# Patient Record
Sex: Female | Born: 1940 | Race: White | Hispanic: No | Smoking: Former smoker
Health system: Southern US, Community
[De-identification: ages and names within clinical notes are randomized; demographics above are authoritative.]

## PROBLEM LIST (undated history)

## (undated) DIAGNOSIS — K259 Gastric ulcer, unspecified as acute or chronic, without hemorrhage or perforation: Secondary | ICD-10-CM

## (undated) DIAGNOSIS — Z973 Presence of spectacles and contact lenses: Secondary | ICD-10-CM

## (undated) DIAGNOSIS — F329 Major depressive disorder, single episode, unspecified: Secondary | ICD-10-CM

## (undated) DIAGNOSIS — R200 Anesthesia of skin: Secondary | ICD-10-CM

## (undated) DIAGNOSIS — H269 Unspecified cataract: Secondary | ICD-10-CM

## (undated) DIAGNOSIS — F419 Anxiety disorder, unspecified: Secondary | ICD-10-CM

## (undated) DIAGNOSIS — T7840XA Allergy, unspecified, initial encounter: Secondary | ICD-10-CM

## (undated) DIAGNOSIS — Z5189 Encounter for other specified aftercare: Secondary | ICD-10-CM

## (undated) DIAGNOSIS — R202 Paresthesia of skin: Secondary | ICD-10-CM

## (undated) DIAGNOSIS — IMO0001 Reserved for inherently not codable concepts without codable children: Secondary | ICD-10-CM

## (undated) DIAGNOSIS — D649 Anemia, unspecified: Secondary | ICD-10-CM

## (undated) DIAGNOSIS — E785 Hyperlipidemia, unspecified: Secondary | ICD-10-CM

## (undated) DIAGNOSIS — Z8701 Personal history of pneumonia (recurrent): Secondary | ICD-10-CM

## (undated) DIAGNOSIS — Z87442 Personal history of urinary calculi: Secondary | ICD-10-CM

## (undated) DIAGNOSIS — F32A Depression, unspecified: Secondary | ICD-10-CM

## (undated) DIAGNOSIS — Z8709 Personal history of other diseases of the respiratory system: Secondary | ICD-10-CM

## (undated) DIAGNOSIS — K219 Gastro-esophageal reflux disease without esophagitis: Secondary | ICD-10-CM

## (undated) DIAGNOSIS — M1990 Unspecified osteoarthritis, unspecified site: Secondary | ICD-10-CM

## (undated) DIAGNOSIS — K819 Cholecystitis, unspecified: Secondary | ICD-10-CM

## (undated) DIAGNOSIS — I714 Abdominal aortic aneurysm, without rupture, unspecified: Secondary | ICD-10-CM

## (undated) DIAGNOSIS — I10 Essential (primary) hypertension: Secondary | ICD-10-CM

## (undated) DIAGNOSIS — I2510 Atherosclerotic heart disease of native coronary artery without angina pectoris: Secondary | ICD-10-CM

## (undated) HISTORY — DX: Depression, unspecified: F32.A

## (undated) HISTORY — PX: COLONOSCOPY: SHX174

## (undated) HISTORY — PX: SHOULDER ARTHROSCOPY W/ ROTATOR CUFF REPAIR: SHX2400

## (undated) HISTORY — DX: Unspecified osteoarthritis, unspecified site: M19.90

## (undated) HISTORY — DX: Major depressive disorder, single episode, unspecified: F32.9

## (undated) HISTORY — DX: Allergy, unspecified, initial encounter: T78.40XA

## (undated) HISTORY — PX: UPPER GASTROINTESTINAL ENDOSCOPY: SHX188

## (undated) HISTORY — DX: Hyperlipidemia, unspecified: E78.5

## (undated) HISTORY — DX: Gastric ulcer, unspecified as acute or chronic, without hemorrhage or perforation: K25.9

## (undated) HISTORY — PX: OTHER SURGICAL HISTORY: SHX169

## (undated) HISTORY — DX: Unspecified cataract: H26.9

## (undated) HISTORY — DX: Encounter for other specified aftercare: Z51.89

## (undated) HISTORY — PX: EYE SURGERY: SHX253

## (undated) HISTORY — DX: Essential (primary) hypertension: I10

---

## 1999-03-18 ENCOUNTER — Inpatient Hospital Stay (HOSPITAL_COMMUNITY): Admission: AD | Admit: 1999-03-18 | Discharge: 1999-03-25 | Payer: Self-pay | Admitting: Family Medicine

## 1999-03-19 ENCOUNTER — Encounter: Payer: Self-pay | Admitting: Family Medicine

## 1999-03-21 ENCOUNTER — Encounter: Payer: Self-pay | Admitting: *Deleted

## 1999-03-26 ENCOUNTER — Encounter: Admission: RE | Admit: 1999-03-26 | Discharge: 1999-03-26 | Payer: Self-pay | Admitting: Family Medicine

## 1999-04-05 ENCOUNTER — Encounter: Admission: RE | Admit: 1999-04-05 | Discharge: 1999-04-05 | Payer: Self-pay | Admitting: Family Medicine

## 1999-12-06 ENCOUNTER — Encounter: Admission: RE | Admit: 1999-12-06 | Discharge: 1999-12-06 | Payer: Self-pay | Admitting: Family Medicine

## 2000-04-20 ENCOUNTER — Encounter: Admission: RE | Admit: 2000-04-20 | Discharge: 2000-04-20 | Payer: Self-pay | Admitting: Family Medicine

## 2000-05-11 ENCOUNTER — Encounter: Payer: Self-pay | Admitting: *Deleted

## 2000-05-11 ENCOUNTER — Encounter: Admission: RE | Admit: 2000-05-11 | Discharge: 2000-05-11 | Payer: Self-pay | Admitting: *Deleted

## 2000-05-12 ENCOUNTER — Encounter: Admission: RE | Admit: 2000-05-12 | Discharge: 2000-08-10 | Payer: Self-pay | Admitting: *Deleted

## 2000-06-01 ENCOUNTER — Ambulatory Visit (HOSPITAL_COMMUNITY): Admission: RE | Admit: 2000-06-01 | Discharge: 2000-06-01 | Payer: Self-pay | Admitting: Gastroenterology

## 2000-06-01 ENCOUNTER — Encounter (INDEPENDENT_AMBULATORY_CARE_PROVIDER_SITE_OTHER): Payer: Self-pay | Admitting: *Deleted

## 2000-06-16 ENCOUNTER — Encounter: Admission: RE | Admit: 2000-06-16 | Discharge: 2000-06-16 | Payer: Self-pay | Admitting: Sports Medicine

## 2000-09-09 ENCOUNTER — Encounter: Admission: RE | Admit: 2000-09-09 | Discharge: 2000-09-09 | Payer: Self-pay | Admitting: Family Medicine

## 2000-12-31 ENCOUNTER — Encounter: Admission: RE | Admit: 2000-12-31 | Discharge: 2000-12-31 | Payer: Self-pay | Admitting: Family Medicine

## 2001-01-07 ENCOUNTER — Encounter: Admission: RE | Admit: 2001-01-07 | Discharge: 2001-01-07 | Payer: Self-pay | Admitting: Family Medicine

## 2001-02-08 ENCOUNTER — Encounter: Admission: RE | Admit: 2001-02-08 | Discharge: 2001-02-08 | Payer: Self-pay | Admitting: Family Medicine

## 2001-02-12 ENCOUNTER — Encounter: Admission: RE | Admit: 2001-02-12 | Discharge: 2001-02-12 | Payer: Self-pay | Admitting: Family Medicine

## 2001-03-29 ENCOUNTER — Encounter: Admission: RE | Admit: 2001-03-29 | Discharge: 2001-03-29 | Payer: Self-pay | Admitting: Family Medicine

## 2001-04-30 ENCOUNTER — Encounter: Admission: RE | Admit: 2001-04-30 | Discharge: 2001-04-30 | Payer: Self-pay | Admitting: Family Medicine

## 2001-05-14 ENCOUNTER — Encounter: Admission: RE | Admit: 2001-05-14 | Discharge: 2001-05-14 | Payer: Self-pay | Admitting: Family Medicine

## 2001-06-22 ENCOUNTER — Other Ambulatory Visit: Admission: RE | Admit: 2001-06-22 | Discharge: 2001-06-22 | Payer: Self-pay | Admitting: Obstetrics and Gynecology

## 2001-06-25 ENCOUNTER — Encounter (INDEPENDENT_AMBULATORY_CARE_PROVIDER_SITE_OTHER): Payer: Self-pay | Admitting: Specialist

## 2001-06-25 ENCOUNTER — Ambulatory Visit (HOSPITAL_COMMUNITY): Admission: RE | Admit: 2001-06-25 | Discharge: 2001-06-25 | Payer: Self-pay | Admitting: Gastroenterology

## 2001-10-04 ENCOUNTER — Encounter: Admission: RE | Admit: 2001-10-04 | Discharge: 2001-10-04 | Payer: Self-pay | Admitting: Family Medicine

## 2002-02-23 ENCOUNTER — Encounter: Admission: RE | Admit: 2002-02-23 | Discharge: 2002-02-23 | Payer: Self-pay | Admitting: Family Medicine

## 2002-06-14 ENCOUNTER — Encounter: Admission: RE | Admit: 2002-06-14 | Discharge: 2002-06-14 | Payer: Self-pay | Admitting: Family Medicine

## 2002-08-02 ENCOUNTER — Encounter: Admission: RE | Admit: 2002-08-02 | Discharge: 2002-08-02 | Payer: Self-pay | Admitting: Sports Medicine

## 2002-08-02 ENCOUNTER — Encounter: Payer: Self-pay | Admitting: Sports Medicine

## 2002-08-18 ENCOUNTER — Encounter: Admission: RE | Admit: 2002-08-18 | Discharge: 2002-08-18 | Payer: Self-pay | Admitting: Family Medicine

## 2003-02-13 ENCOUNTER — Encounter: Admission: RE | Admit: 2003-02-13 | Discharge: 2003-02-13 | Payer: Self-pay | Admitting: Family Medicine

## 2003-03-06 ENCOUNTER — Encounter: Admission: RE | Admit: 2003-03-06 | Discharge: 2003-03-06 | Payer: Self-pay | Admitting: Family Medicine

## 2003-07-06 ENCOUNTER — Encounter: Admission: RE | Admit: 2003-07-06 | Discharge: 2003-07-06 | Payer: Self-pay | Admitting: Family Medicine

## 2003-11-30 ENCOUNTER — Encounter: Admission: RE | Admit: 2003-11-30 | Discharge: 2003-11-30 | Payer: Self-pay | Admitting: Family Medicine

## 2003-11-30 ENCOUNTER — Ambulatory Visit (HOSPITAL_COMMUNITY): Admission: RE | Admit: 2003-11-30 | Discharge: 2003-11-30 | Payer: Self-pay | Admitting: Family Medicine

## 2004-02-07 ENCOUNTER — Encounter: Admission: RE | Admit: 2004-02-07 | Discharge: 2004-02-07 | Payer: Self-pay | Admitting: Sports Medicine

## 2004-02-21 ENCOUNTER — Encounter: Admission: RE | Admit: 2004-02-21 | Discharge: 2004-02-21 | Payer: Self-pay | Admitting: Family Medicine

## 2004-06-21 ENCOUNTER — Encounter: Admission: RE | Admit: 2004-06-21 | Discharge: 2004-06-21 | Payer: Self-pay | Admitting: Family Medicine

## 2004-06-24 ENCOUNTER — Encounter: Admission: RE | Admit: 2004-06-24 | Discharge: 2004-06-24 | Payer: Self-pay | Admitting: Family Medicine

## 2004-10-16 ENCOUNTER — Ambulatory Visit: Payer: Self-pay | Admitting: Family Medicine

## 2004-10-31 ENCOUNTER — Ambulatory Visit: Payer: Self-pay | Admitting: Family Medicine

## 2005-02-27 ENCOUNTER — Ambulatory Visit: Payer: Self-pay | Admitting: Family Medicine

## 2005-03-31 ENCOUNTER — Encounter (INDEPENDENT_AMBULATORY_CARE_PROVIDER_SITE_OTHER): Payer: Self-pay | Admitting: *Deleted

## 2005-03-31 LAB — CONVERTED CEMR LAB

## 2005-04-18 ENCOUNTER — Ambulatory Visit: Payer: Self-pay | Admitting: Family Medicine

## 2005-05-05 ENCOUNTER — Encounter: Admission: RE | Admit: 2005-05-05 | Discharge: 2005-05-05 | Payer: Self-pay | Admitting: Sports Medicine

## 2005-06-17 ENCOUNTER — Ambulatory Visit: Payer: Self-pay | Admitting: Family Medicine

## 2006-02-26 ENCOUNTER — Ambulatory Visit: Payer: Self-pay | Admitting: Family Medicine

## 2006-03-18 ENCOUNTER — Ambulatory Visit: Payer: Self-pay | Admitting: Family Medicine

## 2006-11-17 ENCOUNTER — Ambulatory Visit: Payer: Self-pay | Admitting: Sports Medicine

## 2007-01-29 ENCOUNTER — Encounter (INDEPENDENT_AMBULATORY_CARE_PROVIDER_SITE_OTHER): Payer: Self-pay | Admitting: *Deleted

## 2007-03-09 ENCOUNTER — Encounter (INDEPENDENT_AMBULATORY_CARE_PROVIDER_SITE_OTHER): Payer: Self-pay | Admitting: *Deleted

## 2007-03-09 ENCOUNTER — Ambulatory Visit: Payer: Self-pay | Admitting: Family Medicine

## 2007-03-09 DIAGNOSIS — I10 Essential (primary) hypertension: Secondary | ICD-10-CM | POA: Insufficient documentation

## 2007-03-09 DIAGNOSIS — E782 Mixed hyperlipidemia: Secondary | ICD-10-CM | POA: Insufficient documentation

## 2007-04-01 LAB — CONVERTED CEMR LAB
Albumin: 4.7 g/dL (ref 3.5–5.2)
Alkaline Phosphatase: 91 units/L (ref 39–117)
CO2: 22 meq/L (ref 19–32)
Chloride: 102 meq/L (ref 96–112)
LDL Cholesterol: 124 mg/dL — ABNORMAL HIGH (ref 0–99)
Sodium: 138 meq/L (ref 135–145)
VLDL: 59 mg/dL — ABNORMAL HIGH (ref 0–40)

## 2007-08-30 ENCOUNTER — Encounter (INDEPENDENT_AMBULATORY_CARE_PROVIDER_SITE_OTHER): Payer: Self-pay | Admitting: Family Medicine

## 2007-08-30 ENCOUNTER — Ambulatory Visit: Payer: Self-pay | Admitting: Sports Medicine

## 2007-08-30 DIAGNOSIS — K5730 Diverticulosis of large intestine without perforation or abscess without bleeding: Secondary | ICD-10-CM | POA: Insufficient documentation

## 2007-08-30 DIAGNOSIS — K219 Gastro-esophageal reflux disease without esophagitis: Secondary | ICD-10-CM | POA: Insufficient documentation

## 2007-08-30 DIAGNOSIS — G4700 Insomnia, unspecified: Secondary | ICD-10-CM | POA: Insufficient documentation

## 2007-08-30 DIAGNOSIS — F329 Major depressive disorder, single episode, unspecified: Secondary | ICD-10-CM | POA: Insufficient documentation

## 2007-08-31 LAB — CONVERTED CEMR LAB
Alkaline Phosphatase: 102 units/L (ref 39–117)
BUN: 13 mg/dL (ref 6–23)
CO2: 25 meq/L (ref 19–32)
Calcium: 9.6 mg/dL (ref 8.4–10.5)
Chloride: 103 meq/L (ref 96–112)
Creatinine, Ser: 0.59 mg/dL (ref 0.40–1.20)
Glucose, Bld: 85 mg/dL (ref 70–99)
Potassium: 4.1 meq/L (ref 3.5–5.3)
Sodium: 139 meq/L (ref 135–145)
Triglycerides: 428 mg/dL — ABNORMAL HIGH (ref <150)

## 2007-09-22 ENCOUNTER — Encounter (INDEPENDENT_AMBULATORY_CARE_PROVIDER_SITE_OTHER): Payer: Self-pay | Admitting: Family Medicine

## 2007-10-12 ENCOUNTER — Telehealth: Payer: Self-pay | Admitting: *Deleted

## 2007-10-13 ENCOUNTER — Ambulatory Visit: Payer: Self-pay | Admitting: Family Medicine

## 2007-11-08 ENCOUNTER — Ambulatory Visit: Payer: Self-pay | Admitting: Family Medicine

## 2007-12-08 ENCOUNTER — Ambulatory Visit: Payer: Self-pay | Admitting: Family Medicine

## 2007-12-08 ENCOUNTER — Encounter (INDEPENDENT_AMBULATORY_CARE_PROVIDER_SITE_OTHER): Payer: Self-pay | Admitting: Family Medicine

## 2007-12-08 DIAGNOSIS — M899 Disorder of bone, unspecified: Secondary | ICD-10-CM | POA: Insufficient documentation

## 2007-12-08 DIAGNOSIS — M949 Disorder of cartilage, unspecified: Secondary | ICD-10-CM

## 2007-12-09 LAB — CONVERTED CEMR LAB
Albumin: 4.6 g/dL (ref 3.5–5.2)
Alkaline Phosphatase: 77 units/L (ref 39–117)
BUN: 16 mg/dL (ref 6–23)
CO2: 25 meq/L (ref 19–32)
Calcium: 9.7 mg/dL (ref 8.4–10.5)
Chloride: 103 meq/L (ref 96–112)
Creatinine, Ser: 0.64 mg/dL (ref 0.40–1.20)
Sodium: 139 meq/L (ref 135–145)
Total Protein: 7.2 g/dL (ref 6.0–8.3)
Triglycerides: 212 mg/dL — ABNORMAL HIGH (ref <150)

## 2007-12-23 ENCOUNTER — Telehealth (INDEPENDENT_AMBULATORY_CARE_PROVIDER_SITE_OTHER): Payer: Self-pay | Admitting: Family Medicine

## 2007-12-29 ENCOUNTER — Encounter: Admission: RE | Admit: 2007-12-29 | Discharge: 2007-12-29 | Payer: Self-pay | Admitting: Internal Medicine

## 2008-01-01 ENCOUNTER — Encounter (INDEPENDENT_AMBULATORY_CARE_PROVIDER_SITE_OTHER): Payer: Self-pay | Admitting: Family Medicine

## 2008-01-20 ENCOUNTER — Encounter (INDEPENDENT_AMBULATORY_CARE_PROVIDER_SITE_OTHER): Payer: Self-pay | Admitting: Family Medicine

## 2008-02-07 ENCOUNTER — Ambulatory Visit: Payer: Self-pay | Admitting: Sports Medicine

## 2008-05-29 ENCOUNTER — Encounter (INDEPENDENT_AMBULATORY_CARE_PROVIDER_SITE_OTHER): Payer: Self-pay | Admitting: Family Medicine

## 2008-05-29 ENCOUNTER — Ambulatory Visit: Payer: Self-pay | Admitting: Family Medicine

## 2008-05-30 LAB — CONVERTED CEMR LAB
ALT: 14 units/L (ref 0–35)
AST: 19 units/L (ref 0–37)
Albumin: 4.5 g/dL (ref 3.5–5.2)
CO2: 26 meq/L (ref 19–32)
Creatinine, Ser: 0.54 mg/dL (ref 0.40–1.20)
LDL Cholesterol: 176 mg/dL — ABNORMAL HIGH (ref 0–99)
Sodium: 139 meq/L (ref 135–145)
Total CHOL/HDL Ratio: 6.2
Total Protein: 7.4 g/dL (ref 6.0–8.3)
Triglycerides: 295 mg/dL — ABNORMAL HIGH (ref <150)
VLDL: 59 mg/dL — ABNORMAL HIGH (ref 0–40)

## 2008-07-24 ENCOUNTER — Telehealth: Payer: Self-pay | Admitting: *Deleted

## 2008-07-24 ENCOUNTER — Ambulatory Visit: Payer: Self-pay | Admitting: Sports Medicine

## 2008-08-30 ENCOUNTER — Telehealth (INDEPENDENT_AMBULATORY_CARE_PROVIDER_SITE_OTHER): Payer: Self-pay | Admitting: Family Medicine

## 2008-09-18 ENCOUNTER — Telehealth (INDEPENDENT_AMBULATORY_CARE_PROVIDER_SITE_OTHER): Payer: Self-pay | Admitting: *Deleted

## 2009-03-23 ENCOUNTER — Encounter (INDEPENDENT_AMBULATORY_CARE_PROVIDER_SITE_OTHER): Payer: Self-pay | Admitting: Family Medicine

## 2009-03-23 ENCOUNTER — Ambulatory Visit: Payer: Self-pay | Admitting: Family Medicine

## 2009-03-23 LAB — CONVERTED CEMR LAB
AST: 20 units/L (ref 0–37)
Albumin: 4 g/dL (ref 3.5–5.2)
BUN: 17 mg/dL (ref 6–23)
Chloride: 104 meq/L (ref 96–112)
Creatinine, Ser: 0.56 mg/dL (ref 0.40–1.20)
Total Bilirubin: 0.2 mg/dL — ABNORMAL LOW (ref 0.3–1.2)

## 2009-03-26 ENCOUNTER — Encounter (INDEPENDENT_AMBULATORY_CARE_PROVIDER_SITE_OTHER): Payer: Self-pay | Admitting: Family Medicine

## 2009-04-04 ENCOUNTER — Encounter (INDEPENDENT_AMBULATORY_CARE_PROVIDER_SITE_OTHER): Payer: Self-pay | Admitting: Family Medicine

## 2009-04-09 ENCOUNTER — Encounter (INDEPENDENT_AMBULATORY_CARE_PROVIDER_SITE_OTHER): Payer: Self-pay | Admitting: Family Medicine

## 2009-08-13 ENCOUNTER — Ambulatory Visit: Payer: Self-pay | Admitting: Family Medicine

## 2009-10-23 ENCOUNTER — Ambulatory Visit: Payer: Self-pay | Admitting: Family Medicine

## 2009-10-24 ENCOUNTER — Telehealth: Payer: Self-pay | Admitting: Family Medicine

## 2009-10-29 ENCOUNTER — Ambulatory Visit: Payer: Self-pay | Admitting: Family Medicine

## 2009-10-29 LAB — CONVERTED CEMR LAB: Rapid Strep: NEGATIVE

## 2010-01-30 ENCOUNTER — Ambulatory Visit: Payer: Self-pay | Admitting: Family Medicine

## 2010-01-30 DIAGNOSIS — M1990 Unspecified osteoarthritis, unspecified site: Secondary | ICD-10-CM | POA: Insufficient documentation

## 2010-07-25 ENCOUNTER — Ambulatory Visit: Payer: Self-pay | Admitting: Family Medicine

## 2010-07-25 ENCOUNTER — Telehealth: Payer: Self-pay | Admitting: *Deleted

## 2010-07-25 ENCOUNTER — Encounter: Payer: Self-pay | Admitting: Family Medicine

## 2010-07-25 LAB — CONVERTED CEMR LAB
ALT: 13 units/L (ref 0–35)
AST: 28 units/L (ref 0–37)
Albumin: 4.5 g/dL (ref 3.5–5.2)
Alkaline Phosphatase: 79 units/L (ref 39–117)
BUN: 16 mg/dL (ref 6–23)
CO2: 24 meq/L (ref 19–32)
Calcium: 8.9 mg/dL (ref 8.4–10.5)
Chloride: 103 meq/L (ref 96–112)
Cholesterol: 202 mg/dL — ABNORMAL HIGH (ref 0–200)
Creatinine, Ser: 0.6 mg/dL (ref 0.40–1.20)
Glucose, Bld: 89 mg/dL (ref 70–99)
HDL: 43 mg/dL (ref >39)
LDL Cholesterol: 117 mg/dL — ABNORMAL HIGH (ref 0–99)
Potassium: 4.4 meq/L (ref 3.5–5.3)
Sodium: 141 meq/L (ref 135–145)
TSH: 2.632 microintl units/mL (ref 0.350–4.500)
Total Bilirubin: 0.3 mg/dL (ref 0.3–1.2)
Total CHOL/HDL Ratio: 4.7
Total Protein: 7 g/dL (ref 6.0–8.3)
Triglycerides: 211 mg/dL — ABNORMAL HIGH (ref <150)
VLDL: 42 mg/dL — ABNORMAL HIGH (ref 0–40)
Vit D, 25-Hydroxy: 39 ng/mL (ref 30–89)

## 2010-07-28 ENCOUNTER — Encounter: Payer: Self-pay | Admitting: Family Medicine

## 2010-11-10 ENCOUNTER — Telehealth: Payer: Self-pay | Admitting: Family Medicine

## 2010-12-31 NOTE — Assessment & Plan Note (Signed)
 Summary: not any better,df    Vital Signs:  Patient profile:   70 year old female Weight:      147 pounds Temp:     98 degrees F oral Pulse rate:   97 / minute Pulse rhythm:   regular BP sitting:   173 / 87  (right arm) Cuff size:   regular  Vitals Entered By: Bascom Pica CMA (October 29, 2009 9:04 AM) CC: throat pain x 1 week,    Primary Care Provider:  Geni Shutter DO  CC:  throat pain x 1 week and .  History of Present Illness: ST x 1 week, cough, greenish mucus, no itchy eyes, no ear pain, no changes in hearing, no pain over sinuses, no fevers, generalized malaise, body aches, able to tolerate by mouth fluids.  Tearful, worried she is going to die.  No SOB, no wheeze, no chills.  No N/V/D/C.  Requesting antibiotics.  No neck pain/stiffness, no visual changes.  Current Medications (verified): 1)  Zoloft  50 Mg  Tabs (Sertraline  Hcl) .SABRA.. 1 Tablet By Mouth Daily 2)  Prilosec 20 Mg  Cpdr (Omeprazole ) .SABRA.. 1 Tablet By Mouth Two Times A Day As Needed 3)  Hydrochlorothiazide  25 Mg  Tabs (Hydrochlorothiazide ) .SABRA.. 1 Tablet By Mouth Daily 4)  Enalapril  Maleate 5 Mg  Tabs (Enalapril  Maleate) .SABRA.. 1 Tablet By Mouth Daily 5)  Bayer Low Strength 81 Mg  Tbec (Aspirin ) .SABRA.. 1 Tablet By Mouth Daily 6)  Trazodone  Hcl 100 Mg  Tabs (Trazodone  Hcl) .SABRA.. 1 Tablet By Mouth At Bedtime As Needed For Sleep 7)  Simvastatin  80 Mg  Tabs (Simvastatin ) .SABRA.. 1 Tablet By Mouth Daily 8)  Zoloft  100 Mg Tabs (Sertraline  Hcl) .... Take 1 Tablet By Mouth Daily With 50 Mg Tablet= 150mg  9)  Ativan  0.5 Mg Tabs (Lorazepam ) .... 1/2 To 1 Tablet By Mouth Daily As Needed For Anxiety 10)  Proair  Hfa 108 (90 Base) Mcg/act Aers (Albuterol  Sulfate) .... Two Puffs Qid As Needed For Cough and Wheeze 11)  Mucinex  D [phone] Mg Xr12h-Tab (Pseudoephedrine-Guaifenesin ) .... One Tab By Mouth Bid 12)  Tessalon  Perles 100 Mg Caps (Benzonatate ) .... One Tab Po Tid 13)  Tylenol  8 Hour 650 Mg Cr-Tabs (Acetaminophen ) .... 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  No longer working (quit early 2007).  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 30 pack year smoking history; quit 4/00. No etoh or drug use.  4 children who all live in the area; 2 grandchildren.  Son in [place], [place] in halfway house.  Review of Systems       See HPI  Physical Exam  General:  Well-developed,well-nourished,in no acute distress; alert,appropriate and cooperative throughout examination Head:  Normocephalic and atraumatic without obvious abnormalities. Eyes:  No corneal or conjunctival inflammation noted. EOMI. Perrla. Ears:  External ear exam shows no significant lesions or deformities.  Otoscopic examination reveals  clear canals, tympanic membranes are intact bilaterally without bulging, retraction, inflammation or discharge. Hearing is grossly normal bilaterally. Nose:  External nasal examination shows no deformity or inflammation. Nasal mucosa are mildly erythematous and moist without lesions or exudates. Mouth:  Oral mucosa and oropharynx without lesions or exudates.  Dentures removed prior to exam. Neck:  No deformities, masses, or tenderness noted. Chest Wall:  No deformities, masses, or tenderness noted. Lungs:  Normal respiratory effort, chest  expands symmetrically. Lungs are clear to auscultation, no crackles or wheezes. Heart:  Normal rate and regular rhythm. S1 and S2 normal without gallop, murmur, click, rub or other extra sounds. Abdomen:  Bowel sounds positive,abdomen soft and non-tender without masses, organomegaly or hernias noted.   Impression & Recommendations:  Problem # 1:  SORE THROAT (ICD-462) Assessment New Viral syndrome, strep neg, Tylenol , tessalon  perles, mucinex  D short course.  Hydration.  RTC if no better in 2 weeks.  Her updated medication list for this problem includes:    Tylenol  8 Hour 650 Mg Cr-tabs (Acetaminophen ) ..... One tab by mouth q8h as needed for discomfort  Orders: Rapid Strep-[facility] (12569) [facility]- Est Level  3 (00786)  Her updated medication list for this problem includes:    Bayer Low Strength 81 Mg Tbec (Aspirin ) .SABRA... 1 tablet by mouth daily    Tylenol  8 Hour 650 Mg Cr-tabs (Acetaminophen ) ..... One tab by mouth q8h as needed for discomfort  Complete Medication List: 1)  Zoloft  50 Mg Tabs (Sertraline  hcl) .SABRA.. 1 tablet by mouth daily 2)  Prilosec 20 Mg Cpdr (Omeprazole ) .SABRA.. 1 tablet by mouth two times a day as needed 3)  Hydrochlorothiazide  25 Mg Tabs (Hydrochlorothiazide ) .SABRA.. 1 tablet by mouth daily 4)  Enalapril  Maleate 5 Mg Tabs (Enalapril  maleate) .SABRA.. 1 tablet by mouth daily 5)  Bayer Low Strength 81 Mg Tbec (Aspirin ) .SABRA.. 1 tablet by mouth daily 6)  Trazodone  Hcl 100 Mg Tabs (Trazodone  hcl) .SABRA.. 1 tablet by mouth at bedtime as needed for sleep 7)  Simvastatin  80 Mg Tabs (Simvastatin ) .SABRA.. 1 tablet by mouth daily 8)  Zoloft  100 Mg Tabs (Sertraline  hcl) .... Take 1 tablet by mouth daily with 50 mg tablet= 150mg  9)  Ativan  0.5 Mg Tabs (Lorazepam ) .... 1/2 to 1 tablet by mouth daily as needed for anxiety 10)  Proair  Hfa 108 (90 Base) Mcg/act Aers (Albuterol  sulfate) .... Two puffs qid as needed for cough and wheeze 11)  Mucinex  D [phone] Mg Xr12h-tab (Pseudoephedrine-guaifenesin )  .... One tab by mouth bid 12)  Tessalon  Perles 100 Mg Caps (Benzonatate ) .... One tab po tid 13)  Tylenol  8 Hour 650 Mg Cr-tabs (Acetaminophen ) .... One tab by mouth q8h as needed for discomfort  Patient Instructions: 1)  Great to meet you today, 2)  You do not have strep throat, you have a viral infection, these can take 2-3 weeks to completely resolve.  Keep yourself well hydrated and take the prescribed medications.  Come back if you become unable to take fluids, vomiting, diarrhea. 3)  -Dr. ONEIDA. Prescriptions: TYLENOL  8 HOUR 650 MG CR-TABS (ACETAMINOPHEN ) One tab by mouth q8h as needed for discomfort  #30 x 0   Entered and Authorized by:   Debby Petties MD   Signed by:   Debby Petties MD on 10/29/2009   Method used:   Electronically to        [facility] [phone]* (retail)       [address]  [place], [place]  [address]       Ph: [phone]       Fax: [phone]   RxID:   [id number] TESSALON  PERLES 100 MG CAPS (BENZONATATE ) One tab Po TID  #30 x 0   Entered and Authorized by:   Debby Petties MD   Signed by:   Debby Petties MD on 10/29/2009   Method used:   Electronically to        [facility] [phone]* (retail)       [address]       [place], [place]  [address]       Ph: [phone]       Fax: [phone]   RxID:   [id number] MUCINEX  D [phone] MG XR12H-TAB (PSEUDOEPHEDRINE-GUAIFENESIN ) One tab by mouth BID  #20 x 0   Entered and Authorized by:   Debby Petties MD   Signed by:   Debby Petties MD on 10/29/2009   Method used:   Electronically to        [facility] [phone]* (retail)       [address]       [place], [place]  [address]       Ph: [phone]       Fax: [phone]   RxID:   [id number]   Laboratory Results  Date/Time Received: October 29, 2009 9:08 AM  Date/Time Reported: October 29, 2009 9:25 AM   Other Tests  Rapid Strep: negative Comments: ...........test performed  by...........SABRAArland Morel, CMA     Prevention & Chronic Care Immunizations   Influenza vaccine: Fluvax 3+  (08/30/2007)   Influenza vaccine due: 08/29/2008    Tetanus booster: 10/01/2001: Done.   Tetanus booster due: 10/02/2011    Pneumococcal vaccine: Pneumovax (Medicare)  (12/08/2007)   Pneumococcal vaccine due: None    H. zoster vaccine: Not documented  Colorectal Screening   Hemoccult: negative x 3  (09/22/2007)   Hemoccult due: Not Indicated    Colonoscopy: Done.  (05/06/2005)   Colonoscopy due: 05/07/2015  Other Screening   Pap smear: Done.  (03/31/2005)   Pap smear due: 03/31/2006    Mammogram: No specific mammographic evidence of malignancy.  Assessment: BIRADS 1.   (01/01/2008)   Mammogram action/deferral: Screening mammogram in 1 year.     (01/01/2008)   Mammogram due: 12/31/2008    DXA bone density scan: Hip Total: T Score -2.5 to -1.0 Hip.    Location:  [facility].      (12/29/2007)   DXA scan due: 01/2010    Smoking status: quit  (10/23/2009)  Lipids   Total Cholesterol: 280  (05/29/2008)   LDL: 176  (05/29/2008)   LDL Direct: 82  (03/23/2009)   HDL: 45  (05/29/2008)   Triglycerides: 295  (05/29/2008)    SGOT (AST): 20  (03/23/2009)   SGPT (ALT): 14  (03/23/2009)   Alkaline phosphatase: 76  (03/23/2009)   Total bilirubin: 0.2  (03/23/2009)  Hypertension   Last Blood Pressure: 173 / 87  (10/29/2009)   Serum creatinine: 0.56  (03/23/2009)   Serum potassium 4.3  (03/23/2009)  Self-Management Support :    Hypertension self-management support: Not documented    Lipid self-management support: Not documented

## 2010-12-31 NOTE — Progress Notes (Signed)
Summary: Rx Prob  Phone Note Call from Patient Call back at [facility] Phone [phone]   Caller: Patient Summary of Call: The rx sent in this am is to expensive for pt can something else be called in?  Pharmacy is [facility] [address]  The rx is the hand gel. Initial call taken by: Clydell Hakim,  July 25, 2010 11:14 AM  Follow-up for Phone Call        will forward to MD. Follow-up by: Theresia Lo RN,  July 25, 2010 12:40 PM  Additional Follow-up for Phone Call Additional follow up Details #1::        Rx high dose Ibuprofen to take with food. May use Vicodin (already given) sparinglt for severe pain. Additional Follow-up by: Helane Rima DO,  July 25, 2010 5:15 PM    Additional Follow-up for Phone Call Additional follow up Details #2::    called pt informed of Rx at pharmacy Follow-up by: Tessie Fass CMA,  July 26, 2010 11:58 AM  New/Updated Medications: IBUPROFEN 800 MG TABS (IBUPROFEN) one by mouth two times a day as needed for pain - TAKE WITH FOOD Prescriptions: IBUPROFEN 800 MG TABS (IBUPROFEN) one by mouth two times a day as needed for pain - TAKE WITH FOOD  #60 x 3   Entered and Authorized by:   Helane Rima DO   Signed by:   Helane Rima DO on 07/25/2010   Method used:   Electronically to        [facility] [phone]* (retail)       [address]       [place], [place]  [address]       Ph: [phone]       Fax: [phone]   RxID:   [id number]

## 2010-12-31 NOTE — Assessment & Plan Note (Signed)
Summary: f/u,df   Vital Signs:  Patient profile:   70 year old female Height:      62 inches Weight:      150 pounds BMI:     27.53 Temp:     98.4 degrees F oral Pulse rate:   80 / minute BP sitting:   127 / 68  (left arm) Cuff size:   regular  Vitals Entered By: Jimmy Footman, CMA (July 25, 2010 8:58 AM) CC: med f/u, multiple issues Is Patient Diabetic? No Pain Assessment Patient in pain? yes     Location: right hand Type: aching   Primary Care Provider:  Helane Rima DO  CC:  med f/u and multiple issues.  History of Present Illness: Pleasant 70 yo WF:  1. HTN: Rx HCTZ, Enalapril, ASA. Denies CP, SOB, N/V/D, HA, dizziness, vision changes, LE edema.  2. HLD: Rx Simvastatin. LDL = 82 (4/10). Fasting today.  3. OA: c/o arthritis pain in multiple joints, worst in bilateral hands, does not keep her from doing daily activities. Rx Mobic, prescribed at last visit, did not help. Now, she has some mild numbness/tingling in her 4th and 5th digits. No weakness or increased pain.  4. Depression: Rx Zoloft. She has been on a stable dose for 11 years. She is having some increased depression lately 2/2 her daughter and grand-daughter being at her home. No SI/HI.  5. Insomnia: Rx Trazodone. Worsened. Patient states that it is easy to go to sleep but she wakes several times during the night. Reasons: racing thoughts, sometimes pain, sometimes to urinate.   Allergies (verified): No Known Drug Allergies PMH-FH-SH reviewed for relevance  Review of Systems General:  Denies chills and fever. MS:  Complains of joint pain and joint swelling; denies joint redness, loss of strength, and muscle aches. Neuro:  Complains of numbness and tingling. Psych:  Complains of anxiety and depression; denies panic attacks, suicidal thoughts/plans, and thoughts /plans of harming others.  Physical Exam  General:  Well-developed,well-nourished, in no acute distress; alert, appropriate and cooperative  throughout examination. Vitals reviewed. Lungs:  Normal respiratory effort, chest expands symmetrically. Lungs are clear to auscultation, no crackles or wheezes. Heart:  Normal rate and regular rhythm. S1 and S2 normal without gallop, murmur, click, rub or other extra sounds. Msk:  Right Hand: 5th digit with arthritis swelling over DIP and PIP. No swelling, obvious abnormality, or TTP over ulnar side of wrist. Negative Tinel/Phalens (modified). Normal ROM of wrist and elbow. Negative compression signs at elbow. Note: mildly tender nodule at palmer base of 4th digit - finger with easy flexion/extenion. Pulses:  R and L radial, dorsalis pedis, and posterior tibial pulses are full and equal bilaterally. Extremities:  No edema. Neurologic:  Alert & oriented X3, strength normal in all extremities, sensation intact to light touch, and DTRs symmetrical and normal.   Skin:  Intact without suspicious lesions or rashes. Psych:  Oriented X3, memory intact for recent and remote, normally interactive, good eye contact, and not anxious appearing.     Impression & Recommendations:  Problem # 1:  HYPERTENSION, BENIGN ESSENTIAL (ICD-401.1) Assessment Unchanged  Her updated medication list for this problem includes:    Hydrochlorothiazide 25 Mg Tabs (Hydrochlorothiazide) .Marland Kitchen... 1 tablet by mouth daily    Enalapril Maleate 5 Mg Tabs (Enalapril maleate) .Marland Kitchen... 1 tablet by mouth daily  Orders: Comp Met-[facility] (14782-95621) [facility]- Est  Level 4 (30865)  Problem # 2:  HYPERLIPIDEMIA, MIXED (ICD-272.2) Assessment: Unchanged  Her updated medication list for  this problem includes:    Simvastatin 80 Mg Tabs (Simvastatin) .Marland Kitchen... 1 tablet by mouth daily  Orders: Lipid-[facility] (04540-98119) [facility]- Est  Level 4 (14782)  Problem # 3:  DEGENERATIVE JOINT DISEASE (ICD-715.90) Assessment: Deteriorated Numbness and tingling likely from compression of ulnar nerve at wrist versus elbow. Discussed mechanical changes to decrease  compression at wrist. Continue Ibuprofen as needed for pain. Vicodin SPARINGLY for SEVER pain. Will not Rx Ultram as patient is already on a high dose of Zoloft. Rx topical NSAID too expensive. Rx splint to be sorn at night to decrease compression. Patient to f/u if symptoms do not improve or worsen in 4 weeks. Her updated medication list for this problem includes:    Bayer Low Strength 81 Mg Tbec (Aspirin) .Marland Kitchen... 1 tablet by mouth daily    Vicodin 5-500 Mg Tabs (Hydrocodone-acetaminophen) .Marland Kitchen... 1/2 to 1 by mouth q 4 hours as needed for severe pain    Ibuprofen 800 Mg Tabs (Ibuprofen) ..... One by mouth two times a day as needed for pain - take with food  Orders: Wrist splint cock up- [facility] (N5621) [facility]- Est  Level 4 (30865)  Problem # 4:  DISORDER, DEPRESSIVE NEC (ICD-311) Assessment: Deteriorated  Advised patient to discuss her feeling s of frustration with her family members. Offered counseling. Her updated medication list for this problem includes:    Zoloft 50 Mg Tabs (Sertraline hcl) .Marland Kitchen... 1 tablet by mouth daily    Trazodone Hcl 100 Mg Tabs (Trazodone hcl) .Marland Kitchen... 1 tablet by mouth at bedtime as needed for sleep    Zoloft 100 Mg Tabs (Sertraline hcl) .Marland Kitchen... Take 1 tablet by mouth daily with 50 mg tablet= 150mg   Orders: [facility]- Est  Level 4 (99214)  Problem # 5:  INSOMNIA, CHRONIC (ICD-307.42) Assessment: Deteriorated  Discussed sleep hygeine. Recommended Melatonin. Continue Trazodone. Addressing pain and depression should also help with sleep.  Orders: [facility]- Est  Level 4 (99214)  Complete Medication List: 1)  Zoloft 50 Mg Tabs (Sertraline hcl) .Marland Kitchen.. 1 tablet by mouth daily 2)  Prilosec 20 Mg Cpdr (Omeprazole) .Marland Kitchen.. 1 tablet by mouth two times a day as needed 3)  Hydrochlorothiazide 25 Mg Tabs (Hydrochlorothiazide) .Marland Kitchen.. 1 tablet by mouth daily 4)  Enalapril Maleate 5 Mg Tabs (Enalapril maleate) .Marland Kitchen.. 1 tablet by mouth daily 5)  Bayer Low Strength 81 Mg Tbec (Aspirin) .Marland Kitchen.. 1 tablet by mouth  daily 6)  Trazodone Hcl 100 Mg Tabs (Trazodone hcl) .Marland Kitchen.. 1 tablet by mouth at bedtime as needed for sleep 7)  Simvastatin 80 Mg Tabs (Simvastatin) .Marland Kitchen.. 1 tablet by mouth daily 8)  Zoloft 100 Mg Tabs (Sertraline hcl) .... Take 1 tablet by mouth daily with 50 mg tablet= 150mg  9)  Proair Hfa 108 (90 Base) Mcg/act Aers (Albuterol sulfate) .... Two puffs qid as needed for cough and wheeze 10)  Vicodin 5-500 Mg Tabs (Hydrocodone-acetaminophen) .... 1/2 to 1 by mouth q 4 hours as needed for severe pain 11)  Voltaren 1 % Gel (Diclofenac sodium) .... Apply two grams topically to right hand four times daily 12)  Melatonin 1 Mg Caps (Melatonin) .... Take one capsule by mouth at bedtime.  may take up to three capsules at bedtime if one isn't enough. 13)  Ibuprofen 800 Mg Tabs (Ibuprofen) .... One by mouth two times a day as needed for pain - take with food  Other Orders: TSH-[facility] ([id number]) Vit D, 25 OH-[facility] ([id number])  Patient Instructions: 1)  It was nice to see you  today!  2)  I am prescribing Voltaren cream for your wrist pain. You may use 1/2 tab of Vicodin for SEVERE PAIN. Wear the wrist brace at night. 3)  Try adding Melatonin to your sleep regimen. Prescriptions: VICODIN 5-500 MG TABS (HYDROCODONE-ACETAMINOPHEN) 1/2 to 1 by mouth q 4 hours as needed for SEVERE pain  #30 x 0   Entered and Authorized by:   Helane Rima DO   Signed by:   Helane Rima DO on 07/25/2010   Method used:   Print then Give to Patient   RxID:   [id number] SIMVASTATIN 80 MG  TABS (SIMVASTATIN) 1 tablet by mouth daily  #90 Tablet x 3   Entered and Authorized by:   Helane Rima DO   Signed by:   Helane Rima DO on 07/25/2010   Method used:   Electronically to        [facility] [phone]* (retail)       [address]       [place], [place]  [address]       Ph: [phone]       Fax: [phone]   RxID:   [id number] MELATONIN 1 MG CAPS (MELATONIN) Take one capsule by mouth at bedtime.  May take  up to three capsules at bedtime if one isn't enough.  #1 x 0   Entered and Authorized by:   Helane Rima DO   Signed by:   Helane Rima DO on 07/25/2010   Method used:   Electronically to        [facility] [phone]* (retail)       [address]       [place], [place]  [address]       Ph: [phone]       Fax: [phone]   RxID:   [id number] VOLTAREN 1 % GEL (DICLOFENAC SODIUM) Apply two grams topically to right hand four times daily  #300 x 0   Entered and Authorized by:   Helane Rima DO   Signed by:   Helane Rima DO on 07/25/2010   Method used:   Electronically to        [facility] [phone]* (retail)       [address]       [place], [place]  [address]       Ph: [phone]       Fax: [phone]   RxID:   [id number] VICODIN 5-500 MG TABS (HYDROCODONE-ACETAMINOPHEN) 1/2 to 1 by mouth q 4 hours as needed for SEVERE pain  #30 x 0   Entered and Authorized by:   Helane Rima DO   Signed by:   Helane Rima DO on 07/25/2010   Method used:   Print then Give to Patient   RxID:   [id number]   Prevention & Chronic Care Immunizations   Influenza vaccine: Fluvax 3+  (08/30/2007)   Influenza vaccine deferral: Deferred  (07/25/2010)   Influenza vaccine due: 08/29/2008    Tetanus booster: 10/01/2001: Done.   Tetanus booster due: 10/02/2011    Pneumococcal vaccine: Pneumovax (Medicare)  (12/08/2007)   Pneumococcal vaccine due: None    H. zoster vaccine: Not documented  Colorectal Screening   Hemoccult: negative x 3  (09/22/2007)   Hemoccult due: Not Indicated    Colonoscopy: Done.  (05/06/2005)   Colonoscopy due: 05/07/2015  Other Screening   Pap smear: Done.  (03/31/2005)   Pap smear due: 03/31/2006    Mammogram: No specific mammographic evidence of malignancy.  Assessment: BIRADS  1.   (01/01/2008)   Mammogram action/deferral: Screening mammogram in 1 year.     (01/01/2008)   Mammogram due: 12/31/2008    DXA bone density scan: Hip  Total: T Score -2.5 to -1.0 Hip.    Location:  [facility].      (12/29/2007)   DXA scan due: 01/2010    Smoking status: quit > 6 months  (01/30/2010)  Lipids   Total Cholesterol: 280  (05/29/2008)   LDL: 176  (05/29/2008)   LDL Direct: 82  (03/23/2009)   HDL: 45  (05/29/2008)   Triglycerides: 295  (05/29/2008)    SGOT (AST): 20  (03/23/2009)   SGPT (ALT): 14  (03/23/2009) CMP ordered    Alkaline phosphatase: 76  (03/23/2009)   Total bilirubin: 0.2  (03/23/2009)    Lipid flowsheet reviewed?: Yes   Progress toward LDL goal: Unchanged  Hypertension   Last Blood Pressure: 127 / 68  (07/25/2010)   Serum creatinine: 0.56  (03/23/2009)   Serum potassium 4.3  (03/23/2009) CMP ordered     Hypertension flowsheet reviewed?: Yes   Progress toward BP goal: At goal  Self-Management Support :   Personal Goals (by the next clinic visit) :      Personal blood pressure goal: 140/90  (07/25/2010)     Personal LDL goal: 100  (07/25/2010)    Patient will work on the following items until the next clinic visit to reach self-care goals:     Medications and monitoring: take my medicines every day, bring all of my medications to every visit  (07/25/2010)     Eating: drink diet soda or water instead of juice or soda, eat more vegetables, use fresh or frozen vegetables, eat foods that are low in salt, eat baked foods instead of fried foods, eat fruit for snacks and desserts, limit or avoid alcohol  (07/25/2010)     Activity: take a 30 minute walk every day  (07/25/2010)    Hypertension self-management support: Written self-care plan  (07/25/2010)   Hypertension self-care plan printed.    Lipid self-management support: Written self-care plan  (07/25/2010)   Lipid self-care plan printed.

## 2010-12-31 NOTE — Letter (Signed)
Summary: Generic Letter  [facility]  [address]   [place], [place] [address]   Phone: [phone]  Fax: [phone]    07/28/2010  Nicole Dean [address] [place], [place]  [address]  Dear Ms. Fritze,  I am happy to inform you that the results of your recent labs are all normal.  Sincerely,   Helane Rima DO  Appended Document: Generic Letter mailed

## 2010-12-31 NOTE — Assessment & Plan Note (Signed)
Summary: med ck,tcb   Vital Signs:  Patient profile:   70 year old female Height:      62 inches Weight:      152 pounds BMI:     27.90 Temp:     98.4 degrees F Pulse rate:   92 / minute BP sitting:   129 / 77  Vitals Entered By: Golden Circle RN (January 30, 2010 1:51 PM)  Primary Care Provider:  Helane Rima DO  CC:  meet new doc and medication refills.  History of Present Illness: Pleasant 70 yo WF:  1. HTN: Rx HCTZ, Enalapril, ASA. Denies CP, SOB, N/V/D, HA, dizziness, vision changes, LE edema.  2. HLD: Rx Simvastatin. LDL =82 (4/10).  3. OA: c/o arthritis pain in multiple joints, worst in bilateral hands, does not keep her from doing daily activities. Uses Ibuprofen for pain with some relief.  4. Depression: Rx Zoloft. Controlled.  5. GERD: Rx Prilosec. Controlled.  6. Insomnia: Rx Trazodone. Controlled.  Habits & Providers  Alcohol-Tobacco-Diet     Alcohol drinks/day: 0     Tobacco Status: quit > 6 months     Year Quit: 2000  Exercise-Depression-Behavior     Does Patient Exercise: no     Have you felt down or hopeless? no     Have you felt little pleasure in things? yes     Depression Counseling: further diagnostic testing and/or other treatment is indicated     STD Risk: current     Drug Use: never     Seat Belt Use: always     Sun Exposure: frequent     Sun Exposure Counseling: yes  Current Medications (verified): 1)  Zoloft 50 Mg  Tabs (Sertraline Hcl) .Marland Kitchen.. 1 Tablet By Mouth Daily 2)  Prilosec 20 Mg  Cpdr (Omeprazole) .Marland Kitchen.. 1 Tablet By Mouth Two Times A Day As Needed 3)  Hydrochlorothiazide 25 Mg  Tabs (Hydrochlorothiazide) .Marland Kitchen.. 1 Tablet By Mouth Daily 4)  Enalapril Maleate 5 Mg  Tabs (Enalapril Maleate) .Marland Kitchen.. 1 Tablet By Mouth Daily 5)  Bayer Low Strength 81 Mg  Tbec (Aspirin) .Marland Kitchen.. 1 Tablet By Mouth Daily 6)  Trazodone Hcl 100 Mg  Tabs (Trazodone Hcl) .Marland Kitchen.. 1 Tablet By Mouth At Bedtime As Needed For Sleep 7)  Simvastatin 80 Mg  Tabs (Simvastatin) .Marland Kitchen..  1 Tablet By Mouth Daily 8)  Zoloft 100 Mg Tabs (Sertraline Hcl) .... Take 1 Tablet By Mouth Daily With 50 Mg Tablet= 150mg  9)  Proair Hfa 108 (90 Base) Mcg/act Aers (Albuterol Sulfate) .... Two Puffs Qid As Needed For Cough and Wheeze 10)  Mobic 7.5 Mg Tabs (Meloxicam) .... One By Mouth Daily With Food  Allergies (verified): No Known Drug Allergies  Past History:  Past Medical History: Menopause in 70s Colonoscopy: 05/2005- pandiverticulosis Endoscopy: gastritis - 06/15/2000 Hospitalized for pneumonia- 03/1999 (quit smoking after this hospitalization) PFTs:  FVC 95%, FEV1 101%, FEV /FVC 74% - 03/02/1999 Depression HTN HLD Insomnia Degenerativ Joint Disease Osteopenia PMH-FH-SH reviewed for relevance  Family History: Father died of cancer - unknown Mother - 51yo; CVA, AMI in 71s  Social History: Divorced x 20 years; lives alone.  No longer working (quit early 2007).  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 30 pack year smoking history; quit 4/00. No etoh or drug use. 4 children who all live in the area; 4 grandchildren. Smoking Status:  quit > 6 months Does Patient Exercise:  no Drug Use:  never STD Risk:  current Seat Belt Use:  always Sun Exposure-Excessive:  frequent  Review of Systems General:  Denies chills and fever. CV:  Denies chest pain or discomfort, shortness of breath with exertion, swelling of feet, and swelling of hands. Resp:  Denies cough, shortness of breath, sputum productive, and wheezing. GI:  Denies abdominal pain, constipation, and diarrhea. GU:  Denies dysuria. MS:  Complains of joint pain and low back pain; denies joint redness and joint swelling. Derm:  Denies rash. Neuro:  Denies numbness and tingling. Psych:  Denies anxiety and depression.  Physical Exam  General:  Well-developed,well-nourished, in no acute distress; alert, appropriate and cooperative throughout examination. Vitals reviewed. Lungs:  Normal respiratory  effort, chest expands symmetrically. Lungs are clear to auscultation, no crackles or wheezes. Heart:  Normal rate and regular rhythm. S1 and S2 normal without gallop, murmur, click, rub or other extra sounds. Abdomen:  Bowel sounds positive,abdomen soft and non-tender without masses, organomegaly or hernias noted. Pulses:  R and L dorsalis pedis and posterior tibial pulses are full and equal bilaterally. Extremities:  No edema. Neurologic:  Alert & oriented X3 and gait normal.   Psych:  Oriented X3, memory intact for recent and remote, normally interactive, good eye contact, and not anxious appearing.      Impression & Recommendations:  Problem # 1:  HYPERTENSION, BENIGN ESSENTIAL (ICD-401.1) Assessment Unchanged  Her updated medication list for this problem includes:    Hydrochlorothiazide 25 Mg Tabs (Hydrochlorothiazide) .Marland Kitchen... 1 tablet by mouth daily    Enalapril Maleate 5 Mg Tabs (Enalapril maleate) .Marland Kitchen... 1 tablet by mouth daily  Orders: [facility]- Est  Level 4 (99214)  Problem # 2:  HYPERLIPIDEMIA, MIXED (ICD-272.2) Assessment: Unchanged  Her updated medication list for this problem includes:    Simvastatin 80 Mg Tabs (Simvastatin) .Marland Kitchen... 1 tablet by mouth daily  Orders: [facility]- Est  Level 4 (44010)  Problem # 3:  DEGENERATIVE JOINT DISEASE (ICD-715.90) Assessment: New Will Rx Mobic. Her updated medication list for this problem includes:    Bayer Low Strength 81 Mg Tbec (Aspirin) .Marland Kitchen... 1 tablet by mouth daily    Mobic 7.5 Mg Tabs (Meloxicam) ..... One by mouth daily with food  Orders: [facility]- Est  Level 4 (27253)  Problem # 4:  DISORDER, DEPRESSIVE NEC (ICD-311) Assessment: Unchanged  The following medications were removed from the medication list:    Ativan 0.5 Mg Tabs (Lorazepam) .Marland Kitchen... 1/2 to 1 tablet by mouth daily as needed for anxiety Her updated medication list for this problem includes:    Zoloft 50 Mg Tabs (Sertraline hcl) .Marland Kitchen... 1 tablet by mouth daily    Trazodone Hcl  100 Mg Tabs (Trazodone hcl) .Marland Kitchen... 1 tablet by mouth at bedtime as needed for sleep    Zoloft 100 Mg Tabs (Sertraline hcl) .Marland Kitchen... Take 1 tablet by mouth daily with 50 mg tablet= 150mg   Orders: [facility]- Est  Level 4 (66440)  Problem # 5:  GERD (ICD-530.81) Assessment: Unchanged  Her updated medication list for this problem includes:    Prilosec 20 Mg Cpdr (Omeprazole) .Marland Kitchen... 1 tablet by mouth two times a day as needed  Orders: [facility]- Est  Level 4 (99214)  Problem # 6:  INSOMNIA, CHRONIC (ICD-307.42) Assessment: Unchanged  Orders: [facility]- Est  Level 4 (34742)  Complete Medication List: 1)  Zoloft 50 Mg Tabs (Sertraline hcl) .Marland Kitchen.. 1 tablet by mouth daily 2)  Prilosec 20 Mg Cpdr (Omeprazole) .Marland Kitchen.. 1 tablet by mouth two times a day as needed 3)  Hydrochlorothiazide  25 Mg Tabs (Hydrochlorothiazide) .Marland Kitchen.. 1 tablet by mouth daily 4)  Enalapril Maleate 5 Mg Tabs (Enalapril maleate) .Marland Kitchen.. 1 tablet by mouth daily 5)  Bayer Low Strength 81 Mg Tbec (Aspirin) .Marland Kitchen.. 1 tablet by mouth daily 6)  Trazodone Hcl 100 Mg Tabs (Trazodone hcl) .Marland Kitchen.. 1 tablet by mouth at bedtime as needed for sleep 7)  Simvastatin 80 Mg Tabs (Simvastatin) .Marland Kitchen.. 1 tablet by mouth daily 8)  Zoloft 100 Mg Tabs (Sertraline hcl) .... Take 1 tablet by mouth daily with 50 mg tablet= 150mg  9)  Proair Hfa 108 (90 Base) Mcg/act Aers (Albuterol sulfate) .... Two puffs qid as needed for cough and wheeze 10)  Mobic 7.5 Mg Tabs (Meloxicam) .... One by mouth daily with food  Patient Instructions: 1)  It was nice to meet you today! 2)  I am refilling all of your medications and giving you a new prescription for your arthritis pain. Prescriptions: MOBIC 7.5 MG TABS (MELOXICAM) one by mouth daily with food  #90 x 0   Entered and Authorized by:   Helane Rima DO   Signed by:   Helane Rima DO on 01/30/2010   Method used:   Electronically to        [facility] [phone]* (retail)       [address]       [place], [place]  [address]       Ph:  [phone]       Fax: [phone]   RxID:   [id number] ZOLOFT 100 MG TABS (SERTRALINE HCL) take 1 tablet by mouth daily with 50 mg tablet= 150mg   #90 x 3   Entered and Authorized by:   Helane Rima DO   Signed by:   Helane Rima DO on 01/30/2010   Method used:   Electronically to        [facility] [phone]* (retail)       [address]       [place], [place]  [address]       Ph: [phone]       Fax: [phone]   RxID:   [id number] SIMVASTATIN 80 MG  TABS (SIMVASTATIN) 1 tablet by mouth daily  #90 Tablet x 3   Entered and Authorized by:   Helane Rima DO   Signed by:   Helane Rima DO on 01/30/2010   Method used:   Electronically to        [facility] [phone]* (retail)       [address]       [place], [place]  [address]       Ph: [phone]       Fax: [phone]   RxID:   [id number] TRAZODONE HCL 100 MG  TABS (TRAZODONE HCL) 1 tablet by mouth at bedtime as needed for sleep  #90 x 3   Entered and Authorized by:   Helane Rima DO   Signed by:   Helane Rima DO on 01/30/2010   Method used:   Electronically to        [facility] [phone]* (retail)       [address]       [place], [place]  [address]       Ph: [phone]       Fax: [phone]   RxID:   [id number] ENALAPRIL MALEATE 5 MG  TABS (ENALAPRIL MALEATE) 1 tablet by mouth daily  #90 x 3   Entered and Authorized by:   Helane Rima DO  Signed by:   Helane Rima DO on 01/30/2010   Method used:   Electronically to        [facility] [phone]* (retail)       [address]       [place], [place]  [address]       Ph: [phone]       Fax: [phone]   RxID:   [id number] HYDROCHLOROTHIAZIDE 25 MG  TABS (HYDROCHLOROTHIAZIDE) 1 tablet by mouth daily  #90 x 3   Entered and Authorized by:   Helane Rima DO   Signed by:   Helane Rima DO on 01/30/2010   Method used:   Electronically to        [facility] [phone]* (retail)       [address]        [place], [place]  [address]       Ph: [phone]       Fax: [phone]   RxID:   [id number] PRILOSEC 20 MG  CPDR (OMEPRAZOLE) 1 tablet by mouth two times a day as needed  #90 x 3   Entered and Authorized by:   Helane Rima DO   Signed by:   Helane Rima DO on 01/30/2010   Method used:   Electronically to        [facility] [phone]* (retail)       [address]       [place], [place]  [address]       Ph: [phone]       Fax: [phone]   RxID:   [id number] ZOLOFT 50 MG  TABS (SERTRALINE HCL) 1 tablet by mouth daily  #90 x 3   Entered and Authorized by:   Helane Rima DO   Signed by:   Helane Rima DO on 01/30/2010   Method used:   Electronically to        [facility] [phone]* (retail)       [address]       [place], [place]  [address]       Ph: [phone]       Fax: [phone]   RxID:   [id number] MOBIC 7.5 MG TABS (MELOXICAM) one by mouth daily with food  #90 x 0   Entered and Authorized by:   Helane Rima DO   Signed by:   Helane Rima DO on 01/30/2010   Method used:   Print then Give to Patient   RxID:   [id number] ZOLOFT 100 MG TABS (SERTRALINE HCL) take 1 tablet by mouth daily with 50 mg tablet= 150mg   #90 x 3   Entered and Authorized by:   Helane Rima DO   Signed by:   Helane Rima DO on 01/30/2010   Method used:   Print then Give to Patient   RxID:   [id number] SIMVASTATIN 80 MG  TABS (SIMVASTATIN) 1 tablet by mouth daily  #90 Tablet x 3   Entered and Authorized by:   Helane Rima DO   Signed by:   Helane Rima DO on 01/30/2010   Method used:   Print then Give to Patient   RxID:   [id number] TRAZODONE HCL 100 MG  TABS (TRAZODONE HCL) 1 tablet by mouth at bedtime as needed for sleep  #90 x 3   Entered and Authorized by:   Helane Rima DO   Signed by:   Helane Rima DO on 01/30/2010   Method used:   Print then Give to Patient  RxID:   [id number] ENALAPRIL MALEATE 5 MG  TABS (ENALAPRIL MALEATE) 1  tablet by mouth daily  #90 x 3   Entered and Authorized by:   Helane Rima DO   Signed by:   Helane Rima DO on 01/30/2010   Method used:   Print then Give to Patient   RxID:   [id number] HYDROCHLOROTHIAZIDE 25 MG  TABS (HYDROCHLOROTHIAZIDE) 1 tablet by mouth daily  #90 x 3   Entered and Authorized by:   Helane Rima DO   Signed by:   Helane Rima DO on 01/30/2010   Method used:   Print then Give to Patient   RxID:   [id number] PRILOSEC 20 MG  CPDR (OMEPRAZOLE) 1 tablet by mouth two times a day as needed  #90 x 3   Entered and Authorized by:   Helane Rima DO   Signed by:   Helane Rima DO on 01/30/2010   Method used:   Print then Give to Patient   RxID:   [id number] ZOLOFT 50 MG  TABS (SERTRALINE HCL) 1 tablet by mouth daily  #90 x 3   Entered and Authorized by:   Helane Rima DO   Signed by:   Helane Rima DO on 01/30/2010   Method used:   Print then Give to Patient   RxID:   [id number]   Prevention & Chronic Care Immunizations   Influenza vaccine: Fluvax 3+  (08/30/2007)   Influenza vaccine due: 08/29/2008    Tetanus booster: 10/01/2001: Done.   Tetanus booster due: 10/02/2011    Pneumococcal vaccine: Pneumovax (Medicare)  (12/08/2007)   Pneumococcal vaccine due: None    H. zoster vaccine: Not documented  Colorectal Screening   Hemoccult: negative x 3  (09/22/2007)   Hemoccult due: Not Indicated    Colonoscopy: Done.  (05/06/2005)   Colonoscopy due: 05/07/2015  Other Screening   Pap smear: Done.  (03/31/2005)   Pap smear due: 03/31/2006    Mammogram: No specific mammographic evidence of malignancy.  Assessment: BIRADS 1.   (01/01/2008)   Mammogram action/deferral: Screening mammogram in 1 year.     (01/01/2008)   Mammogram due: 12/31/2008    DXA bone density scan: Hip Total: T Score -2.5 to -1.0 Hip.    Location:  [facility].      (12/29/2007)   DXA scan due: 01/2010    Smoking status: quit > 6 months   (01/30/2010)  Lipids   Total Cholesterol: 280  (05/29/2008)   LDL: 176  (05/29/2008)   LDL Direct: 82  (03/23/2009)   HDL: 45  (05/29/2008)   Triglycerides: 295  (05/29/2008)    SGOT (AST): 20  (03/23/2009)   SGPT (ALT): 14  (03/23/2009)   Alkaline phosphatase: 76  (03/23/2009)   Total bilirubin: 0.2  (03/23/2009)    Lipid flowsheet reviewed?: Yes   Progress toward LDL goal: Unchanged  Hypertension   Last Blood Pressure: 129 / 77  (01/30/2010)   Serum creatinine: 0.56  (03/23/2009)   Serum potassium 4.3  (03/23/2009)    Hypertension flowsheet reviewed?: Yes   Progress toward BP goal: At goal  Self-Management Support :    Hypertension self-management support: Not documented    Lipid self-management support: Not documented

## 2011-01-02 NOTE — Progress Notes (Signed)
Summary: UPDATE PROBLEM LIST   

## 2011-03-18 ENCOUNTER — Ambulatory Visit (INDEPENDENT_AMBULATORY_CARE_PROVIDER_SITE_OTHER): Payer: Medicare Other | Admitting: Family Medicine

## 2011-03-18 ENCOUNTER — Encounter: Payer: Self-pay | Admitting: Family Medicine

## 2011-03-18 VITALS — BP 133/74 | HR 101 | Ht 61.0 in | Wt 148.7 lb

## 2011-03-18 DIAGNOSIS — E782 Mixed hyperlipidemia: Secondary | ICD-10-CM

## 2011-03-18 DIAGNOSIS — M1990 Unspecified osteoarthritis, unspecified site: Secondary | ICD-10-CM

## 2011-03-18 DIAGNOSIS — I10 Essential (primary) hypertension: Secondary | ICD-10-CM

## 2011-03-18 DIAGNOSIS — M7740 Metatarsalgia, unspecified foot: Secondary | ICD-10-CM | POA: Insufficient documentation

## 2011-03-18 DIAGNOSIS — M7750 Other enthesopathy of unspecified foot: Secondary | ICD-10-CM

## 2011-03-18 LAB — BASIC METABOLIC PANEL
BUN: 17 mg/dL (ref 6–23)
CO2: 26 mEq/L (ref 19–32)
Calcium: 9.8 mg/dL (ref 8.4–10.5)
Chloride: 105 mEq/L (ref 96–112)
Creat: 0.76 mg/dL (ref 0.40–1.20)
Glucose, Bld: 103 mg/dL — ABNORMAL HIGH (ref 70–99)
Potassium: 3.9 mEq/L (ref 3.5–5.3)
Sodium: 142 mEq/L (ref 135–145)

## 2011-03-18 MED ORDER — MELOXICAM 15 MG PO TABS: 15.0000 mg | ORAL_TABLET | Freq: Every day | ORAL | Status: DC

## 2011-03-18 NOTE — Progress Notes (Signed)
  Subjective:    Patient ID: Nicole Dean, female    DOB: 16-Dec-1940, 70 y.o.   MRN: [id number]  HPI  1. HTN: Rx HCTZ, Enalapril, ASA. Denies CP, SOB, N/V/D, HA, dizziness, vision changes, LE edema.  2. HLD: Rx Simvastatin (80 - but stable for years). LDL = 43 (07/25/10). No myalgias.  3. OA: c/o arthritis pain in multiple joints, worst in bilateral hands, does not keep her from doing daily activities. No weakness or increased pain.  4. Depression: Rx Zoloft. She has been on a stable dose for 11 years. No SI/HI.  5. Insomnia: Rx Trazodone.  6. Foot Pain: Worse over the last 3 weeks. No trauma or increase in activity. No change in shoes. No heels. Pain metatarsals. Never happened before.  Review of Systems SEE HPI.    Objective:   Physical Exam  Constitutional: She is oriented to person, place, and time. She appears well-developed and well-nourished. No distress.  HENT:  Right Ear: External ear normal.  Left Ear: External ear normal.  Nose: Nose normal.  Mouth/Throat: Oropharynx is clear and moist.  Eyes: Conjunctivae and EOM are normal. Pupils are equal, round, and reactive to light.  Neck: Neck supple. No JVD present. No thyromegaly present.  Cardiovascular: Normal rate, regular rhythm, normal heart sounds and intact distal pulses.   Pulmonary/Chest: Effort normal and breath sounds normal.  Abdominal: Bowel sounds are normal. She exhibits no distension and no mass. There is no tenderness.  Musculoskeletal:       Bilateral pes planus. TTP between 4th/5th digit bilaterally. No nodule palpated. Normal strength.  Neurological: She is alert and oriented to person, place, and time. She has normal reflexes. No cranial nerve deficit.  Skin: Skin is warm and dry.  Psychiatric: She has a normal mood and affect.      Assessment & Plan:

## 2011-03-18 NOTE — Patient Instructions (Signed)
It was nice to see you today!  Please make an appointment to be seen in the [facility] to see if orthotics would be helpful for you.

## 2011-03-19 ENCOUNTER — Encounter: Payer: Self-pay | Admitting: Family Medicine

## 2011-03-19 MED ORDER — HYDROCHLOROTHIAZIDE 25 MG PO TABS: 25.0000 mg | ORAL_TABLET | Freq: Every day | ORAL | Status: DC

## 2011-03-19 MED ORDER — SIMVASTATIN 80 MG PO TABS: 80.0000 mg | ORAL_TABLET | Freq: Every day | ORAL | Status: DC

## 2011-03-19 MED ORDER — ENALAPRIL MALEATE 5 MG PO TABS: 5.0000 mg | ORAL_TABLET | Freq: Every day | ORAL | Status: DC

## 2011-03-19 NOTE — Assessment & Plan Note (Signed)
Doubt stress fracture and neuroma. Will have patient make appointment for evaluation in the [facility] with possible orthotics at that time.

## 2011-03-20 ENCOUNTER — Other Ambulatory Visit: Payer: Self-pay | Admitting: Family Medicine

## 2011-03-20 DIAGNOSIS — F329 Major depressive disorder, single episode, unspecified: Secondary | ICD-10-CM

## 2011-03-20 MED ORDER — SERTRALINE HCL 100 MG PO TABS: 100.0000 mg | ORAL_TABLET | Freq: Every day | ORAL | Status: DC

## 2011-03-20 NOTE — Telephone Encounter (Signed)
Refill request

## 2011-03-20 NOTE — Telephone Encounter (Signed)
Message left on patient's voicemail that Rx has been sent.

## 2011-03-20 NOTE — Telephone Encounter (Signed)
Refill request for Dr. Philis Pique patient . Needs today.

## 2011-03-20 NOTE — Telephone Encounter (Signed)
Done

## 2011-03-20 NOTE — Telephone Encounter (Signed)
Daughter called and stated she was here on 4/17 and didn't get rx for her zolft - looks like pharmacy has sent in request - pt is needing this today Please advise

## 2011-03-25 ENCOUNTER — Ambulatory Visit (INDEPENDENT_AMBULATORY_CARE_PROVIDER_SITE_OTHER): Payer: Medicare Other | Admitting: Family Medicine

## 2011-03-25 ENCOUNTER — Encounter: Payer: Self-pay | Admitting: Family Medicine

## 2011-03-25 DIAGNOSIS — M7750 Other enthesopathy of unspecified foot: Secondary | ICD-10-CM

## 2011-03-25 DIAGNOSIS — M79609 Pain in unspecified limb: Secondary | ICD-10-CM

## 2011-03-25 DIAGNOSIS — M7740 Metatarsalgia, unspecified foot: Secondary | ICD-10-CM

## 2011-03-25 DIAGNOSIS — M79672 Pain in left foot: Secondary | ICD-10-CM

## 2011-03-26 DIAGNOSIS — M79672 Pain in left foot: Secondary | ICD-10-CM | POA: Insufficient documentation

## 2011-03-26 NOTE — Progress Notes (Signed)
70 yo pleasant F with 3-4 week hx B foot pain.  L foot, pain mostly dorsally with no h/o trauma. No edema, no bruising. No plantar pain. No ankle pain.  R foot, pain on plantar aspect, and mildly with forefoot squeeze.   Some pain with walking B. No h/o trauma. No prior operative interventions  The PMH, PSH, Social History, Family History, Medications, and allergies have been reviewed in [facility], and have been updated if relevant.  REVIEW OF SYSTEMS  GEN: No fevers, chills. Nontoxic. Primarily MSK c/o today. MSK: Detailed in the HPI GI: tolerating PO intake without difficulty Neuro: No numbness, parasthesias, or tingling associated. Otherwise the pertinent positives of the ROS are noted above.   GEN: Well-developed,well-nourished,in no acute distress; alert,appropriate and cooperative throughout examination HEENT: Normocephalic and atraumatic without obvious abnormalities. Ears, externally no deformities PULM: Breathing comfortably in no respiratory distress EXT: No clubbing, cyanosis, or edema PSYCH: Normally interactive. Cooperative during the interview. Pleasant. Friendly and conversant. Not anxious or depressed appearing. Normal, full affect.  FEET: B Echymosis: no Edema: no ROM: full LE B Gait: R turned on R MT pain: R plantar L 2ND AND 3RD MIDSHAFT PAIN WITH COMPRESSION ALONG SHAFT, NOT SOFT-TISSUE Callus pattern: none Lateral Mall: NT Medial Mall: NT Talus: NT Navicular: NT Cuboid: NT Calcaneous: NT Metatarsals: NT 5th MT: NT Phalanges: NT Achilles: NT Plantar Fascia: NT Fat Pad: NT Peroneals: NT Post Tib: NT Great Toe: Nml motion Ant Drawer: neg ATFL: NT CFL: NT Deltoid: NT Sensation: intact  A/P: 1. L stress reaction probable, L 2nd and 3rd MT shaft, place in post-op shoe, f/u 1 mo 2. Metatarsalgia, R with dropped MT heads, sports orthotics for cushioning, reassess at f/u

## 2011-04-18 NOTE — Op Note (Signed)
[facility]  Patient:    Nicole Dean, Nicole Dean                       MRN: [id number] Proc. Date: 06/01/00 Adm. Date:  [id number] Attending:  Nelda Marseille CC:         Petra Kuba, M.D.             Janeece Riggers. Dareen Piano, M.D. - [facility]             Cheree Ditto, M.D. - [facility]                           Operative Report  PROCEDURE:  Colonoscopy.  INDICATIONS:  Guaiac positivity due for colonic screening.  CONSENT:  Consent was signed after risks, benefits, methods and options were thoroughly discussed multiple times in the past.  MEDICINES USED:  Demerol 100, Versed 10.  PROCEDURE:  Rectal inspection is pertinent for external hemorrhoids.  Small digital exam was negative.  Video colonoscope was inserted and with some difficulty due to tortuous looping colon was able to advance to the cecum. This did requiring rolling her on her back and then on her right side with multiple abdominal pressures.  Cecum was identified by the appendiceal orifice in the ileocecal valve. The scope was then slowly withdrawn.  The prep on the right side was fair.  There was stool stuck to the wall of the colon which could not be washed off but on slow withdrawal through the remaining part of the colon the prep was adequate other than some left-sided diverticula no polypoid lesions, masses or other abnormalities were seen as we slowly withdrew back to the rectum.  Once back in the rectum the scope was retroflexed pertinent for some internal hemorrhoids.  The scope was then straightened, readvanced a short ways up the sigmoid.  Air was suctioned, scope removed.  The patient tolerated the procedure well.  There was no obvious immediate complication.  ENDOSCOPIC DIAGNOSES: 1. Internal and external hemorrhoids. 2. Left-sided diverticula. 3. Otherwise within normal limits to the cecum.  PLAN:  Continue workup with an EGD.  Yearly rectals and  guaiacs per either Dr. Dareen Piano or Dr. Marina Goodell and probably repeat colonic screening in 5 years. DD:  06/01/00 TD:  06/01/00 Job: [id number]

## 2011-04-18 NOTE — Procedures (Signed)
[facility]  Patient:    Nicole Dean, Nicole Dean                       MRN: [id number] Proc. Date: 06/25/01 Adm. Date:  [id number] Attending:  Nelda Marseille CC:         R. Valma Cava, M.D.   Procedure Report  PROCEDURE:  Esophagogastroduodenoscopy with biopsy.  INDICATION:  Patient with history of peptic ulcer disease, want to reevaluate since having some orthopedic complaint.  Want to see if it is safe for nonsteroidal use.  INFORMED CONSENT:  Consent was signed after risks, benefits, methods, and options were thoroughly discussed on multiple occasions.  MEDICINES USED:  Demerol 50, Versed 7.  DESCRIPTION OF PROCEDURE:  The video endoscope was inserted by direct vision. In the distal esophagus was a small hiatal hernia.  The scope passed into the stomach and advanced to the antrum pertinent for some moderate antritis.  The pylorus was deformed.  There was a linear ulcer along the pylorus.  The scope was inserted into a deformed duodenal bulb pertinent for a shallow bulb ulcer possibly inside a diverticula and was advanced around the C-loop to a normal second portion of the duodenum.  The scope was withdrawn back to the bulb, and a good look there confirmed the above findings.  The scope was withdrawn back to the stomach and retroflexed.  High in the cardia, the hiatal hernia was confirmed.  The fundus, angularis, lesser and greater curve were normal.  The scope was straightened, and straight visualization in the stomach was normal except for the moderate antritis.  Multiple biopsies of the antrum were obtained and two proximal biopsies of the stomach were also obtained to rule out Helicobacter.  Air was suctioned, scope slowly withdrawn.  Again, a good look at the esophagus was normal except for the hiatal hernia.  The scope was removed.  The patient tolerated the procedure well.  There was no obvious immediate complications.  ENDOSCOPIC  DIAGNOSES: 1. Small hiatal hernia. 2. Antritis status post biopsy. 3. Deformed pylorus, widely patent with a small, shallow ulcer. 4. Bulb scarring, possibly with a diverticula and small, shallow ulcer. 5. Otherwise within normal limits to the second portion of the duodenum.  PLAN:   Await pathology, continue pump inhibitors.  Will need Cytotec if a nonsteroidal is used, and we will try to move up her appointment with Dr. Thomasena Edis for the end of August. DD:  06/25/01 TD:  06/25/01 Job: [id number]

## 2011-04-18 NOTE — Procedures (Signed)
[place]. [facility]  Patient:    Nicole Dean, Nicole Dean                       MRN: [id number] Proc. Date: 06/01/00 Adm. Date:  [id number] Attending:  Nelda Marseille CC:         Janeece Riggers. Dareen Piano, M.D.                           Procedure Report  PROCEDURE PERFORMED:  Esophagogastroduodenoscopy with biopsy.  INDICATION:  Guaiac positivity, upper tract symptoms, nondiagnostic colonoscopy.  Consent was signed after risks, benefits, methods and options were thoroughly discussed multiple times in the past.  MEDICINES USED ADDITIONALLY FOR THIS PROCEDURE: Demerol 25, Versed 2.  DESCRIPTION OF PROCEDURE: Video endoscope was inserted by direct vision.  The esophagus was normal.  In the distal esophagus was a small hiatal hernia. Scope was inserted into the stomach and advanced through antrum, pertinent for some moderate antritis into the pyloric area where the peripyloric ulcer was seen in the past there. It had a nice white base.  The pylorus was deformed and widely patent.  The scope passed easily into the duodenum and around the C loop to a normal second portion of the duodenum.  The scope was withdrawn back to the stomach and retroflexed. The angularis, cardia and fundus were all normal.  The lesser and greater curve were evaluated on retroflexed and then straight visualization and then some mild gastritis. No additional findings were seen.  The scope was then advanced to the ulcer and a few biopsies were obtained of the edge and put in the first container.  We then took some biopsies of the moderate antritis which was was put in a second container. We also took a few proximal biopsies of the stomach to rule out Helicobacter. On slow withdraw through the esophagus confirmed a normal appearance.  The scope was removed.  The patient tolerated the procedure well. There was no evidence of immediate complication.  ENDOSCOPIC DIAGNOSES: 1. Small hiatal hernia. 2.  Significant antritis status post biopsy. 3. Peripyloric ulcer status post biopsy. 4. Otherwise normal esophagogastroduodenoscopy status post proximal stomach    biopsy as well to rule out Helicobacter.  PLAN:  Await pathology. Double Prilosec. Follow up p.r.n. or in two to three months.  Left to decide about repeat endoscopy to document Helicobacter. DD:  06/01/00 TD:  06/01/00 Job: [id number]

## 2011-05-07 ENCOUNTER — Other Ambulatory Visit: Payer: Self-pay | Admitting: Family Medicine

## 2011-06-24 ENCOUNTER — Other Ambulatory Visit: Payer: Self-pay | Admitting: Family Medicine

## 2011-06-24 MED ORDER — OMEPRAZOLE 20 MG PO CPDR: 20.0000 mg | DELAYED_RELEASE_CAPSULE | Freq: Two times a day (BID) | ORAL | Status: DC | PRN

## 2011-06-24 NOTE — Telephone Encounter (Signed)
Received fax request from [facility] at [facility] for Prilosec 20mg .  Sent in Electronically.

## 2011-07-31 ENCOUNTER — Encounter: Payer: Self-pay | Admitting: Family Medicine

## 2011-07-31 ENCOUNTER — Ambulatory Visit (INDEPENDENT_AMBULATORY_CARE_PROVIDER_SITE_OTHER): Payer: Medicare Other | Admitting: Family Medicine

## 2011-07-31 VITALS — BP 126/75 | HR 84 | Temp 98.3°F | Ht 61.0 in | Wt 149.7 lb

## 2011-07-31 DIAGNOSIS — J019 Acute sinusitis, unspecified: Secondary | ICD-10-CM

## 2011-07-31 MED ORDER — MOMETASONE FUROATE 50 MCG/ACT NA SUSP: 2.0000 | Freq: Every day | NASAL | Status: DC

## 2011-07-31 NOTE — Progress Notes (Signed)
Subjective: Pt presents complaining of sinus pressure and congestion of 5 days duration.  Has had some clear nasal drainage and dry cough along with pressure all over her sinuses..  No fevers/chills/visual changes.  No SOB/DOE/CP.  Has had problems with sinus infections before but none recently.  She has not had significant problems with allergies in the past.  Objective:  Filed Vitals:   07/31/11 1028  BP: 126/75  Pulse: 84  Temp: 98.3 F (36.8 C)   Gen: Pleasant, NAD HEENT:  Mild tenderness over both frontal and maxillary sinuses.  Clear nasal drainage with inflammed nasal turbinates..  No superficial erythema.  Throat is WNL.  PERRL, EOMI, TM WNL.\ CV: RRR Resp: CTABL Ext: 2+ pulses, <2 sec cap refill  Assessment/Plan: Likely acute viral sinusitis.  Will treat supportively and also give a trial of nasal steroid spray.  RTC if not better in 5-7 days.

## 2011-07-31 NOTE — Patient Instructions (Signed)
You have a viral infection that will resolve on its own over time.  Symptoms typically last 3-7 days but can stretch out to 2-3 weeks. You can use afrin for up to 5 days may help with congestion, mucinex or robitussin DM for cough.  I would also recommend using a nasal saline spray several times every day for the next week or so.  Unfortunately, antibiotics are not helpful for viral infections. Drink plenty of fluids and stay hydrated! Wash your hands frequently. I am also giving you a prescription for a nasal steroid spray.  Use it two times per day after using nasal saline.  Do this for the next week. Call if you are not improving by 7-10 days.

## 2011-10-01 ENCOUNTER — Other Ambulatory Visit: Payer: Self-pay | Admitting: Family Medicine

## 2011-10-01 MED ORDER — OMEPRAZOLE 20 MG PO CPDR: 20.0000 mg | DELAYED_RELEASE_CAPSULE | Freq: Every day | ORAL | Status: DC

## 2011-10-08 ENCOUNTER — Ambulatory Visit (INDEPENDENT_AMBULATORY_CARE_PROVIDER_SITE_OTHER): Payer: Medicare Other | Admitting: Family Medicine

## 2011-10-08 ENCOUNTER — Encounter: Payer: Self-pay | Admitting: Family Medicine

## 2011-10-08 VITALS — BP 158/74 | HR 94 | Temp 98.0°F | Ht 62.0 in | Wt 155.0 lb

## 2011-10-08 DIAGNOSIS — K219 Gastro-esophageal reflux disease without esophagitis: Secondary | ICD-10-CM

## 2011-10-08 DIAGNOSIS — J329 Chronic sinusitis, unspecified: Secondary | ICD-10-CM | POA: Insufficient documentation

## 2011-10-08 DIAGNOSIS — I10 Essential (primary) hypertension: Secondary | ICD-10-CM

## 2011-10-08 MED ORDER — AMOXICILLIN-POT CLAVULANATE 875-125 MG PO TABS: 1.0000 | ORAL_TABLET | Freq: Two times a day (BID) | ORAL | Status: AC

## 2011-10-08 MED ORDER — OMEPRAZOLE 20 MG PO CPDR: 20.0000 mg | DELAYED_RELEASE_CAPSULE | Freq: Every day | ORAL | Status: DC

## 2011-10-08 NOTE — Assessment & Plan Note (Signed)
Patient's clinical exam is consistent with a potential sinus infection. Patient has a history of sinus infections in the past as well. Patient does have a history of pneumonias in the past but lung exam is clear this time we will treat him with Augmentin for the next 10 days. Patient does have a penicillin allergy but states that it only made her feel nauseated so feel that this medication will be okay. Patient given handout as well as red flags and when to seek medical attention Patient will followup in the next 2-4 weeks with primary care provider for her other chronic comorbidities.

## 2011-10-08 NOTE — Patient Instructions (Signed)

## 2011-10-08 NOTE — Assessment & Plan Note (Signed)
Elevated today but not feeling good we'll have her followup with her primary care provider in 2-3 weeks

## 2011-10-08 NOTE — Progress Notes (Signed)
  Subjective:    Patient ID: Nicole Dean, female    DOB: 1941-05-01, 70 y.o.   MRN: [id number]  HPI 70 year old female coming in with a four-day history of cough and hoarseness. Patient states that maybe a week ago she started feeling a little more fatigued and having a little bit of a headache out of the ordinary. Patient states that the headaches seem to be frontal mostly and she has some sensitivity to light. Patient does have a history of having sinus infections in the past. Patient states that she did have a fever starting last night and 100.1 which she did take Tylenol and it responded to. Today she is 86F. Patient states that his cough is somewhat productive she's been coughing so much that now her abdominal and rib cage seems to be sore. Patient states that she has felt nauseated but has not vomited patient also denies any shortness of breath or any chest pain. Patient also denies any diarrhea constipation or any dysuria   Review of Systems As stated in the history of present illness    Objective:   Physical Exam General: No apparent distress patient does look tired as well as has some mild facial asymmetry secondary to maxillary swelling. HEENT: Pupils equal reactive to light and accommodation extraocular movements intact tympanic membranes nonbulging non-erythemic bilaterally but does have air fluid levels behind both years. Patient does have a moist mucous membranes but does have significant postnasal drip and posterior pharynx is erythemic patient does have positive anterior cervical lymphadenopathy patient also has significant tenderness to percussion over the maxillary and frontal sinuses Cardiovascular: Regular rate and rhythm no murmur Pulmonary: Clear to auscultation bilaterally     Assessment & Plan:

## 2011-10-14 ENCOUNTER — Encounter: Payer: Self-pay | Admitting: Family Medicine

## 2011-10-14 ENCOUNTER — Ambulatory Visit (INDEPENDENT_AMBULATORY_CARE_PROVIDER_SITE_OTHER): Payer: Medicare Other | Admitting: Family Medicine

## 2011-10-14 VITALS — BP 124/80 | HR 86 | Temp 97.8°F | Ht 61.0 in | Wt 152.0 lb

## 2011-10-14 DIAGNOSIS — J329 Chronic sinusitis, unspecified: Secondary | ICD-10-CM

## 2011-10-14 MED ORDER — CETIRIZINE HCL 10 MG PO TABS: 10.0000 mg | ORAL_TABLET | Freq: Every day | ORAL | Status: DC

## 2011-10-14 NOTE — Patient Instructions (Signed)

## 2011-10-17 DIAGNOSIS — J329 Chronic sinusitis, unspecified: Secondary | ICD-10-CM | POA: Insufficient documentation

## 2011-10-17 NOTE — Assessment & Plan Note (Signed)
Likely chronic sinusitis exacerbated by viral vs. Allergic source given no response to abx. Will start pt on flonase and zyrtec. Discussed supportive care. Next step in management would be addition of leukotriene modulator if sxs persist over the next 3-6 months.

## 2011-10-17 NOTE — Progress Notes (Signed)
  Subjective:    Patient ID: Nicole Dean, female    DOB: Jun 22, 1941, 70 y.o.   MRN: [id number]  Sinusitis This is a recurrent problem. The current episode started 1 to 4 weeks ago. The problem is unchanged. There has been no fever. The pain is mild. Associated symptoms include congestion and sinus pressure. (+ frontal headache pain ) Past treatments include oral decongestants. The treatment provided mild relief.  Pt was previously seen on 11/9 for this in clinic and was dxd with sinus infection and placed on augmentin.  Pt states that sxs have been unchanged despite abx.   Pt states that this has been a recurrent issue in the past. Sxs usually exacerbated by season and with allergies. Pt has been intermittently on zyrtec. Pt was previously prescribed flonase. Pt has not been taking this.     Review of Systems  HENT: Positive for congestion and sinus pressure.        Objective:   Physical Exam  HENT:  Right Ear: Tympanic membrane normal.  Left Ear: Tympanic membrane normal.  Nose: Mucosal edema, rhinorrhea and sinus tenderness present. Right sinus exhibits maxillary sinus tenderness. Left sinus exhibits maxillary sinus tenderness.  Mouth/Throat: Posterior oropharyngeal erythema present.          Assessment & Plan:

## 2011-11-19 ENCOUNTER — Other Ambulatory Visit: Payer: Self-pay | Admitting: Family Medicine

## 2011-11-19 NOTE — Telephone Encounter (Signed)
Refill request

## 2011-12-24 ENCOUNTER — Ambulatory Visit (INDEPENDENT_AMBULATORY_CARE_PROVIDER_SITE_OTHER): Payer: Medicare Other | Admitting: Family Medicine

## 2011-12-24 ENCOUNTER — Encounter: Payer: Self-pay | Admitting: Family Medicine

## 2011-12-24 DIAGNOSIS — F329 Major depressive disorder, single episode, unspecified: Secondary | ICD-10-CM

## 2011-12-24 DIAGNOSIS — E782 Mixed hyperlipidemia: Secondary | ICD-10-CM

## 2011-12-24 DIAGNOSIS — I10 Essential (primary) hypertension: Secondary | ICD-10-CM

## 2011-12-24 DIAGNOSIS — K219 Gastro-esophageal reflux disease without esophagitis: Secondary | ICD-10-CM

## 2011-12-24 MED ORDER — SIMVASTATIN 80 MG PO TABS: 80.0000 mg | ORAL_TABLET | Freq: Every day | ORAL | Status: DC

## 2011-12-24 MED ORDER — TRAZODONE HCL 100 MG PO TABS: 100.0000 mg | ORAL_TABLET | Freq: Every day | ORAL | Status: DC

## 2011-12-24 MED ORDER — SERTRALINE HCL 100 MG PO TABS: 100.0000 mg | ORAL_TABLET | Freq: Every day | ORAL | Status: DC

## 2011-12-24 MED ORDER — OMEPRAZOLE 20 MG PO CPDR: 20.0000 mg | DELAYED_RELEASE_CAPSULE | Freq: Every day | ORAL | Status: DC

## 2011-12-24 MED ORDER — SERTRALINE HCL 50 MG PO TABS: 50.0000 mg | ORAL_TABLET | Freq: Every day | ORAL | Status: DC

## 2011-12-24 MED ORDER — ENALAPRIL MALEATE 5 MG PO TABS: 5.0000 mg | ORAL_TABLET | Freq: Every day | ORAL | Status: DC

## 2011-12-24 MED ORDER — HYDROCHLOROTHIAZIDE 25 MG PO TABS: 25.0000 mg | ORAL_TABLET | Freq: Every day | ORAL | Status: DC

## 2011-12-24 NOTE — Patient Instructions (Addendum)
It was very nice to meet you today. Your current medication regimen that you are on seems to be working quite well I would like to see you back in 4-6 months to be sure you are still doing well and to check your labwork I will send your prescriptions into your pharmacy this afternoon Please call with any questions, have a great day.

## 2012-01-05 NOTE — Assessment & Plan Note (Signed)
Symptoms well controlled, no SI or HI.  Sertraline refilled.

## 2012-01-05 NOTE — Assessment & Plan Note (Signed)
BP well controlled, no change in curent meds.  Medications refilled.  F/u 4-6 months.

## 2012-01-05 NOTE — Progress Notes (Signed)
  Subjective:    Patient ID: Nicole Dean, female    DOB: June 13, 1941, 71 y.o.   MRN: [id number]  HPI  Here for f/u on chronic medical conditions and refills on medicatons  1.  HTN:  Compliant with medications.  Does not check BP at home.  She denies side effects from medications, chest pain, nausea, shortness of breath, headaches.  2. Depression:  Current medication controls symptoms quite well.  Deniies change in appetites,s sleep pattern, SI, or HI.   Review of Systems     Objective:   Physical Exam  Constitutional: She appears well-developed and well-nourished. No distress.  Cardiovascular: Normal rate and regular rhythm.   Pulmonary/Chest: Effort normal.  Musculoskeletal: She exhibits no edema.  Neurological: She is alert.  Psychiatric: She has a normal mood and affect. Her behavior is normal.          Assessment & Plan:

## 2012-07-23 ENCOUNTER — Other Ambulatory Visit: Payer: Self-pay | Admitting: *Deleted

## 2012-07-23 DIAGNOSIS — F329 Major depressive disorder, single episode, unspecified: Secondary | ICD-10-CM

## 2012-07-26 MED ORDER — TRAZODONE HCL 100 MG PO TABS: 100.0000 mg | ORAL_TABLET | Freq: Every day | ORAL | Status: DC

## 2012-07-26 MED ORDER — SERTRALINE HCL 100 MG PO TABS: 100.0000 mg | ORAL_TABLET | Freq: Every day | ORAL | Status: DC

## 2012-07-26 MED ORDER — SERTRALINE HCL 50 MG PO TABS: 50.0000 mg | ORAL_TABLET | Freq: Every day | ORAL | Status: DC

## 2012-08-09 ENCOUNTER — Encounter: Payer: Self-pay | Admitting: Family Medicine

## 2012-08-09 ENCOUNTER — Ambulatory Visit (INDEPENDENT_AMBULATORY_CARE_PROVIDER_SITE_OTHER): Payer: Medicare Other | Admitting: Family Medicine

## 2012-08-09 VITALS — BP 146/74 | HR 83 | Temp 97.4°F | Ht 61.0 in | Wt 148.0 lb

## 2012-08-09 DIAGNOSIS — F329 Major depressive disorder, single episode, unspecified: Secondary | ICD-10-CM

## 2012-08-09 DIAGNOSIS — F341 Dysthymic disorder: Secondary | ICD-10-CM

## 2012-08-09 DIAGNOSIS — F418 Other specified anxiety disorders: Secondary | ICD-10-CM | POA: Insufficient documentation

## 2012-08-09 MED ORDER — BUSPIRONE HCL 7.5 MG PO TABS: 7.5000 mg | ORAL_TABLET | Freq: Two times a day (BID) | ORAL | Status: DC

## 2012-08-09 NOTE — Assessment & Plan Note (Signed)
Acute reaction one week after her daughter's relapse. Difficulty concentrating, sleeping. Discussed with patient continuation of her behavioral coping strategies-working, exercise, talking with a family member/friend/pastor. She agrees to continue this. Discussed avoiding BZDs and side effects, will give trial of low dose buspar for next few weeks. Advised to DC for any side effects, continue her trazadone and SSRI. Advised to f/u with PCP for any titration, suspect this will be a short term treatment.

## 2012-08-09 NOTE — Patient Instructions (Addendum)
Nice to meet you. I am sorry to hear about your stress. You should keep doing things that make you happy. You may start buspar twice daily for anxiety. If you have side effects or too sleepy, then stop this medication. Please make an appointment with Dr. Ashley Royalty in the next 2 weeks for follow up. Would be very helpful to do regular exercise, or speak with a therapist or close friend also.  Anxiety and Panic Attacks Anxiety is your body's way of reacting to real danger or something you think is a danger. It may be fear or worry over a situation like losing your job. Sometimes the cause is not known. A panic attack is made up of physical signs like sweating, shaking, or chest pain. Anxiety and panic attacks may start suddenly. They may be strong. They may come at any time of day, even while sleeping. They may come at any time of life. Panic attacks are scary, but they do not harm you physically.  HOME CARE  Avoid any known causes of your anxiety.   Try to relax. Yoga may help. Tell yourself everything will be okay.   Exercise often.   Get expert advice and help (therapy) to stop anxiety or attacks from happening.   Avoid caffeine, alcohol, and drugs.   Only take medicine as told by your doctor.  GET HELP RIGHT AWAY IF:  Your attacks seem different than normal attacks.   Your problems are getting worse or concern you.  MAKE SURE YOU:  Understand these instructions.   Will watch your condition.   Will get help right away if you are not doing well or get worse.  Document Released: 12/20/2010 Document Revised: 11/06/2011 Document Reviewed: 12/20/2010 [facility] Patient Information 2012 [place], [place].

## 2012-08-09 NOTE — Progress Notes (Signed)
  Subjective:    Patient ID: Nicole Dean, female    DOB: 13-Jul-1941, 71 y.o.   MRN: [id number]  HPI  1. Anxiety/stress. Patient states she had been doing fine until last week. She found out her daughter had relapsed with drug abuse. Is very distraught that her trust is broken, her 70 yo granddaughter is now living with her ex-son in law. 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 Compliant with depression meds zoloft and trazadone. Desires something additional to calm her nerves during the daytime and perhaps help sleep, getting 3-4 hours nightly for past week. She denies any panic attacks, thoughts of self harm, syncope, falls.  Review of Systems See HPI otherwise negative.  reports that she quit smoking about 16 years ago. She has never used smokeless tobacco.     Objective:   Physical Exam  Vitals reviewed. Constitutional: She is oriented to person, place, and time. She appears well-developed and well-nourished.       Tearful when discussing situation. Is consolable.  HENT:  Head: Normocephalic and atraumatic.  Eyes: EOM are normal.  Neck: Neck supple.  Cardiovascular: Normal rate, regular rhythm and normal heart sounds.   No murmur heard. Pulmonary/Chest: No respiratory distress.  Musculoskeletal: She exhibits no edema and no tenderness.       Normal ambulation.  Neurological: She is alert and oriented to person, place, and time. Coordination normal.  Psychiatric:       Normal dress and speech.       Assessment & Plan:

## 2012-09-01 ENCOUNTER — Encounter: Payer: Self-pay | Admitting: Family Medicine

## 2012-09-01 ENCOUNTER — Ambulatory Visit (INDEPENDENT_AMBULATORY_CARE_PROVIDER_SITE_OTHER): Payer: Medicare Other | Admitting: Family Medicine

## 2012-09-01 VITALS — BP 130/74 | HR 84 | Ht 62.5 in | Wt 147.0 lb

## 2012-09-01 DIAGNOSIS — F329 Major depressive disorder, single episode, unspecified: Secondary | ICD-10-CM

## 2012-09-01 DIAGNOSIS — E782 Mixed hyperlipidemia: Secondary | ICD-10-CM

## 2012-09-01 DIAGNOSIS — Z0000 Encounter for general adult medical examination without abnormal findings: Secondary | ICD-10-CM

## 2012-09-01 DIAGNOSIS — I10 Essential (primary) hypertension: Secondary | ICD-10-CM

## 2012-09-01 DIAGNOSIS — F341 Dysthymic disorder: Secondary | ICD-10-CM

## 2012-09-01 LAB — CBC WITH DIFFERENTIAL/PLATELET
Basophils Absolute: 0 10*3/uL (ref 0.0–0.1)
Hemoglobin: 13.3 g/dL (ref 12.0–15.0)
Lymphocytes Relative: 28 % (ref 12–46)
Lymphs Abs: 1.8 10*3/uL (ref 0.7–4.0)
MCH: 31.4 pg (ref 26.0–34.0)
Monocytes Absolute: 0.3 10*3/uL (ref 0.1–1.0)
Monocytes Relative: 5 % (ref 3–12)
Neutro Abs: 4.3 10*3/uL (ref 1.7–7.7)
Neutrophils Relative %: 64 % (ref 43–77)
Platelets: 282 10*3/uL (ref 150–400)
RDW: 13.7 % (ref 11.5–15.5)
WBC: 6.6 10*3/uL (ref 4.0–10.5)

## 2012-09-01 LAB — LIPID PANEL
Cholesterol: 199 mg/dL (ref 0–200)
VLDL: 60 mg/dL — ABNORMAL HIGH (ref 0–40)

## 2012-09-01 LAB — COMPREHENSIVE METABOLIC PANEL
Alkaline Phosphatase: 66 U/L (ref 39–117)
Creat: 0.67 mg/dL (ref 0.50–1.10)
Potassium: 3.2 mEq/L — ABNORMAL LOW (ref 3.5–5.3)
Sodium: 138 mEq/L (ref 135–145)
Total Protein: 7 g/dL (ref 6.0–8.3)

## 2012-09-01 MED ORDER — ESOMEPRAZOLE MAGNESIUM 40 MG PO CPDR: 40.0000 mg | DELAYED_RELEASE_CAPSULE | Freq: Every day | ORAL | Status: DC

## 2012-09-01 MED ORDER — BUSPIRONE HCL 7.5 MG PO TABS: 7.5000 mg | ORAL_TABLET | Freq: Two times a day (BID) | ORAL | Status: DC

## 2012-09-01 NOTE — Progress Notes (Signed)
Patient ID: Nicole Dean, female   DOB: Dec 19, 1940, 71 y.o.   MRN: [id number] SUBJECTIVE:  71 y.o. female for annual routine checkup. Current Outpatient Prescriptions  Medication Sig Dispense Refill  . aspirin 81 MG EC tablet Take 81 mg by mouth daily.        . busPIRone (BUSPAR) 7.5 MG tablet Take 1 tablet (7.5 mg total) by mouth 2 (two) times daily.  60 tablet  0  . cetirizine (ZYRTEC ALLERGY) 10 MG tablet Take 1 tablet (10 mg total) by mouth daily.  30 tablet  11  . enalapril (VASOTEC) 5 MG tablet Take 1 tablet (5 mg total) by mouth daily.  90 tablet  3  . hydrochlorothiazide (HYDRODIURIL) 25 MG tablet Take 1 tablet (25 mg total) by mouth daily.  90 tablet  3  . mometasone (NASONEX) 50 MCG/ACT nasal spray Place 2 sprays into the nose daily.  17 g  2  . omeprazole (PRILOSEC) 20 MG capsule Take 1 capsule (20 mg total) by mouth daily.  90 capsule  3  . sertraline (ZOLOFT) 100 MG tablet Take 1 tablet (100 mg total) by mouth daily. Take with 50mg  tablet  30 tablet  6  . sertraline (ZOLOFT) 50 MG tablet Take 1 tablet (50 mg total) by mouth daily.  30 tablet  6  . simvastatin (ZOCOR) 80 MG tablet Take 1 tablet (80 mg total) by mouth daily.  90 tablet  3  . traZODone (DESYREL) 100 MG tablet Take 1 tablet (100 mg total) by mouth at bedtime. If needed for sleep.  30 tablet  6   Allergies: Penicillins  No LMP recorded. Patient is postmenopausal.  ROS:  Feeling well. No dyspnea or chest pain on exertion.  No abdominal pain, change in bowel habits, black or bloody stools.  No urinary tract symptoms. GYN ROS: no breast pain or new or enlarging lumps on self exam, no vaginal bleeding. No neurological complaints.  OBJECTIVE:  The patient appears well, alert, oriented x 3, in no distress. BP 130/74  Pulse 84  Ht 5' 2.5" (1.588 m)  Wt 147 lb (66.679 kg)  BMI 26.46 kg/m2 ENT normal.  Neck supple. No adenopathy or thyromegaly. PERLA.  Lungs are clear, good air entry, no wheezes, rhonchi or rales.  CV:  S1 and S2 normal, no murmurs, regular rate and rhythm.  Abdomen soft without tenderness, guarding, mass or organomegaly Extremities show no edema, normal peripheral pulses. Neurological is normal, no focal findings.    ASSESSMENT:  well woman  PLAN:  counseled on adequate intake of calcium and vitamin D Refilled buspar, working well for her anxiety additional lab tests per orders return annually or prn

## 2012-09-01 NOTE — Patient Instructions (Addendum)
Thank you for coming in today, it was good to see you Continue your current medications I will let you know about your labs when i get the results I will see you back in 6 months or sooner as needed

## 2012-12-28 ENCOUNTER — Other Ambulatory Visit: Payer: Self-pay | Admitting: *Deleted

## 2012-12-28 ENCOUNTER — Telehealth: Payer: Self-pay | Admitting: Family Medicine

## 2012-12-28 MED ORDER — ESOMEPRAZOLE MAGNESIUM 40 MG PO CPDR: 40.0000 mg | DELAYED_RELEASE_CAPSULE | Freq: Every day | ORAL | Status: DC

## 2012-12-28 NOTE — Telephone Encounter (Signed)
Patient has scheduled an appt for 2/3 but she is out of her Omeprazole and is hoping for enough to last until her appt.  She uses [facility] [address].  She said that she contacted them first, but I do not see anything about a refill request since August 23.

## 2012-12-28 NOTE — Telephone Encounter (Signed)
Sent in 1 month supply for nexium, told patient to keep her appointment with Dr Ashley Royalty for further refills.Busick, Rodena Medin

## 2013-01-03 ENCOUNTER — Encounter: Payer: Self-pay | Admitting: Family Medicine

## 2013-01-03 ENCOUNTER — Ambulatory Visit (INDEPENDENT_AMBULATORY_CARE_PROVIDER_SITE_OTHER): Payer: Medicare Other | Admitting: Family Medicine

## 2013-01-03 VITALS — BP 122/74 | HR 97 | Ht 62.5 in | Wt 148.0 lb

## 2013-01-03 DIAGNOSIS — E782 Mixed hyperlipidemia: Secondary | ICD-10-CM

## 2013-01-03 DIAGNOSIS — I10 Essential (primary) hypertension: Secondary | ICD-10-CM

## 2013-01-03 DIAGNOSIS — F329 Major depressive disorder, single episode, unspecified: Secondary | ICD-10-CM

## 2013-01-03 DIAGNOSIS — F341 Dysthymic disorder: Secondary | ICD-10-CM

## 2013-01-03 MED ORDER — OMEPRAZOLE 20 MG PO CPDR: 20.0000 mg | DELAYED_RELEASE_CAPSULE | Freq: Every day | ORAL | Status: DC

## 2013-01-03 MED ORDER — SERTRALINE HCL 100 MG PO TABS: 100.0000 mg | ORAL_TABLET | Freq: Every day | ORAL | Status: DC

## 2013-01-03 MED ORDER — ENALAPRIL MALEATE 5 MG PO TABS: 5.0000 mg | ORAL_TABLET | Freq: Every day | ORAL | Status: DC

## 2013-01-03 MED ORDER — BUSPIRONE HCL 15 MG PO TABS: 15.0000 mg | ORAL_TABLET | Freq: Two times a day (BID) | ORAL | Status: DC

## 2013-01-03 MED ORDER — SERTRALINE HCL 50 MG PO TABS: 50.0000 mg | ORAL_TABLET | Freq: Every day | ORAL | Status: DC

## 2013-01-03 MED ORDER — HYDROCHLOROTHIAZIDE 25 MG PO TABS: 25.0000 mg | ORAL_TABLET | Freq: Every day | ORAL | Status: DC

## 2013-01-03 MED ORDER — SIMVASTATIN 80 MG PO TABS: 80.0000 mg | ORAL_TABLET | Freq: Every day | ORAL | Status: DC

## 2013-01-03 MED ORDER — TRAZODONE HCL 100 MG PO TABS: 100.0000 mg | ORAL_TABLET | Freq: Every day | ORAL | Status: DC

## 2013-01-03 NOTE — Patient Instructions (Signed)
Thank you for coming in today, it was good to see you I am increasing your buspar to 15mg  two times per day.  Take this everyday If you feel like this is not helpful come back and see me.  Follow up with me again in 6 months or sooner as needed.

## 2013-01-09 NOTE — Assessment & Plan Note (Signed)
Still with significant symptoms, increase buspar to 15mg  bid.  She will follow up if still having difficulty.

## 2013-01-09 NOTE — Assessment & Plan Note (Signed)
Refilled mediations.  BP well controlled.  F/u in 6 months.

## 2013-01-09 NOTE — Progress Notes (Signed)
  Subjective:    Patient ID: Nicole Dean, female    DOB: 16-Jul-1941, 72 y.o.   MRN: [id number]  HPI  1. Anxiety/Depression:  F/u on anxiety and depression.  Currently using sertraline 150mg  which is benefecial she feels like.  However she still has moments where she gets very anxious.  She is also using buspar but feels that this needs to be increased.  She is currently living with her ex-husband which she finds quite stressful, however she does not feel like she can move out at this time.  She does feel safe at home.    2. HTN: CHRONIC HYPERTENSION  Disease Monitoring  Blood pressure range: Does not self monitor at home.   Chest pain: no   Dyspnea: no   Claudication: no   Medication compliance: yes  Medication Side Effects  Lightheadedness: no   Urinary frequency: no   Edema: no  Preventitive Healthcare:  Exercise: yes, walks a few days per week   Diet Pattern: Generally healthy  Salt Restriction: no.  NO added salt.    Review of Systems Per HPI    Objective:   Physical Exam  Constitutional: She appears well-nourished. No distress.  HENT:  Head: Normocephalic and atraumatic.  Neck: Neck supple.  Cardiovascular: Normal rate and regular rhythm.   Pulmonary/Chest: Effort normal and breath sounds normal.  Musculoskeletal: She exhibits no edema.  Neurological: She is alert.  Psychiatric:  Appears somewhat anxious.          Assessment & Plan:

## 2013-09-19 ENCOUNTER — Ambulatory Visit (INDEPENDENT_AMBULATORY_CARE_PROVIDER_SITE_OTHER): Payer: Medicare Other | Admitting: Family Medicine

## 2013-09-19 ENCOUNTER — Encounter: Payer: Self-pay | Admitting: Family Medicine

## 2013-09-19 VITALS — BP 118/64 | HR 76 | Temp 98.2°F | Wt 142.0 lb

## 2013-09-19 DIAGNOSIS — F329 Major depressive disorder, single episode, unspecified: Secondary | ICD-10-CM

## 2013-09-19 DIAGNOSIS — F341 Dysthymic disorder: Secondary | ICD-10-CM

## 2013-09-19 DIAGNOSIS — F418 Other specified anxiety disorders: Secondary | ICD-10-CM

## 2013-09-19 DIAGNOSIS — I10 Essential (primary) hypertension: Secondary | ICD-10-CM

## 2013-09-19 DIAGNOSIS — K219 Gastro-esophageal reflux disease without esophagitis: Secondary | ICD-10-CM

## 2013-09-19 DIAGNOSIS — F4321 Adjustment disorder with depressed mood: Secondary | ICD-10-CM

## 2013-09-19 LAB — COMPREHENSIVE METABOLIC PANEL
AST: 17 U/L (ref 0–37)
BUN: 13 mg/dL (ref 6–23)
Calcium: 9.8 mg/dL (ref 8.4–10.5)
Chloride: 102 mEq/L (ref 96–112)
Creat: 0.6 mg/dL (ref 0.50–1.10)
Total Bilirubin: 0.3 mg/dL (ref 0.3–1.2)

## 2013-09-19 MED ORDER — BUSPIRONE HCL 15 MG PO TABS: 15.0000 mg | ORAL_TABLET | Freq: Two times a day (BID) | ORAL | Status: AC

## 2013-09-19 MED ORDER — OMEPRAZOLE 20 MG PO CPDR: 20.0000 mg | DELAYED_RELEASE_CAPSULE | Freq: Two times a day (BID) | ORAL | Status: DC

## 2013-09-19 NOTE — Assessment & Plan Note (Signed)
Depression currently well-controlled. Stable anxiety with some mild improvement, however does not seem typical for GAD (denies any worrying), mainly hyperactive complaints of unable to rest. Question if possible hypomania could explain pt's symptoms. No noted hx of manic episode. Symptoms less consistent with hypomania due to (no change in thoughts, goal-directed behavior, has difficulty getting all work done vs excessive working). 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 Increase Buspar to 15mg  BID (30mg  total daily) 2. Continue Sertraline, Trazodone 3. Check TSH, Free T4 - consider possible hyperthyroid to explain hyperactive sx 4. Encouraged pt to schedule time just for herself to help her relax and de-stress  Future Consideration: 1. If no improvement in anxiety, can inc Buspar once more to 30mg  BID, otherwise consider addition of mood stabilization medication (if think hypomania) vs may consider alternative SSRI for better anxiety coverage

## 2013-09-19 NOTE — Progress Notes (Signed)
Subjective:     Patient ID: Nicole Dean, female   DOB: Feb 11, 1941, 72 y.o.   MRN: [id number]  HPI  ANXIETY: Reports that she has been experiencing worse anxiety over the past year. 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 She describes a lot of responsibilities that she tries to take on and keep organized. Difficulty relaxing during her down time, but denies constant worrying about things. Admits to difficulty sleeping with 1-3x awakening at night. Current Meds - Sertraline 150mg  daily (100mg  + 50mg  tab), Trazodone 100mg  at bedtime, Buspar 7.5mg  BID (15mg  total daily) Denies any problems with mood (notes much improved on Sertraline), no unusual behaviors, thoughts, no additional sensory perceptions. Denies any suicidal or homicidal ideation.   CHRONIC HTN: Normotensive BP today, 118/64. Reports no complaints Current Meds - HCTZ 25mg  daily, Enalapril 5mg  daily   Reports good compliance, took meds today. Tolerating well, w/o complaints. Lifestyle - No formal exercise, but stays very active through day. Eats balanced diet Denies CP, dyspnea, claudication, HA, edema, dizziness / lightheadedness  GERD: Reports that her reflux is well controlled on Omeprazole, but has required 20mg  twice daily. Requested updated Rx for twice daily dosing.  HM: Colonoscopy - Reported last about 8 yrs ago, 2006. No concerns or recommended follow-up Mammogram - Last 2009. No desire to continue mammograms. Does self breast exam. No concerns  Social Hx: Former smoker (quit 1997). 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 Interested in Zostavax, will call insurance for coverage.  Review of Systems  See above HPI.     Objective:   Physical Exam  BP 118/64  Pulse 76  Temp(Src) 98.2 F (36.8 C) (Oral)  Wt 142 lb  (64.411 kg)  BMI 25.54 kg/m2  General - well-appearing 72 yr F, very pleasant, conversational, NAD HEENT - PERRLA, EOMI, pharynx clear, MMM Neck - supple, non-tender, no masses or thyromegaly appreciated, no carotid bruits heard Heart - RRR, S1 / S2 heard, no murmurs Lungs - CTAB, no wheezing, crackles, or rhonchi. Normal work of breathing Abd - soft, non-tender, non-distended, +BS Ext - no edema, non-tender, +2 peripheral pulses Neuro - awake, alert, oriented, grossly non-focal, intact muscle str 5/5 b/l, intact distal sensation Psych - appropriately congruent mood and affect, normal speech and thought content, good insight into condition     Assessment:     See specific A&P problem list for details.      Plan:     See specific A&P problem list for details.

## 2013-09-19 NOTE — Patient Instructions (Addendum)
Dear Nicole Dean, Thank you for coming in to clinic today. It was good to meet you!  Today we discussed your General Health and your Anxiety. 1. Overall, I think that you are doing fantastic, and I think you are doing a lot of great things. 2. For your anxiety, we can definitely increase your Buspar from 15mg  daily to 30mg  daily. As, 15mg  is a relatively low dose, and this medication is very safe without many side effects at higher doses. However, I do think that a big part of your relaxation problem is related to your busy schedule as we discussed. I think that you would benefit from really trying hard to carve out some time in your schedule just for something to get your mind off of your busy schedule. Also, some exercise such as walking 10-42min every other day or a few times a week can help de-stress and allow your mind to relax as well. 3. I've ordered some basic labs, including thyroid studies (to make sure that this isn't a problem adding to your anxiety). 4. I will mail you a letter with your lab results, and provide any other instructions. If there is a concern, you may need to return to the clinic. 5. Medication changes:   - Inc Buspar to 30mg  daily (take one 15mg  pill in the morning and one 15mg  pill at night)   - Omeprazole 20mg  twice daily 6. Recommend calling [facility] and ask for cost of Zostavax (Shingles vaccine), if it is covered then you can get it at any drug store.  Some important numbers from today's visit: BP - 118/64  Please schedule a follow-up appointment with me in 4-6 months to discuss your anxiety.  If you have any other questions or concerns, please feel free to call the clinic to contact me. You may also schedule an earlier appointment if necessary.  However, if your symptoms get significantly worse, please go to the Emergency Department to seek immediate medical attention.  Saralyn Pilar, DO [facility]

## 2013-09-19 NOTE — Assessment & Plan Note (Signed)
Well-controlled HTN, BP today 118/64  Meds - HCTZ 25mg  daily, Enalapril 5mg  daily No complications   Plan:  1. Continue current BP meds.  2. Check CMET, follow serum Cr (last 0.67, 08/2012) 3. Lifestyle Mods - Continue to encourage some scheduled exercise, Dec salt intake, inc K+ rich vegs

## 2013-09-19 NOTE — Assessment & Plan Note (Signed)
Improved control on Omeprazole 20mg  BID vs 20mg  daily  Plan: 1. Sent new Rx Omeprazole 20mg  BID

## 2013-09-20 ENCOUNTER — Encounter: Payer: Self-pay | Admitting: Family Medicine

## 2013-10-24 ENCOUNTER — Other Ambulatory Visit: Payer: Self-pay | Admitting: Family Medicine

## 2013-10-24 DIAGNOSIS — F329 Major depressive disorder, single episode, unspecified: Secondary | ICD-10-CM

## 2013-10-24 MED ORDER — TRAZODONE HCL 100 MG PO TABS: 100.0000 mg | ORAL_TABLET | Freq: Every day | ORAL | Status: DC

## 2013-10-24 MED ORDER — SERTRALINE HCL 50 MG PO TABS: 50.0000 mg | ORAL_TABLET | Freq: Every day | ORAL | Status: DC

## 2013-10-24 MED ORDER — SERTRALINE HCL 100 MG PO TABS: 100.0000 mg | ORAL_TABLET | Freq: Every day | ORAL | Status: DC

## 2013-10-24 NOTE — Telephone Encounter (Signed)
Sent refills for Sertraline 100mg  and 50mg  tablets (total daily dose 150mg ) (#90, 2 refills each). Also sent refill for Trazodone 100mg  once nightly at bedtime (#90, 2 refills)

## 2013-11-08 ENCOUNTER — Other Ambulatory Visit: Payer: Self-pay | Admitting: Family Medicine

## 2013-11-08 DIAGNOSIS — E782 Mixed hyperlipidemia: Secondary | ICD-10-CM

## 2013-11-08 MED ORDER — SIMVASTATIN 80 MG PO TABS: 80.0000 mg | ORAL_TABLET | Freq: Every day | ORAL | Status: DC

## 2014-01-17 ENCOUNTER — Encounter (HOSPITAL_COMMUNITY): Payer: Self-pay | Admitting: Emergency Medicine

## 2014-01-17 ENCOUNTER — Emergency Department (HOSPITAL_COMMUNITY): Payer: Medicare HMO

## 2014-01-17 ENCOUNTER — Emergency Department (HOSPITAL_COMMUNITY)
Admission: EM | Admit: 2014-01-17 | Discharge: 2014-01-17 | Disposition: A | Discharge status: Home / Self Care | Payer: Medicare HMO | Attending: Emergency Medicine | Admitting: Emergency Medicine

## 2014-01-17 DIAGNOSIS — R109 Unspecified abdominal pain: Secondary | ICD-10-CM | POA: Insufficient documentation

## 2014-01-17 DIAGNOSIS — I10 Essential (primary) hypertension: Secondary | ICD-10-CM | POA: Insufficient documentation

## 2014-01-17 DIAGNOSIS — I728 Aneurysm of other specified arteries: Secondary | ICD-10-CM

## 2014-01-17 DIAGNOSIS — E782 Mixed hyperlipidemia: Secondary | ICD-10-CM

## 2014-01-17 DIAGNOSIS — Z880 Allergy status to penicillin: Secondary | ICD-10-CM | POA: Insufficient documentation

## 2014-01-17 DIAGNOSIS — F3289 Other specified depressive episodes: Secondary | ICD-10-CM | POA: Insufficient documentation

## 2014-01-17 DIAGNOSIS — Z87891 Personal history of nicotine dependence: Secondary | ICD-10-CM | POA: Insufficient documentation

## 2014-01-17 DIAGNOSIS — Z79899 Other long term (current) drug therapy: Secondary | ICD-10-CM | POA: Insufficient documentation

## 2014-01-17 DIAGNOSIS — R112 Nausea with vomiting, unspecified: Secondary | ICD-10-CM

## 2014-01-17 DIAGNOSIS — E785 Hyperlipidemia, unspecified: Secondary | ICD-10-CM | POA: Insufficient documentation

## 2014-01-17 DIAGNOSIS — R059 Cough, unspecified: Secondary | ICD-10-CM

## 2014-01-17 DIAGNOSIS — I714 Abdominal aortic aneurysm, without rupture, unspecified: Secondary | ICD-10-CM

## 2014-01-17 DIAGNOSIS — R0602 Shortness of breath: Secondary | ICD-10-CM | POA: Insufficient documentation

## 2014-01-17 DIAGNOSIS — F329 Major depressive disorder, single episode, unspecified: Secondary | ICD-10-CM | POA: Insufficient documentation

## 2014-01-17 DIAGNOSIS — R05 Cough: Secondary | ICD-10-CM | POA: Insufficient documentation

## 2014-01-17 DIAGNOSIS — R1110 Vomiting, unspecified: Secondary | ICD-10-CM | POA: Insufficient documentation

## 2014-01-17 DIAGNOSIS — Z7982 Long term (current) use of aspirin: Secondary | ICD-10-CM | POA: Insufficient documentation

## 2014-01-17 DIAGNOSIS — M129 Arthropathy, unspecified: Secondary | ICD-10-CM | POA: Insufficient documentation

## 2014-01-17 LAB — HEPATIC FUNCTION PANEL
ALBUMIN: 3.6 g/dL (ref 3.5–5.2)
ALT: 7 U/L (ref 0–35)
AST: 16 U/L (ref 0–37)
Alkaline Phosphatase: 83 U/L (ref 39–117)
Bilirubin, Direct: <0.2 mg/dL (ref 0.0–0.3)
TOTAL PROTEIN: 7.6 g/dL (ref 6.0–8.3)
Total Bilirubin: 0.4 mg/dL (ref 0.3–1.2)

## 2014-01-17 LAB — URINALYSIS, ROUTINE W REFLEX MICROSCOPIC
Bilirubin Urine: NEGATIVE
Glucose, UA: NEGATIVE mg/dL
Hgb urine dipstick: NEGATIVE
Ketones, ur: NEGATIVE mg/dL
LEUKOCYTES UA: NEGATIVE
NITRITE: NEGATIVE
PH: 7 (ref 5.0–8.0)
Protein, ur: NEGATIVE mg/dL
Specific Gravity, Urine: 1.005 (ref 1.005–1.030)
Urobilinogen, UA: 0.2 mg/dL (ref 0.0–1.0)

## 2014-01-17 LAB — CBC WITH DIFFERENTIAL/PLATELET
Basophils Absolute: 0 10*3/uL (ref 0.0–0.1)
Basophils Relative: 0 % (ref 0–1)
Eosinophils Absolute: 0 10*3/uL (ref 0.0–0.7)
Eosinophils Relative: 0 % (ref 0–5)
HCT: 36.5 % (ref 36.0–46.0)
HEMOGLOBIN: 12.2 g/dL (ref 12.0–15.0)
LYMPHS PCT: 12 % (ref 12–46)
Lymphs Abs: 1.2 10*3/uL (ref 0.7–4.0)
MCH: 30.2 pg (ref 26.0–34.0)
MCHC: 33.4 g/dL (ref 30.0–36.0)
MCV: 90.3 fL (ref 78.0–100.0)
MONO ABS: 0.6 10*3/uL (ref 0.1–1.0)
Monocytes Relative: 6 % (ref 3–12)
NEUTROS ABS: 8.2 10*3/uL — AB (ref 1.7–7.7)
Neutrophils Relative %: 82 % — ABNORMAL HIGH (ref 43–77)
Platelets: 315 10*3/uL (ref 150–400)
RBC: 4.04 MIL/uL (ref 3.87–5.11)
RDW: 13.7 % (ref 11.5–15.5)
WBC: 10 10*3/uL (ref 4.0–10.5)

## 2014-01-17 LAB — POCT I-STAT TROPONIN I: TROPONIN I, POC: 0 ng/mL (ref 0.00–0.08)

## 2014-01-17 LAB — BASIC METABOLIC PANEL
BUN: 11 mg/dL (ref 6–23)
CHLORIDE: 99 meq/L (ref 96–112)
CO2: 26 meq/L (ref 19–32)
CREATININE: 0.43 mg/dL — AB (ref 0.50–1.10)
Calcium: 9.7 mg/dL (ref 8.4–10.5)
GFR calc Af Amer: >90 mL/min (ref >90)
GFR calc non Af Amer: >90 mL/min (ref >90)
Glucose, Bld: 119 mg/dL — ABNORMAL HIGH (ref 70–99)
Potassium: 3.7 mEq/L (ref 3.7–5.3)
Sodium: 139 mEq/L (ref 137–147)

## 2014-01-17 LAB — CG4 I-STAT (LACTIC ACID): LACTIC ACID, VENOUS: 0.72 mmol/L (ref 0.5–2.2)

## 2014-01-17 LAB — LIPASE, BLOOD: Lipase: 56 U/L (ref 11–59)

## 2014-01-17 MED ORDER — MORPHINE SULFATE 4 MG/ML IJ SOLN
4.0000 mg | Freq: Once | INTRAMUSCULAR | Status: AC
  Administered 2014-01-17: 4 mg via INTRAVENOUS
  Filled 2014-01-17: qty 1

## 2014-01-17 MED ORDER — OMEPRAZOLE 20 MG PO CPDR: 40.0000 mg | DELAYED_RELEASE_CAPSULE | Freq: Two times a day (BID) | ORAL | Status: DC

## 2014-01-17 MED ORDER — ONDANSETRON 8 MG PO TBDP: 8.0000 mg | ORAL_TABLET | Freq: Three times a day (TID) | ORAL | Status: DC | PRN

## 2014-01-17 MED ORDER — DEXTROMETHORPHAN HBR 15 MG/5ML PO SYRP: 10.0000 mL | ORAL_SOLUTION | Freq: Four times a day (QID) | ORAL | Status: DC | PRN

## 2014-01-17 MED ORDER — IOHEXOL 300 MG/ML  SOLN
25.0000 mL | Freq: Once | INTRAMUSCULAR | Status: AC | PRN
  Administered 2014-01-17: 25 mL via ORAL

## 2014-01-17 MED ORDER — HYDROMORPHONE HCL PF 1 MG/ML IJ SOLN
1.0000 mg | Freq: Once | INTRAMUSCULAR | Status: AC
  Administered 2014-01-17: 1 mg via INTRAVENOUS
  Filled 2014-01-17: qty 1

## 2014-01-17 MED ORDER — MORPHINE SULFATE 15 MG PO TABS: 15.0000 mg | ORAL_TABLET | Freq: Four times a day (QID) | ORAL | Status: DC | PRN

## 2014-01-17 MED ORDER — ISOSORB DINITRATE-HYDRALAZINE 20-37.5 MG PO TABS
1.0000 | ORAL_TABLET | Freq: Two times a day (BID) | ORAL | Status: DC
Start: 2014-01-17 — End: 2016-02-28

## 2014-01-17 MED ORDER — ONDANSETRON HCL 4 MG/2ML IJ SOLN
4.0000 mg | Freq: Once | INTRAMUSCULAR | Status: AC
  Administered 2014-01-17: 4 mg via INTRAVENOUS
  Filled 2014-01-17: qty 2

## 2014-01-17 MED ORDER — IPRATROPIUM-ALBUTEROL 20-100 MCG/ACT IN AERS: 1.0000 | INHALATION_SPRAY | Freq: Three times a day (TID) | RESPIRATORY_TRACT | Status: DC

## 2014-01-17 MED ORDER — BENZONATATE 200 MG PO CAPS: 200.0000 mg | ORAL_CAPSULE | Freq: Three times a day (TID) | ORAL | Status: DC | PRN

## 2014-01-17 MED ORDER — IOHEXOL 350 MG/ML SOLN
100.0000 mL | Freq: Once | INTRAVENOUS | Status: AC | PRN
  Administered 2014-01-17: 100 mL via INTRAVENOUS

## 2014-01-17 NOTE — ED Notes (Signed)
Patient transported to CT 

## 2014-01-17 NOTE — Consult Note (Signed)
[facility] Medical Consultation  Nicole REYNOSO Dean:[id number] DOB: [birth year] DOA: 01/17/2014 PCP: No primary provider on file.   Requesting physician: Dr. Rubin Payor Date of consultation: 01/17/14 Reason for consultation: Assistance managing patient symptoms (cough, nausea, vomiting, abd pain and generalized malaise)  Impression/Recommendations 1-abdominal pain, N/V: due to use of tramadol and most likely side effects from use of levaquin on empty stomach -will now stop antibiotics (patient has received 7 days of levaquin, is no febrile and CXR w/o acute cardiopulomonary process) -given fact of probably gastritis from abx's, will increase PPI to BID for now -patient encourage to keep herself well hydrated -PRN zofran prescribed -patient also advise to stop usage of tramadol (as main side effects are N/V)  2-cough: appears to be reactive airway disease at this point. -will discharge with instructions to use combivent -tessalon pearls and robitussin   -patient asked to stop mucinex as there is no need for expectorants at this point -also her lisinopril has been discontinue  3-HTN: given difficulties eating and drinking will stop medications with potential trend to dehydration and renal failure (as precaution) -HCTZ and lisinopril has been discontinue for now -patient started on bidil instead   4-AAA: close follow up with vascular surgery for elective repair. -BP control meanwhile  5-HLD: continue statins  Discharge recommendations:  Keep yourself well hydrated  Take medications as prescribed  Arrange follow up with PCP in 5-7 days  Use tylenol over the counter (500mg  q6h as needed for breakthrough and comfort)  Remember to use your inhaler schedule TID for the next 2 days; after that as needed   Minimize cold exposure and sick contacts  Please follow with vascula surgery as instructed (office will call you with appointment details)  Avoid use of NSAID's  You have  been placed on a different antihypertensive drug (stop lisinopril and HCTZ)  Stop use of Levaquin and Tramadol  *rest of medical problems remains stable and the plan si to continue current medication regimen and for the patient to follow with PCP for medication adjustments.   Chief Complaint: nausea, vomiting, abd pain, cough, generalized malaise  HPI:  73 y/o female with PMH significant for HTN, HLD, depression and arthritis; came to ED complaining of nausea, vomiting, cough, abd pain and generalized malaise for the last 3-4 days or so. Patient reports catching the flu about 2 weeks ago, for which she got treatment and subsequently developed bronchitis after flu infection and patient was treated for that by PCP with 7 days of levaquin. Patient reports not eating or drinking much during this period, but endorses taking her meds faithfully; in fact reported sometimes taking abx's on empty stomach. Today she presented with above complaints, which has not been relieved by use of tramadol and mucinex. Patient reports symptoms has worsen. In ED work up essentially negative; except for incidental finding of AAA measuring 5.8 cm. Vascular surgery consulted (Dr. [name] and recommended close follow up in outpatient setting for arteriogram, cardiac clearance and elective repair). Given uncontrolled of her symptoms [facility] called to evaluate patient for assistance on treatment. Review of Systems:  Negative except as mentioned on HPI.  Past Medical History  Diagnosis Date  . Arthritis   . Depression   . Hyperlipidemia   . Hypertension    History reviewed. No pertinent past surgical history.  Social History:  reports that she quit smoking about 17 years ago. She has never used smokeless tobacco. She reports that she does not drink alcohol or use illicit drugs.  Allergies  Allergen Reactions  . Codeine Nausea And Vomiting  . Penicillins     Per pt-doesn't work maybe some nausea.    Family Hx: positive  just for HTN.  Prior to Admission medications   Medication Sig Start Date End Date Taking? Authorizing Provider  busPIRone (BUSPAR) 15 MG tablet Take 1 tablet (15 mg total) by mouth 2 (two) times daily. 09/19/13 09/19/14 Yes Alexander Althea Charon, DO  sertraline (ZOLOFT) 100 MG tablet Take 1 tablet (100 mg total) by mouth daily. Take with 50mg  tablet. Total dose (150mg  daily) 10/24/13  Yes Saralyn Pilar, DO  sertraline (ZOLOFT) 50 MG tablet Take 1 tablet (50 mg total) by mouth daily. Take with 100mg  tablet (total dose 150mg  daily) 10/24/13  Yes Saralyn Pilar, DO  simvastatin (ZOCOR) 80 MG tablet Take 1 tablet (80 mg total) by mouth daily. 11/08/13  Yes Alexander Althea Charon, DO  traZODone (DESYREL) 100 MG tablet Take 1 tablet (100 mg total) by mouth at bedtime. If needed for sleep. 10/24/13  Yes Alexander Althea Charon, DO  benzonatate (TESSALON) 200 MG capsule Take 1 capsule (200 mg total) by mouth 3 (three) times daily as needed for cough. 01/17/14   Vassie Loll, MD  dextromethorphan 15 MG/5ML syrup Take 10 mLs (30 mg total) by mouth 4 (four) times daily as needed for cough. 01/17/14   Vassie Loll, MD  Ipratropium-Albuterol (COMBIVENT RESPIMAT) 20-100 MCG/ACT AERS respimat Inhale 1 puff into the lungs every 8 (eight) hours. 01/17/14   Vassie Loll, MD  isosorbide-hydrALAZINE (BIDIL) 20-37.5 MG per tablet Take 1 tablet by mouth 2 (two) times daily. 01/17/14   Vassie Loll, MD  morphine (MSIR) 15 MG tablet Take 1 tablet (15 mg total) by mouth every 6 (six) hours as needed for severe pain. 01/17/14   Vassie Loll, MD  omeprazole (PRILOSEC) 20 MG capsule Take 2 capsules (40 mg total) by mouth 2 (two) times daily before a meal. 01/17/14   Vassie Loll, MD  ondansetron (ZOFRAN ODT) 8 MG disintegrating tablet Take 1 tablet (8 mg total) by mouth every 8 (eight) hours as needed for nausea or vomiting. 01/17/14   Vassie Loll, MD   Physical Exam: Blood pressure 164/71, pulse 75, temperature  98.7 F (37.1 C), temperature source Oral, resp. rate 18, SpO2 97.00%. Filed Vitals:   01/17/14 1425  BP: 164/71  Pulse: 75  Temp:   Resp: 18     General: afebrile, AAOX3, no acute distress; able to speak in full sentences. O2 sat > 92% on RA  Eyes: PERRL, EOMI, no icterus or nystagmus  ENT: no erythem aor exudates inside her mouth; mild dryness on MM, no drainage out of ears or nostrils  Neck: no JVD, no thyromegaly  Cardiovascular: S1 and S2; no rubs or gallops, no murmur   Respiratory: no wheezing, scattered rhonchi, no rales or crackles  Abdomen: soft, mild tenderness on right flank area; healing bruise in her left lower quadrant area (patient denies trauma); no distension, positive BS; positive mid abd bruit; no guarding or rebound  Skin: as mentioned above, only abnormality is bruise in left lower quadrant area  Musculoskeletal:FROM, no joint swelling  Psychiatric: stable mood, no SI, appropriate  Neurologic: MS 4/5 bilaterally due to poor effort, no focal neurologic deficit, rest of exam grossly intact  Labs on Admission:  Basic Metabolic Panel:  Recent Labs Lab 01/17/14 0646  NA 139  K 3.7  CL 99  CO2 26  GLUCOSE 119*  BUN 11  CREATININE 0.43*  CALCIUM 9.7  Liver Function Tests:  Recent Labs Lab 01/17/14 0646  AST 16  ALT 7  ALKPHOS 83  BILITOT 0.4  PROT 7.6  ALBUMIN 3.6    Recent Labs Lab 01/17/14 0646  LIPASE 56   CBC:  Recent Labs Lab 01/17/14 0646  WBC 10.0  NEUTROABS 8.2*  HGB 12.2  HCT 36.5  MCV 90.3  PLT 315    Radiological Exams on Admission: Dg Chest 2 View  01/17/2014   CLINICAL DATA:  Chest pain  EXAM: CHEST  2 VIEW  COMPARISON:  January 10, 2014  FINDINGS: There is subsegmental atelectasis in the lung bases. The lungs are otherwise clear. The heart is upper normal in size with normal pulmonary vascularity. No adenopathy. Bones are osteoporotic. There is atherosclerotic change in the aortic arch region.  IMPRESSION:  Patchy bibasilar atelectatic change. No edema or consolidation. Atherosclerotic calcification apparent.   Electronically Signed   By: Bretta Bang M.D.   On: 01/17/2014 08:13   Ct Angio Abd/pel W/ And/or W/o  01/17/2014   CLINICAL DATA:  Abdominal bruit, pain.  EXAM: CT ANGIOGRAPHY ABDOMEN AND PELVIS  TECHNIQUE: Multidetector CT imaging of the abdomen and pelvis was performed using the standard protocol during bolus administration of intravenous contrast. Multiplanar reconstructed images including MIPs were obtained and reviewed to evaluate the vascular anatomy.  CONTRAST:  OMNIPAQUE IOHEXOL 350 MG/ML SOLN  FINDINGS: ARTERIAL FINDINGS:  Patchy coronary calcifications.  Aorta: Visualized distal descending thoracic and suprarenal segments are ectatic with scattered calcified plaque. Distal descending thoracic segment measures up to 3.7 cm diameter. No dissection. There is a fusiform infrarenal aneurysm measured up to 5.3 x 5.8 transverse dimensions, tapering to a diameter of 16 mm just above the bifurcation. There is a moderate amount of eccentric nonocclusive mural thrombus. No evidence of hemorrhage nor stigmata of impending rupture.  Celiac axis: Calcified plaque at the origin resulting in Short segment high-grade stenosis. Patent distally, unremarkable distal branching.  Superior mesenteric: Calcified ostial plaque resulting in short segment stenosis of at least moderate severity, with some eccentric nonocclusive plaque distally, classic distal branch anatomy.  Left renal: Calcified ostial plaque resulting in short segment stenosis of least moderate severity, patent distally.  Right renal: Calcified ostial plaque, resulting in short segment stenosis of at least moderate severity, patent distally.  Inferior mesenteric: Origin occlusion, reconstituted distally by visceral collaterals.  Left iliac: Heavily calcified plaque extending from the origin at least 2.5 cm resulting in short segment ostial  moderately severe stenosis. There is eccentric plaque in the distal common iliac. External iliac has a mild plaque without stenosis or aneurysm. Internal iliac artery unremarkable.  Right iliac: Origin stenosis secondary to heavily calcified plaque. There is a fusiform aneurysm of the mid common iliac up to 2.1 cm maximum transverse diameter, partially thrombosed. There is eccentric nonocclusive plaque in the internal and external iliac arteries without stenosis or aneurysm.  Venous findings: Complete venous phase imaging not obtained. Note made of patent hepatic veins, portal vein, splenic vein, bilateral renal veins. There is a circumaortic left renal vein, an anatomic variant.  Review of the MIP images confirms the above findings.  Nonvascular findings: Linear scarring or subsegmental atelectasis in the inferior right middle lobe and posterior aspect of visualized lower lobes. Small partially calcified stones in the dependent aspect of the physiologically distended gallbladder. Unremarkable arterial phase evaluation of liver, spleen, adrenal glands, pancreas. Small bilateral low-attenuation renal lesions presumably cysts but incompletely assessed. No hydronephrosis. Stomach, small bowel, and colon  are nondilated. Normal appendix. Multiple sigmoid diverticula without adjacent inflammatory/edematous change urinary bladder incompletely distended. Uterus and adnexal regions unremarkable. No ascites. No free air. No adenopathy. Multilevel lumbar spondylitic changes with degenerative disc disease at all levels. There is grade 1/2 anterolisthesis L4-5 probably secondary to advanced facet disease at this level ; no pars defect.  IMPRESSION: 1. 5.8 cm fusiform infrarenal abdominal aortic aneurysm without rupture. 2. Origin stenoses of celiac axis, superior mesenteric artery, and bilateral renal arteries, of probable hemodynamic significance, possibly source of abdominal bruit on physical exam. 3. 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 Correlate with clinical symptomatology and ABIs. 4. 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 5. Cholelithiasis. 6. Circumaortic left renal vein. 7. Sigmoid diverticulosis.   Electronically Signed   By: Oley Balmaniel  Hassell M.D.   On: 01/17/2014 09:22    Time spent: 60 minutes  Tiphani Mells [facility] Pager [phone]  If 7PM-7AM, please contact night-coverage [email or web address] Password [email or web address] 01/17/2014, 2:35 PM

## 2014-01-17 NOTE — ED Notes (Addendum)
Pt has had URI cough for ~ 5 weeks. tx'd for same with levaquin. Was coughing ~3 weeks ago and developed abd pain. Bruising developed gradually progressively over LLQ. abd pain is diffuse. abd soft and TTP. Also admits to sob, night sweats, nv constipation. (Denies: CP, urinary sx, vaginal sx, bleeding, fever, diarrhea). Last BM 2d ago, last emesis 0610 PTA, last ate 1045 yesterday morning. Alert, NAD, calm, interactive, resps e/u, speaking in clear complete sentences.  BS hyperactive x4 quads. Tried tramadol PTA w/o relief[facility]

## 2014-01-17 NOTE — ED Notes (Signed)
Patient ambulatory to the restroom.

## 2014-01-17 NOTE — ED Provider Notes (Signed)
CSN: [id number]     Arrival date & time 01/17/14  [id number] History   First MD Initiated Contact with Patient 01/17/14 0617     No chief complaint on file.    (Consider location/radiation/quality/duration/timing/severity/associated sxs/prior Treatment) HPI Comments: Patient presents to the ED with a chief complaint of abdominal pain.  She states that she has had the pain for the past several weeks, but that it recently worsened yesterday.  She also reports SOB, weakness, vomiting, and cough.  She denies chest pain, fever, dysuria, or vaginal discharge.  She denies any abdominal surgery history.  She states that the pain is 8/10.  Nothing makes her symptoms better or worse.  The history is provided by the patient. No language interpreter was used.    Past Medical History  Diagnosis Date  . Arthritis   . Depression   . Hyperlipidemia   . Hypertension    No past surgical history on file. No family history on file. History  Substance Use Topics  . Smoking status: Former Smoker    Quit date: 02/15/1996  . Smokeless tobacco: Never Used  . Alcohol Use: No   OB History   Grav Para Term Preterm Abortions TAB SAB Ect Mult Living                 Review of Systems  All other systems reviewed and are negative.      Allergies  Penicillins  Home Medications   Current Outpatient Rx  Name  Route  Sig  Dispense  Refill  . aspirin 81 MG EC tablet   Oral   Take 81 mg by mouth daily.           . busPIRone (BUSPAR) 15 MG tablet   Oral   Take 1 tablet (15 mg total) by mouth 2 (two) times daily.   60 tablet   6     For anxiety   . EXPIRED: cetirizine (ZYRTEC ALLERGY) 10 MG tablet   Oral   Take 1 tablet (10 mg total) by mouth daily.   30 tablet   11   . enalapril (VASOTEC) 5 MG tablet   Oral   Take 1 tablet (5 mg total) by mouth daily.   90 tablet   2   . hydrochlorothiazide (HYDRODIURIL) 25 MG tablet   Oral   Take 1 tablet (25 mg total) by mouth daily.   90 tablet    2   . EXPIRED: mometasone (NASONEX) 50 MCG/ACT nasal spray   Nasal   Place 2 sprays into the nose daily.   17 g   2   . omeprazole (PRILOSEC) 20 MG capsule   Oral   Take 1 capsule (20 mg total) by mouth 2 (two) times daily before a meal.   180 capsule   2   . sertraline (ZOLOFT) 100 MG tablet   Oral   Take 1 tablet (100 mg total) by mouth daily. Take with 50mg  tablet. Total dose (150mg  daily)   90 tablet   2   . sertraline (ZOLOFT) 50 MG tablet   Oral   Take 1 tablet (50 mg total) by mouth daily. Take with 100mg  tablet (total dose 150mg  daily)   90 tablet   2   . simvastatin (ZOCOR) 80 MG tablet   Oral   Take 1 tablet (80 mg total) by mouth daily.   90 tablet   2   . traZODone (DESYREL) 100 MG tablet   Oral   Take 1  tablet (100 mg total) by mouth at bedtime. If needed for sleep.   90 tablet   2    BP 144/55  Pulse 88  Temp(Src) 98.7 F (37.1 C) (Oral)  Resp 20  SpO2 94% Physical Exam  Nursing note and vitals reviewed. Constitutional: She is oriented to person, place, and time. She appears well-developed and well-nourished.  HENT:  Head: Normocephalic and atraumatic.  Eyes: Conjunctivae and EOM are normal. Pupils are equal, round, and reactive to light.  Neck: Normal range of motion. Neck supple.  Cardiovascular: Normal rate and regular rhythm.  Exam reveals no gallop and no friction rub.   No murmur heard. Abdominal bruits  Pulmonary/Chest: Effort normal and breath sounds normal. No respiratory distress. She has no wheezes. She has no rales. She exhibits no tenderness.  Abdominal: Soft. Bowel sounds are normal. She exhibits no distension and no mass. There is tenderness. There is no rebound and no guarding.    Musculoskeletal: Normal range of motion. She exhibits no edema and no tenderness.  Neurological: She is alert and oriented to person, place, and time.  Skin: Skin is warm and dry.     Bruising over the left lower quadrant  Psychiatric: She has a  normal mood and affect. Her behavior is normal. Judgment and thought content normal.    ED Course  Procedures (including critical care time) Results for orders placed during the hospital encounter of 01/17/14  CBC WITH DIFFERENTIAL      Result Value Ref Range   WBC 10.0  4.0 - 10.5 K/uL   RBC 4.04  3.87 - 5.11 MIL/uL   Hemoglobin 12.2  12.0 - 15.0 g/dL   HCT 16.1  09.6 - 04.5 %   MCV 90.3  78.0 - 100.0 fL   MCH 30.2  26.0 - 34.0 pg   MCHC 33.4  30.0 - 36.0 g/dL   RDW 40.9  81.1 - 91.4 %   Platelets 315  150 - 400 K/uL   Neutrophils Relative % 82 (*) 43 - 77 %   Neutro Abs 8.2 (*) 1.7 - 7.7 K/uL   Lymphocytes Relative 12  12 - 46 %   Lymphs Abs 1.2  0.7 - 4.0 K/uL   Monocytes Relative 6  3 - 12 %   Monocytes Absolute 0.6  0.1 - 1.0 K/uL   Eosinophils Relative 0  0 - 5 %   Eosinophils Absolute 0.0  0.0 - 0.7 K/uL   Basophils Relative 0  0 - 1 %   Basophils Absolute 0.0  0.0 - 0.1 K/uL  BASIC METABOLIC PANEL      Result Value Ref Range   Sodium 139  137 - 147 mEq/L   Potassium 3.7  3.7 - 5.3 mEq/L   Chloride 99  96 - 112 mEq/L   CO2 26  19 - 32 mEq/L   Glucose, Bld 119 (*) 70 - 99 mg/dL   BUN 11  6 - 23 mg/dL   Creatinine, Ser 7.82 (*) 0.50 - 1.10 mg/dL   Calcium 9.7  8.4 - 95.6 mg/dL   GFR calc non Af Amer >90  >90 mL/min   GFR calc Af Amer >90  >90 mL/min  URINALYSIS, ROUTINE W REFLEX MICROSCOPIC      Result Value Ref Range   Color, Urine YELLOW  YELLOW   APPearance CLEAR  CLEAR   Specific Gravity, Urine 1.005  1.005 - 1.030   pH 7.0  5.0 - 8.0   Glucose, UA NEGATIVE  NEGATIVE mg/dL   Hgb urine dipstick NEGATIVE  NEGATIVE   Bilirubin Urine NEGATIVE  NEGATIVE   Ketones, ur NEGATIVE  NEGATIVE mg/dL   Protein, ur NEGATIVE  NEGATIVE mg/dL   Urobilinogen, UA 0.2  0.0 - 1.0 mg/dL   Nitrite NEGATIVE  NEGATIVE   Leukocytes, UA NEGATIVE  NEGATIVE  HEPATIC FUNCTION PANEL      Result Value Ref Range   Total Protein 7.6  6.0 - 8.3 g/dL   Albumin 3.6  3.5 - 5.2 g/dL   AST  16  0 - 37 U/L   ALT 7  0 - 35 U/L   Alkaline Phosphatase 83  39 - 117 U/L   Total Bilirubin 0.4  0.3 - 1.2 mg/dL   Bilirubin, Direct <1.6  0.0 - 0.3 mg/dL   Indirect Bilirubin NOT CALCULATED  0.3 - 0.9 mg/dL  LIPASE, BLOOD      Result Value Ref Range   Lipase 56  11 - 59 U/L  POCT I-STAT TROPONIN I      Result Value Ref Range   Troponin i, poc 0.00  0.00 - 0.08 ng/mL   Comment 3           CG4 I-STAT (LACTIC ACID)      Result Value Ref Range   Lactic Acid, Venous 0.72  0.5 - 2.2 mmol/L   Dg Chest 2 View  01/17/2014   CLINICAL DATA:  Chest pain  EXAM: CHEST  2 VIEW  COMPARISON:  January 10, 2014  FINDINGS: There is subsegmental atelectasis in the lung bases. The lungs are otherwise clear. The heart is upper normal in size with normal pulmonary vascularity. No adenopathy. Bones are osteoporotic. There is atherosclerotic change in the aortic arch region.  IMPRESSION: Patchy bibasilar atelectatic change. No edema or consolidation. Atherosclerotic calcification apparent.   Electronically Signed   By: Bretta Bang M.D.   On: 01/17/2014 08:13   Ct Angio Abd/pel W/ And/or W/o  01/17/2014   CLINICAL DATA:  Abdominal bruit, pain.  EXAM: CT ANGIOGRAPHY ABDOMEN AND PELVIS  TECHNIQUE: Multidetector CT imaging of the abdomen and pelvis was performed using the standard protocol during bolus administration of intravenous contrast. Multiplanar reconstructed images including MIPs were obtained and reviewed to evaluate the vascular anatomy.  CONTRAST:  OMNIPAQUE IOHEXOL 350 MG/ML SOLN  FINDINGS: ARTERIAL FINDINGS:  Patchy coronary calcifications.  Aorta: Visualized distal descending thoracic and suprarenal segments are ectatic with scattered calcified plaque. Distal descending thoracic segment measures up to 3.7 cm diameter. No dissection. There is a fusiform infrarenal aneurysm measured up to 5.3 x 5.8 transverse dimensions, tapering to a diameter of 16 mm just above the bifurcation. There is a  moderate amount of eccentric nonocclusive mural thrombus. No evidence of hemorrhage nor stigmata of impending rupture.  Celiac axis: Calcified plaque at the origin resulting in Short segment high-grade stenosis. Patent distally, unremarkable distal branching.  Superior mesenteric: Calcified ostial plaque resulting in short segment stenosis of at least moderate severity, with some eccentric nonocclusive plaque distally, classic distal branch anatomy.  Left renal: Calcified ostial plaque resulting in short segment stenosis of least moderate severity, patent distally.  Right renal: Calcified ostial plaque, resulting in short segment stenosis of at least moderate severity, patent distally.  Inferior mesenteric: Origin occlusion, reconstituted distally by visceral collaterals.  Left iliac: Heavily calcified plaque extending from the origin at least 2.5 cm resulting in short segment ostial moderately severe stenosis. There is eccentric plaque in the distal common iliac. External  iliac has a mild plaque without stenosis or aneurysm. Internal iliac artery unremarkable.  Right iliac: Origin stenosis secondary to heavily calcified plaque. There is a fusiform aneurysm of the mid common iliac up to 2.1 cm maximum transverse diameter, partially thrombosed. There is eccentric nonocclusive plaque in the internal and external iliac arteries without stenosis or aneurysm.  Venous findings: Complete venous phase imaging not obtained. Note made of patent hepatic veins, portal vein, splenic vein, bilateral renal veins. There is a circumaortic left renal vein, an anatomic variant.  Review of the MIP images confirms the above findings.  Nonvascular findings: Linear scarring or subsegmental atelectasis in the inferior right middle lobe and posterior aspect of visualized lower lobes. Small partially calcified stones in the dependent aspect of the physiologically distended gallbladder. Unremarkable arterial phase evaluation of liver, spleen,  adrenal glands, pancreas. Small bilateral low-attenuation renal lesions presumably cysts but incompletely assessed. No hydronephrosis. Stomach, small bowel, and colon are nondilated. Normal appendix. Multiple sigmoid diverticula without adjacent inflammatory/edematous change urinary bladder incompletely distended. Uterus and adnexal regions unremarkable. No ascites. No free air. No adenopathy. Multilevel lumbar spondylitic changes with degenerative disc disease at all levels. There is grade 1/2 anterolisthesis L4-5 probably secondary to advanced facet disease at this level ; no pars defect.  IMPRESSION: 1. 5.8 cm fusiform infrarenal abdominal aortic aneurysm without rupture. 2. Origin stenoses of celiac axis, superior mesenteric artery, and bilateral renal arteries, of probable hemodynamic significance, possibly source of abdominal bruit on physical exam. 3. 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 Correlate with clinical symptomatology and ABIs. 4. 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 5. Cholelithiasis. 6. Circumaortic left renal vein. 7. Sigmoid diverticulosis.   Electronically Signed   By: Oley Balm M.D.   On: 01/17/2014 09:22     EKG Interpretation   None       MDM   Final diagnoses:  Abdominal pain  Cough    Patient with SOB, cough, vomiting, and abdominal pain.  Will check labs, CXR, EKG, and CT abd.  86% on room air.  Will start 2L [place].  8:35 AM Discussed the patient with Dr. Rubin Payor.  Will change from regular CT abd/pel to CT angio, as the patient has abdominal bruits with no known history of bruits.  Labs thus far are reassuring.  Patient seen by and discussed with Dr. Rubin Payor.  CT scan results reviewed together.  Will consult vascular surgery.  I discussed the patient with Dr. Durwin Nora from vascular, who will see the patient. 12:02 PM  Patient maintains 95% on 2L [place], when I turn off the oxygen she desats  to 86-88% after 2-3 minutes.  Discussed the patient with Dr. Ardyth Harps, who will see the patient.  3:16 PM Patient seen by the hospitalist team. Dr. Gwenlyn Perking tells me the patient was maintaining oxygen saturation on room air during his evaluation, and findings patient stable for discharge to home. The plan is been discussed in detail with the patient. She understands and agrees with the plan. She is stable and her for discharge. Please refer to Dr. Ozella Almond note for further discharge instructions and information.  Roxy Horseman, PA-C 01/17/14 1517

## 2014-01-17 NOTE — ED Notes (Signed)
Admitting md at bedside

## 2014-01-17 NOTE — Consult Note (Signed)
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 Consult from the emergency department  HPI: Nicole Dean is a 73 y.o. female who developed flu like symptoms for 5 weeks ago. She has had persistent nausea, vomiting, and cough. She pulled a muscle on the left side of her abdomen which was associated with significant ecchymosis. She's had some soreness for the last several weeks which is constant on the right side of her abdomen. She continues to have nausea and vomiting intermittently. She tells me that there was some concern that she might have a pneumonia and she was being treated for this also. She denies any acute onset abdominal pain or back pain. She does state that she occasionally coughs up some blood in she denies hematuria. She also describes a previous fever and also occasional chills.  She does describe occasional postprandial abdominal pain but states that this is been going on for years. It does not consistently occurred after eating. She does state that she has lost 10-12 pounds over the last 5 weeks since she has been sick in not needing well.  Past Medical History  Diagnosis Date  . Arthritis   . Depression   . Hyperlipidemia   . Hypertension    No family history on file. FAMILY HISTORY: The patient denies any history of premature cardiovascular disease. She denies any history of aneurysmal disease in the family.  SOCIAL HISTORY: History  Substance Use Topics  . Smoking status: Former Smoker    Quit date: 02/15/1996  . Smokeless tobacco: Never Used  . Alcohol Use: No   She smoked 1-1/2-2 packs per day for approximately 30 years but quit 15 years ago.she is married and has 4 children and 4 grandchildren.  Allergies  Allergen Reactions  . Codeine Nausea And Vomiting  . Penicillins     Per  pt-doesn't work maybe some nausea.    No current facility-administered medications for this encounter.   Current Outpatient Prescriptions  Medication Sig Dispense Refill  . busPIRone (BUSPAR) 15 MG tablet Take 1 tablet (15 mg total) by mouth 2 (two) times daily.  60 tablet  6  . enalapril (VASOTEC) 5 MG tablet Take 1 tablet (5 mg total) by mouth daily.  90 tablet  2  . hydrochlorothiazide (HYDRODIURIL) 25 MG tablet Take 1 tablet (25 mg total) by mouth daily.  90 tablet  2  . levofloxacin (LEVAQUIN) 500 MG tablet Take 1 tablet by mouth 2 (two) times daily.      Marland Kitchen. omeprazole (PRILOSEC) 20 MG capsule Take 1 capsule (20 mg total) by mouth 2 (two) times daily before a meal.  180 capsule  2  . sertraline (ZOLOFT) 100 MG tablet Take 1 tablet (100 mg total) by mouth daily. Take with 50mg  tablet. Total dose (150mg  daily)  90 tablet  2  . sertraline (ZOLOFT) 50 MG tablet Take 1 tablet (50 mg total) by mouth daily. Take with 100mg  tablet (total dose 150mg  daily)  90 tablet  2  . simvastatin (ZOCOR) 80 MG tablet Take 1 tablet (80 mg total) by mouth daily.  90 tablet  2  . traZODone (DESYREL) 100 MG tablet Take 1 tablet (100 mg total) by mouth at bedtime. If needed for sleep.  90 tablet  2   REVIEW OF SYSTEMS: Arly.Keller[X ] denotes positive finding; [  ] denotes negative  finding CARDIOVASCULAR:  [ ]  chest pain   [ ]  chest pressure   [ ]  palpitations   [ ]  orthopnea   Arly.Keller ] dyspnea on exertion   Arly.Keller ] claudication   [ ]  rest pain   [ ]  DVT   [ ]  phlebitis PULMONARY:   Arly.Keller ] productive cough   [ ]  asthma   [ ]  wheezing NEUROLOGIC:   [ ]  weakness  [ ]  paresthesias  [ ]  aphasia  [ ]  amaurosis  [ ]  dizziness HEMATOLOGIC:   [ ]  bleeding problems   [ ]  clotting disorders MUSCULOSKELETAL:  [ ]  joint pain   [ ]  joint swelling [ ]  leg swelling GASTROINTESTINAL: [ ]   blood in stool  [ ]   hematemesis GENITOURINARY:  [ ]   dysuria  [ ]   hematuria PSYCHIATRIC:  [ ]  history of major depression INTEGUMENTARY:  [ ]  rashes  [ ]   ulcers CONSTITUTIONAL:  Arly.Keller ] fever   Arly.Keller ] chills  PHYSICAL EXAM: Filed Vitals:   01/17/14 0620 01/17/14 1000 01/17/14 1030 01/17/14 1114  BP: 144/55 151/75 164/78 155/86  Pulse: 88 81 80 88  Temp: 98.7 F (37.1 C)     TempSrc: Oral     Resp: 20 15 15 18   SpO2: 94% 93% 95% 92%   There is no weight on file to calculate BMI. GENERAL: The patient is a well-nourished female, in no acute distress. She is moderately obese.The vital signs are documented above. CARDIOVASCULAR: There is a regular rate and rhythm. I do not detect carotid bruits. She has a diminished right femoral pulse. She has a palpable left femoral pulse. She has a palpable but diminished right dorsalis pedis pulse. I cannot palpate pulses in the left foot. She has no significant lower extremity swelling. PULMONARY: There is good air exchange bilaterally without wheezing or rales. ABDOMEN: Soft and non-tender with normal pitched bowel sounds. Her aneurysm is palpable and nontender. MUSCULOSKELETAL: There are no major deformities or cyanosis. NEUROLOGIC: No focal weakness or paresthesias are detected. SKIN: There are no ulcers or rashes noted. PSYCHIATRIC: The patient has a normal affect.  DATA:  Lab Results  Component Value Date   WBC 10.0 01/17/2014   HGB 12.2 01/17/2014   HCT 36.5 01/17/2014   MCV 90.3 01/17/2014   PLT 315 01/17/2014   Lab Results  Component Value Date   NA 139 01/17/2014   K 3.7 01/17/2014   CL 99 01/17/2014   CO2 26 01/17/2014   Lab Results  Component Value Date   CREATININE 0.43* 01/17/2014   CT ANGIOGRAM: I have reviewed her CT scan. She has a 5.8 cm infrarenal abdominal aortic aneurysm. There is no evidence of leak. The neck of the aneurysm appears quite short and she may not be a candidate for endovascular repair. In addition she has significant occlusive disease in her right common iliac artery and a small aneurysm on the right. By my interpretation, the celiac axis and superior mesenteric artery  appear to be open although radiology interprets these are showing some significant disease. The IMA does appear to be occluded proximally. She does have a circumaortic left renal vein.  MEDICAL ISSUES:  ABDOMINAL AORTIC ANEURYSM: This patient has a 5.8 cm infrarenal abdominal aortic aneurysm. I do not think that this explains her current symptoms which have been going on for 4-5 weeks. Etiology of her symptoms is not clear and she complains of fever intermittent chills nausea vomiting and cough. Given the size of  the aneurysm I would recommend elective repair. I have explained to her and her son that we would normally consider elective repair and a normal risk patient at 5.5 cm. Based on her CT scan she may not be a candidate for endovascular aneurysm repair and may require open repair. I recommended that we arrange for elective arteriogram to further determine if she is a candidate for EVAR. In addition she will require preoperative cardiac clearance. Arteriography will also allow [place] to better assess her celiac axis and superior mesenteric artery. I do not think that her current symptoms can be explained by this is I am underwhelming despite the findings on CT scan and also I do not think her symptoms are consistent with mesenteric ischemia.  I will arrange for an arteriogram and cardiac evaluation before arranging for elective repair of her 5.8 cm infrarenal abdominal aortic aneurysm.  Pura Picinich S [facility] [place] [facility]: [phone]

## 2014-01-17 NOTE — ED Notes (Signed)
md at bedside to eval pt 

## 2014-01-17 NOTE — Discharge Instructions (Signed)
Please discontinue taking your lisinopril. Please take pain medicine only as needed. You have been given a new blood pressure medicine to replace the lisinopril. Lisinopril can cause cough.  Abdominal Pain, Adult Many things can cause abdominal pain. Usually, abdominal pain is not caused by a disease and will improve without treatment. It can often be observed and treated at home. Your health care provider will do a physical exam and possibly order blood tests and X-rays to help determine the seriousness of your pain. However, in many cases, more time must pass before a clear cause of the pain can be found. Before that point, your health care provider may not know if you need more testing or further treatment. HOME CARE INSTRUCTIONS  Monitor your abdominal pain for any changes. The following actions may help to alleviate any discomfort you are experiencing:  Only take over-the-counter or prescription medicines as directed by your health care provider.  Do not take laxatives unless directed to do so by your health care provider.  Try a clear liquid diet (broth, tea, or water) as directed by your health care provider. Slowly move to a bland diet as tolerated. SEEK MEDICAL CARE IF:  You have unexplained abdominal pain.  You have abdominal pain associated with nausea or diarrhea.  You have pain when you urinate or have a bowel movement.  You experience abdominal pain that wakes you in the night.  You have abdominal pain that is worsened or improved by eating food.  You have abdominal pain that is worsened with eating fatty foods. SEEK IMMEDIATE MEDICAL CARE IF:   Your pain does not go away within 2 hours.  You have a fever.  You keep throwing up (vomiting).  Your pain is felt only in portions of the abdomen, such as the right side or the left lower portion of the abdomen.  You pass bloody or black tarry stools. MAKE SURE YOU:  Understand these instructions.   Will watch your  condition.   Will get help right away if you are not doing well or get worse.  Document Released: 08/27/2005 Document Revised: 09/07/2013 Document Reviewed: 07/27/2013 [facility] Patient Information 2014 [place], [place].

## 2014-01-17 NOTE — ED Provider Notes (Signed)
Medical screening examination/treatment/procedure(s) were performed by non-physician practitioner and as supervising physician I was immediately available for consultation/collaboration.  EKG Interpretation    Date/Time:  Tuesday January 17 2014 07:09:28 EST Ventricular Rate:  91 PR Interval:  165 QRS Duration: 90 QT Interval:  376 QTC Calculation: 463 R Axis:   58 Text Interpretation:  Sinus rhythm Atrial premature complex Probable anteroseptal infarct, old No significant change since last tracing Confirmed by Rubin PayorPICKERING  MD, Manford Sprong ([id number]) on 01/17/2014 8:16:48 AM             Juliet RudeNathan R. Rubin PayorPickering, MD 01/17/14 [phone]

## 2014-02-03 ENCOUNTER — Telehealth: Payer: Self-pay

## 2014-02-03 NOTE — Telephone Encounter (Signed)
Phone call placed to pt. to f/u from the ER consult by Dr. Edilia Boickson on 01/17/14.  Informed pt. that based on the consult note from Dr. Edilia Boickson, it was recommended to schedule pt. for an elective arteriogram and pre-op cardiac evaluation, preceeding scheduling an elective repair of AAA.  Pt. stated she does not want to schedule an arteriogram, and wants to wait awhile to schedule this.  Also, declined having a cardiac evaluation scheduled at this time.  Gave pt. the office number, and requested that she call back, when she is ready to schedule the above.  Verb. understanding.  Agreed.

## 2014-02-21 ENCOUNTER — Encounter: Payer: Self-pay | Admitting: Vascular Surgery

## 2014-02-22 ENCOUNTER — Ambulatory Visit: Payer: Medicare Other | Admitting: Vascular Surgery

## 2014-08-01 ENCOUNTER — Ambulatory Visit: Payer: Medicare HMO | Attending: Family Medicine | Admitting: Occupational Therapy

## 2014-08-01 DIAGNOSIS — G5600 Carpal tunnel syndrome, unspecified upper limb: Secondary | ICD-10-CM | POA: Insufficient documentation

## 2014-08-01 DIAGNOSIS — IMO0001 Reserved for inherently not codable concepts without codable children: Secondary | ICD-10-CM | POA: Insufficient documentation

## 2014-08-10 ENCOUNTER — Ambulatory Visit: Payer: Medicare HMO | Admitting: Occupational Therapy

## 2014-08-10 ENCOUNTER — Encounter: Payer: Medicare HMO | Admitting: Occupational Therapy

## 2014-08-10 DIAGNOSIS — IMO0001 Reserved for inherently not codable concepts without codable children: Secondary | ICD-10-CM | POA: Diagnosis not present

## 2014-08-14 ENCOUNTER — Ambulatory Visit: Payer: Medicare HMO | Admitting: Occupational Therapy

## 2014-08-14 DIAGNOSIS — IMO0001 Reserved for inherently not codable concepts without codable children: Secondary | ICD-10-CM | POA: Diagnosis not present

## 2014-08-17 ENCOUNTER — Encounter: Payer: Medicare HMO | Admitting: Occupational Therapy

## 2014-08-21 ENCOUNTER — Ambulatory Visit: Payer: Medicare HMO | Admitting: Occupational Therapy

## 2014-08-21 DIAGNOSIS — IMO0001 Reserved for inherently not codable concepts without codable children: Secondary | ICD-10-CM | POA: Diagnosis not present

## 2014-08-24 ENCOUNTER — Encounter: Payer: Medicare HMO | Admitting: Occupational Therapy

## 2014-08-28 ENCOUNTER — Ambulatory Visit: Payer: Medicare HMO | Admitting: Occupational Therapy

## 2014-08-28 DIAGNOSIS — IMO0001 Reserved for inherently not codable concepts without codable children: Secondary | ICD-10-CM | POA: Diagnosis not present

## 2014-08-31 ENCOUNTER — Encounter: Payer: Medicare HMO | Admitting: Occupational Therapy

## 2014-09-05 ENCOUNTER — Ambulatory Visit: Payer: Medicare HMO | Attending: Family Medicine | Admitting: Occupational Therapy

## 2014-09-05 DIAGNOSIS — G5601 Carpal tunnel syndrome, right upper limb: Secondary | ICD-10-CM | POA: Insufficient documentation

## 2014-09-18 ENCOUNTER — Ambulatory Visit: Payer: Medicare HMO | Admitting: Occupational Therapy

## 2014-09-18 DIAGNOSIS — G5601 Carpal tunnel syndrome, right upper limb: Secondary | ICD-10-CM | POA: Diagnosis not present

## 2014-09-25 ENCOUNTER — Ambulatory Visit: Payer: Medicare HMO | Admitting: Occupational Therapy

## 2014-09-25 DIAGNOSIS — G5601 Carpal tunnel syndrome, right upper limb: Secondary | ICD-10-CM | POA: Diagnosis not present

## 2016-01-19 DIAGNOSIS — M47814 Spondylosis without myelopathy or radiculopathy, thoracic region: Secondary | ICD-10-CM | POA: Insufficient documentation

## 2016-01-19 DIAGNOSIS — G5603 Carpal tunnel syndrome, bilateral upper limbs: Secondary | ICD-10-CM | POA: Insufficient documentation

## 2016-01-19 DIAGNOSIS — M24541 Contracture, right hand: Secondary | ICD-10-CM | POA: Insufficient documentation

## 2016-02-05 DIAGNOSIS — F3342 Major depressive disorder, recurrent, in full remission: Secondary | ICD-10-CM | POA: Insufficient documentation

## 2016-02-19 ENCOUNTER — Other Ambulatory Visit: Payer: Self-pay | Admitting: Family Medicine

## 2016-02-19 ENCOUNTER — Ambulatory Visit
Admission: RE | Admit: 2016-02-19 | Discharge: 2016-02-19 | Disposition: A | Discharge status: Home / Self Care | Payer: Medicare HMO | Source: Ambulatory Visit | Attending: Family Medicine | Admitting: Family Medicine

## 2016-02-19 DIAGNOSIS — R1031 Right lower quadrant pain: Secondary | ICD-10-CM

## 2016-02-19 MED ORDER — IOPAMIDOL (ISOVUE-300) INJECTION 61%
100.0000 mL | Freq: Once | INTRAVENOUS | Status: AC | PRN
  Administered 2016-02-19: 100 mL via INTRAVENOUS

## 2016-02-25 ENCOUNTER — Encounter: Payer: Self-pay | Admitting: Surgery

## 2016-02-25 ENCOUNTER — Ambulatory Visit (INDEPENDENT_AMBULATORY_CARE_PROVIDER_SITE_OTHER): Payer: Medicare HMO | Admitting: Surgery

## 2016-02-25 ENCOUNTER — Other Ambulatory Visit: Payer: Self-pay

## 2016-02-25 VITALS — BP 114/52 | HR 86 | Temp 99.8°F | Resp 18 | Ht 61.0 in | Wt 142.0 lb

## 2016-02-25 DIAGNOSIS — I714 Abdominal aortic aneurysm, without rupture, unspecified: Secondary | ICD-10-CM

## 2016-02-25 NOTE — Progress Notes (Signed)
Patient name: Nicole Dean MRN: [id number] DOB: 1941-01-18 Sex: female     Chief Complaint  Patient presents with  . AAA    incidental finding. PCP has been monitoring x 2 years    HISTORY OF PRESENT ILLNESS: This is a 75 year old female who was initially evaluated by Dr. Edilia Boickson in the emergency department in 2015 for abdominal pain.  At that time she had a 5.8 cm aneurysm.  It was felt that her pain was not secondary to her aneurysm but rather secondary to infectious issues.  The patient was supposed to undergo angiography to determine whether or not she would be a candidate for endovascular repair.  The patient did not follow-up as she wanted to "ignore this".  She recently had a CAT scan which showed a interval increase in the maximum diameter of her aneurysm, now measuring 6 cm.  She is asymptomatic.  She denies abdominal pain or back pain.  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  She has not had any significant weight loss.  The patient suffers from hypercholesterolemia which is managed with a statin.  She is medically managed for hypertension.  She is a former smoker.  The patient does endorse claudication symptoms which are lifestyle limiting to her.  Her right leg bothers her more than the left.  She denies any cardiac history.  She does not have a cardiologist.  Past Medical History  Diagnosis Date  . Arthritis   . Depression   . Hyperlipidemia   . Hypertension     Past Surgical History  Procedure Laterality Date  . Shoulder arthroscopy w/ rotator cuff repair Right     Social History   Social History  . Marital Status: Divorced    Spouse Name: N/A  . Number of Children: N/A  . Years of Education: N/A   Occupational History  . Not on file.   Social History Main Topics  . Smoking status: Former Smoker    Quit date:  02/15/1996  . Smokeless tobacco: Never Used  . Alcohol Use: No  . Drug Use: No  . Sexual Activity: Not on file   Other Topics Concern  . Not on file   Social History Narrative    Family History  Problem Relation Age of Onset  . Cancer Father     stomach    Allergies as of 02/25/2016 - Review Complete 02/25/2016  Allergen Reaction Noted  . Codeine Nausea And Vomiting 01/17/2014  . Penicillins  03/18/2011    Current Outpatient Prescriptions on File Prior to Visit  Medication Sig Dispense Refill  . benzonatate (TESSALON) 200 MG capsule Take 1 capsule (200 mg total) by mouth 3 (three) times daily as needed for cough. 30 capsule 0  . isosorbide-hydrALAZINE (BIDIL) 20-37.5 MG per tablet Take 1 tablet by mouth 2 (two) times daily. 60 tablet 1  . omeprazole (PRILOSEC) 20 MG capsule Take 2 capsules (40 mg total) by mouth 2 (two) times daily before a meal. 60 capsule 0  . sertraline (ZOLOFT) 100 MG tablet Take 1 tablet (100 mg total) by mouth daily. Take with 50mg  tablet. Total dose (150mg  daily) 90 tablet 2  . simvastatin (ZOCOR) 80 MG tablet Take 1 tablet (80 mg total) by mouth daily. 90 tablet 2  . traZODone (DESYREL) 100 MG tablet Take 1 tablet (100 mg total) by mouth at  bedtime. If needed for sleep. 90 tablet 2  . dextromethorphan 15 MG/5ML syrup Take 10 mLs (30 mg total) by mouth 4 (four) times daily as needed for cough. (Patient not taking: Reported on 02/25/2016) 120 mL 0  . Ipratropium-Albuterol (COMBIVENT RESPIMAT) 20-100 MCG/ACT AERS respimat Inhale 1 puff into the lungs every 8 (eight) hours. (Patient not taking: Reported on 02/25/2016) 1 Inhaler 0  . ondansetron (ZOFRAN ODT) 8 MG disintegrating tablet Take 1 tablet (8 mg total) by mouth every 8 (eight) hours as needed for nausea or vomiting. (Patient not taking: Reported on 02/25/2016) 30 tablet 0   No current facility-administered medications on file prior to visit.     REVIEW OF SYSTEMS: See history of present illness for  pertinent positives and negatives.  Otherwise all systems are negative  PHYSICAL EXAMINATION:   Vital signs are  Filed Vitals:   02/25/16 0927  BP: 114/52  Pulse: 86  Temp: 99.8 F (37.7 C)  TempSrc: Oral  Resp: 18  Height:  (1.549 m)  Weight: 142 lb (64.411 kg)  SpO2: 92%   Body mass index is 26.84 kg/(m^2). General: The patient appears their stated age. HEENT:  No gross abnormalities Pulmonary:  Non labored breathing Abdomen: Soft and non-tender.  Aorta is easily palpated and nontender Musculoskeletal: There are no major deformities. Neurologic: No focal weakness or paresthesias are detected, Skin: There are no ulcer or rashes noted. Psychiatric: The patient has normal affect. Cardiovascular: There is a regular rate and rhythm without significant murmur appreciated.I cannot palpate pedal pulses.  She has palpable femoral pulses, however the left is more pronounced than the right.  No carotid bruits   Diagnostic Studies I have reviewed her CT scan which shows a 6 cm infrarenal abdominal aortic aneurysm.  Her aorta is heavily calcified.  The iliac vessels were not well delineated.  Assessment: Abdominal aortic aneurysm Plan: The patient's aneurysm has grown from 5.8-6.0 cm in a year and a half.  We discussed the risk of rupture.  We also discussed that she may be a candidate for endovascular repair.  She will be scheduled for angiography by Dr. [name] on Monday, April 3 to determine if she is a candidate for endovascular repair.  In addition, she will need bilateral lower extremity runoff to evaluate for the reason for her claudication.  Once this has been done, she will need a discussion regarding endovascular versus open repair versus not treating her aneurysm.  If she elects to have her aneurysm treated, she will need cardiac clearance as well as carotid Dopplers and a baseline lower extremity duplex/ABI.  I will defer definitive treatment to Dr. [name] since he originally  evaluated the patient.  Jorge Ny, M.D. [facility] Office: [phone] Pager:  [phone]

## 2016-03-02 MED ORDER — SODIUM CHLORIDE 0.9 % IV SOLN
INTRAVENOUS | Status: DC
  Administered 2016-03-03: 06:00:00 via INTRAVENOUS

## 2016-03-03 ENCOUNTER — Telehealth: Payer: Self-pay | Admitting: Vascular Surgery

## 2016-03-03 ENCOUNTER — Ambulatory Visit (HOSPITAL_COMMUNITY)
Admission: RE | Admit: 2016-03-03 | Discharge: 2016-03-03 | Disposition: A | Discharge status: Home / Self Care | Payer: Medicare HMO | Source: Ambulatory Visit | Attending: Vascular Surgery | Admitting: Vascular Surgery

## 2016-03-03 ENCOUNTER — Ambulatory Visit (INDEPENDENT_AMBULATORY_CARE_PROVIDER_SITE_OTHER)
Admit: 2016-03-03 | Discharge: 2016-03-03 | Disposition: A | Discharge status: Home / Self Care | Payer: Medicare HMO | Attending: Surgery | Admitting: Surgery

## 2016-03-03 ENCOUNTER — Other Ambulatory Visit: Payer: Self-pay | Admitting: *Deleted

## 2016-03-03 ENCOUNTER — Ambulatory Visit (HOSPITAL_COMMUNITY)
Admit: 2016-03-03 | Discharge: 2016-03-03 | Disposition: A | Discharge status: Home / Self Care | Payer: Medicare HMO | Attending: Surgery | Admitting: Surgery

## 2016-03-03 ENCOUNTER — Encounter (HOSPITAL_COMMUNITY)
Admission: RE | Disposition: A | Discharge status: Home / Self Care | Payer: Self-pay | Source: Ambulatory Visit | Attending: Vascular Surgery

## 2016-03-03 ENCOUNTER — Encounter (HOSPITAL_COMMUNITY): Payer: Self-pay | Admitting: Vascular Surgery

## 2016-03-03 DIAGNOSIS — I10 Essential (primary) hypertension: Secondary | ICD-10-CM | POA: Insufficient documentation

## 2016-03-03 DIAGNOSIS — I714 Abdominal aortic aneurysm, without rupture, unspecified: Secondary | ICD-10-CM

## 2016-03-03 DIAGNOSIS — Z01810 Encounter for preprocedural cardiovascular examination: Secondary | ICD-10-CM

## 2016-03-03 DIAGNOSIS — E7800 Pure hypercholesterolemia, unspecified: Secondary | ICD-10-CM | POA: Diagnosis not present

## 2016-03-03 DIAGNOSIS — I70211 Atherosclerosis of native arteries of extremities with intermittent claudication, right leg: Secondary | ICD-10-CM | POA: Diagnosis not present

## 2016-03-03 DIAGNOSIS — I739 Peripheral vascular disease, unspecified: Secondary | ICD-10-CM | POA: Diagnosis not present

## 2016-03-03 DIAGNOSIS — Z87891 Personal history of nicotine dependence: Secondary | ICD-10-CM | POA: Diagnosis not present

## 2016-03-03 DIAGNOSIS — F329 Major depressive disorder, single episode, unspecified: Secondary | ICD-10-CM | POA: Diagnosis not present

## 2016-03-03 DIAGNOSIS — I70209 Unspecified atherosclerosis of native arteries of extremities, unspecified extremity: Secondary | ICD-10-CM

## 2016-03-03 DIAGNOSIS — Z79899 Other long term (current) drug therapy: Secondary | ICD-10-CM | POA: Insufficient documentation

## 2016-03-03 HISTORY — PX: PERIPHERAL VASCULAR CATHETERIZATION: SHX172C

## 2016-03-03 LAB — POCT ACTIVATED CLOTTING TIME: Activated Clotting Time: 188 seconds

## 2016-03-03 LAB — POCT I-STAT, CHEM 8
BUN: 15 mg/dL (ref 6–20)
CALCIUM ION: 1.2 mmol/L (ref 1.13–1.30)
CHLORIDE: 102 mmol/L (ref 101–111)
CREATININE: 0.7 mg/dL (ref 0.44–1.00)
GLUCOSE: 79 mg/dL (ref 65–99)
HCT: 37 % (ref 36.0–46.0)
Hemoglobin: 12.6 g/dL (ref 12.0–15.0)
Potassium: 3.7 mmol/L (ref 3.5–5.1)
Sodium: 141 mmol/L (ref 135–145)
TCO2: 28 mmol/L (ref 0–100)

## 2016-03-03 SURGERY — ABDOMINAL AORTOGRAM W/LOWER EXTREMITY

## 2016-03-03 MED ORDER — MIDAZOLAM HCL 2 MG/2ML IJ SOLN
INTRAMUSCULAR | Status: DC | PRN
  Administered 2016-03-03: 1 mg via INTRAVENOUS

## 2016-03-03 MED ORDER — HEPARIN (PORCINE) IN NACL 2-0.9 UNIT/ML-% IJ SOLN
INTRAMUSCULAR | Status: DC | PRN
  Administered 2016-03-03: 1000 mL via INTRA_ARTERIAL

## 2016-03-03 MED ORDER — FENTANYL CITRATE (PF) 100 MCG/2ML IJ SOLN
INTRAMUSCULAR | Status: AC
  Filled 2016-03-03: qty 2

## 2016-03-03 MED ORDER — LIDOCAINE HCL (PF) 1 % IJ SOLN
INTRAMUSCULAR | Status: DC | PRN
  Administered 2016-03-03: 20 mL via SUBCUTANEOUS

## 2016-03-03 MED ORDER — HEPARIN SODIUM (PORCINE) 1000 UNIT/ML IJ SOLN
INTRAMUSCULAR | Status: AC
  Filled 2016-03-03: qty 1

## 2016-03-03 MED ORDER — IODIXANOL 320 MG/ML IV SOLN
INTRAVENOUS | Status: DC | PRN
  Administered 2016-03-03: 195 mL via INTRA_ARTERIAL

## 2016-03-03 MED ORDER — MIDAZOLAM HCL 2 MG/2ML IJ SOLN
INTRAMUSCULAR | Status: AC
  Filled 2016-03-03: qty 2

## 2016-03-03 MED ORDER — LIDOCAINE HCL (PF) 1 % IJ SOLN
INTRAMUSCULAR | Status: AC
  Filled 2016-03-03: qty 30

## 2016-03-03 MED ORDER — HEPARIN SODIUM (PORCINE) 1000 UNIT/ML IJ SOLN
INTRAMUSCULAR | Status: DC | PRN
  Administered 2016-03-03: 4000 [IU] via INTRAVENOUS

## 2016-03-03 MED ORDER — HEPARIN (PORCINE) IN NACL 2-0.9 UNIT/ML-% IJ SOLN
INTRAMUSCULAR | Status: AC
  Filled 2016-03-03: qty 1000

## 2016-03-03 MED ORDER — SODIUM CHLORIDE 0.9 % IV SOLN: 1.0000 mL/kg/h | INTRAVENOUS | Status: DC

## 2016-03-03 MED ORDER — FENTANYL CITRATE (PF) 100 MCG/2ML IJ SOLN
INTRAMUSCULAR | Status: DC | PRN
  Administered 2016-03-03: 50 ug via INTRAVENOUS

## 2016-03-03 SURGICAL SUPPLY — 14 items
CATH ANGIO 5F BER2 65CM (CATHETERS) ×3 IMPLANT
CATH ANGIO 5F PIGTAIL 65CM (CATHETERS) ×3 IMPLANT
DEVICE TORQUE .025-.038 (MISCELLANEOUS) ×3 IMPLANT
GUIDEWIRE ANGLED .035X150CM (WIRE) ×3 IMPLANT
KIT ESSENTIALS PG (KITS) ×3 IMPLANT
KIT MICROINTRODUCER STIFF 5F (SHEATH) ×3 IMPLANT
KIT PV (KITS) ×3 IMPLANT
PACK CARDIAC CATHETERIZATION (CUSTOM PROCEDURE TRAY) ×3 IMPLANT
SHEATH PINNACLE 5F 10CM (SHEATH) ×6 IMPLANT
SYR MEDRAD MARK V 150ML (SYRINGE) ×3 IMPLANT
TRANSDUCER W/STOPCOCK (MISCELLANEOUS) ×3 IMPLANT
WIRE BENTSON .035X145CM (WIRE) ×3 IMPLANT
WIRE SPARTACORE .014X190CM (WIRE) ×3 IMPLANT
WIRE TORQFLEX AUST .018X40CM (WIRE) ×9 IMPLANT

## 2016-03-03 NOTE — Discharge Instructions (Signed)

## 2016-03-03 NOTE — Interval H&P Note (Signed)
History and Physical Interval Note:  03/03/2016 7:31 AM  Nicole Dean  has presented today for surgery, with the diagnosis of aaa/pvd  The various methods of treatment have been discussed with the patient and family. After consideration of risks, benefits and other options for treatment, the patient has consented to  Procedure(s): Abdominal Aortogram w/Lower Extremity (N/A) as a surgical intervention .  The patient's history has been reviewed, patient examined, no change in status, stable for surgery.  I have reviewed the patient's chart and labs.  Questions were answered to the patient's satisfaction.     Waverly Ferrariickson, Sophiea Ueda

## 2016-03-03 NOTE — H&P (View-Only) (Signed)
Patient name: Nicole Dean MRN: [id number] DOB: 05-26-1941 Sex: female     Chief Complaint  Patient presents with  . AAA    incidental finding. PCP has been monitoring x 2 years    HISTORY OF PRESENT ILLNESS: This is a 75 year old female who was initially evaluated by Dr. Edilia Boickson in the emergency department in 2015 for abdominal pain.  At that time she had a 5.8 cm aneurysm.  It was felt that her pain was not secondary to her aneurysm but rather secondary to infectious issues.  The patient was supposed to undergo angiography to determine whether or not she would be a candidate for endovascular repair.  The patient did not follow-up as she wanted to "ignore this".  She recently had a CAT scan which showed a interval increase in the maximum diameter of her aneurysm, now measuring 6 cm.  She is asymptomatic.  She denies abdominal pain or back pain.  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  She has not had any significant weight loss.  The patient suffers from hypercholesterolemia which is managed with a statin.  She is medically managed for hypertension.  She is a former smoker.  The patient does endorse claudication symptoms which are lifestyle limiting to her.  Her right leg bothers her more than the left.  She denies any cardiac history.  She does not have a cardiologist.  Past Medical History  Diagnosis Date  . Arthritis   . Depression   . Hyperlipidemia   . Hypertension     Past Surgical History  Procedure Laterality Date  . Shoulder arthroscopy w/ rotator cuff repair Right     Social History   Social History  . Marital Status: Divorced    Spouse Name: N/A  . Number of Children: N/A  . Years of Education: N/A   Occupational History  . Not on file.   Social History Main Topics  . Smoking status: Former Smoker    Quit date:  02/15/1996  . Smokeless tobacco: Never Used  . Alcohol Use: No  . Drug Use: No  . Sexual Activity: Not on file   Other Topics Concern  . Not on file   Social History Narrative    Family History  Problem Relation Age of Onset  . Cancer Father     stomach    Allergies as of 02/25/2016 - Review Complete 02/25/2016  Allergen Reaction Noted  . Codeine Nausea And Vomiting 01/17/2014  . Penicillins  03/18/2011    Current Outpatient Prescriptions on File Prior to Visit  Medication Sig Dispense Refill  . benzonatate (TESSALON) 200 MG capsule Take 1 capsule (200 mg total) by mouth 3 (three) times daily as needed for cough. 30 capsule 0  . isosorbide-hydrALAZINE (BIDIL) 20-37.5 MG per tablet Take 1 tablet by mouth 2 (two) times daily. 60 tablet 1  . omeprazole (PRILOSEC) 20 MG capsule Take 2 capsules (40 mg total) by mouth 2 (two) times daily before a meal. 60 capsule 0  . sertraline (ZOLOFT) 100 MG tablet Take 1 tablet (100 mg total) by mouth daily. Take with 50mg  tablet. Total dose (150mg  daily) 90 tablet 2  . simvastatin (ZOCOR) 80 MG tablet Take 1 tablet (80 mg total) by mouth daily. 90 tablet 2  . traZODone (DESYREL) 100 MG tablet Take 1 tablet (100 mg total) by mouth at  bedtime. If needed for sleep. 90 tablet 2  . dextromethorphan 15 MG/5ML syrup Take 10 mLs (30 mg total) by mouth 4 (four) times daily as needed for cough. (Patient not taking: Reported on 02/25/2016) 120 mL 0  . Ipratropium-Albuterol (COMBIVENT RESPIMAT) 20-100 MCG/ACT AERS respimat Inhale 1 puff into the lungs every 8 (eight) hours. (Patient not taking: Reported on 02/25/2016) 1 Inhaler 0  . ondansetron (ZOFRAN ODT) 8 MG disintegrating tablet Take 1 tablet (8 mg total) by mouth every 8 (eight) hours as needed for nausea or vomiting. (Patient not taking: Reported on 02/25/2016) 30 tablet 0   No current facility-administered medications on file prior to visit.     REVIEW OF SYSTEMS: See history of present illness for  pertinent positives and negatives.  Otherwise all systems are negative  PHYSICAL EXAMINATION:   Vital signs are  Filed Vitals:   02/25/16 0927  BP: 114/52  Pulse: 86  Temp: 99.8 F (37.7 C)  TempSrc: Oral  Resp: 18  Height:  (1.549 m)  Weight: 142 lb (64.411 kg)  SpO2: 92%   Body mass index is 26.84 kg/(m^2). General: The patient appears their stated age. HEENT:  No gross abnormalities Pulmonary:  Non labored breathing Abdomen: Soft and non-tender.  Aorta is easily palpated and nontender Musculoskeletal: There are no major deformities. Neurologic: No focal weakness or paresthesias are detected, Skin: There are no ulcer or rashes noted. Psychiatric: The patient has normal affect. Cardiovascular: There is a regular rate and rhythm without significant murmur appreciated.I cannot palpate pedal pulses.  She has palpable femoral pulses, however the left is more pronounced than the right.  No carotid bruits   Diagnostic Studies I have reviewed her CT scan which shows a 6 cm infrarenal abdominal aortic aneurysm.  Her aorta is heavily calcified.  The iliac vessels were not well delineated.  Assessment: Abdominal aortic aneurysm Plan: The patient's aneurysm has grown from 5.8-6.0 cm in a year and a half.  We discussed the risk of rupture.  We also discussed that she may be a candidate for endovascular repair.  She will be scheduled for angiography by Dr. [name] on Monday, April 3 to determine if she is a candidate for endovascular repair.  In addition, she will need bilateral lower extremity runoff to evaluate for the reason for her claudication.  Once this has been done, she will need a discussion regarding endovascular versus open repair versus not treating her aneurysm.  If she elects to have her aneurysm treated, she will need cardiac clearance as well as carotid Dopplers and a baseline lower extremity duplex/ABI.  I will defer definitive treatment to Dr. [name] since he originally  evaluated the patient.  Jorge Ny, M.D. [facility] Office: [phone] Pager:  [phone]

## 2016-03-03 NOTE — Op Note (Signed)
PATIENT: Nicole Dean   MRN: [id number] DOB: 24-Oct-1941    DATE OF PROCEDURE: 03/03/2016  INDICATIONS: Nicole Dean is a 75 y.o. 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 CT scan at that time showed significant iliac artery occlusive disease and also a very short neck and she was felt probably not to be a candidate for EVAR. In addition, she had a circumaortic left renal vein. I recommended that she undergo preoperative cardiac evaluation and also arteriography in order to evaluate her for possible open repair of her aneurysm. The patient at this time decided not to pursue elective repair of her aneurysm. She was followed by her primary care physician within on a follow up study the aneurysm had enlarged to 6 cm and she was sent back to our office where she was evaluated by Dr. Myra Dean.  PROCEDURE:  1. Ultrasound-guided access to the right common femoral artery 2. Ultrasound-guided access to the left common femoral artery 3. Aortogram with bilateral iliac arteriogram 4. Retrograde right femoral arteriogram 5. Retrograde left femoral arteriogram  SURGEON: Nicole Kindle. [name], MD, FACS  ANESTHESIA: Local with sedation   EBL: Minimal  TECHNIQUE: The patient was taken to the peripheral vascular lab. The period of conscious sedation began at (7:37 AM) and ended at (8:46 AM).  During that time period, I was present face-to-face 100% of the time.  The patient was administered (1 mg of Versed and 50 g of fentanyl.). The patient's heart rate, blood pressure, and oxygen saturation were monitored by the nurse continuously during the procedure.  Both groins were prepped and draped in usual sterile fashion. Under ultrasound guidance, after the skin was anesthetized, the right common femoral artery was cannulated with a micropuncture  Needle a micropuncture wire  introduced. This was exchanged for a 5 [place] sheath over a [employer] wire. The wire would not advance through a large calcific plaque in the right proximal common iliac artery. I tried to navigate this area with a Berenstein catheter and angled Glidewire but was not successful. I also attempted a [place] core wire. As I was unable to pass the right common iliac artery stenosis I elected to stick the left side.  Under ultrasound guidance, after the skin was anesthetized, the left common femoral artery was cannulated with a micropuncture needle and a micropuncture sheath introduced over a wire. This was exchanged for a 5 [place] sheath over a [employer] wire. Given that the catheter was likely occlusive the patient received 4000 units of IV heparin. [facility] wire was advanced into the infrarenal aorta and the pigtail catheter was positioned at the L1 vertebral body. Flush aortogram was obtained. Catheter was advanced approximately 3 cm in a lateral projection was obtained. Next the catheter was positioned above the aortic bifurcation and oblique iliac projection was obtained. The catheter was then removed over a wire and the wire was removed.  Next a retrograde left femoral arteriogram was obtained with left lower extremity runoff. Then a right retrograde femoral arteriogram was obtained with right lower extremity runoff.   FINDINGS:  1. There are single renal arteries bilaterally with no significant renal artery stenosis identified. There is no significant normal infrarenal aorta below the aneurysm. The celiac axis and superior mesenteric artery appear to be patent although they do have calcific disease. 2. There are bilateral bulky calcific plaques in both common iliac arteries. 3.  On the right side, the external iliac, distal common iliac and hypogastric arteries are patent although with mild diffuse disease. The common femoral, superficial femoral, deep femoral, popliteal, anterior tibial, posterior tibial, and  peroneal arteries are patent. 4. On the left side, the distal common iliac artery, hypogastric artery, external iliac arteries are patent although they have mild diffuse disease. The left common femoral, superficial femoral, deep femoral, popliteal, proximal anterior tibial, posterior tibial and peroneal arteries are patent. The anterior tibial artery is poorly visualized distally.  CLINICAL NOTE: The CT and arteriogram showed that this patient does not appear to be a candidate for an endovascular repair of her aneurysm. She would require open repair. The aneurysm has gone from 5.8 cm  To 6.0 cm in 2 years. Previously she had climbed elective repair. I will arrange for preoperative cardiac clearance and we'll see her in the office to discuss possible open repair of her aneurysm. Certainly her procedure would be Associated with some increased risk given her short neck and circumflex and aortic renal vein.    Nicole Ferrarihristopher Dickson, MD, FACS [facility]  DATE OF DICTATION:   03/03/2016

## 2016-03-03 NOTE — Progress Notes (Signed)
Site area: right groin a 5 french arterial sheath was removed  Site Prior to Removal:  Level 0  Pressure Applied For 15 MINUTES    Minutes Beginning at 0915a  Manual:   Yes.    Patient Status During Pull:  stable  Post Pull Groin Site:  Level 0  Post Pull Instructions Given:  Yes.    Post Pull Pulses Present:  Yes.    Dressing Applied:  Yes.    Comments:  VS remain stable during sheath pull

## 2016-03-03 NOTE — Telephone Encounter (Signed)
Lucky for us, we had a cancellation today at 3 for both ultrasounds. I scheduled CSD's appt for 4/12 at 12:30.   The first available [facility]   I could not get the pt directly, I left a message on their phone and their emergency contact's phone.

## 2016-03-03 NOTE — Progress Notes (Signed)
Site area: Left groin a 5 french arterial sheath was removed  Site Prior to Removal:  Level 0  Pressure Applied For 15 MINUTES    Minutes Beginning at 0935am  Manual:   Yes.    Patient Status During Pull:  stable  Post Pull Groin Site:  Level 0  Post Pull Instructions Given:  Yes.    Post Pull Pulses Present:  Yes.    Dressing Applied:  Yes.    Comments:  VS remain stable during sheath pull

## 2016-03-03 NOTE — Telephone Encounter (Signed)
-----   Message from Sharee PimpleMarilyn K McChesney, RN sent at 03/03/2016 10:05 AM EDT ----- Regarding: RE: schedule Do the carotid duplex first, she had aortogram so ABI is not as important.  I put in the cardiac referral but didn't see anyone listed that she had seen before.   ----- Message -----    From: Jena GaussMichele A Roczniak    Sent: 03/03/2016   9:59 AM      To: Sharee PimpleMarilyn K McChesney, RN Subject: RE: schedule                                   We can do one ultrasound today, but the next available for the second one is not until 5/24.  Which test is more important?  ----- Message -----    From: Sharee PimpleMarilyn K McChesney, RN    Sent: 03/03/2016   9:26 AM      To: [facility] Subject: schedule                                         ----- Message -----    From: Chuck Hinthristopher S Dickson, MD    Sent: 03/03/2016   9:07 AM      To: [facility] Charge Pool Subject: charge and f/u                                 PROCEDURE:  1. Ultrasound-guided access to the right common femoral artery 2. Ultrasound-guided access to the left common femoral artery 3. Aortogram with bilateral iliac arteriogram 4. Retrograde right femoral arteriogram 5. Retrograde left femoral arteriogram  SURGEON: Di Kindlehristopher S. Edilia Boickson, MD, FACS  She needs to be considered for open repair of her abdominal aortic aneurysm. She will need preoperative cardiac clearance. In addition she will need ABIs and a carotid duplex at this hasn't already not been scheduled. I need to see her in the office to discuss all of these results. Thank you. CD

## 2016-03-04 ENCOUNTER — Encounter: Payer: Self-pay | Admitting: Vascular Surgery

## 2016-03-12 ENCOUNTER — Encounter: Payer: Self-pay | Admitting: Vascular Surgery

## 2016-03-12 ENCOUNTER — Ambulatory Visit (INDEPENDENT_AMBULATORY_CARE_PROVIDER_SITE_OTHER): Payer: Medicare HMO | Admitting: Vascular Surgery

## 2016-03-12 VITALS — BP 120/72 | HR 74 | Temp 97.3°F | Resp 18 | Ht 62.0 in | Wt 140.0 lb

## 2016-03-12 DIAGNOSIS — I714 Abdominal aortic aneurysm, without rupture, unspecified: Secondary | ICD-10-CM

## 2016-03-12 NOTE — Progress Notes (Signed)
[facility]ist of [place]  Patient name: Nicole Dean Kalata MRN: [id number] DOB: 1940/12/24 Sex: female  REASON FOR VISIT: To discuss repair of abdominal aortic aneurysm.  HPI: Nicole Dean Masden is a 75 y.o. female, who I had originally seen in 2015 with a 5.8 cm aneurysm. I recommended elective repair however at that time she decided not to proceed with repair. She was being followed by her primary care physician in the aneurysm had enlarged to 6.0 cm. She was seen by Dr. Durene CalWells Brabham and set up for an arteriogram which I performed on 03/03/2016. She is not a candidate for an endovascular approach to her aneurysm repair and therefore comes in to discuss open repair.  Unfortunately, she just lost her husband recently.  He denies any abdominal pain or back pain. 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 Her pain is brought on by ambulation relieved with rest. These symptoms have significantly limited her activity.  She is not a smoker. She does have hypertension and hyperlipidemia. She's had no previous history of myocardial infarction or congestive heart failure.  Past Medical History  Diagnosis Date  . Arthritis   . Depression   . Hyperlipidemia   . Hypertension     Family History  Problem Relation Age of Onset  . Cancer Father     stomach    SOCIAL HISTORY: Social History   Social History  . Marital Status: Divorced    Spouse Name: N/A  . Number of Children: N/A  . Years of Education: N/A   Occupational History  . Not on file.   Social History Main Topics  . Smoking status: Former Smoker    Quit date: 02/15/1996  . Smokeless tobacco: Never Used  . Alcohol Use: No  . Drug Use: No  . Sexual Activity: Not on file   Other Topics Concern  . Not on file   Social History Narrative    Allergies  Allergen Reactions  . Codeine Nausea And Vomiting    Severe, ended up in ED  . Penicillins Nausea  Only    Has patient had a PCN reaction causing immediate rash, facial/tongue/throat swelling, SOB or lightheadedness with hypotension: Yes Has patient had a PCN reaction causing severe rash involving mucus membranes or skin necrosis: No Has patient had a PCN reaction that required hospitalization No Has patient had a PCN reaction occurring within the last 10 years: No If all of the above answers are "NO", then may proceed with Cephalosporin use.     Current Outpatient Prescriptions  Medication Sig Dispense Refill  . busPIRone (BUSPAR) 15 MG tablet Take 15 mg by mouth 2 (two) times daily. 15 mg every morning and 30 mg every evening    . busPIRone (BUSPAR) 30 MG tablet Take 30 mg by mouth 2 (two) times daily.    . enalapril (VASOTEC) 20 MG tablet Take 20 mg by mouth daily.    . hydrochlorothiazide (HYDRODIURIL) 25 MG tablet Take 25 mg by mouth daily.    . Multiple Vitamins-Iron (MULTIVITAMIN/IRON PO) Take 1 tablet by mouth daily.    . Omega-3 Fatty Acids (FISH OIL) 1200 MG CAPS Take 1,200 mg by mouth 2 (two) times daily.    Marland Kitchen. omeprazole (PRILOSEC) 40 MG capsule Take 40 mg by mouth daily.    . sertraline (ZOLOFT) 100 MG tablet Take 100 mg by mouth daily.    . simvastatin (ZOCOR) 80 MG tablet Take 1 tablet (80  mg total) by mouth daily. 90 tablet 2  . traMADol (ULTRAM) 50 MG tablet Take 50 mg by mouth every 12 (twelve) hours as needed for moderate pain.     . traZODone (DESYREL) 100 MG tablet Take 100 mg by mouth at bedtime.     No current facility-administered medications for this visit.    REVIEW OF SYSTEMS:   denotes positive finding,  denotes negative finding Cardiac  Comments:  Chest pain or chest pressure:    Shortness of breath upon exertion:    Short of breath when lying flat:    Irregular heart rhythm:        Vascular    Pain in calf, thigh, or hip brought on by ambulation: X bilateral  Pain in feet at night that wakes you up from your sleep:     Blood clot in your  veins:    Leg swelling:         Pulmonary    Oxygen at home:    Productive cough:     Wheezing:         Neurologic    Sudden weakness in arms or legs:     Sudden numbness in arms or legs:     Sudden onset of difficulty speaking or slurred speech:    Temporary loss of vision in one eye:     Problems with dizziness:         Gastrointestinal    Blood in stool:     Vomited blood:         Genitourinary    Burning when urinating:     Blood in urine:        Psychiatric    Major depression:         Hematologic    Bleeding problems:    Problems with blood clotting too easily:        Skin    Rashes or ulcers:        Constitutional    Fever or chills:      PHYSICAL EXAM: Filed Vitals:   03/12/16 1225  BP: 120/72  Pulse: 74  Temp: 97.3 F (36.3 C)  TempSrc: Oral  Resp: 18  Height:  (1.575 m)  Weight: 140 lb (63.504 kg)  SpO2: 95%    GENERAL: The patient is a well-nourished female, in no acute distress. The vital signs are documented above. CARDIAC: There is a regular rate and rhythm.  VASCULAR: I do not appreciate carotid bruits. Cannot palpate femoral pulses. I cannot palpate pedal pulses. PULMONARY: There is good air exchange bilaterally without wheezing or rales. ABDOMEN: Soft and non-tender with normal pitched bowel sounds. Upper aneurysm is nontender. MUSCULOSKELETAL: There are no major deformities or cyanosis. NEUROLOGIC: No focal weakness or paresthesias are detected. SKIN: There are no ulcers or rashes noted. PSYCHIATRIC: The patient has a normal affect.  DATA:   AORTOGRAM: I have reviewed her aortogram. 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 The aneurysm extends up near the renal arteries. For both of these reasons, she is not a candidate for an endovascular repair. She has no significant infrainguinal arterial occlusive disease.  CT ABDOMEN PELVIS: Her aneurysm measures 6 cm in maximum diameter. She has a circum-aortic  left renal vein. She has no significant neck below her renal arteries. She has severe calcific disease and bilateral common iliac arteries.  ARTERIAL DOPPLER STUDY: have independently interpreted her arterial Doppler study today. She has biphasic Doppler signals in both feet  however ABI on the right is 60%. ABI on the left is 72%.  CAROTID DUPLEX: I have reviewed her carotid duplex scan which shows a less than 39% carotid stenosis bilaterally.  MEDICAL ISSUES:  6 CM INFRARENAL ABDOMINAL AORTIC ANEURYSM: even the size of her aneurysm the risk of rupture is approximately 10-15% per year. This reason I have recommended elective repair. She is not a candidate for endovascular repair given her severe common iliac artery disease bilaterally and no significant infrarenal neck. She will require open repair. Open repair is complicated by the short neck and also her circumaortic left renal vein. She is scheduled for preoperative cardiac evaluation by Dr. Elease Hashimoto. Once she is cleared from a cardiac standpoint she is ready to proceed with open repair of her aneurysm. We have discussed the indications for aneurysm repair. I have explained that the risk of rupture without repair is approximately 5-10% per year. We have discussed the advantages and disadvantages of open versus endovascular repair.  I have discussed the potential complications of surgery, including but not limited to bleeding, renal failure, MI, wound healing problems, hernia, graft infection, embolization, or other unpredictable medical problems. I have explained that the risk of mortality or major morbidity is approximately 5%. All of the patients questions were answered and they are agreeable to proceed with surgery.   Waverly Ferrari [facility] [place] Beeper: [phone]

## 2016-03-25 ENCOUNTER — Ambulatory Visit (INDEPENDENT_AMBULATORY_CARE_PROVIDER_SITE_OTHER): Payer: Medicare HMO | Admitting: Cardiovascular Disease

## 2016-03-25 ENCOUNTER — Encounter: Payer: Self-pay | Admitting: Cardiovascular Disease

## 2016-03-25 VITALS — BP 134/58 | HR 88 | Ht 62.0 in | Wt 138.8 lb

## 2016-03-25 DIAGNOSIS — Z01818 Encounter for other preprocedural examination: Secondary | ICD-10-CM

## 2016-03-25 DIAGNOSIS — I714 Abdominal aortic aneurysm, without rupture, unspecified: Secondary | ICD-10-CM | POA: Insufficient documentation

## 2016-03-25 MED ORDER — ATORVASTATIN CALCIUM 40 MG PO TABS: 40.0000 mg | ORAL_TABLET | Freq: Every day | ORAL | Status: DC

## 2016-03-25 MED ORDER — CARVEDILOL 6.25 MG PO TABS: 6.2500 mg | ORAL_TABLET | Freq: Two times a day (BID) | ORAL | Status: DC

## 2016-03-25 MED ORDER — ENALAPRIL MALEATE 10 MG PO TABS: 10.0000 mg | ORAL_TABLET | Freq: Every day | ORAL | Status: DC

## 2016-03-25 NOTE — Patient Instructions (Addendum)
Medication Instructions:  STOP Simvastatin START Atorvastatin 40 mg once daily DECREASE Enalapril to 10 mg once daily START Carvedilol (Coreg) 6.25 mg twice daily  Labwork: None Ordered   Testing/Procedures: Your physician has requested that you have a lexiscan myoview. For further information please visit [email or web address] Please follow instruction sheet, as given.   Follow-Up: Your physician recommends that you schedule a follow-up appointment in: 2 months with Dr. Elease HashimotoNahser   If you need a refill on your cardiac medications before your next appointment, please call your pharmacy.   Thank you for choosing [facility]! Eligha BridegroomMichelle Karmela Bram, RN [phone]

## 2016-03-25 NOTE — Progress Notes (Signed)
Cardiology Office Note   Date:  03/25/2016   ID:  [name], DOB 24-Apr-1941, MRN [id number]  PCP:  [facility]  Cardiologist:   Mayre Bury, Deloris PingPhilip J, MD   Chief Complaint  Patient presents with  . Pre-op Exam    AAA repair    Problem List 1. AAA  2. Essential HTN 3. hyperlipidemia  History of Present Illness: Talitha GivensJanice D Beadle is a 75 y.o. female who presents for pre-op eval prior to AAA repair Was seen with daughter , [name] .  She has known about this AAA for a while,   Was offered repair but she declined - too much going on Recently found that the AAA had enlarged Does not have any abdominal pain  - has bilateral groin pain with walking   Has leg pain and weakness with climbing stairs.  No real CP or dyspnea   Former smoker - quit 17 year ago .     Past Medical History  Diagnosis Date  . Arthritis   . Depression   . Hyperlipidemia   . Hypertension     Past Surgical History  Procedure Laterality Date  . Shoulder arthroscopy w/ rotator cuff repair Right   . Peripheral vascular catheterization N/A 03/03/2016    Procedure: Abdominal Aortogram w/Lower Extremity;  Surgeon: Chuck Hinthristopher S Dickson, MD;  [facility];  Service: Cardiovascular;  Laterality: N/A;     Current Outpatient Prescriptions  Medication Sig Dispense Refill  . busPIRone (BUSPAR) 15 MG tablet Take 15 mg by mouth 2 (two) times daily. 15 mg every morning and 30 mg every evening    . enalapril (VASOTEC) 20 MG tablet Take 20 mg by mouth daily.    . hydrochlorothiazide (HYDRODIURIL) 25 MG tablet Take 25 mg by mouth daily.    . Multiple Vitamins-Iron (MULTIVITAMIN/IRON PO) Take 1 tablet by mouth daily.    . Omega-3 Fatty Acids (FISH OIL) 1200 MG CAPS Take 1,200 mg by mouth 2 (two) times daily.    Marland Kitchen. omeprazole (PRILOSEC) 40 MG capsule Take 40 mg by mouth daily.    . sertraline (ZOLOFT) 100 MG tablet Take 100 mg by mouth daily.    . simvastatin (ZOCOR) 80  MG tablet Take 1 tablet (80 mg total) by mouth daily. 90 tablet 2  . traMADol (ULTRAM) 50 MG tablet Take 50 mg by mouth every 12 (twelve) hours as needed for moderate pain.     . traZODone (DESYREL) 100 MG tablet Take 100 mg by mouth at bedtime.    . busPIRone (BUSPAR) 30 MG tablet Take 30 mg by mouth 2 (two) times daily. Reported on 03/25/2016     No current facility-administered medications for this visit.    Allergies:   Codeine and Penicillins    Social History:  The patient  reports that she quit smoking about 20 years ago. She has never used smokeless tobacco. She reports that she does not drink alcohol or use illicit drugs.   Family History:  The patient's family history includes Cancer in her father.    ROS:  Please see the history of present illness.    Review of Systems: Constitutional:  denies fever, chills, diaphoresis, appetite change and fatigue.  HEENT: denies photophobia, eye pain, redness, hearing loss, ear pain, congestion, sore throat, rhinorrhea, sneezing, neck pain, neck stiffness and tinnitus.  Respiratory: denies SOB, DOE, cough, chest tightness, and wheezing.  Cardiovascular: denies chest pain, palpitations and leg swelling.  Gastrointestinal: denies nausea,  vomiting, abdominal pain, diarrhea, constipation, blood in stool.  Genitourinary: denies dysuria, urgency, frequency, hematuria, flank pain and difficulty urinating.  Musculoskeletal: denies  myalgias, back pain, joint swelling, arthralgias and gait problem.   Skin: denies pallor, rash and wound.  Neurological: denies dizziness, seizures, syncope, weakness, light-headedness, numbness and headaches.   Hematological: denies adenopathy, easy bruising, personal or family bleeding history.  Psychiatric/ Behavioral: denies suicidal ideation, mood changes, confusion, nervousness, sleep disturbance and agitation.       All other systems are reviewed and negative.    PHYSICAL EXAM: VS:  BP 134/58 mmHg  Pulse  88  Ht 5\' 2"  (1.575 m)  Wt 138 lb 12.8 oz (62.959 kg)  BMI 25.38 kg/m2 , BMI Body mass index is 25.38 kg/(m^2). GEN: Well nourished, well developed, in no acute distress HEENT: normal Neck: no JVD, carotid bruits, or masses Cardiac: RRR; no murmurs, rubs, or gallops,no edema  Respiratory:  clear to auscultation bilaterally, normal work of breathing GI: soft, + abdomina bruit,  pulsitile mass  MS: no deformity or atrophy Skin: warm and dry, no rash Neuro:  Strength and sensation are intact Psych: normal   EKG:  EKG is not ordered today. The ekg ordered April 3  demonstrates NSR at 75 , no ST or T wave changes    Recent Labs: 03/03/2016: BUN 15; Creatinine, Ser 0.70; Hemoglobin 12.6; Potassium 3.7; Sodium 141    Lipid Panel    Component Value Date/Time   CHOL 199 09/01/2012 1429   TRIG 302* 09/01/2012 1429   HDL 42 09/01/2012 1429   CHOLHDL 4.7 09/01/2012 1429   VLDL 60* 09/01/2012 1429   LDLCALC 97 09/01/2012 1429   LDLDIRECT 82 03/23/2009 2152      Wt Readings from Last 3 Encounters:  03/25/16 138 lb 12.8 oz (62.959 kg)  03/12/16 140 lb (63.504 kg)  03/03/16 145 lb (65.772 kg)      Other studies Reviewed: Additional studies/ records that were reviewed today include: . Review of the above records demonstrates:    ASSESSMENT AND PLAN:  1.  AAA - Patient is seen for preoperative visit. She denies any chest pain. She is chronically short of breath. She has significant leg pain climbing stairs so she really does not do any sort of exercise.  We'll schedule her for a [employer] Myoview study.  I'll see her back in 2 months. Sooner if needed   Current medicines are reviewed at length with the patient today.  The patient does not have concerns regarding medicines.  The following changes have been made:  no change  Labs/ tests ordered today include:  No orders of the defined types were placed in this encounter.     Disposition:   FU with me in 2 months       Ashton Belote, Deloris PingPhilip J, MD  03/25/2016 10:10 AM    [facility] [address], [place], [place]  [address] Phone: [phone](336) [phone]; Fax: [phone]   [facility]  [address] [place], [place]  [address] [phone]   Fax [phone]

## 2016-03-26 ENCOUNTER — Encounter (HOSPITAL_COMMUNITY)
Admission: RE | Admit: 2016-03-26 | Discharge: 2016-03-26 | Disposition: A | Discharge status: Home / Self Care | Payer: Medicare HMO | Source: Ambulatory Visit | Attending: Cardiovascular Disease | Admitting: Cardiovascular Disease

## 2016-03-26 DIAGNOSIS — I714 Abdominal aortic aneurysm, without rupture, unspecified: Secondary | ICD-10-CM

## 2016-03-26 DIAGNOSIS — Z01818 Encounter for other preprocedural examination: Secondary | ICD-10-CM | POA: Diagnosis not present

## 2016-03-26 LAB — NM MYOCAR MULTI W/SPECT W/WALL MOTION / EF
CHL CUP NUCLEAR SDS: 10
CHL CUP NUCLEAR SRS: 4
CHL CUP RESTING HR STRESS: 64 {beats}/min
CHL CUP STRESS STAGE 1 SBP: 131 mmHg
CHL CUP STRESS STAGE 1 SPEED: 0 mph
CHL CUP STRESS STAGE 3 DBP: 61 mmHg
CHL CUP STRESS STAGE 3 GRADE: 0 %
CHL CUP STRESS STAGE 3 HR: 85 {beats}/min
CHL CUP STRESS STAGE 4 HR: 82 {beats}/min
CSEPED: 0 min
CSEPEDS: 0 s
CSEPPBP: 132 mmHg
CSEPPMHR: 55 %
Estimated workload: 1 METS
LV dias vol: 65 mL (ref 46–106)
LVSYSVOL: 24 mL
MPHR: 147 {beats}/min
NUC STRESS TID: 1.36
Peak HR: 82 {beats}/min
Percent HR: 59 %
RATE: 0
SSS: 14
Stage 1 DBP: 65 mmHg
Stage 1 Grade: 0 %
Stage 1 HR: 66 {beats}/min
Stage 2 Grade: 0 %
Stage 2 HR: 65 {beats}/min
Stage 2 Speed: 0 mph
Stage 3 SBP: 131 mmHg
Stage 3 Speed: 0 mph
Stage 4 DBP: 63 mmHg
Stage 4 Grade: 0 %
Stage 4 SBP: 132 mmHg
Stage 4 Speed: 0 mph

## 2016-03-26 MED ORDER — TECHNETIUM TC 99M SESTAMIBI GENERIC - CARDIOLITE
10.0000 | Freq: Once | INTRAVENOUS | Status: AC | PRN
  Administered 2016-03-26: 10 via INTRAVENOUS

## 2016-03-26 MED ORDER — REGADENOSON 0.4 MG/5ML IV SOLN
INTRAVENOUS | Status: AC
  Administered 2016-03-26: 0.4 mg
  Filled 2016-03-26: qty 5

## 2016-03-26 MED ORDER — TECHNETIUM TC 99M SESTAMIBI GENERIC - CARDIOLITE
30.0000 | Freq: Once | INTRAVENOUS | Status: AC | PRN
  Administered 2016-03-26: 30 via INTRAVENOUS

## 2016-03-27 ENCOUNTER — Encounter: Payer: Self-pay | Admitting: Nurse Practitioner

## 2016-03-27 ENCOUNTER — Telehealth: Payer: Self-pay | Admitting: Nurse Practitioner

## 2016-03-27 DIAGNOSIS — R9439 Abnormal result of other cardiovascular function study: Secondary | ICD-10-CM

## 2016-03-27 MED ORDER — ASPIRIN EC 81 MG PO TBEC: 81.0000 mg | DELAYED_RELEASE_TABLET | Freq: Every day | ORAL | Status: DC

## 2016-03-27 NOTE — Telephone Encounter (Signed)
Notes Recorded by Vesta MixerPhilip J Nahser, MD on 03/27/2016 at 5:24 PM myoivew is suggestive of inferior ischemia. I attempted to call her daughter ( the number listed as this patient's contact number )  Left a message for her to call back tomorrow. We have set her up for a cath on Monday am. She will need to get lab work tomorrow  Attempted to call patient @ [phone] and daughter's number on DPR, [phone] Cardiac cath is scheduled for 9:00 am with Dr. SwazilandJordan.  I am routing message to triage because Dr. Elease HashimotoNahser nor I will be in the office tomorrow.  Letter has been completed and is in patient's chart.  She needs to start Aspirin 81 mg and take daily.  She needs to have pre-procedure lab work on Friday April 28.

## 2016-03-28 ENCOUNTER — Other Ambulatory Visit (INDEPENDENT_AMBULATORY_CARE_PROVIDER_SITE_OTHER): Payer: Medicare HMO | Admitting: *Deleted

## 2016-03-28 DIAGNOSIS — R9439 Abnormal result of other cardiovascular function study: Secondary | ICD-10-CM

## 2016-03-28 LAB — CBC WITH DIFFERENTIAL/PLATELET
BASOS ABS: 0 {cells}/uL (ref 0–200)
Basophils Relative: 0 %
EOS ABS: 70 {cells}/uL (ref 15–500)
Eosinophils Relative: 1 %
HEMATOCRIT: 36.4 % (ref 35.0–45.0)
HEMOGLOBIN: 12 g/dL (ref 11.7–15.5)
LYMPHS ABS: 1820 {cells}/uL (ref 850–3900)
LYMPHS PCT: 26 %
MCH: 29.5 pg (ref 27.0–33.0)
MCHC: 33 g/dL (ref 32.0–36.0)
MCV: 89.4 fL (ref 80.0–100.0)
MONO ABS: 420 {cells}/uL (ref 200–950)
MPV: 9.6 fL (ref 7.5–12.5)
Monocytes Relative: 6 %
NEUTROS PCT: 67 %
Neutro Abs: 4690 cells/uL (ref 1500–7800)
Platelets: 239 10*3/uL (ref 140–400)
RBC: 4.07 MIL/uL (ref 3.80–5.10)
RDW: 14.9 % (ref 11.0–15.0)
WBC: 7 10*3/uL (ref 3.8–10.8)

## 2016-03-28 LAB — BASIC METABOLIC PANEL
BUN: 16 mg/dL (ref 7–25)
CHLORIDE: 105 mmol/L (ref 98–110)
CO2: 21 mmol/L (ref 20–31)
Calcium: 9 mg/dL (ref 8.6–10.4)
Creat: 0.65 mg/dL (ref 0.60–0.93)
GLUCOSE: 129 mg/dL — AB (ref 65–99)
POTASSIUM: 4 mmol/L (ref 3.5–5.3)
SODIUM: 139 mmol/L (ref 135–146)

## 2016-03-28 LAB — PROTIME-INR
INR: 1.05 (ref <1.50)
Prothrombin Time: 13.8 seconds (ref 11.6–15.2)

## 2016-03-28 NOTE — Telephone Encounter (Signed)
Follow up      Returning a call to the nurse to get nuclear stress test results

## 2016-03-28 NOTE — Telephone Encounter (Signed)
I spoke with the patient and her daughter and advised them of the patient's myoview results and Dr. Harvie BridgeNahser's recommendations for the patient to have a heart cath on Monday. The patient is agreeable. She will come in this morning for labs and I will go over her instructions with her. She is is pending surgery for AAA.

## 2016-03-31 ENCOUNTER — Encounter (HOSPITAL_COMMUNITY)
Admission: RE | Disposition: A | Discharge status: Home / Self Care | Payer: Self-pay | Source: Ambulatory Visit | Attending: Cardiology

## 2016-03-31 ENCOUNTER — Ambulatory Visit (HOSPITAL_COMMUNITY)
Admission: RE | Admit: 2016-03-31 | Discharge: 2016-03-31 | Disposition: A | Discharge status: Home / Self Care | Payer: Medicare HMO | Source: Ambulatory Visit | Attending: Cardiology | Admitting: Cardiology

## 2016-03-31 ENCOUNTER — Encounter (HOSPITAL_COMMUNITY): Payer: Self-pay | Admitting: Cardiology

## 2016-03-31 DIAGNOSIS — Z87891 Personal history of nicotine dependence: Secondary | ICD-10-CM | POA: Insufficient documentation

## 2016-03-31 DIAGNOSIS — E782 Mixed hyperlipidemia: Secondary | ICD-10-CM | POA: Diagnosis present

## 2016-03-31 DIAGNOSIS — M1990 Unspecified osteoarthritis, unspecified site: Secondary | ICD-10-CM | POA: Diagnosis not present

## 2016-03-31 DIAGNOSIS — I2584 Coronary atherosclerosis due to calcified coronary lesion: Secondary | ICD-10-CM | POA: Insufficient documentation

## 2016-03-31 DIAGNOSIS — I714 Abdominal aortic aneurysm, without rupture, unspecified: Secondary | ICD-10-CM | POA: Diagnosis present

## 2016-03-31 DIAGNOSIS — I2510 Atherosclerotic heart disease of native coronary artery without angina pectoris: Secondary | ICD-10-CM | POA: Diagnosis not present

## 2016-03-31 DIAGNOSIS — Z8679 Personal history of other diseases of the circulatory system: Secondary | ICD-10-CM | POA: Insufficient documentation

## 2016-03-31 DIAGNOSIS — E785 Hyperlipidemia, unspecified: Secondary | ICD-10-CM | POA: Diagnosis not present

## 2016-03-31 DIAGNOSIS — I10 Essential (primary) hypertension: Secondary | ICD-10-CM | POA: Insufficient documentation

## 2016-03-31 DIAGNOSIS — F329 Major depressive disorder, single episode, unspecified: Secondary | ICD-10-CM | POA: Insufficient documentation

## 2016-03-31 DIAGNOSIS — R9439 Abnormal result of other cardiovascular function study: Secondary | ICD-10-CM | POA: Diagnosis present

## 2016-03-31 DIAGNOSIS — Z880 Allergy status to penicillin: Secondary | ICD-10-CM | POA: Insufficient documentation

## 2016-03-31 DIAGNOSIS — I2582 Chronic total occlusion of coronary artery: Secondary | ICD-10-CM | POA: Diagnosis not present

## 2016-03-31 HISTORY — PX: CARDIAC CATHETERIZATION: SHX172

## 2016-03-31 SURGERY — LEFT HEART CATH AND CORONARY ANGIOGRAPHY
Anesthesia: LOCAL

## 2016-03-31 MED ORDER — FENTANYL CITRATE (PF) 100 MCG/2ML IJ SOLN
INTRAMUSCULAR | Status: AC
  Filled 2016-03-31: qty 2

## 2016-03-31 MED ORDER — LIDOCAINE HCL (PF) 1 % IJ SOLN
INTRAMUSCULAR | Status: DC | PRN
Start: 2016-03-31 — End: 2016-03-31
  Administered 2016-03-31: 2 mL

## 2016-03-31 MED ORDER — SODIUM CHLORIDE 0.9% FLUSH: 3.0000 mL | INTRAVENOUS | Status: DC | PRN

## 2016-03-31 MED ORDER — HEPARIN SODIUM (PORCINE) 1000 UNIT/ML IJ SOLN
INTRAMUSCULAR | Status: AC
  Filled 2016-03-31: qty 1

## 2016-03-31 MED ORDER — ASPIRIN 81 MG PO CHEW: 81.0000 mg | CHEWABLE_TABLET | ORAL | Status: DC

## 2016-03-31 MED ORDER — VERAPAMIL HCL 2.5 MG/ML IV SOLN
INTRAVENOUS | Status: AC
  Filled 2016-03-31: qty 2

## 2016-03-31 MED ORDER — SODIUM CHLORIDE 0.9 % WEIGHT BASED INFUSION: 1.0000 mL/kg/h | INTRAVENOUS | Status: DC

## 2016-03-31 MED ORDER — MIDAZOLAM HCL 2 MG/2ML IJ SOLN
INTRAMUSCULAR | Status: DC | PRN
  Administered 2016-03-31: 1 mg via INTRAVENOUS

## 2016-03-31 MED ORDER — SODIUM CHLORIDE 0.9% FLUSH: 3.0000 mL | Freq: Two times a day (BID) | INTRAVENOUS | Status: DC

## 2016-03-31 MED ORDER — SODIUM CHLORIDE 0.9 % IV SOLN: 250.0000 mL | INTRAVENOUS | Status: DC | PRN

## 2016-03-31 MED ORDER — HEPARIN SODIUM (PORCINE) 1000 UNIT/ML IJ SOLN
INTRAMUSCULAR | Status: DC | PRN
Start: 2016-03-31 — End: 2016-03-31
  Administered 2016-03-31: 4000 [IU] via INTRAVENOUS

## 2016-03-31 MED ORDER — MIDAZOLAM HCL 2 MG/2ML IJ SOLN
INTRAMUSCULAR | Status: AC
  Filled 2016-03-31: qty 2

## 2016-03-31 MED ORDER — IOPAMIDOL (ISOVUE-370) INJECTION 76%
INTRAVENOUS | Status: AC
  Filled 2016-03-31: qty 100

## 2016-03-31 MED ORDER — VERAPAMIL HCL 2.5 MG/ML IV SOLN
INTRAVENOUS | Status: DC | PRN
  Administered 2016-03-31: 10 mL via INTRA_ARTERIAL

## 2016-03-31 MED ORDER — SODIUM CHLORIDE 0.9 % WEIGHT BASED INFUSION
3.0000 mL/kg/h | INTRAVENOUS | Status: AC
  Administered 2016-03-31: 3 mL/kg/h via INTRAVENOUS

## 2016-03-31 MED ORDER — IOPAMIDOL (ISOVUE-370) INJECTION 76%
INTRAVENOUS | Status: DC | PRN
  Administered 2016-03-31: 75 mL via INTRA_ARTERIAL

## 2016-03-31 MED ORDER — SODIUM CHLORIDE 0.9 % WEIGHT BASED INFUSION: 3.0000 mL/kg/h | INTRAVENOUS | Status: DC

## 2016-03-31 MED ORDER — LIDOCAINE HCL (PF) 1 % IJ SOLN
INTRAMUSCULAR | Status: AC
  Filled 2016-03-31: qty 30

## 2016-03-31 MED ORDER — HEPARIN (PORCINE) IN NACL 2-0.9 UNIT/ML-% IJ SOLN
INTRAMUSCULAR | Status: DC | PRN
  Administered 2016-03-31: 1000 mL via INTRA_ARTERIAL

## 2016-03-31 MED ORDER — FENTANYL CITRATE (PF) 100 MCG/2ML IJ SOLN
INTRAMUSCULAR | Status: DC | PRN
  Administered 2016-03-31: 25 ug via INTRAVENOUS

## 2016-03-31 MED ORDER — HEPARIN (PORCINE) IN NACL 2-0.9 UNIT/ML-% IJ SOLN
INTRAMUSCULAR | Status: AC
  Filled 2016-03-31: qty 1000

## 2016-03-31 SURGICAL SUPPLY — 11 items

## 2016-03-31 NOTE — Interval H&P Note (Signed)
History and Physical Interval Note:  03/31/2016 8:25 AM  Nicole Dean  has presented today for surgery, with the diagnosis of abnormal myoview  The various methods of treatment have been discussed with the patient and family. After consideration of risks, benefits and other options for treatment, the patient has consented to  Procedure(s): Left Heart Cath and Coronary Angiography (N/A) as a surgical intervention .  The patient's history has been reviewed, patient examined, no change in status, stable for surgery.  I have reviewed the patient's chart and labs.  Questions were answered to the patient's satisfaction.    Cath Lab Visit (complete for each Cath Lab visit)  Clinical Evaluation Leading to the Procedure:   ACS: No.  Non-ACS:    Anginal Classification: CCS I  Anti-ischemic medical therapy: Minimal Therapy (1 class of medications)  Non-Invasive Test Results: Intermediate-risk stress test findings: cardiac mortality 1-3%/year  Prior CABG: No previous CABG       Theron Aristaeter Providence HospitalJordanMD,FACC 03/31/2016 8:26 AM

## 2016-03-31 NOTE — H&P (View-Only) (Signed)
Cardiology Office Note   Date:  03/25/2016   ID:  Nicole MajesticJanice D Elzey, DOB 24-Apr-1941, MRN [id number]  PCP:  [facility]  Cardiologist:   Nahser, Deloris PingPhilip J, MD   Chief Complaint  Patient presents with  . Pre-op Exam    AAA repair    Problem List 1. AAA  2. Essential HTN 3. hyperlipidemia  History of Present Illness: Nicole GivensJanice D Dean is a 75 y.o. female who presents for pre-op eval prior to AAA repair Was seen with daughter , [name] .  She has known about this AAA for a while,   Was offered repair but she declined - too much going on Recently found that the AAA had enlarged Does not have any abdominal pain  - has bilateral groin pain with walking   Has leg pain and weakness with climbing stairs.  No real CP or dyspnea   Former smoker - quit 17 year ago .     Past Medical History  Diagnosis Date  . Arthritis   . Depression   . Hyperlipidemia   . Hypertension     Past Surgical History  Procedure Laterality Date  . Shoulder arthroscopy w/ rotator cuff repair Right   . Peripheral vascular catheterization N/A 03/03/2016    Procedure: Abdominal Aortogram w/Lower Extremity;  Surgeon: Chuck Hinthristopher S Dickson, MD;  [facility];  Service: Cardiovascular;  Laterality: N/A;     Current Outpatient Prescriptions  Medication Sig Dispense Refill  . busPIRone (BUSPAR) 15 MG tablet Take 15 mg by mouth 2 (two) times daily. 15 mg every morning and 30 mg every evening    . enalapril (VASOTEC) 20 MG tablet Take 20 mg by mouth daily.    . hydrochlorothiazide (HYDRODIURIL) 25 MG tablet Take 25 mg by mouth daily.    . Multiple Vitamins-Iron (MULTIVITAMIN/IRON PO) Take 1 tablet by mouth daily.    . Omega-3 Fatty Acids (FISH OIL) 1200 MG CAPS Take 1,200 mg by mouth 2 (two) times daily.    Marland Kitchen. omeprazole (PRILOSEC) 40 MG capsule Take 40 mg by mouth daily.    . sertraline (ZOLOFT) 100 MG tablet Take 100 mg by mouth daily.    . simvastatin (ZOCOR) 80  MG tablet Take 1 tablet (80 mg total) by mouth daily. 90 tablet 2  . traMADol (ULTRAM) 50 MG tablet Take 50 mg by mouth every 12 (twelve) hours as needed for moderate pain.     . traZODone (DESYREL) 100 MG tablet Take 100 mg by mouth at bedtime.    . busPIRone (BUSPAR) 30 MG tablet Take 30 mg by mouth 2 (two) times daily. Reported on 03/25/2016     No current facility-administered medications for this visit.    Allergies:   Codeine and Penicillins    Social History:  The patient  reports that she quit smoking about 20 years ago. She has never used smokeless tobacco. She reports that she does not drink alcohol or use illicit drugs.   Family History:  The patient's family history includes Cancer in her father.    ROS:  Please see the history of present illness.    Review of Systems: Constitutional:  denies fever, chills, diaphoresis, appetite change and fatigue.  HEENT: denies photophobia, eye pain, redness, hearing loss, ear pain, congestion, sore throat, rhinorrhea, sneezing, neck pain, neck stiffness and tinnitus.  Respiratory: denies SOB, DOE, cough, chest tightness, and wheezing.  Cardiovascular: denies chest pain, palpitations and leg swelling.  Gastrointestinal: denies nausea,  vomiting, abdominal pain, diarrhea, constipation, blood in stool.  Genitourinary: denies dysuria, urgency, frequency, hematuria, flank pain and difficulty urinating.  Musculoskeletal: denies  myalgias, back pain, joint swelling, arthralgias and gait problem.   Skin: denies pallor, rash and wound.  Neurological: denies dizziness, seizures, syncope, weakness, light-headedness, numbness and headaches.   Hematological: denies adenopathy, easy bruising, personal or family bleeding history.  Psychiatric/ Behavioral: denies suicidal ideation, mood changes, confusion, nervousness, sleep disturbance and agitation.       All other systems are reviewed and negative.    PHYSICAL EXAM: VS:  BP 134/58 mmHg  Pulse  88  Ht  (1.575 m)  Wt 138 lb 12.8 oz (62.959 kg)  BMI 25.38 kg/m2 , BMI Body mass index is 25.38 kg/(m^2). GEN: Well nourished, well developed, in no acute distress HEENT: normal Neck: no JVD, carotid bruits, or masses Cardiac: RRR; no murmurs, rubs, or gallops,no edema  Respiratory:  clear to auscultation bilaterally, normal work of breathing GI: soft, + abdomina bruit,  pulsitile mass  MS: no deformity or atrophy Skin: warm and dry, no rash Neuro:  Strength and sensation are intact Psych: normal   EKG:  EKG is not ordered today. The ekg ordered April 3  demonstrates NSR at 75 , no ST or T wave changes    Recent Labs: 03/03/2016: BUN 15; Creatinine, Ser 0.70; Hemoglobin 12.6; Potassium 3.7; Sodium 141    Lipid Panel    Component Value Date/Time   CHOL 199 09/01/2012 1429   TRIG 302* 09/01/2012 1429   HDL 42 09/01/2012 1429   CHOLHDL 4.7 09/01/2012 1429   VLDL 60* 09/01/2012 1429   LDLCALC 97 09/01/2012 1429   LDLDIRECT 82 03/23/2009 2152      Wt Readings from Last 3 Encounters:  03/25/16 138 lb 12.8 oz (62.959 kg)  03/12/16 140 lb (63.504 kg)  03/03/16 145 lb (65.772 kg)      Other studies Reviewed: Additional studies/ records that were reviewed today include: . Review of the above records demonstrates:    ASSESSMENT AND PLAN:  1.  AAA - Patient is seen for preoperative visit. She denies any chest pain. She is chronically short of breath. She has significant leg pain climbing stairs so she really does not do any sort of exercise.  We'll schedule her for a [employer] study.  I'll see her back in 2 months. Sooner if needed   Current medicines are reviewed at length with the patient today.  The patient does not have concerns regarding medicines.  The following changes have been made:  no change  Labs/ tests ordered today include:  No orders of the defined types were placed in this encounter.     Disposition:   FU with me in 2 months       Nahser, Deloris Ping, MD  03/25/2016 10:10 AM    [facility] [address] [place], [place], [place]  [address] Phone: [phone]; Fax: [phone]   [facility]  [address] [place], [place]  [address] [phone]   Fax [phone]

## 2016-03-31 NOTE — Discharge Instructions (Signed)
Radial Site Care °Refer to this sheet in the next few weeks. These instructions provide you with information about caring for yourself after your procedure. Your health care provider may also give you more specific instructions. Your treatment has been planned according to current medical practices, but problems sometimes occur. Call your health care provider if you have any problems or questions after your procedure. °WHAT TO EXPECT AFTER THE PROCEDURE °After your procedure, it is typical to have the following: °· Bruising at the radial site that usually fades within 1-2 weeks. °· Blood collecting in the tissue (hematoma) that may be painful to the touch. It should usually decrease in size and tenderness within 1-2 weeks. °HOME CARE INSTRUCTIONS °· Take medicines only as directed by your health care provider. °· You may shower 24-48 hours after the procedure or as directed by your health care provider. Remove the bandage (dressing) and gently wash the site with plain soap and water. Pat the area dry with a clean towel. Do not rub the site, because this may cause bleeding. °· Do not take baths, swim, or use a hot tub until your health care provider approves. °· Check your insertion site every day for redness, swelling, or drainage. °· Do not apply powder or lotion to the site. °· Do not flex or bend the affected arm for 24 hours or as directed by your health care provider. °· Do not push or pull heavy objects with the affected arm for 24 hours or as directed by your health care provider. °· Do not lift over 10 lb (4.5 kg) for 5 days after your procedure or as directed by your health care provider. °· Ask your health care provider when it is okay to: °¨ Return to work or school. °¨ Resume usual physical activities or sports. °¨ Resume sexual activity. °· Do not drive home if you are discharged the same day as the procedure. Have someone else drive you. °· You may drive 24 hours after the procedure unless otherwise  instructed by your health care provider. °· Do not operate machinery or power tools for 24 hours after the procedure. °· If your procedure was done as an outpatient procedure, which means that you went home the same day as your procedure, a responsible adult should be with you for the first 24 hours after you arrive home. °· Keep all follow-up visits as directed by your health care provider. This is important. °SEEK MEDICAL CARE IF: °· You have a fever. °· You have chills. °· You have increased bleeding from the radial site. Hold pressure on the site. °SEEK IMMEDIATE MEDICAL CARE IF: °· You have unusual pain at the radial site. °· You have redness, warmth, or swelling at the radial site. °· You have drainage (other than a small amount of blood on the dressing) from the radial site. °· The radial site is bleeding, and the bleeding does not stop after 30 minutes of holding steady pressure on the site. °· Your arm or hand becomes pale, cool, tingly, or numb. °  °This information is not intended to replace advice given to you by your health care provider. Make sure you discuss any questions you have with your health care provider. °  °Document Released: 12/20/2010 Document Revised: 12/08/2014 Document Reviewed: 06/05/2014 °Elsevier Interactive Patient Education ©2016 Elsevier Inc. ° °

## 2016-04-01 ENCOUNTER — Other Ambulatory Visit: Payer: Self-pay | Admitting: *Deleted

## 2016-04-01 ENCOUNTER — Institutional Professional Consult (permissible substitution) (INDEPENDENT_AMBULATORY_CARE_PROVIDER_SITE_OTHER): Payer: Medicare HMO | Admitting: Surgery

## 2016-04-01 ENCOUNTER — Encounter: Payer: Self-pay | Admitting: Surgery

## 2016-04-01 VITALS — BP 119/62 | HR 71 | Resp 16 | Ht 62.0 in | Wt 138.0 lb

## 2016-04-01 DIAGNOSIS — I350 Nonrheumatic aortic (valve) stenosis: Secondary | ICD-10-CM

## 2016-04-01 DIAGNOSIS — I2510 Atherosclerotic heart disease of native coronary artery without angina pectoris: Secondary | ICD-10-CM

## 2016-04-01 DIAGNOSIS — I714 Abdominal aortic aneurysm, without rupture, unspecified: Secondary | ICD-10-CM

## 2016-04-01 NOTE — Progress Notes (Signed)
Cardiothoracic Surgery Consultation   [facility] Referring Provider is SwazilandJordan, Peter M, MD  Chief Complaint  Patient presents with  . Coronary Artery Disease    eval for CABG...CATH 03/31/16...noted during cardiac clearance for AAA REPAIR    HPI:  The patient is a 75 year old remote smoker with hypertension, hyperlipidemia and AAA who recently had a CT for follow up of her AAA which showed that it had further enlarged to 6.0 cm. She was evaluated for open surgical repair by Dr. Edilia Boickson and a preop cardiac workup by Dr. Elease HashimotoNahser included an intermediate risk nuclear stress test showing mild ischemia in the RCA territory with an EF of 63%. Cardiac cath yesterday showed 70% LM and severe 3-vessel CAD with sequential 90% LAD stenoses, 80% proximal LCX stenosis and proximal RCA occlusion with LAD to distal RCA collaterals. LVEF was 55-65%.   She reports at least a 6 month history of substernal chest pain and shortness of breath with exertion but also at night before she goes to bed. She has had tiredness and fatigue and can't walk much without giving out due to tiredness and leg weakness.  Past Medical History  Diagnosis Date  . Arthritis   . Depression   . Hyperlipidemia   . Hypertension     Past Surgical History  Procedure Laterality Date  . Shoulder arthroscopy w/ rotator cuff repair Right   . Peripheral vascular catheterization N/A 03/03/2016    Procedure: Abdominal Aortogram w/Lower Extremity;  Surgeon: Chuck Hinthristopher S Dickson, MD;  [facility];  Service: Cardiovascular;  Laterality: N/A;  . Cardiac catheterization N/A 03/31/2016    Procedure: Left Heart Cath and Coronary Angiography;  Surgeon: Peter M SwazilandJordan, MD;  [facility];  Service: Cardiovascular;  Laterality: N/A;    Family History  Problem Relation Age of Onset  . Cancer Father     stomach    Social History Social History  Substance Use Topics  .  Smoking status: Former Smoker -- 1.50 packs/day for 35 years    Types: Cigarettes    Quit date: 02/15/1996  . Smokeless tobacco: Never Used  . Alcohol Use: No    Current Outpatient Prescriptions  Medication Sig Dispense Refill  . aspirin EC 81 MG tablet Take 1 tablet (81 mg total) by mouth daily.    Marland Kitchen. atorvastatin (LIPITOR) 40 MG tablet Take 1 tablet (40 mg total) by mouth daily. 30 tablet 11  . busPIRone (BUSPAR) 15 MG tablet Take 15 mg by mouth 2 (two) times daily. 15 mg every morning and 30 mg every evening    . carvedilol (COREG) 6.25 MG tablet Take 1 tablet (6.25 mg total) by mouth 2 (two) times daily. 60 tablet 11  . enalapril (VASOTEC) 10 MG tablet Take 1 tablet (10 mg total) by mouth daily. 30 tablet 11  . hydrochlorothiazide (HYDRODIURIL) 25 MG tablet Take 25 mg by mouth daily.    . Multiple Vitamins-Iron (MULTIVITAMIN/IRON PO) Take 1 tablet by mouth daily.    . Omega-3 Fatty Acids (FISH OIL) 1200 MG CAPS Take 1,200 mg by mouth 2 (two) times daily.    Marland Kitchen. omeprazole (PRILOSEC) 40 MG capsule Take 40 mg by mouth daily.    . sertraline (ZOLOFT) 100 MG tablet Take 100 mg by mouth daily.    . traMADol (ULTRAM) 50 MG tablet Take 50 mg by mouth every 12 (twelve) hours as needed for moderate pain.     .Marland Kitchen  traZODone (DESYREL) 100 MG tablet Take 100 mg by mouth at bedtime.    . busPIRone (BUSPAR) 30 MG tablet Take 30 mg by mouth 2 (two) times daily. Reported on 04/01/2016     No current facility-administered medications for this visit.    Allergies  Allergen Reactions  . Codeine Nausea And Vomiting    Severe, ended up in ED  . Penicillins Nausea Only    Has patient had a PCN reaction causing immediate rash, facial/tongue/throat swelling, SOB or lightheadedness with hypotension: Yes Has patient had a PCN reaction causing severe rash involving mucus membranes or skin necrosis: No Has patient had a PCN reaction that required hospitalization No Has patient had a PCN reaction occurring within  the last 10 years: No If all of the above answers are "NO", then may proceed with Cephalosporin use.     Review of Systems  Constitutional: Positive for activity change and fatigue. Negative for appetite change.  HENT: Negative.        Dentures  Eyes: Negative.   Respiratory: Positive for chest tightness and shortness of breath.   Cardiovascular: Positive for chest pain. Negative for palpitations and leg swelling.  Endocrine: Negative.   Genitourinary: Negative.   Musculoskeletal: Positive for arthralgias.  Skin: Negative.   Allergic/Immunologic: Negative.   Neurological: Negative.   Hematological: Bruises/bleeds easily.  Psychiatric/Behavioral: Negative.     BP 119/62 mmHg  Pulse 71  Resp 16  Ht 5\' 2"  (1.575 m)  Wt 138 lb (62.596 kg)  BMI 25.23 kg/m2  SpO2 95% Physical Exam  Constitutional: She is oriented to person, place, and time.  Elderly woman in no distress  HENT:  Head: Normocephalic and atraumatic.  Mouth/Throat: Oropharynx is clear and moist.  Eyes: EOM are normal. Pupils are equal, round, and reactive to light.  Neck: Normal range of motion. Neck supple. No JVD present. No thyromegaly present.  Cardiovascular: Normal rate, regular rhythm and normal heart sounds.   No murmur heard. Pulmonary/Chest: Effort normal and breath sounds normal. No respiratory distress.  Abdominal: Soft. Bowel sounds are normal. She exhibits no distension and no mass. There is no tenderness.  Musculoskeletal: Normal range of motion. She exhibits no edema.  Lymphadenopathy:    She has no cervical adenopathy.  Neurological: She is alert and oriented to person, place, and time. She has normal strength. No cranial nerve deficit or sensory deficit.  Skin: Skin is warm and dry.  Psychiatric: She has a normal mood and affect.     Diagnostic Tests:   Peter M Swaziland, MD (Primary)      Procedures    Left Heart Cath and Coronary Angiography    Conclusion     Ost LM to LM lesion,  70% stenosed.  Prox LAD to Mid LAD lesion, 90% stenosed.  Mid LAD lesion, 90% stenosed.  Ost Cx lesion, 80% stenosed.  Prox RCA to Mid RCA lesion, 100% stenosed.  The left ventricular systolic function is normal.  1. Severe 3 vessel and left main CAD 2. Normal LV function  Plan: Recommend CABG.      Indications    Abnormal nuclear stress test [R93.1 (ICD-10-CM)]    Technique and Indications    Indication: 75 yo WF with AAA and abnormal Myoview study.  Procedural Details: The right wrist was prepped, draped, and anesthetized with 1% lidocaine. Using the modified Seldinger technique, a 6 French slender sheath was introduced into the right radial artery. 3 mg of verapamil was administered through the sheath, weight-based  unfractionated heparin was administered intravenously. Standard Judkins catheters were used for selective coronary angiography and left ventriculography. Catheter exchanges were performed over an exchange length guidewire. There were no immediate procedural complications. A TR band was used for radial hemostasis at the completion of the procedure. The patient was transferred to the post catheterization recovery area for further monitoring.  Contrast: 75 cc  During this procedure the patient is administered a total of Versed 1 mg and Fentanyl 25 mg to achieve and maintain moderate conscious sedation. The patient's heart rate, blood pressure, and oxygen saturation are monitored continuously during the procedure. The period of conscious sedation is 20 minutes, of which I was present face-to-face 100% of this time. Estimated blood loss <50 mL. There were no immediate complications during the procedure.    Coronary Findings    Dominance: Right   Left Main   . Ost LM to LM lesion, 70% stenosed. Discrete.     Left Anterior Descending   . Prox LAD to Mid LAD lesion, 90% stenosed. Moderately Calcified.   . Mid LAD lesion, 90% stenosed. Discrete.     Left  Circumflex   . Ost Cx lesion, 80% stenosed. Discrete.     Right Coronary Artery   . Prox RCA to Mid RCA lesion, 100% stenosed.   . Right Posterior Descending Artery   RPDA filled by collaterals from 3rd Sept.      Wall Motion                 Left Heart    Left Ventricle The left ventricular size is normal. The left ventricular systolic function is normal. The left ventricular ejection fraction is 55-65% by visual estimate. There are no wall motion abnormalities in the left ventricle.    Coronary Diagrams    Diagnostic Diagram            Implants     No implant documentation for this case.    PACS Images    Show images for Cardiac catheterization     Link to Procedure Log    Procedure Log      Hemo Data       Most Recent Value   AO Systolic Pressure  106 mmHg   AO Diastolic Pressure  46 mmHg   AO Mean  69 mmHg   LV Systolic Pressure  111 mmHg   LV Diastolic Pressure  4 mmHg   LV EDP  12 mmHg   Arterial Occlusion Pressure Extended Systolic Pressure  110 mmHg   Arterial Occlusion Pressure Extended Diastolic Pressure  41 mmHg   Arterial Occlusion Pressure Extended Mean Pressure  67 mmHg   Left Ventricular Apex Extended Systolic Pressure  111 mmHg   Left Ventricular Apex Extended Diastolic Pressure  4 mmHg   Left Ventricular Apex Extended EDP Pressure  12 mmHg     Impression:  I have personally reviewed her cath films and CT scans and discussed the results with her and her daughter who is a Engineer, civil (consulting). She has 70% left main and severe 3-vessel coronary artery disease with progressive anginal symptoms that have occurred at rest in the evening but mostly with mild exertion. She also has a 6.0 cm AAA that requires open surgical repair. I agree that CABG is the best treatment for her followed by AAA repair in 2-3 months after she recovers adequately. I discussed the operative procedure with the patient and her daughter including alternatives,  benefits and risks; including but not limited to bleeding,  blood transfusion, infection, stroke, myocardial infarction, graft failure, heart block requiring a permanent pacemaker, organ dysfunction, and death.  [name] understands and agrees to proceed.    Plan:  CABG on Friday 04/04/2016  Alleen Borne, MD [facility] [phone]

## 2016-04-02 ENCOUNTER — Ambulatory Visit (HOSPITAL_BASED_OUTPATIENT_CLINIC_OR_DEPARTMENT_OTHER)
Admission: RE | Admit: 2016-04-02 | Discharge: 2016-04-02 | Disposition: A | Discharge status: Home / Self Care | Payer: Medicare HMO | Source: Ambulatory Visit | Attending: Surgery | Admitting: Surgery

## 2016-04-02 ENCOUNTER — Ambulatory Visit (HOSPITAL_COMMUNITY)
Admission: RE | Admit: 2016-04-02 | Discharge: 2016-04-02 | Disposition: A | Discharge status: Home / Self Care | Payer: Medicare HMO | Source: Ambulatory Visit | Attending: Surgery | Admitting: Surgery

## 2016-04-02 DIAGNOSIS — I350 Nonrheumatic aortic (valve) stenosis: Secondary | ICD-10-CM | POA: Diagnosis not present

## 2016-04-02 DIAGNOSIS — I6523 Occlusion and stenosis of bilateral carotid arteries: Secondary | ICD-10-CM | POA: Insufficient documentation

## 2016-04-02 DIAGNOSIS — I2510 Atherosclerotic heart disease of native coronary artery without angina pectoris: Secondary | ICD-10-CM

## 2016-04-02 LAB — PULMONARY FUNCTION TEST
DL/VA % pred: 71 %
DL/VA: 3.02 ml/min/mmHg/L
DLCO UNC: 11.48 ml/min/mmHg
DLCO unc % pred: 60 %
FEF 25-75 PRE: 0.72 L/s
FEF 25-75 Post: 1.14 L/sec
FEF2575-%Change-Post: 58 %
FEF2575-%PRED-POST: 76 %
FEF2575-%Pred-Pre: 48 %
FEV1-%CHANGE-POST: 16 %
FEV1-%PRED-POST: 82 %
FEV1-%PRED-PRE: 70 %
FEV1-POST: 1.47 L
FEV1-Pre: 1.25 L
FEV1FVC-%Change-Post: 5 %
FEV1FVC-%Pred-Pre: 83 %
FEV6-%CHANGE-POST: 4 %
FEV6-%PRED-POST: 88 %
FEV6-%PRED-PRE: 84 %
FEV6-POST: 2.01 L
FEV6-PRE: 1.92 L
FEV6FVC-%CHANGE-POST: -2 %
FEV6FVC-%PRED-POST: 98 %
FEV6FVC-%PRED-PRE: 101 %
FVC-%Change-Post: 11 %
FVC-%PRED-POST: 93 %
FVC-%Pred-Pre: 83 %
FVC-Post: 2.22 L
FVC-Pre: 1.99 L
POST FEV6/FVC RATIO: 94 %
PRE FEV6/FVC RATIO: 97 %
Post FEV1/FVC ratio: 66 %
Pre FEV1/FVC ratio: 63 %
RV % PRED: 65 %
RV: 1.36 L
TLC % pred: 84 %
TLC: 3.78 L

## 2016-04-02 MED ORDER — ALBUTEROL SULFATE (2.5 MG/3ML) 0.083% IN NEBU
2.5000 mg | INHALATION_SOLUTION | Freq: Once | RESPIRATORY_TRACT | Status: AC
  Administered 2016-04-02: 2.5 mg via RESPIRATORY_TRACT

## 2016-04-02 NOTE — Progress Notes (Signed)
VASCULAR LAB PRELIMINARY  PRELIMINARY  PRELIMINARY  PRELIMINARY  Pre-op Cardiac Surgery  Carotid Findings: Bilateral:  1-39% ICA stenosis.  Vertebral artery flow is antegrade.      Upper Extremity Right Left  Brachial Pressures 121 Triphasic 121 Triphasic  Radial Waveforms Triphasic Triphasic  Ulnar Waveforms Triphasic Triphasic  Palmar Arch (Allen's Test) Normal Normal   Findings:  Doppler waveforms remained normal bilaterally with both radial and ulnar compressions.    Lower  Extremity Right Left  Dorsalis Pedis 80 Biphasic 83 Monophasic  Posterior Tibial 77 Monophasic 92 Triphasic  Ankle/Brachial Indices 0.66 0.76    Findings:  ABIs indicate a moderate reduction in arterial flow on the right and a mild reduction on the left at rest.   Nicole Dean, RVS 04/02/2016, 12:43 PM

## 2016-04-03 ENCOUNTER — Ambulatory Visit (HOSPITAL_COMMUNITY)
Admission: RE | Admit: 2016-04-03 | Discharge: 2016-04-03 | Disposition: A | Discharge status: Home / Self Care | Payer: Medicare HMO | Source: Ambulatory Visit | Attending: Surgery | Admitting: Surgery

## 2016-04-03 ENCOUNTER — Encounter (HOSPITAL_COMMUNITY): Payer: Self-pay

## 2016-04-03 ENCOUNTER — Encounter (HOSPITAL_COMMUNITY)
Admission: RE | Admit: 2016-04-03 | Discharge: 2016-04-03 | Disposition: A | Discharge status: Home / Self Care | Payer: Medicare HMO | Source: Ambulatory Visit | Attending: Surgery | Admitting: Surgery

## 2016-04-03 ENCOUNTER — Encounter (HOSPITAL_COMMUNITY): Payer: Self-pay | Admitting: Anesthesiology

## 2016-04-03 VITALS — BP 119/55 | HR 73 | Temp 97.9°F | Resp 18 | Ht 62.0 in | Wt 139.1 lb

## 2016-04-03 DIAGNOSIS — Z01812 Encounter for preprocedural laboratory examination: Secondary | ICD-10-CM | POA: Insufficient documentation

## 2016-04-03 DIAGNOSIS — I517 Cardiomegaly: Secondary | ICD-10-CM

## 2016-04-03 DIAGNOSIS — I700 Atherosclerosis of aorta: Secondary | ICD-10-CM | POA: Insufficient documentation

## 2016-04-03 DIAGNOSIS — Z0183 Encounter for blood typing: Secondary | ICD-10-CM

## 2016-04-03 DIAGNOSIS — Z01818 Encounter for other preprocedural examination: Secondary | ICD-10-CM | POA: Insufficient documentation

## 2016-04-03 DIAGNOSIS — I350 Nonrheumatic aortic (valve) stenosis: Secondary | ICD-10-CM

## 2016-04-03 HISTORY — DX: Abdominal aortic aneurysm, without rupture, unspecified: I71.40

## 2016-04-03 HISTORY — DX: Abdominal aortic aneurysm, without rupture: I71.4

## 2016-04-03 HISTORY — DX: Atherosclerotic heart disease of native coronary artery without angina pectoris: I25.10

## 2016-04-03 HISTORY — DX: Anxiety disorder, unspecified: F41.9

## 2016-04-03 HISTORY — DX: Gastro-esophageal reflux disease without esophagitis: K21.9

## 2016-04-03 HISTORY — DX: Reserved for inherently not codable concepts without codable children: IMO0001

## 2016-04-03 LAB — PROTIME-INR
INR: 1.06 (ref 0.00–1.49)
PROTHROMBIN TIME: 14 s (ref 11.6–15.2)

## 2016-04-03 LAB — COMPREHENSIVE METABOLIC PANEL
ALT: 13 U/L — AB (ref 14–54)
ANION GAP: 11 (ref 5–15)
AST: 20 U/L (ref 15–41)
Albumin: 3.7 g/dL (ref 3.5–5.0)
Alkaline Phosphatase: 65 U/L (ref 38–126)
BUN: 12 mg/dL (ref 6–20)
CALCIUM: 9.1 mg/dL (ref 8.9–10.3)
CHLORIDE: 107 mmol/L (ref 101–111)
CO2: 22 mmol/L (ref 22–32)
CREATININE: 0.55 mg/dL (ref 0.44–1.00)
Glucose, Bld: 93 mg/dL (ref 65–99)
Potassium: 4.1 mmol/L (ref 3.5–5.1)
SODIUM: 140 mmol/L (ref 135–145)
Total Bilirubin: 0.4 mg/dL (ref 0.3–1.2)
Total Protein: 6.5 g/dL (ref 6.5–8.1)

## 2016-04-03 LAB — ABO/RH: ABO/RH(D): A POS

## 2016-04-03 LAB — BLOOD GAS, ARTERIAL
ACID-BASE EXCESS: 0.3 mmol/L (ref 0.0–2.0)
Bicarbonate: 24.3 mEq/L — ABNORMAL HIGH (ref 20.0–24.0)
DRAWN BY: 421801
FIO2: 0.21
O2 SAT: 96.9 %
Patient temperature: 98.6
TCO2: 25.5 mmol/L (ref 0–100)
pCO2 arterial: 38.8 mmHg (ref 35.0–45.0)
pH, Arterial: 7.413 (ref 7.350–7.450)
pO2, Arterial: 94.5 mmHg (ref 80.0–100.0)

## 2016-04-03 LAB — URINALYSIS, ROUTINE W REFLEX MICROSCOPIC
GLUCOSE, UA: NEGATIVE mg/dL
HGB URINE DIPSTICK: NEGATIVE
Ketones, ur: 15 mg/dL — AB
Nitrite: NEGATIVE
PH: 6 (ref 5.0–8.0)
Protein, ur: NEGATIVE mg/dL
Specific Gravity, Urine: 1.036 — ABNORMAL HIGH (ref 1.005–1.030)

## 2016-04-03 LAB — URINE MICROSCOPIC-ADD ON

## 2016-04-03 LAB — CBC
HCT: 35.5 % — ABNORMAL LOW (ref 36.0–46.0)
Hemoglobin: 11.7 g/dL — ABNORMAL LOW (ref 12.0–15.0)
MCH: 30 pg (ref 26.0–34.0)
MCHC: 33 g/dL (ref 30.0–36.0)
MCV: 91 fL (ref 78.0–100.0)
PLATELETS: 223 10*3/uL (ref 150–400)
RBC: 3.9 MIL/uL (ref 3.87–5.11)
RDW: 14.2 % (ref 11.5–15.5)
WBC: 6.4 10*3/uL (ref 4.0–10.5)

## 2016-04-03 LAB — SURGICAL PCR SCREEN
MRSA, PCR: NEGATIVE
Staphylococcus aureus: NEGATIVE

## 2016-04-03 LAB — APTT: APTT: 36 s (ref 24–37)

## 2016-04-03 MED ORDER — EPINEPHRINE HCL 1 MG/ML IJ SOLN
0.0000 ug/min | INTRAVENOUS | Status: DC
  Filled 2016-04-03: qty 4

## 2016-04-03 MED ORDER — SODIUM CHLORIDE 0.9 % IV SOLN
INTRAVENOUS | Status: DC
  Filled 2016-04-03: qty 30

## 2016-04-03 MED ORDER — LEVOFLOXACIN IN D5W 500 MG/100ML IV SOLN
500.0000 mg | INTRAVENOUS | Status: AC
  Administered 2016-04-04: 500 mg via INTRAVENOUS
  Filled 2016-04-03: qty 100

## 2016-04-03 MED ORDER — PLASMA-LYTE 148 IV SOLN
INTRAVENOUS | Status: AC
  Administered 2016-04-04: 09:00:00
  Filled 2016-04-03: qty 2.5

## 2016-04-03 MED ORDER — DEXTROSE 5 % IV SOLN: 1.5000 g | INTRAVENOUS | Status: DC

## 2016-04-03 MED ORDER — DEXMEDETOMIDINE HCL IN NACL 400 MCG/100ML IV SOLN
0.1000 ug/kg/h | INTRAVENOUS | Status: AC
  Administered 2016-04-04: .3 ug/kg/h via INTRAVENOUS
  Filled 2016-04-03: qty 100

## 2016-04-03 MED ORDER — VANCOMYCIN HCL 10 G IV SOLR
1250.0000 mg | INTRAVENOUS | Status: AC
  Administered 2016-04-04: 1250 mg via INTRAVENOUS
  Filled 2016-04-03: qty 1250

## 2016-04-03 MED ORDER — SODIUM CHLORIDE 0.9 % IV SOLN
INTRAVENOUS | Status: AC
  Administered 2016-04-04: 69.8 mL/h via INTRAVENOUS
  Filled 2016-04-03: qty 40

## 2016-04-03 MED ORDER — NITROGLYCERIN IN D5W 200-5 MCG/ML-% IV SOLN
2.0000 ug/min | INTRAVENOUS | Status: AC
  Administered 2016-04-04: 5 ug/min via INTRAVENOUS
  Filled 2016-04-03: qty 250

## 2016-04-03 MED ORDER — PHENYLEPHRINE HCL 10 MG/ML IJ SOLN
30.0000 ug/min | INTRAVENOUS | Status: DC
  Filled 2016-04-03: qty 2

## 2016-04-03 MED ORDER — MAGNESIUM SULFATE 50 % IJ SOLN
40.0000 meq | INTRAMUSCULAR | Status: DC
  Filled 2016-04-03: qty 10

## 2016-04-03 MED ORDER — DEXTROSE 5 % IV SOLN
750.0000 mg | INTRAVENOUS | Status: DC
  Filled 2016-04-03: qty 750

## 2016-04-03 MED ORDER — SODIUM CHLORIDE 0.9 % IV SOLN
INTRAVENOUS | Status: AC
  Administered 2016-04-04: 1 [IU]/h via INTRAVENOUS
  Filled 2016-04-03: qty 2.5

## 2016-04-03 MED ORDER — POTASSIUM CHLORIDE 2 MEQ/ML IV SOLN
80.0000 meq | INTRAVENOUS | Status: DC
  Filled 2016-04-03: qty 40

## 2016-04-03 MED ORDER — DOPAMINE-DEXTROSE 3.2-5 MG/ML-% IV SOLN
0.0000 ug/kg/min | INTRAVENOUS | Status: DC
  Filled 2016-04-03: qty 250

## 2016-04-03 NOTE — Pre-Procedure Instructions (Signed)
Nicole GivensJanice D Dean  04/03/2016      [facility] [place] - [address] [place] [id number] Phone: [phone] Fax: [phone]  [facility] - [place], [place] - [address]  [place] [id number] Phone: [phone] Fax: [phone]    Your procedure is scheduled on 04/04/2016.  [facility] Admitting at 5:30 A.M.  Call this number if you have problems the morning of surgery:  [phone]   Remember:  Do not eat food or drink liquids after midnight.  onThursday night   Take these medicines the morning of surgery with A SIP OF WATER :  CARVEDILOL, BUSPAR, ZOLOFT   Do not wear jewelry, make-up or nail polish.   Do not wear lotions, powders, or perfumes.  You may wear deodorant.   Do not shave 48 hours prior to surgery.     Do not bring valuables to the hospital.   [facility] is not responsible for any belongings or valuables.  Contacts, dentures or bridgework may not be worn into surgery.  Leave your suitcase in the car.  After surgery it may be brought to your room.  For patients admitted to the hospital, discharge time will be determined by your treatment team.  Patients discharged the day of surgery will not be allowed to drive home.   Name and phone number of your driver:   With family  Special instructions:  Special Instructions: [place] - Preparing for Surgery  Before surgery, you can play an important role.  Because skin is not sterile, your skin needs to be as free of germs as possible.  You can reduce the number of germs on you skin by washing with CHG (chlorahexidine gluconate) soap before surgery.  CHG is an antiseptic cleaner which kills germs and bonds with the skin to continue killing germs even after washing.  Please DO NOT use if you have an allergy to CHG or antibacterial soaps.  If your skin becomes  reddened/irritated stop using the CHG and inform your nurse when you arrive at Short Stay.  Do not shave (including legs and underarms) for at least 48 hours prior to the first CHG shower.  You may shave your face.  Please follow these instructions carefully:   1.  Shower with CHG Soap the night before surgery and the  morning of Surgery.  2.  If you choose to wash your hair, wash your hair first as usual with your  normal shampoo.  3.  After you shampoo, rinse your hair and body thoroughly to remove the  Shampoo.  4.  Use CHG as you would any other liquid soap.  You can apply chg directly to the skin and wash gently with scrungie or a clean washcloth.  5.  Apply the CHG Soap to your body ONLY FROM THE NECK DOWN.    Do not use on open wounds or open sores.  Avoid contact with your eyes, ears, mouth and genitals (private parts).  Wash genitals (private parts)   with your normal soap.  6.  Wash thoroughly, paying special attention to the area where your surgery will be performed.  7.  Thoroughly rinse your body with warm water from the neck down.  8.  DO NOT shower/wash with your normal soap after using and rinsing off   the CHG Soap.  9.  Pat yourself dry with a clean towel.  10.  Wear clean pajamas.            11.  Place clean sheets on your bed the night of your first shower and do not sleep with pets.  Day of Surgery  Do not apply any lotions/deodorants the morning of surgery.  Please wear clean clothes to the hospital/surgery center.  Please read over the following fact sheets that you were given. Pain Booklet, Coughing and Deep Breathing, Blood Transfusion Information, MRSA Information and Surgical Site Infection Prevention

## 2016-04-04 ENCOUNTER — Inpatient Hospital Stay (HOSPITAL_COMMUNITY)
Admission: RE | Admit: 2016-04-04 | Discharge: 2016-04-10 | DRG: 236 | Disposition: A | Discharge status: Home Health | Payer: Medicare HMO | Source: Ambulatory Visit | Attending: Surgery | Admitting: Surgery

## 2016-04-04 ENCOUNTER — Inpatient Hospital Stay (HOSPITAL_COMMUNITY): Payer: Medicare HMO

## 2016-04-04 ENCOUNTER — Encounter (HOSPITAL_COMMUNITY)
Admission: RE | Disposition: A | Discharge status: Home Health | Payer: Self-pay | Source: Ambulatory Visit | Attending: Surgery

## 2016-04-04 ENCOUNTER — Inpatient Hospital Stay (HOSPITAL_COMMUNITY): Payer: Medicare HMO | Admitting: Anesthesiology

## 2016-04-04 ENCOUNTER — Encounter (HOSPITAL_COMMUNITY): Payer: Self-pay | Admitting: *Deleted

## 2016-04-04 DIAGNOSIS — Z87891 Personal history of nicotine dependence: Secondary | ICD-10-CM | POA: Diagnosis not present

## 2016-04-04 DIAGNOSIS — I714 Abdominal aortic aneurysm, without rupture, unspecified: Secondary | ICD-10-CM

## 2016-04-04 DIAGNOSIS — K219 Gastro-esophageal reflux disease without esophagitis: Secondary | ICD-10-CM | POA: Diagnosis present

## 2016-04-04 DIAGNOSIS — Z972 Presence of dental prosthetic device (complete) (partial): Secondary | ICD-10-CM | POA: Diagnosis not present

## 2016-04-04 DIAGNOSIS — E785 Hyperlipidemia, unspecified: Secondary | ICD-10-CM | POA: Diagnosis present

## 2016-04-04 DIAGNOSIS — D62 Acute posthemorrhagic anemia: Secondary | ICD-10-CM | POA: Diagnosis not present

## 2016-04-04 DIAGNOSIS — I2510 Atherosclerotic heart disease of native coronary artery without angina pectoris: Principal | ICD-10-CM | POA: Diagnosis present

## 2016-04-04 DIAGNOSIS — Z7982 Long term (current) use of aspirin: Secondary | ICD-10-CM

## 2016-04-04 DIAGNOSIS — Z951 Presence of aortocoronary bypass graft: Secondary | ICD-10-CM

## 2016-04-04 DIAGNOSIS — I350 Nonrheumatic aortic (valve) stenosis: Secondary | ICD-10-CM

## 2016-04-04 DIAGNOSIS — E8770 Fluid overload, unspecified: Secondary | ICD-10-CM | POA: Diagnosis not present

## 2016-04-04 DIAGNOSIS — Z885 Allergy status to narcotic agent status: Secondary | ICD-10-CM

## 2016-04-04 DIAGNOSIS — Z880 Allergy status to penicillin: Secondary | ICD-10-CM

## 2016-04-04 DIAGNOSIS — Z800 Family history of malignant neoplasm of digestive organs: Secondary | ICD-10-CM

## 2016-04-04 DIAGNOSIS — E872 Acidosis: Secondary | ICD-10-CM | POA: Diagnosis not present

## 2016-04-04 DIAGNOSIS — I25110 Atherosclerotic heart disease of native coronary artery with unstable angina pectoris: Secondary | ICD-10-CM

## 2016-04-04 DIAGNOSIS — R0689 Other abnormalities of breathing: Secondary | ICD-10-CM | POA: Diagnosis not present

## 2016-04-04 DIAGNOSIS — I10 Essential (primary) hypertension: Secondary | ICD-10-CM | POA: Diagnosis present

## 2016-04-04 HISTORY — PX: CORONARY ARTERY BYPASS GRAFT: SHX141

## 2016-04-04 HISTORY — PX: TEE WITHOUT CARDIOVERSION: SHX5443

## 2016-04-04 LAB — POCT I-STAT, CHEM 8
BUN: 10 mg/dL (ref 6–20)
BUN: 9 mg/dL (ref 6–20)
BUN: 8 mg/dL (ref 6–20)
BUN: 8 mg/dL (ref 6–20)
BUN: 7 mg/dL (ref 6–20)
CALCIUM ION: 1.2 mmol/L (ref 1.13–1.30)
CALCIUM ION: 1.26 mmol/L (ref 1.13–1.30)
CALCIUM ION: 1.06 mmol/L — AB (ref 1.13–1.30)
CALCIUM ION: 0.98 mmol/L — AB (ref 1.13–1.30)
CHLORIDE: 103 mmol/L (ref 101–111)
CREATININE: 0.4 mg/dL — AB (ref 0.44–1.00)
CREATININE: 0.3 mg/dL — AB (ref 0.44–1.00)
Calcium, Ion: 1.08 mmol/L — ABNORMAL LOW (ref 1.13–1.30)
Chloride: 105 mmol/L (ref 101–111)
Chloride: 101 mmol/L (ref 101–111)
Chloride: 99 mmol/L — ABNORMAL LOW (ref 101–111)
Chloride: 105 mmol/L (ref 101–111)
Creatinine, Ser: 0.4 mg/dL — ABNORMAL LOW (ref 0.44–1.00)
Creatinine, Ser: 0.4 mg/dL — ABNORMAL LOW (ref 0.44–1.00)
GLUCOSE: 105 mg/dL — AB (ref 65–99)
GLUCOSE: 118 mg/dL — AB (ref 65–99)
Glucose, Bld: 133 mg/dL — ABNORMAL HIGH (ref 65–99)
Glucose, Bld: 138 mg/dL — ABNORMAL HIGH (ref 65–99)
Glucose, Bld: 126 mg/dL — ABNORMAL HIGH (ref 65–99)
HCT: 31 % — ABNORMAL LOW (ref 36.0–46.0)
HCT: 26 % — ABNORMAL LOW (ref 36.0–46.0)
HCT: 25 % — ABNORMAL LOW (ref 36.0–46.0)
HEMATOCRIT: 22 % — AB (ref 36.0–46.0)
HEMATOCRIT: 23 % — AB (ref 36.0–46.0)
HEMOGLOBIN: 10.5 g/dL — AB (ref 12.0–15.0)
HEMOGLOBIN: 7.8 g/dL — AB (ref 12.0–15.0)
Hemoglobin: 8.8 g/dL — ABNORMAL LOW (ref 12.0–15.0)
Hemoglobin: 8.5 g/dL — ABNORMAL LOW (ref 12.0–15.0)
Hemoglobin: 7.5 g/dL — ABNORMAL LOW (ref 12.0–15.0)
POTASSIUM: 3.8 mmol/L (ref 3.5–5.1)
POTASSIUM: 4.5 mmol/L (ref 3.5–5.1)
Potassium: 3.8 mmol/L (ref 3.5–5.1)
Potassium: 5 mmol/L (ref 3.5–5.1)
Potassium: 6.3 mmol/L (ref 3.5–5.1)
SODIUM: 136 mmol/L (ref 135–145)
SODIUM: 144 mmol/L (ref 135–145)
Sodium: 141 mmol/L (ref 135–145)
Sodium: 141 mmol/L (ref 135–145)
Sodium: 135 mmol/L (ref 135–145)
TCO2: 28 mmol/L (ref 0–100)
TCO2: 28 mmol/L (ref 0–100)
TCO2: 28 mmol/L (ref 0–100)
TCO2: 28 mmol/L (ref 0–100)
TCO2: 26 mmol/L (ref 0–100)

## 2016-04-04 LAB — POCT I-STAT 3, ART BLOOD GAS (G3+)
ACID-BASE EXCESS: 6 mmol/L — AB (ref 0.0–2.0)
ACID-BASE EXCESS: 4 mmol/L — AB (ref 0.0–2.0)
ACID-BASE EXCESS: 1 mmol/L (ref 0.0–2.0)
Acid-Base Excess: 1 mmol/L (ref 0.0–2.0)
Acid-base deficit: 1 mmol/L (ref 0.0–2.0)
Acid-base deficit: 1 mmol/L (ref 0.0–2.0)
BICARBONATE: 26.9 meq/L — AB (ref 20.0–24.0)
Bicarbonate: 29.3 mEq/L — ABNORMAL HIGH (ref 20.0–24.0)
Bicarbonate: 29.6 mEq/L — ABNORMAL HIGH (ref 20.0–24.0)
Bicarbonate: 26.1 mEq/L — ABNORMAL HIGH (ref 20.0–24.0)
Bicarbonate: 25.5 mEq/L — ABNORMAL HIGH (ref 20.0–24.0)
Bicarbonate: 27.5 mEq/L — ABNORMAL HIGH (ref 20.0–24.0)
O2 SAT: 100 %
O2 SAT: 100 %
O2 Saturation: 96 %
O2 Saturation: 96 %
O2 Saturation: 96 %
O2 Saturation: 97 %
PH ART: 7.316 — AB (ref 7.350–7.450)
PO2 ART: 338 mmHg — AB (ref 80.0–100.0)
PO2 ART: 193 mmHg — AB (ref 80.0–100.0)
PO2 ART: 92 mmHg (ref 80.0–100.0)
Patient temperature: 37.1
TCO2: 30 mmol/L (ref 0–100)
TCO2: 31 mmol/L (ref 0–100)
TCO2: 28 mmol/L (ref 0–100)
TCO2: 27 mmol/L (ref 0–100)
TCO2: 28 mmol/L (ref 0–100)
TCO2: 29 mmol/L (ref 0–100)
pCO2 arterial: 30.5 mmHg — ABNORMAL LOW (ref 35.0–45.0)
pCO2 arterial: 53.5 mmHg — ABNORMAL HIGH (ref 35.0–45.0)
pCO2 arterial: 52.4 mmHg — ABNORMAL HIGH (ref 35.0–45.0)
pCO2 arterial: 53.5 mmHg — ABNORMAL HIGH (ref 35.0–45.0)
pCO2 arterial: 53 mmHg — ABNORMAL HIGH (ref 35.0–45.0)
pCO2 arterial: 54.9 mmHg — ABNORMAL HIGH (ref 35.0–45.0)
pH, Arterial: 7.579 — ABNORMAL HIGH (ref 7.350–7.450)
pH, Arterial: 7.352 (ref 7.350–7.450)
pH, Arterial: 7.3 — ABNORMAL LOW (ref 7.350–7.450)
pH, Arterial: 7.285 — ABNORMAL LOW (ref 7.350–7.450)
pH, Arterial: 7.312 — ABNORMAL LOW (ref 7.350–7.450)
pO2, Arterial: 90 mmHg (ref 80.0–100.0)
pO2, Arterial: 89 mmHg (ref 80.0–100.0)
pO2, Arterial: 107 mmHg — ABNORMAL HIGH (ref 80.0–100.0)

## 2016-04-04 LAB — CBC
HEMATOCRIT: 25.9 % — AB (ref 36.0–46.0)
HEMATOCRIT: 25.2 % — AB (ref 36.0–46.0)
HEMOGLOBIN: 8.3 g/dL — AB (ref 12.0–15.0)
Hemoglobin: 8.6 g/dL — ABNORMAL LOW (ref 12.0–15.0)
MCH: 30.6 pg (ref 26.0–34.0)
MCH: 29.9 pg (ref 26.0–34.0)
MCHC: 33.2 g/dL (ref 30.0–36.0)
MCHC: 32.9 g/dL (ref 30.0–36.0)
MCV: 92.2 fL (ref 78.0–100.0)
MCV: 90.6 fL (ref 78.0–100.0)
Platelets: 161 10*3/uL (ref 150–400)
Platelets: 149 10*3/uL — ABNORMAL LOW (ref 150–400)
RBC: 2.81 MIL/uL — ABNORMAL LOW (ref 3.87–5.11)
RBC: 2.78 MIL/uL — ABNORMAL LOW (ref 3.87–5.11)
RDW: 14.1 % (ref 11.5–15.5)
RDW: 14.1 % (ref 11.5–15.5)
WBC: 10.4 10*3/uL (ref 4.0–10.5)
WBC: 8.3 10*3/uL (ref 4.0–10.5)

## 2016-04-04 LAB — POCT I-STAT 4, (NA,K, GLUC, HGB,HCT)
GLUCOSE: 124 mg/dL — AB (ref 65–99)
HCT: 26 % — ABNORMAL LOW (ref 36.0–46.0)
HEMOGLOBIN: 8.8 g/dL — AB (ref 12.0–15.0)
POTASSIUM: 4 mmol/L (ref 3.5–5.1)
SODIUM: 142 mmol/L (ref 135–145)

## 2016-04-04 LAB — POCT I-STAT 3, VENOUS BLOOD GAS (G3P V)
Acid-Base Excess: 3 mmol/L — ABNORMAL HIGH (ref 0.0–2.0)
BICARBONATE: 27 meq/L — AB (ref 20.0–24.0)
O2 SAT: 79 %
PH VEN: 7.491 — AB (ref 7.250–7.300)
TCO2: 28 mmol/L (ref 0–100)
pCO2, Ven: 34.2 mmHg — ABNORMAL LOW (ref 45.0–50.0)
pO2, Ven: 33 mmHg (ref 31.0–45.0)

## 2016-04-04 LAB — MAGNESIUM: Magnesium: 3.3 mg/dL — ABNORMAL HIGH (ref 1.7–2.4)

## 2016-04-04 LAB — HEMOGLOBIN AND HEMATOCRIT, BLOOD
HCT: 21.6 % — ABNORMAL LOW (ref 36.0–46.0)
HEMOGLOBIN: 7.2 g/dL — AB (ref 12.0–15.0)

## 2016-04-04 LAB — CREATININE, SERUM
Creatinine, Ser: 0.46 mg/dL (ref 0.44–1.00)
GFR calc non Af Amer: >60 mL/min (ref >60)

## 2016-04-04 LAB — PROTIME-INR
INR: 1.52 — AB (ref 0.00–1.49)
Prothrombin Time: 18.3 seconds — ABNORMAL HIGH (ref 11.6–15.2)

## 2016-04-04 LAB — HEMOGLOBIN A1C
HEMOGLOBIN A1C: 5.8 % — AB (ref 4.8–5.6)
MEAN PLASMA GLUCOSE: 120 mg/dL

## 2016-04-04 LAB — APTT: APTT: 39 s — AB (ref 24–37)

## 2016-04-04 LAB — PLATELET COUNT: PLATELETS: 156 10*3/uL (ref 150–400)

## 2016-04-04 SURGERY — CORONARY ARTERY BYPASS GRAFTING (CABG)
Anesthesia: General | Site: Chest

## 2016-04-04 MED ORDER — BISACODYL 10 MG RE SUPP: 10.0000 mg | Freq: Every day | RECTAL | Status: DC

## 2016-04-04 MED ORDER — VANCOMYCIN HCL IN DEXTROSE 1-5 GM/200ML-% IV SOLN
1000.0000 mg | Freq: Once | INTRAVENOUS | Status: AC
  Administered 2016-04-04: 1000 mg via INTRAVENOUS
  Filled 2016-04-04: qty 200

## 2016-04-04 MED ORDER — METOPROLOL TARTRATE 12.5 MG HALF TABLET
12.5000 mg | ORAL_TABLET | Freq: Two times a day (BID) | ORAL | Status: DC
  Administered 2016-04-05 – 2016-04-10 (×10): 12.5 mg via ORAL
  Filled 2016-04-04 (×10): qty 1

## 2016-04-04 MED ORDER — ATORVASTATIN CALCIUM 40 MG PO TABS
40.0000 mg | ORAL_TABLET | Freq: Every day | ORAL | Status: DC
  Administered 2016-04-05 – 2016-04-09 (×5): 40 mg via ORAL
  Filled 2016-04-04 (×5): qty 1

## 2016-04-04 MED ORDER — CHLORHEXIDINE GLUCONATE 0.12% ORAL RINSE (MEDLINE KIT)
15.0000 mL | Freq: Two times a day (BID) | OROMUCOSAL | Status: DC
  Administered 2016-04-04 – 2016-04-05 (×2): 15 mL via OROMUCOSAL

## 2016-04-04 MED ORDER — FENTANYL CITRATE (PF) 250 MCG/5ML IJ SOLN
INTRAMUSCULAR | Status: AC
  Filled 2016-04-04: qty 10

## 2016-04-04 MED ORDER — BUSPIRONE HCL 15 MG PO TABS
15.0000 mg | ORAL_TABLET | Freq: Two times a day (BID) | ORAL | Status: DC
  Administered 2016-04-05 – 2016-04-10 (×11): 15 mg via ORAL
  Filled 2016-04-04 (×4): qty 1
  Filled 2016-04-04 (×2): qty 3
  Filled 2016-04-04 (×2): qty 1
  Filled 2016-04-04: qty 3
  Filled 2016-04-04 (×7): qty 1

## 2016-04-04 MED ORDER — FAMOTIDINE IN NACL 20-0.9 MG/50ML-% IV SOLN
20.0000 mg | Freq: Two times a day (BID) | INTRAVENOUS | Status: DC
  Administered 2016-04-04: 20 mg via INTRAVENOUS

## 2016-04-04 MED ORDER — POTASSIUM CHLORIDE 10 MEQ/50ML IV SOLN: 10.0000 meq | INTRAVENOUS | Status: AC

## 2016-04-04 MED ORDER — HEPARIN SODIUM (PORCINE) 1000 UNIT/ML IJ SOLN
INTRAMUSCULAR | Status: DC | PRN
  Administered 2016-04-04: 19000 [IU] via INTRAVENOUS

## 2016-04-04 MED ORDER — LACTATED RINGERS IV SOLN: INTRAVENOUS | Status: DC

## 2016-04-04 MED ORDER — SODIUM CHLORIDE 0.9% FLUSH
3.0000 mL | Freq: Two times a day (BID) | INTRAVENOUS | Status: DC
  Administered 2016-04-05 – 2016-04-10 (×7): 3 mL via INTRAVENOUS

## 2016-04-04 MED ORDER — LACTATED RINGERS IV SOLN
INTRAVENOUS | Status: DC | PRN
  Administered 2016-04-04 (×2): via INTRAVENOUS

## 2016-04-04 MED ORDER — MIDAZOLAM HCL 5 MG/5ML IJ SOLN
INTRAMUSCULAR | Status: DC | PRN
  Administered 2016-04-04: 3 mg via INTRAVENOUS
  Administered 2016-04-04: 1 mg via INTRAVENOUS
  Administered 2016-04-04 (×2): 2 mg via INTRAVENOUS
  Administered 2016-04-04 (×2): 1 mg via INTRAVENOUS

## 2016-04-04 MED ORDER — DEXMEDETOMIDINE HCL IN NACL 200 MCG/50ML IV SOLN
0.0000 ug/kg/h | INTRAVENOUS | Status: DC
  Filled 2016-04-04: qty 50

## 2016-04-04 MED ORDER — SERTRALINE HCL 100 MG PO TABS
100.0000 mg | ORAL_TABLET | Freq: Every day | ORAL | Status: DC
  Administered 2016-04-05 – 2016-04-10 (×6): 100 mg via ORAL
  Filled 2016-04-04 (×6): qty 1

## 2016-04-04 MED ORDER — SODIUM CHLORIDE 0.45 % IV SOLN: INTRAVENOUS | Status: DC | PRN

## 2016-04-04 MED ORDER — ALBUMIN HUMAN 5 % IV SOLN
250.0000 mL | INTRAVENOUS | Status: AC | PRN
Start: 2016-04-04 — End: 2016-04-05
  Administered 2016-04-04 (×3): 250 mL via INTRAVENOUS
  Filled 2016-04-04: qty 250

## 2016-04-04 MED ORDER — ACETAMINOPHEN 160 MG/5ML PO SOLN
1000.0000 mg | Freq: Four times a day (QID) | ORAL | Status: AC
  Administered 2016-04-04 – 2016-04-05 (×2): 1000 mg
  Filled 2016-04-04 (×2): qty 40.6

## 2016-04-04 MED ORDER — THROMBIN 20000 UNITS EX SOLR
CUTANEOUS | Status: DC | PRN
  Administered 2016-04-04: 20000 [IU] via TOPICAL

## 2016-04-04 MED ORDER — THROMBIN 20000 UNITS EX SOLR
CUTANEOUS | Status: AC
  Filled 2016-04-04: qty 20000

## 2016-04-04 MED ORDER — DEXTROSE 5 % IV SOLN
1.5000 g | Freq: Two times a day (BID) | INTRAVENOUS | Status: AC
  Administered 2016-04-04 – 2016-04-06 (×4): 1.5 g via INTRAVENOUS
  Filled 2016-04-04 (×4): qty 1.5

## 2016-04-04 MED ORDER — MORPHINE SULFATE (PF) 2 MG/ML IV SOLN
1.0000 mg | INTRAVENOUS | Status: DC | PRN
  Administered 2016-04-04: 1 mg via INTRAVENOUS
  Filled 2016-04-04: qty 1

## 2016-04-04 MED ORDER — INSULIN REGULAR BOLUS VIA INFUSION
0.0000 [IU] | Freq: Three times a day (TID) | INTRAVENOUS | Status: DC
  Filled 2016-04-04: qty 10

## 2016-04-04 MED ORDER — LACTATED RINGERS IV SOLN: 500.0000 mL | Freq: Once | INTRAVENOUS | Status: DC | PRN

## 2016-04-04 MED ORDER — SODIUM CHLORIDE 0.9 % IV SOLN: INTRAVENOUS | Status: DC

## 2016-04-04 MED ORDER — ONDANSETRON HCL 4 MG/2ML IJ SOLN
4.0000 mg | Freq: Four times a day (QID) | INTRAMUSCULAR | Status: DC | PRN
  Administered 2016-04-05: 4 mg via INTRAVENOUS
  Filled 2016-04-04: qty 2

## 2016-04-04 MED ORDER — CHLORHEXIDINE GLUCONATE 0.12 % MT SOLN: 15.0000 mL | Freq: Once | OROMUCOSAL | Status: DC

## 2016-04-04 MED ORDER — ASPIRIN 81 MG PO CHEW: 324.0000 mg | CHEWABLE_TABLET | Freq: Every day | ORAL | Status: DC

## 2016-04-04 MED ORDER — ROCURONIUM BROMIDE 100 MG/10ML IV SOLN
INTRAVENOUS | Status: DC | PRN
  Administered 2016-04-04: 50 mg via INTRAVENOUS

## 2016-04-04 MED ORDER — CHLORHEXIDINE GLUCONATE 0.12 % MT SOLN
15.0000 mL | OROMUCOSAL | Status: AC
  Administered 2016-04-04: 15 mL via OROMUCOSAL

## 2016-04-04 MED ORDER — FENTANYL CITRATE (PF) 100 MCG/2ML IJ SOLN
INTRAMUSCULAR | Status: DC | PRN
  Administered 2016-04-04 (×2): 50 ug via INTRAVENOUS
  Administered 2016-04-04: 100 ug via INTRAVENOUS
  Administered 2016-04-04 (×2): 50 ug via INTRAVENOUS
  Administered 2016-04-04: 250 ug via INTRAVENOUS
  Administered 2016-04-04: 1100 ug via INTRAVENOUS
  Administered 2016-04-04: 50 ug via INTRAVENOUS
  Administered 2016-04-04 (×2): 150 ug via INTRAVENOUS

## 2016-04-04 MED ORDER — PROPOFOL 10 MG/ML IV BOLUS
INTRAVENOUS | Status: AC
  Filled 2016-04-04: qty 20

## 2016-04-04 MED ORDER — LACTATED RINGERS IV SOLN
INTRAVENOUS | Status: DC | PRN
  Administered 2016-04-04 (×3): via INTRAVENOUS

## 2016-04-04 MED ORDER — METOPROLOL TARTRATE 25 MG/10 ML ORAL SUSPENSION: 12.5000 mg | Freq: Two times a day (BID) | ORAL | Status: DC

## 2016-04-04 MED ORDER — PROTAMINE SULFATE 10 MG/ML IV SOLN
INTRAVENOUS | Status: DC | PRN
Start: 2016-04-04 — End: 2016-04-04
  Administered 2016-04-04: 200 mg via INTRAVENOUS
  Administered 2016-04-04: 10 mg via INTRAVENOUS

## 2016-04-04 MED ORDER — PROPOFOL 10 MG/ML IV BOLUS
INTRAVENOUS | Status: DC | PRN
  Administered 2016-04-04: 80 mg via INTRAVENOUS
  Administered 2016-04-04: 50 mg via INTRAVENOUS
  Administered 2016-04-04: 70 mg via INTRAVENOUS

## 2016-04-04 MED ORDER — NITROGLYCERIN 0.2 MG/ML ON CALL CATH LAB
INTRAVENOUS | Status: DC | PRN
  Administered 2016-04-04: 80 ug via INTRAVENOUS
  Administered 2016-04-04: 160 ug via INTRAVENOUS
  Administered 2016-04-04: 120 ug via INTRAVENOUS
  Administered 2016-04-04: 40 ug via INTRAVENOUS
  Administered 2016-04-04: 80 ug via INTRAVENOUS
  Administered 2016-04-04: 40 ug via INTRAVENOUS
  Administered 2016-04-04: 80 ug via INTRAVENOUS

## 2016-04-04 MED ORDER — MAGNESIUM SULFATE 4 GM/100ML IV SOLN
4.0000 g | Freq: Once | INTRAVENOUS | Status: AC
  Administered 2016-04-04: 4 g via INTRAVENOUS
  Filled 2016-04-04: qty 100

## 2016-04-04 MED ORDER — BISACODYL 5 MG PO TBEC
10.0000 mg | DELAYED_RELEASE_TABLET | Freq: Every day | ORAL | Status: DC
  Administered 2016-04-05 – 2016-04-08 (×3): 10 mg via ORAL
  Filled 2016-04-04 (×4): qty 2

## 2016-04-04 MED ORDER — PANTOPRAZOLE SODIUM 40 MG PO TBEC
40.0000 mg | DELAYED_RELEASE_TABLET | Freq: Every day | ORAL | Status: DC
  Administered 2016-04-06 – 2016-04-10 (×5): 40 mg via ORAL
  Filled 2016-04-04 (×5): qty 1

## 2016-04-04 MED ORDER — MIDAZOLAM HCL 2 MG/2ML IJ SOLN: 2.0000 mg | INTRAMUSCULAR | Status: DC | PRN

## 2016-04-04 MED ORDER — PANTOPRAZOLE SODIUM 40 MG PO TBEC: 80.0000 mg | DELAYED_RELEASE_TABLET | Freq: Every day | ORAL | Status: DC

## 2016-04-04 MED ORDER — SODIUM CHLORIDE 0.9% FLUSH: 3.0000 mL | INTRAVENOUS | Status: DC | PRN

## 2016-04-04 MED ORDER — METOPROLOL TARTRATE 12.5 MG HALF TABLET: 12.5000 mg | ORAL_TABLET | Freq: Once | ORAL | Status: DC

## 2016-04-04 MED ORDER — TRAMADOL HCL 50 MG PO TABS
50.0000 mg | ORAL_TABLET | ORAL | Status: DC | PRN
  Administered 2016-04-05: 50 mg via ORAL
  Administered 2016-04-05: 100 mg via ORAL
  Administered 2016-04-05 – 2016-04-06 (×4): 50 mg via ORAL
  Administered 2016-04-07: 100 mg via ORAL
  Administered 2016-04-07: 50 mg via ORAL
  Administered 2016-04-08 – 2016-04-09 (×2): 100 mg via ORAL
  Filled 2016-04-04 (×2): qty 1
  Filled 2016-04-04: qty 2
  Filled 2016-04-04 (×2): qty 1
  Filled 2016-04-04: qty 2
  Filled 2016-04-04 (×2): qty 1
  Filled 2016-04-04: qty 2
  Filled 2016-04-04 (×2): qty 1

## 2016-04-04 MED ORDER — FENTANYL CITRATE (PF) 250 MCG/5ML IJ SOLN
INTRAMUSCULAR | Status: AC
  Filled 2016-04-04: qty 25

## 2016-04-04 MED ORDER — NITROGLYCERIN IN D5W 200-5 MCG/ML-% IV SOLN: 0.0000 ug/min | INTRAVENOUS | Status: DC

## 2016-04-04 MED ORDER — DEXTROSE 5 % IV SOLN
0.0000 ug/min | INTRAVENOUS | Status: DC
  Filled 2016-04-04: qty 2

## 2016-04-04 MED ORDER — ASPIRIN EC 325 MG PO TBEC
325.0000 mg | DELAYED_RELEASE_TABLET | Freq: Every day | ORAL | Status: DC
  Administered 2016-04-05 – 2016-04-10 (×6): 325 mg via ORAL
  Filled 2016-04-04 (×6): qty 1

## 2016-04-04 MED ORDER — HEMOSTATIC AGENTS (NO CHARGE) OPTIME
TOPICAL | Status: DC | PRN
  Administered 2016-04-04 (×2): 1 via TOPICAL

## 2016-04-04 MED ORDER — FENTANYL CITRATE (PF) 250 MCG/5ML IJ SOLN
INTRAMUSCULAR | Status: AC
  Filled 2016-04-04: qty 5

## 2016-04-04 MED ORDER — SODIUM CHLORIDE 0.9 % IV SOLN: 250.0000 mL | INTRAVENOUS | Status: DC

## 2016-04-04 MED ORDER — ANTISEPTIC ORAL RINSE SOLUTION (CORINZ)
7.0000 mL | Freq: Four times a day (QID) | OROMUCOSAL | Status: DC
  Administered 2016-04-04 – 2016-04-05 (×3): 7 mL via OROMUCOSAL

## 2016-04-04 MED ORDER — INSULIN REGULAR HUMAN 100 UNIT/ML IJ SOLN
INTRAMUSCULAR | Status: DC
  Administered 2016-04-05: 1.9 [IU]/h via INTRAVENOUS
  Filled 2016-04-04: qty 2.5

## 2016-04-04 MED ORDER — DOCUSATE SODIUM 100 MG PO CAPS
200.0000 mg | ORAL_CAPSULE | Freq: Every day | ORAL | Status: DC
  Administered 2016-04-05 – 2016-04-09 (×4): 200 mg via ORAL
  Filled 2016-04-04 (×5): qty 2

## 2016-04-04 MED ORDER — METOCLOPRAMIDE HCL 5 MG/ML IJ SOLN
10.0000 mg | Freq: Four times a day (QID) | INTRAMUSCULAR | Status: AC
  Administered 2016-04-04 – 2016-04-05 (×3): 10 mg via INTRAVENOUS
  Filled 2016-04-04 (×2): qty 2

## 2016-04-04 MED ORDER — MORPHINE SULFATE (PF) 2 MG/ML IV SOLN
2.0000 mg | INTRAVENOUS | Status: DC | PRN
  Administered 2016-04-04: 2 mg via INTRAVENOUS
  Administered 2016-04-05: 4 mg via INTRAVENOUS
  Administered 2016-04-05 (×2): 2 mg via INTRAVENOUS
  Filled 2016-04-04 (×3): qty 1
  Filled 2016-04-04: qty 2

## 2016-04-04 MED ORDER — MIDAZOLAM HCL 10 MG/2ML IJ SOLN
INTRAMUSCULAR | Status: AC
  Filled 2016-04-04: qty 2

## 2016-04-04 MED ORDER — ACETAMINOPHEN 500 MG PO TABS
1000.0000 mg | ORAL_TABLET | Freq: Four times a day (QID) | ORAL | Status: AC
  Administered 2016-04-05 – 2016-04-09 (×17): 1000 mg via ORAL
  Filled 2016-04-04 (×19): qty 2

## 2016-04-04 MED ORDER — ARTIFICIAL TEARS OP OINT
TOPICAL_OINTMENT | OPHTHALMIC | Status: DC | PRN
  Administered 2016-04-04: 1 via OPHTHALMIC

## 2016-04-04 MED ORDER — LACTATED RINGERS IV SOLN
INTRAVENOUS | Status: DC | PRN
  Administered 2016-04-04: 07:00:00 via INTRAVENOUS

## 2016-04-04 MED ORDER — THROMBIN 20000 UNITS EX SOLR
OROMUCOSAL | Status: DC | PRN
  Administered 2016-04-04 (×3): via TOPICAL

## 2016-04-04 MED ORDER — VECURONIUM BROMIDE 10 MG IV SOLR
INTRAVENOUS | Status: DC | PRN
  Administered 2016-04-04 (×3): 5 mg via INTRAVENOUS

## 2016-04-04 MED ORDER — OXYCODONE HCL 5 MG PO TABS
5.0000 mg | ORAL_TABLET | ORAL | Status: DC | PRN
  Filled 2016-04-04: qty 2

## 2016-04-04 MED ORDER — CHLORHEXIDINE GLUCONATE 4 % EX LIQD: 30.0000 mL | CUTANEOUS | Status: DC

## 2016-04-04 MED ORDER — TRAZODONE HCL 100 MG PO TABS
100.0000 mg | ORAL_TABLET | Freq: Every day | ORAL | Status: DC
  Administered 2016-04-05 – 2016-04-09 (×5): 100 mg via ORAL
  Filled 2016-04-04 (×5): qty 1

## 2016-04-04 MED ORDER — ACETAMINOPHEN 160 MG/5ML PO SOLN: 650.0000 mg | Freq: Once | ORAL | Status: AC

## 2016-04-04 MED ORDER — ACETAMINOPHEN 650 MG RE SUPP
650.0000 mg | Freq: Once | RECTAL | Status: AC
  Administered 2016-04-04: 650 mg via RECTAL

## 2016-04-04 MED ORDER — METOPROLOL TARTRATE 5 MG/5ML IV SOLN: 2.5000 mg | INTRAVENOUS | Status: DC | PRN

## 2016-04-04 SURGICAL SUPPLY — 106 items
BAG DECANTER FOR FLEXI CONT (MISCELLANEOUS) ×4 IMPLANT
BANDAGE ELASTIC 4 VELCRO ST LF (GAUZE/BANDAGES/DRESSINGS) ×4 IMPLANT
BANDAGE ELASTIC 6 VELCRO ST LF (GAUZE/BANDAGES/DRESSINGS) ×4 IMPLANT
BASKET HEART  (ORDER IN 25'S) (MISCELLANEOUS) ×1
BASKET HEART (ORDER IN 25'S) (MISCELLANEOUS) ×1
BASKET HEART (ORDER IN 25S) (MISCELLANEOUS) ×2 IMPLANT
BLADE STERNUM SYSTEM 6 (BLADE) ×4 IMPLANT
BLADE SURG 11 STRL SS (BLADE) ×4 IMPLANT
BNDG GAUZE ELAST 4 BULKY (GAUZE/BANDAGES/DRESSINGS) ×4 IMPLANT
CANISTER SUCTION 2500CC (MISCELLANEOUS) ×4 IMPLANT
CATH ROBINSON RED A/P 18FR (CATHETERS) ×8 IMPLANT
CATH THORACIC 28FR (CATHETERS) ×4 IMPLANT
CATH THORACIC 36FR (CATHETERS) ×4 IMPLANT
CATH THORACIC 36FR RT ANG (CATHETERS) ×4 IMPLANT
CLIP TI MEDIUM 24 (CLIP) IMPLANT
CLIP TI WIDE RED SMALL 24 (CLIP) ×12 IMPLANT
COVER SURGICAL LIGHT HANDLE (MISCELLANEOUS) ×4 IMPLANT
CRADLE DONUT ADULT HEAD (MISCELLANEOUS) ×4 IMPLANT
DERMABOND ADVANCED (GAUZE/BANDAGES/DRESSINGS) ×4
DERMABOND ADVANCED .7 DNX12 (GAUZE/BANDAGES/DRESSINGS) ×4 IMPLANT
DRAPE CARDIOVASCULAR INCISE (DRAPES) ×2
DRAPE SLUSH/WARMER DISC (DRAPES) ×4 IMPLANT
DRAPE SRG 135X102X78XABS (DRAPES) ×2 IMPLANT
DRSG AQUACEL AG ADV 3.5X14 (GAUZE/BANDAGES/DRESSINGS) ×4 IMPLANT
DRSG COVADERM 4X14 (GAUZE/BANDAGES/DRESSINGS) ×4 IMPLANT
ELECT CAUTERY BLADE 6.4 (BLADE) ×4 IMPLANT
ELECT REM PT RETURN 9FT ADLT (ELECTROSURGICAL) ×8
ELECTRODE REM PT RTRN 9FT ADLT (ELECTROSURGICAL) ×4 IMPLANT
FELT TEFLON 1X6 (MISCELLANEOUS) ×4 IMPLANT
GAUZE SPONGE 4X4 12PLY STRL (GAUZE/BANDAGES/DRESSINGS) ×8 IMPLANT
GLOVE BIO SURGEON STRL SZ 6 (GLOVE) IMPLANT
GLOVE BIO SURGEON STRL SZ 6.5 (GLOVE) IMPLANT
GLOVE BIO SURGEON STRL SZ7 (GLOVE) IMPLANT
GLOVE BIO SURGEON STRL SZ7.5 (GLOVE) IMPLANT
GLOVE BIO SURGEONS STRL SZ 6.5 (GLOVE)
GLOVE BIOGEL PI IND STRL 6 (GLOVE) IMPLANT
GLOVE BIOGEL PI IND STRL 6.5 (GLOVE) IMPLANT
GLOVE BIOGEL PI IND STRL 7.0 (GLOVE) IMPLANT
GLOVE BIOGEL PI INDICATOR 6 (GLOVE)
GLOVE BIOGEL PI INDICATOR 6.5 (GLOVE)
GLOVE BIOGEL PI INDICATOR 7.0 (GLOVE)
GLOVE EUDERMIC 7 POWDERFREE (GLOVE) ×8 IMPLANT
GLOVE ORTHO TXT STRL SZ7.5 (GLOVE) IMPLANT
GOWN STRL REUS W/ TWL LRG LVL3 (GOWN DISPOSABLE) ×8 IMPLANT
GOWN STRL REUS W/ TWL XL LVL3 (GOWN DISPOSABLE) ×2 IMPLANT
GOWN STRL REUS W/TWL LRG LVL3 (GOWN DISPOSABLE) ×8
GOWN STRL REUS W/TWL XL LVL3 (GOWN DISPOSABLE) ×2
HEMOSTAT POWDER SURGIFOAM 1G (HEMOSTASIS) ×12 IMPLANT
HEMOSTAT SURGICEL 2X14 (HEMOSTASIS) ×8 IMPLANT
INSERT FOGARTY 61MM (MISCELLANEOUS) IMPLANT
INSERT FOGARTY XLG (MISCELLANEOUS) IMPLANT
KIT BASIN OR (CUSTOM PROCEDURE TRAY) ×4 IMPLANT
KIT CATH CPB BARTLE (MISCELLANEOUS) ×4 IMPLANT
KIT ROOM TURNOVER OR (KITS) ×4 IMPLANT
KIT SUCTION CATH 14FR (SUCTIONS) ×4 IMPLANT
KIT VASOVIEW 6 PRO VH 2400 (KITS) ×4 IMPLANT
NS IRRIG 1000ML POUR BTL (IV SOLUTION) ×20 IMPLANT
PACK OPEN HEART (CUSTOM PROCEDURE TRAY) ×4 IMPLANT
PAD ARMBOARD 7.5X6 YLW CONV (MISCELLANEOUS) ×8 IMPLANT
PAD ELECT DEFIB RADIOL ZOLL (MISCELLANEOUS) ×4 IMPLANT
PENCIL BUTTON HOLSTER BLD 10FT (ELECTRODE) ×4 IMPLANT
PUNCH AORTIC ROTATE 4.0MM (MISCELLANEOUS) IMPLANT
PUNCH AORTIC ROTATE 4.5MM 8IN (MISCELLANEOUS) ×4 IMPLANT
PUNCH AORTIC ROTATE 5MM 8IN (MISCELLANEOUS) IMPLANT
SET CARDIOPLEGIA MPS 5001102 (MISCELLANEOUS) ×4 IMPLANT
SOLUTION ANTI FOG 6CC (MISCELLANEOUS) ×4 IMPLANT
SPONGE GAUZE 4X4 12PLY STER LF (GAUZE/BANDAGES/DRESSINGS) ×8 IMPLANT
SPONGE INTESTINAL PEANUT (DISPOSABLE) IMPLANT
SPONGE LAP 18X18 X RAY DECT (DISPOSABLE) ×12 IMPLANT
SPONGE LAP 4X18 X RAY DECT (DISPOSABLE) ×12 IMPLANT
SUT BONE WAX W31G (SUTURE) ×4 IMPLANT
SUT MNCRL AB 4-0 PS2 18 (SUTURE) IMPLANT
SUT PROLENE 3 0 SH DA (SUTURE) IMPLANT
SUT PROLENE 3 0 SH1 36 (SUTURE) ×4 IMPLANT
SUT PROLENE 4 0 RB 1 (SUTURE)
SUT PROLENE 4 0 SH DA (SUTURE) IMPLANT
SUT PROLENE 4-0 RB1 .5 CRCL 36 (SUTURE) IMPLANT
SUT PROLENE 5 0 C 1 36 (SUTURE) IMPLANT
SUT PROLENE 6 0 C 1 30 (SUTURE) ×8 IMPLANT
SUT PROLENE 7 0 BV 1 (SUTURE) IMPLANT
SUT PROLENE 7 0 BV1 MDA (SUTURE) ×4 IMPLANT
SUT PROLENE 8 0 BV175 6 (SUTURE) ×20 IMPLANT
SUT SILK  1 MH (SUTURE) ×2
SUT SILK 1 MH (SUTURE) ×2 IMPLANT
SUT SILK 2 0 SH CR/8 (SUTURE) ×4 IMPLANT
SUT STEEL 6MS V (SUTURE) ×8 IMPLANT
SUT STEEL STERNAL CCS#1 18IN (SUTURE) IMPLANT
SUT STEEL SZ 6 DBL 3X14 BALL (SUTURE) IMPLANT
SUT VIC AB 1 CTX 36 (SUTURE) ×4
SUT VIC AB 1 CTX36XBRD ANBCTR (SUTURE) ×4 IMPLANT
SUT VIC AB 2-0 CT1 27 (SUTURE) ×4
SUT VIC AB 2-0 CT1 TAPERPNT 27 (SUTURE) ×4 IMPLANT
SUT VIC AB 2-0 CTX 27 (SUTURE) IMPLANT
SUT VIC AB 3-0 SH 27 (SUTURE)
SUT VIC AB 3-0 SH 27X BRD (SUTURE) IMPLANT
SUT VIC AB 3-0 X1 27 (SUTURE) IMPLANT
SUT VICRYL 4-0 PS2 18IN ABS (SUTURE) IMPLANT
SUTURE E-PAK OPEN HEART (SUTURE) ×4 IMPLANT
SYSTEM SAHARA CHEST DRAIN ATS (WOUND CARE) ×12 IMPLANT
TAPE CLOTH SURG 4X10 WHT LF (GAUZE/BANDAGES/DRESSINGS) ×8 IMPLANT
TOWEL OR 17X24 6PK STRL BLUE (TOWEL DISPOSABLE) ×4 IMPLANT
TOWEL OR 17X26 10 PK STRL BLUE (TOWEL DISPOSABLE) ×4 IMPLANT
TRAY FOLEY IC TEMP SENS 16FR (CATHETERS) ×4 IMPLANT
TUBING INSUFFLATION (TUBING) ×4 IMPLANT
UNDERPAD 30X30 INCONTINENT (UNDERPADS AND DIAPERS) ×4 IMPLANT
WATER STERILE IRR 1000ML POUR (IV SOLUTION) ×8 IMPLANT

## 2016-04-04 NOTE — Brief Op Note (Signed)
04/04/2016  12:03 PM  PATIENT:  Talitha GivensJanice D Knoop  75 y.o. female  PRE-OPERATIVE DIAGNOSIS:  CAD  POST-OPERATIVE DIAGNOSIS:  CAD  PROCEDURE:  Procedure(s):  CORONARY ARTERY BYPASS GRAFTING  -LIMA to LAD -SVG to DIAGONAL -SVG to OM -RIMA to PDA  ENDOSCOPIC HARVEST GREATER SAPHENOUS VEIN -Right Thigh -Left Leg explored- no vein found to harvest  TRANSESOPHAGEAL ECHOCARDIOGRAM (TEE) (N/A)  SURGEON:  Surgeon(s) and Role:    * Alleen BorneBryan K Bartle, MD - Primary  PHYSICIAN ASSISTANT: Lowella DandyErin Calel Pisarski PA-C  ANESTHESIA:   general  EBL:  Total I/O In: 1000 [I.V.:1000] Out: 1800 [Urine:1800]  BLOOD ADMINISTERED:CELLSAVER  DRAINS: Left and Right Pleural Chest Tube, Mediastinal Chest Drain   LOCAL MEDICATIONS USED:  NONE  SPECIMEN:  No Specimen  DISPOSITION OF SPECIMEN:  N/A  COUNTS:  YES  TOURNIQUET:  * No tourniquets in log *  DICTATION: .Dragon Dictation  PLAN OF CARE: Admit to inpatient   PATIENT DISPOSITION:  ICU - intubated and hemodynamically stable.   Delay start of Pharmacological VTE agent (>24hrs) due to surgical blood loss or risk of bleeding: yes

## 2016-04-04 NOTE — Transfer of Care (Signed)
Immediate Anesthesia Transfer of Care Note  Patient: Talitha GivensJanice D Bezek  Procedure(s) Performed: Procedure(s): CORONARY ARTERY BYPASS GRAFTING (CABG) x 4 using bilateral internal mammary arteries and right saphenous vein harvested by endovein (N/A) TRANSESOPHAGEAL ECHOCARDIOGRAM (TEE) (N/A)  Patient Location: SICU  Anesthesia Type:General  Level of Consciousness: Patient remains intubated per anesthesia plan  Airway & Oxygen Therapy: Patient remains intubated per anesthesia plan  Post-op Assessment: Report given to RN and Post -op Vital signs reviewed and stable  Post vital signs: Reviewed and stable  Last Vitals:  Filed Vitals:   04/04/16 0556 04/04/16 1411  BP: 145/57 84/53  Pulse: 70 80  Temp: 36.6 C 36 C  Resp: 16 12    Last Pain: There were no vitals filed for this visit.       Complications: No apparent anesthesia complications

## 2016-04-04 NOTE — Progress Notes (Signed)
CT surgery p.m. Rounds  Status post CABG 4 Patient hemodynamically stable with clear chest x-ray However initial vent wean attempt was unsuccessful because of hypercarbia and mild respiratory acidosis. Patient remain on ventilator for another 2 hours and then reattempt a vent wean after sedation wears off

## 2016-04-04 NOTE — Procedures (Signed)
600 VC , -32 NIF , good pt effort.

## 2016-04-04 NOTE — Procedures (Signed)
Extubation Procedure Note  Patient Details:   Name: Nicole Dean DOB: [birth year] MRN: [id number]   Airway Documentation:  Airway 8 mm (Active)  Secured at (cm) 22 cm 04/04/2016  7:32 PM  Measured From Lips 04/04/2016  7:32 PM  Secured Location Right 04/04/2016  7:32 PM  Secured By [employer] Tube Holder;Pink Tape 04/04/2016  7:32 PM  Site Condition Dry 04/04/2016  7:32 PM    Evaluation  O2 sats: stable throughout Complications: No apparent complications Patient did tolerate procedure well. Bilateral Breath Sounds: Clear  Pt extubated to 4 LNC tolerated well and able to speak name. Pt also has a good cough,RT will continue to monitor. Yes  Jebediah Macrae Myrle ShengD Sharrod Achille 04/04/2016, 9:44 PM

## 2016-04-04 NOTE — Interval H&P Note (Signed)
History and Physical Interval Note:  04/04/2016 5:44 AM  Talitha GivensJanice D Duce  has presented today for surgery, with the diagnosis of CAD  The various methods of treatment have been discussed with the patient and family. After consideration of risks, benefits and other options for treatment, the patient has consented to  Procedure(s): CORONARY ARTERY BYPASS GRAFTING (CABG) (N/A) TRANSESOPHAGEAL ECHOCARDIOGRAM (TEE) (N/A) as a surgical intervention .  The patient's history has been reviewed, patient examined, no change in status, stable for surgery.  I have reviewed the patient's chart and labs.  Questions were answered to the patient's satisfaction.     Alleen BorneBryan K Coreyon Nicotra

## 2016-04-04 NOTE — Anesthesia Procedure Notes (Addendum)
Central Venous Catheter Insertion Performed by: anesthesiologist Patient location: Pre-op. Preanesthetic checklist: patient identified, IV checked, site marked, risks and benefits discussed, surgical consent, monitors and equipment checked, pre-op evaluation, timeout performed and anesthesia consent Position: Trendelenburg Lidocaine 1% used for infiltration Landmarks identified and Seldinger technique used Catheter size: 8.5 Fr Central line and PA cath was placed.Sheath introducer Swan type and PA catheter depth:thermodilation and 50PA Cath depth:50 Procedure performed using ultrasound guided technique. Attempts: 1 Following insertion, line sutured and dressing applied. Post procedure assessment: blood return through all ports, free fluid flow and no air. Patient tolerated the procedure well with no immediate complications.   Procedure Name: Intubation Date/Time: 04/04/2016 8:16 AM Performed by: Dairl PonderJIANG, Aslan Montagna Pre-anesthesia Checklist: Patient identified, Timeout performed, Emergency Drugs available, Suction available and Patient being monitored Patient Re-evaluated:Patient Re-evaluated prior to inductionOxygen Delivery Method: Circle system utilized Preoxygenation: Pre-oxygenation with 100% oxygen Intubation Type: IV induction Ventilation: Mask ventilation without difficulty and Oral airway inserted - appropriate to patient size Laryngoscope Size: Mac and 3 Grade View: Grade I Tube type: Oral Tube size: 8.0 mm Number of attempts: 1 Airway Equipment and Method: Stylet Placement Confirmation: ETT inserted through vocal cords under direct vision,  breath sounds checked- equal and bilateral and positive ETCO2 Secured at: 22 cm Tube secured with: Tape Dental Injury: Teeth and Oropharynx as per pre-operative assessment

## 2016-04-04 NOTE — H&P (Signed)
[address]       [place] [address]             [phone]      Cardiothoracic Surgery History and Physical    PCP is [facility] Referring Provider is [place], Peter M, MD  Chief Complaint  Patient presents with  . Coronary Artery Disease    eval for CABG...CATH 03/31/16...noted during cardiac clearance for AAA REPAIR    HPI:  The patient is a 75 year old remote smoker with hypertension, hyperlipidemia and AAA who recently had a CT for follow up of her AAA which showed that it had further enlarged to 6.0 cm. She was evaluated for open surgical repair by Dr. [name] and a preop cardiac workup by Dr. Elease Hashimoto included an intermediate risk nuclear stress test showing mild ischemia in the RCA territory with an EF of 63%. Cardiac cath yesterday showed 70% LM and severe 3-vessel CAD with sequential 90% LAD stenoses, 80% proximal LCX stenosis and proximal RCA occlusion with LAD to distal RCA collaterals. LVEF was 55-65%.   She reports at least a 6 month history of substernal chest pain and shortness of breath with exertion but also at night before she goes to bed. She has had tiredness and fatigue and can't walk much without giving out due to tiredness and leg weakness.  Past Medical History  Diagnosis Date  . Arthritis   . Depression   . Hyperlipidemia   . Hypertension     Past Surgical History  Procedure Laterality Date  . Shoulder arthroscopy w/ rotator cuff repair Right   . Peripheral vascular catheterization N/A 03/03/2016    Procedure: Abdominal Aortogram w/Lower Extremity; Surgeon: Chuck Hint, MD; Location: [facility] INVASIVE CV LAB; Service: Cardiovascular; Laterality: N/A;  . Cardiac catheterization N/A 03/31/2016    Procedure: Left Heart Cath and Coronary Angiography; Surgeon: Peter M Swaziland, MD; Location: [facility] INVASIVE CV LAB; Service: Cardiovascular; Laterality:  N/A;    Family History  Problem Relation Age of Onset  . Cancer Father     stomach    Social History Social History  Substance Use Topics  . Smoking status: Former Smoker -- 1.50 packs/day for 35 years    Types: Cigarettes    Quit date: 02/15/1996  . Smokeless tobacco: Never Used  . Alcohol Use: No    Current Outpatient Prescriptions  Medication Sig Dispense Refill  . aspirin EC 81 MG tablet Take 1 tablet (81 mg total) by mouth daily.    Marland Kitchen atorvastatin (LIPITOR) 40 MG tablet Take 1 tablet (40 mg total) by mouth daily. 30 tablet 11  . busPIRone (BUSPAR) 15 MG tablet Take 15 mg by mouth 2 (two) times daily. 15 mg every morning and 30 mg every evening    . carvedilol (COREG) 6.25 MG tablet Take 1 tablet (6.25 mg total) by mouth 2 (two) times daily. 60 tablet 11  . enalapril (VASOTEC) 10 MG tablet Take 1 tablet (10 mg total) by mouth daily. 30 tablet 11  . hydrochlorothiazide (HYDRODIURIL) 25 MG tablet Take 25 mg by mouth daily.    . Multiple Vitamins-Iron (MULTIVITAMIN/IRON PO) Take 1 tablet by mouth daily.    . Omega-3 Fatty Acids (FISH OIL) 1200 MG CAPS Take 1,200 mg by mouth 2 (two) times daily.    Marland Kitchen omeprazole (PRILOSEC) 40 MG capsule Take 40 mg by mouth daily.    . sertraline (ZOLOFT) 100 MG tablet Take 100 mg by  mouth daily.    . traMADol (ULTRAM) 50 MG tablet Take 50 mg by mouth every 12 (twelve) hours as needed for moderate pain.     . traZODone (DESYREL) 100 MG tablet Take 100 mg by mouth at bedtime.    . busPIRone (BUSPAR) 30 MG tablet Take 30 mg by mouth 2 (two) times daily. Reported on 04/01/2016     No current facility-administered medications for this visit.    Allergies  Allergen Reactions  . Codeine Nausea And Vomiting    Severe, ended up in ED  . Penicillins Nausea Only    Has patient had a PCN reaction causing immediate rash,  facial/tongue/throat swelling, SOB or lightheadedness with hypotension: Yes Has patient had a PCN reaction causing severe rash involving mucus membranes or skin necrosis: No Has patient had a PCN reaction that required hospitalization No Has patient had a PCN reaction occurring within the last 10 years: No If all of the above answers are "NO", then may proceed with Cephalosporin use.     Review of Systems  Constitutional: Positive for activity change and fatigue. Negative for appetite change.  HENT: Negative.   Dentures  Eyes: Negative.  Respiratory: Positive for chest tightness and shortness of breath.  Cardiovascular: Positive for chest pain. Negative for palpitations and leg swelling.  Endocrine: Negative.  Genitourinary: Negative.  Musculoskeletal: Positive for arthralgias.  Skin: Negative.  Allergic/Immunologic: Negative.  Neurological: Negative.  Hematological: Bruises/bleeds easily.  Psychiatric/Behavioral: Negative.    BP 119/62 mmHg  Pulse 71  Resp 16  Ht 5\' 2"  (1.575 m)  Wt 138 lb (62.596 kg)  BMI 25.23 kg/m2  SpO2 95% Physical Exam  Constitutional: She is oriented to person, place, and time.  Elderly woman in no distress  HENT:  Head: Normocephalic and atraumatic.  Mouth/Throat: Oropharynx is clear and moist.  Eyes: EOM are normal. Pupils are equal, round, and reactive to light.  Neck: Normal range of motion. Neck supple. No JVD present. No thyromegaly present.  Cardiovascular: Normal rate, regular rhythm and normal heart sounds.  No murmur heard. Pulmonary/Chest: Effort normal and breath sounds normal. No respiratory distress.  Abdominal: Soft. Bowel sounds are normal. She exhibits no distension and no mass. There is no tenderness.  Musculoskeletal: Normal range of motion. She exhibits no edema.  Lymphadenopathy:   She has no cervical adenopathy.  Neurological: She is alert and oriented to person, place, and time. She has normal strength.  No cranial nerve deficit or sensory deficit.  Skin: Skin is warm and dry.  Psychiatric: She has a normal mood and affect.     Diagnostic Tests:   Peter M SwazilandJordan, MD (Primary)      Procedures    Left Heart Cath and Coronary Angiography    Conclusion     Ost LM to LM lesion, 70% stenosed.  Prox LAD to Mid LAD lesion, 90% stenosed.  Mid LAD lesion, 90% stenosed.  Ost Cx lesion, 80% stenosed.  Prox RCA to Mid RCA lesion, 100% stenosed.  The left ventricular systolic function is normal.  1. Severe 3 vessel and left main CAD 2. Normal LV function  Plan: Recommend CABG.      Indications    Abnormal nuclear stress test [R93.1 (ICD-10-CM)]    Technique and Indications    Indication: 75 yo WF with AAA and abnormal Myoview study.  Procedural Details: The right wrist was prepped, draped, and anesthetized with 1% lidocaine. Using the modified Seldinger technique, a 6 French slender sheath was introduced  into the right radial artery. 3 mg of verapamil was administered through the sheath, weight-based unfractionated heparin was administered intravenously. Standard Judkins catheters were used for selective coronary angiography and left ventriculography. Catheter exchanges were performed over an exchange length guidewire. There were no immediate procedural complications. A TR band was used for radial hemostasis at the completion of the procedure. The patient was transferred to the post catheterization recovery area for further monitoring.  Contrast: 75 cc  During this procedure the patient is administered a total of Versed 1 mg and Fentanyl 25 mg to achieve and maintain moderate conscious sedation. The patient's heart rate, blood pressure, and oxygen saturation are monitored continuously during the procedure. The period of conscious sedation is 20 minutes, of which I was present face-to-face 100% of this time. Estimated blood loss <50 mL. There were no  immediate complications during the procedure.    Coronary Findings    Dominance: Right   Left Main   . Ost LM to LM lesion, 70% stenosed. Discrete.     Left Anterior Descending   . Prox LAD to Mid LAD lesion, 90% stenosed. Moderately Calcified.   . Mid LAD lesion, 90% stenosed. Discrete.     Left Circumflex   . Ost Cx lesion, 80% stenosed. Discrete.     Right Coronary Artery   . Prox RCA to Mid RCA lesion, 100% stenosed.   . Right Posterior Descending Artery   RPDA filled by collaterals from 3rd Sept.      Wall Motion                 Left Heart    Left Ventricle The left ventricular size is normal. The left ventricular systolic function is normal. The left ventricular ejection fraction is 55-65% by visual estimate. There are no wall motion abnormalities in the left ventricle.    Coronary Diagrams    Diagnostic Diagram            Implants    No implant documentation for this case.    PACS Images    Show images for Cardiac catheterization     Link to Procedure Log    Procedure Log      Hemo Data       Most Recent Value   AO Systolic Pressure  106 mmHg   AO Diastolic Pressure  46 mmHg   AO Mean  69 mmHg   LV Systolic Pressure  111 mmHg   LV Diastolic Pressure  4 mmHg   LV EDP  12 mmHg   Arterial Occlusion Pressure Extended Systolic Pressure  110 mmHg   Arterial Occlusion Pressure Extended Diastolic Pressure  41 mmHg   Arterial Occlusion Pressure Extended Mean Pressure  67 mmHg   Left Ventricular Apex Extended Systolic Pressure  111 mmHg   Left Ventricular Apex Extended Diastolic Pressure  4 mmHg   Left Ventricular Apex Extended EDP Pressure  12 mmHg     Impression:  I have personally reviewed her cath films and CT scans and discussed the results with her and her daughter who is a Engineer, civil (consulting). She  has 70% left main and severe 3-vessel coronary artery disease with progressive anginal symptoms that have occurred at rest in the evening but mostly with mild exertion. She also has a 6.0 cm AAA that requires open surgical repair. I agree that CABG is the best treatment for her followed by AAA repair in 2-3 months after she recovers adequately. I discussed the operative procedure with the patient  and her daughter including alternatives, benefits and risks; including but not limited to bleeding, blood transfusion, infection, stroke, myocardial infarction, graft failure, heart block requiring a permanent pacemaker, organ dysfunction, and death. [name] understands and agrees to proceed.   Plan:  CABG on Friday 04/04/2016  Alleen Borne, MD [facility] [phone]

## 2016-04-04 NOTE — Anesthesia Postprocedure Evaluation (Signed)
Anesthesia Post Note  Patient: Nicole GivensJanice D Dean  Procedure(s) Performed: Procedure(s) (LRB): CORONARY ARTERY BYPASS GRAFTING (CABG) x 4 using bilateral internal mammary arteries and right saphenous vein harvested by endovein (N/A) TRANSESOPHAGEAL ECHOCARDIOGRAM (TEE) (N/A)  Patient location during evaluation: SICU Anesthesia Type: General Level of consciousness: sedated Pain management: pain level controlled Vital Signs Assessment: post-procedure vital signs reviewed and stable Respiratory status: patient remains intubated per anesthesia plan Cardiovascular status: stable Anesthetic complications: no    Last Vitals:  Filed Vitals:   04/04/16 1545 04/04/16 1600  BP:  86/62  Pulse: 79 89  Temp: 36.1 C 36.2 C  Resp: 16 16    Last Pain: There were no vitals filed for this visit.               Firman Petrow DAVID

## 2016-04-04 NOTE — Progress Notes (Signed)
  Echocardiogram Echocardiogram Transesophageal has been performed.  Nicole Dean 04/04/2016, 8:56 AM

## 2016-04-04 NOTE — Progress Notes (Signed)
Relayed blood gas results to Dr. Laneta SimmersBartle. Orders received to proceed with extubation. Will continue to monitor closely. Modena JanskyKevin Laymon Stockert RN [address] [place]

## 2016-04-04 NOTE — Progress Notes (Addendum)
Rapid wean protocol started at 1715, gas at 1810 did not meet parameters, Dr. Donata ClayVan Trigt making rounds and made aware, gave verbal order to give the patient a few hours and try again.  Hermina BartersBOWMAN, Gunter Conde M, RN

## 2016-04-04 NOTE — Anesthesia Preprocedure Evaluation (Signed)
Anesthesia Evaluation  Patient identified by MRN, date of birth, ID band Patient awake    Reviewed: Allergy & Precautions, NPO status , Patient's Chart, lab work & pertinent test results  Airway Mallampati: I  TM Distance: >3 FB Neck ROM: Full    Dental   Pulmonary former smoker,    Pulmonary exam normal        Cardiovascular hypertension, Pt. on medications + CAD  Normal cardiovascular exam     Neuro/Psych Anxiety Depression    GI/Hepatic GERD  Medicated and Controlled,  Endo/Other    Renal/GU      Musculoskeletal   Abdominal   Peds  Hematology   Anesthesia Other Findings   Reproductive/Obstetrics                             Anesthesia Physical Anesthesia Plan  ASA: III  Anesthesia Plan: General   Post-op Pain Management:    Induction: Intravenous  Airway Management Planned: Oral ETT  Additional Equipment: Arterial line, CVP, PA Cath, TEE and Ultrasound Guidance Line Placement  Intra-op Plan:   Post-operative Plan: Post-operative intubation/ventilation  Informed Consent: I have reviewed the patients History and Physical, chart, labs and discussed the procedure including the risks, benefits and alternatives for the proposed anesthesia with the patient or authorized representative who has indicated his/her understanding and acceptance.     Plan Discussed with: CRNA and Surgeon  Anesthesia Plan Comments:         Anesthesia Quick Evaluation

## 2016-04-04 NOTE — Progress Notes (Signed)
Called Dr. Laneta SimmersBartle about gas on arrival to SICU, Dr. Laneta SimmersBartle gave verbal order to increase rate to 16, called RT and made them aware of the rate change.  Hermina BartersBOWMAN, [place] M, RN

## 2016-04-05 ENCOUNTER — Inpatient Hospital Stay (HOSPITAL_COMMUNITY): Payer: Medicare HMO

## 2016-04-05 LAB — CBC
HCT: 25.1 % — ABNORMAL LOW (ref 36.0–46.0)
HEMATOCRIT: 25.7 % — AB (ref 36.0–46.0)
HEMOGLOBIN: 8 g/dL — AB (ref 12.0–15.0)
HEMOGLOBIN: 8.2 g/dL — AB (ref 12.0–15.0)
MCH: 29.3 pg (ref 26.0–34.0)
MCH: 29.4 pg (ref 26.0–34.0)
MCHC: 31.9 g/dL (ref 30.0–36.0)
MCHC: 31.9 g/dL (ref 30.0–36.0)
MCV: 91.9 fL (ref 78.0–100.0)
MCV: 92.1 fL (ref 78.0–100.0)
Platelets: 158 10*3/uL (ref 150–400)
Platelets: 174 10*3/uL (ref 150–400)
RBC: 2.73 MIL/uL — ABNORMAL LOW (ref 3.87–5.11)
RBC: 2.79 MIL/uL — ABNORMAL LOW (ref 3.87–5.11)
RDW: 14.5 % (ref 11.5–15.5)
RDW: 14.6 % (ref 11.5–15.5)
WBC: 8.9 10*3/uL (ref 4.0–10.5)
WBC: 9.9 10*3/uL (ref 4.0–10.5)

## 2016-04-05 LAB — GLUCOSE, CAPILLARY
GLUCOSE-CAPILLARY: 103 mg/dL — AB (ref 65–99)
GLUCOSE-CAPILLARY: 116 mg/dL — AB (ref 65–99)
GLUCOSE-CAPILLARY: 117 mg/dL — AB (ref 65–99)
GLUCOSE-CAPILLARY: 117 mg/dL — AB (ref 65–99)
GLUCOSE-CAPILLARY: 120 mg/dL — AB (ref 65–99)
GLUCOSE-CAPILLARY: 122 mg/dL — AB (ref 65–99)
GLUCOSE-CAPILLARY: 127 mg/dL — AB (ref 65–99)
GLUCOSE-CAPILLARY: 130 mg/dL — AB (ref 65–99)
GLUCOSE-CAPILLARY: 145 mg/dL — AB (ref 65–99)
GLUCOSE-CAPILLARY: 150 mg/dL — AB (ref 65–99)
Glucose-Capillary: 136 mg/dL — ABNORMAL HIGH (ref 65–99)
Glucose-Capillary: 122 mg/dL — ABNORMAL HIGH (ref 65–99)
Glucose-Capillary: 121 mg/dL — ABNORMAL HIGH (ref 65–99)
Glucose-Capillary: 134 mg/dL — ABNORMAL HIGH (ref 65–99)
Glucose-Capillary: 124 mg/dL — ABNORMAL HIGH (ref 65–99)
Glucose-Capillary: 124 mg/dL — ABNORMAL HIGH (ref 65–99)
Glucose-Capillary: 102 mg/dL — ABNORMAL HIGH (ref 65–99)
Glucose-Capillary: 104 mg/dL — ABNORMAL HIGH (ref 65–99)
Glucose-Capillary: 163 mg/dL — ABNORMAL HIGH (ref 65–99)
Glucose-Capillary: 93 mg/dL (ref 65–99)
Glucose-Capillary: 153 mg/dL — ABNORMAL HIGH (ref 65–99)
Glucose-Capillary: 105 mg/dL — ABNORMAL HIGH (ref 65–99)

## 2016-04-05 LAB — MAGNESIUM
MAGNESIUM: 2.5 mg/dL — AB (ref 1.7–2.4)
Magnesium: 2.1 mg/dL (ref 1.7–2.4)

## 2016-04-05 LAB — POCT I-STAT, CHEM 8
BUN: 9 mg/dL (ref 6–20)
CALCIUM ION: 1.15 mmol/L (ref 1.13–1.30)
Chloride: 98 mmol/L — ABNORMAL LOW (ref 101–111)
Creatinine, Ser: 0.5 mg/dL (ref 0.44–1.00)
GLUCOSE: 127 mg/dL — AB (ref 65–99)
HCT: 27 % — ABNORMAL LOW (ref 36.0–46.0)
HEMOGLOBIN: 9.2 g/dL — AB (ref 12.0–15.0)
Potassium: 3.9 mmol/L (ref 3.5–5.1)
SODIUM: 139 mmol/L (ref 135–145)
TCO2: 29 mmol/L (ref 0–100)

## 2016-04-05 LAB — BASIC METABOLIC PANEL
Anion gap: 9 (ref 5–15)
BUN: 7 mg/dL (ref 6–20)
CHLORIDE: 107 mmol/L (ref 101–111)
CO2: 26 mmol/L (ref 22–32)
CREATININE: 0.54 mg/dL (ref 0.44–1.00)
Calcium: 7.7 mg/dL — ABNORMAL LOW (ref 8.9–10.3)
GFR calc Af Amer: >60 mL/min (ref >60)
GFR calc non Af Amer: >60 mL/min (ref >60)
Glucose, Bld: 112 mg/dL — ABNORMAL HIGH (ref 65–99)
Potassium: 4.1 mmol/L (ref 3.5–5.1)
SODIUM: 142 mmol/L (ref 135–145)

## 2016-04-05 LAB — CREATININE, SERUM
Creatinine, Ser: 0.57 mg/dL (ref 0.44–1.00)
GFR calc Af Amer: >60 mL/min (ref >60)
GFR calc non Af Amer: >60 mL/min (ref >60)

## 2016-04-05 MED ORDER — DM-GUAIFENESIN ER 30-600 MG PO TB12
1.0000 | ORAL_TABLET | Freq: Two times a day (BID) | ORAL | Status: DC
  Administered 2016-04-05 – 2016-04-10 (×10): 1 via ORAL
  Filled 2016-04-05 (×11): qty 1

## 2016-04-05 MED ORDER — FENTANYL CITRATE (PF) 100 MCG/2ML IJ SOLN
25.0000 ug | INTRAMUSCULAR | Status: DC | PRN
  Administered 2016-04-05: 25 ug via INTRAVENOUS
  Filled 2016-04-05: qty 2

## 2016-04-05 MED ORDER — METOCLOPRAMIDE HCL 5 MG/ML IJ SOLN
10.0000 mg | Freq: Four times a day (QID) | INTRAMUSCULAR | Status: AC
  Administered 2016-04-06 – 2016-04-08 (×6): 10 mg via INTRAVENOUS
  Filled 2016-04-05 (×6): qty 2

## 2016-04-05 MED ORDER — INSULIN ASPART 100 UNIT/ML ~~LOC~~ SOLN
0.0000 [IU] | SUBCUTANEOUS | Status: DC
  Administered 2016-04-05 (×2): 2 [IU] via SUBCUTANEOUS

## 2016-04-05 MED ORDER — MORPHINE SULFATE (PF) 2 MG/ML IV SOLN: 1.0000 mg | INTRAVENOUS | Status: DC | PRN

## 2016-04-05 MED ORDER — OXYCODONE HCL 5 MG PO TABS: 5.0000 mg | ORAL_TABLET | ORAL | Status: DC | PRN

## 2016-04-05 MED ORDER — MIDAZOLAM HCL 2 MG/2ML IJ SOLN: 1.0000 mg | INTRAMUSCULAR | Status: DC | PRN

## 2016-04-05 MED ORDER — METOCLOPRAMIDE HCL 5 MG/ML IJ SOLN
10.0000 mg | Freq: Four times a day (QID) | INTRAMUSCULAR | Status: AC
  Administered 2016-04-05 – 2016-04-06 (×4): 10 mg via INTRAVENOUS
  Filled 2016-04-05 (×4): qty 2

## 2016-04-05 MED ORDER — CETYLPYRIDINIUM CHLORIDE 0.05 % MT LIQD
7.0000 mL | Freq: Two times a day (BID) | OROMUCOSAL | Status: DC
  Administered 2016-04-05 – 2016-04-09 (×8): 7 mL via OROMUCOSAL

## 2016-04-05 MED ORDER — FUROSEMIDE 10 MG/ML IJ SOLN
20.0000 mg | Freq: Two times a day (BID) | INTRAMUSCULAR | Status: DC
  Administered 2016-04-05 – 2016-04-06 (×3): 20 mg via INTRAVENOUS
  Filled 2016-04-05 (×3): qty 2

## 2016-04-05 MED ORDER — FE FUMARATE-B12-VIT C-FA-IFC PO CAPS
1.0000 | ORAL_CAPSULE | Freq: Every morning | ORAL | Status: DC
  Administered 2016-04-05 – 2016-04-10 (×6): 1 via ORAL
  Filled 2016-04-05 (×6): qty 1

## 2016-04-05 NOTE — Progress Notes (Signed)
1 Day Post-Op Procedure(s) (LRB): CORONARY ARTERY BYPASS GRAFTING (CABG) x 4 using bilateral internal mammary arteries and right saphenous vein harvested by endovein (N/A) TRANSESOPHAGEAL ECHOCARDIOGRAM (TEE) (N/A) Subjective: Stable postop multivessel CABG Patient had delayed extubation due to hypercarbia Now awake and breathing comfortably complaining of some incisional pain Patient appears weak and frail Sinus rhythm, stable hemodynamics We'll proceed with progression orders  Objective: Vital signs in last 24 hours: Temp:  [96.6 F (35.9 C)-100.4 F (38 C)] 99.1 F (37.3 C) (05/06 0815) Pulse Rate:  [78-90] 81 (05/06 0815) Cardiac Rhythm:  [-] Atrial paced (05/06 0430) Resp:  [10-21] 17 (05/06 0815) BP: (84-121)/(47-71) 108/49 mmHg (05/06 0800) SpO2:  [95 %-100 %] 100 % (05/06 0815) Arterial Line BP: (75-148)/(37-75) 122/57 mmHg (05/06 0815) FiO2 (%):  [40 %-50 %] 40 % (05/05 2014) Weight:  [145 lb 8.1 oz (66 kg)] 145 lb 8.1 oz (66 kg) (05/06 0500)  Hemodynamic parameters for last 24 hours: PAP: (16-35)/(3-22) 27/14 mmHg CO:  [2.3 L/min-5.1 L/min] 5.1 L/min CI:  [1.4 L/min/m2-3.1 L/min/m2] 3.1 L/min/m2  Intake/Output from previous day: 05/05 0701 - 05/06 0700 In: 5797.6 [I.V.:4347.6; Blood:250; IV Piggyback:1200] Out: 8159 [Urine:5010; Blood:2439; Chest Tube:710] Intake/Output this shift: Total I/O In: 101.3 [I.V.:101.3] Out: 120 [Urine:100; Chest Tube:20]        Exam    General- alert and comfortable   Lungs- clear without rales, wheezes   Cor- regular rate and rhythm, no murmur , gallop   Abdomen- soft, non-tender   Extremities - warm, non-tender, minimal edema   Neuro- oriented, appropriate, no focal weakness   Lab Results:  Recent Labs  04/04/16 2010 04/04/16 2015 04/05/16 0402  WBC 8.3  --  8.9  HGB 8.3* 7.8* 8.0*  HCT 25.2* 23.0* 25.1*  PLT 149*  --  158   BMET:  Recent Labs  04/03/16 0917  04/04/16 2015 04/05/16 0402  NA 140  < > 144 142   K 4.1  < > 4.5 4.1  CL 107  < > 105 107  CO2 22  --   --  26  GLUCOSE 93  < > 126* 112*  BUN 12  < > 7 7  CREATININE 0.55  < > 0.40* 0.54  CALCIUM 9.1  --   --  7.7*  < > = values in this interval not displayed.  PT/INR:  Recent Labs  04/04/16 1501  LABPROT 18.3*  INR 1.52*   ABG    Component Value Date/Time   PHART 7.312* 04/04/2016 2230   HCO3 27.5* 04/04/2016 2230   TCO2 29 04/04/2016 2230   ACIDBASEDEF 1.0 04/04/2016 1812   O2SAT 97.0 04/04/2016 2230   CBG (last 3)   Recent Labs  04/04/16 2229 04/04/16 2331 04/05/16 0016  GLUCAP 134* 116* 117*    Assessment/Plan: S/P Procedure(s) (LRB): CORONARY ARTERY BYPASS GRAFTING (CABG) x 4 using bilateral internal mammary arteries and right saphenous vein harvested by endovein (N/A) TRANSESOPHAGEAL ECHOCARDIOGRAM (TEE) (N/A) Mobilize Diuresis Diabetes control d/c tubes/lines See progression orders Expected postoperative blood loss anemia  Start by mouth iron   LOS: 1 day    Kathlee Nationseter Van Trigt III 04/05/2016

## 2016-04-05 NOTE — Op Note (Signed)
CARDIOVASCULAR SURGERY OPERATIVE NOTE  04/04/2016  Surgeon:  Alleen Borne, MD  First Assistant: Lowella Dandy, PA-C   Preoperative Diagnosis:  Severe multi-vessel coronary artery disease   Postoperative Diagnosis:  Same   Procedure:  1. Median Sternotomy 2. Extracorporeal circulation 3.   Coronary artery bypass grafting x 4   Left internal mammary graft to the LAD  Right internal mammary graft to the PDA  SVG to diagonal  SVG to OM   4.   Endoscopic vein harvest from the right leg   Anesthesia:  General Endotracheal   Clinical History/Surgical Indication:   The patient is a 75 year old remote smoker with hypertension, hyperlipidemia and AAA who recently had a CT for follow up of her AAA which showed that it had further enlarged to 6.0 cm. She was evaluated for open surgical repair by Dr. [name] and a preop cardiac workup by Dr. Elease Hashimoto included an intermediate risk nuclear stress test showing mild ischemia in the RCA territory with an EF of 63%. Cardiac cath yesterday showed 70% LM and severe 3-vessel CAD with sequential 90% LAD stenoses, 80% proximal LCX stenosis and proximal RCA occlusion with LAD to distal RCA collaterals. LVEF was 55-65%.   She reports at least a 6 month history of substernal chest pain and shortness of breath with exertion but also at night before she goes to bed. She has had tiredness and fatigue and can't walk much without giving out due to tiredness and leg weakness.    I have personally reviewed her cath films and CT scans and discussed the results with her and her daughter who is a Engineer, civil (consulting). She has 70% left main and severe 3-vessel coronary artery disease with progressive anginal symptoms that have occurred at rest in the evening but mostly with mild exertion. She also has a 6.0 cm AAA that requires open surgical repair. I agree that CABG is the best  treatment for her followed by AAA repair in 2-3 months after she recovers adequately. I discussed the operative procedure with the patient and her daughter including alternatives, benefits and risks; including but not limited to bleeding, blood transfusion, infection, stroke, myocardial infarction, graft failure, heart block requiring a permanent pacemaker, organ dysfunction, and death. [name] understands and agrees to proceed.   Preparation:  The patient was seen in the preoperative holding area and the correct patient, correct operation were confirmed with the patient after reviewing the medical record and catheterization. The consent was signed by me. Preoperative antibiotics were given. A pulmonary arterial line and radial arterial line were placed by the anesthesia team. The patient was taken back to the operating room and positioned supine on the operating room table. After being placed under general endotracheal anesthesia by the anesthesia team a foley catheter was placed. The neck, chest, abdomen, and both legs were prepped with betadine soap and solution and draped in the usual sterile manner. A surgical time-out was taken and the correct patient and operative procedure were confirmed with the nursing and anesthesia staff.   Cardiopulmonary Bypass:  A median sternotomy was performed. The pericardium was opened in the midline. Right ventricular function appeared normal. The ascending aorta was of normal size and had no palpable plaque. There were no contraindications to aortic cannulation or cross-clamping. The patient was fully systemically heparinized and the ACT was maintained > 400 sec. The proximal aortic arch was cannulated with a 20 F aortic cannula for arterial inflow. Venous cannulation was performed via the right  atrial appendage using a two-staged venous cannula. An antegrade cardioplegia/vent cannula was inserted into the mid-ascending aorta. Aortic occlusion was performed  with a single cross-clamp. Systemic cooling to 32 degrees Centigrade and topical cooling of the heart with iced saline were used. Hyperkalemic antegrade cold blood cardioplegia was used to induce diastolic arrest and was then given at about 20 minute intervals throughout the period of arrest to maintain myocardial temperature at or below 10 degrees centigrade. A temperature probe was inserted into the interventricular septum and an insulating pad was placed in the pericardium.   Left internal mammary harvest:  The left side of the sternum was retracted using the Rultract retractor. The left internal mammary artery was harvested as a pedicle graft. All side branches were clipped. It was a medium-sized vessel of good quality with excellent blood flow. It was ligated distally and divided. It was sprayed with topical papaverine solution to prevent vasospasm.  Right internal mammary harvest:  The right side of the sternum was retracted using the Rultract retractor. The right internal mammary artery was harvested as a pedicle graft. All side branches were clipped. It was a medium-sized vessel of good quality with excellent blood flow. It was ligated distally and divided. It was sprayed with topical papaverine solution to prevent vasospasm.   Endoscopic vein harvest:  The right greater saphenous vein was harvested endoscopically through a 2 cm incision medial to the right knee. It was harvested from the upper thigh to below the knee. It was a medium-sized vein of good quality in the thigh but below the knee was too small. We need another section of vein to have enough length for three graft so the left greater saphenous vein was examined medial to the left knee but it was tiny and not suitable. Therefore I decided to harvest the right IMA. The side branches of the the harvested vein were all ligated with 4-0 silk ties.    Coronary arteries:  The coronary arteries were examined.   LAD:  Large vessel  with mild segmental distal disease. The diagonal was a medium sized vessel that was deep in the fat and had proximal disease but the mid portion was free of disease.  LCX:  The OM was a moderate sized vessel that had mild distal disease  RCA:  High takeoff of essentially a PDA branch that crossed over the acute margin to supply the inferior wall. This vessel had no disease. The main RCA beyond this branch was deep in the fat and was located but it was diffusely diseased as far distally as I could trace it. Since the PDA appeared to communicate with the distal PL branches well on cath I decided to put the RIMA to the high PDA ( or acute marginal).   Grafts:  1. LIMA to the LAD: 2.0 mm. It was sewn end to side using 8-0 prolene continuous suture. 2. RIMA to the PDA:  1.75  mm. It was sewn end to side using 8-0 prolene continuous suture. 3. SVG to the diagonal:  1.6 mm. It was sewn end to side using 7-0 prolene continuous suture. 4. SVG to OM:  1.6 mm. It was sewn end to side using 7-0 prolene continuous suture.  The proximal vein graft anastomoses were performed to the mid-ascending aorta using continuous 6-0 prolene suture. Graft markers were placed around the proximal anastomoses.   Completion:  The patient was rewarmed to 37 degrees Centigrade. The clamp was removed from the LIMA pedicle and there  was rapid warming of the septum and return of ventricular fibrillation. The crossclamp was removed with a time of 91 minutes. There was spontaneous return of sinus rhythm. The distal and proximal anastomoses were checked for hemostasis. The position of the grafts was satisfactory. Two temporary epicardial pacing wires were placed on the right atrium and two on the right ventricle. The patient was weaned from CPB without difficulty on no inotropes. CPB time was 120 minutes. Cardiac output was 4 LPM. Heparin was fully reversed with protamine and the aortic and venous cannulas removed. Hemostasis was  achieved. Mediastinal and left pleural drainage tubes were placed. The sternum was closed with  #6 stainless steel wires. The fascia was closed with continuous # 1 vicryl suture. The subcutaneous tissue was closed with 2-0 vicryl continuous suture. The skin was closed with 3-0 vicryl subcuticular suture. All sponge, needle, and instrument counts were reported correct at the end of the case. Dry sterile dressings were placed over the incisions and around the chest tubes which were connected to pleurevac suction. The patient was then transported to the surgical intensive care unit in critical but stable condition.

## 2016-04-06 ENCOUNTER — Inpatient Hospital Stay (HOSPITAL_COMMUNITY): Payer: Medicare HMO

## 2016-04-06 LAB — GLUCOSE, CAPILLARY
GLUCOSE-CAPILLARY: 107 mg/dL — AB (ref 65–99)
GLUCOSE-CAPILLARY: 118 mg/dL — AB (ref 65–99)
GLUCOSE-CAPILLARY: 103 mg/dL — AB (ref 65–99)
Glucose-Capillary: 121 mg/dL — ABNORMAL HIGH (ref 65–99)
Glucose-Capillary: 118 mg/dL — ABNORMAL HIGH (ref 65–99)
Glucose-Capillary: 106 mg/dL — ABNORMAL HIGH (ref 65–99)
Glucose-Capillary: 105 mg/dL — ABNORMAL HIGH (ref 65–99)
Glucose-Capillary: 124 mg/dL — ABNORMAL HIGH (ref 65–99)
Glucose-Capillary: 143 mg/dL — ABNORMAL HIGH (ref 65–99)

## 2016-04-06 LAB — BASIC METABOLIC PANEL
Anion gap: 7 (ref 5–15)
BUN: 9 mg/dL (ref 6–20)
CO2: 30 mmol/L (ref 22–32)
Calcium: 8.1 mg/dL — ABNORMAL LOW (ref 8.9–10.3)
Chloride: 101 mmol/L (ref 101–111)
Creatinine, Ser: 0.5 mg/dL (ref 0.44–1.00)
GFR calc Af Amer: >60 mL/min (ref >60)
GFR calc non Af Amer: >60 mL/min (ref >60)
Glucose, Bld: 120 mg/dL — ABNORMAL HIGH (ref 65–99)
Potassium: 3.6 mmol/L (ref 3.5–5.1)
Sodium: 138 mmol/L (ref 135–145)

## 2016-04-06 LAB — CBC
HCT: 23.5 % — ABNORMAL LOW (ref 36.0–46.0)
Hemoglobin: 7.5 g/dL — ABNORMAL LOW (ref 12.0–15.0)
MCH: 29.6 pg (ref 26.0–34.0)
MCHC: 31.9 g/dL (ref 30.0–36.0)
MCV: 92.9 fL (ref 78.0–100.0)
Platelets: 156 10*3/uL (ref 150–400)
RBC: 2.53 MIL/uL — ABNORMAL LOW (ref 3.87–5.11)
RDW: 14.7 % (ref 11.5–15.5)
WBC: 8.2 10*3/uL (ref 4.0–10.5)

## 2016-04-06 MED ORDER — INSULIN ASPART 100 UNIT/ML ~~LOC~~ SOLN
0.0000 [IU] | Freq: Three times a day (TID) | SUBCUTANEOUS | Status: DC
  Administered 2016-04-06 – 2016-04-07 (×3): 2 [IU] via SUBCUTANEOUS

## 2016-04-06 MED ORDER — SODIUM CHLORIDE 0.9% FLUSH: 3.0000 mL | INTRAVENOUS | Status: DC | PRN

## 2016-04-06 MED ORDER — POTASSIUM CHLORIDE 10 MEQ/50ML IV SOLN
10.0000 meq | INTRAVENOUS | Status: AC | PRN
  Administered 2016-04-06 (×3): 10 meq via INTRAVENOUS
  Filled 2016-04-06 (×3): qty 50

## 2016-04-06 MED ORDER — SODIUM CHLORIDE 0.9 % IV SOLN: 250.0000 mL | INTRAVENOUS | Status: DC | PRN

## 2016-04-06 MED ORDER — MAGNESIUM HYDROXIDE 400 MG/5ML PO SUSP
30.0000 mL | Freq: Every day | ORAL | Status: DC | PRN
  Administered 2016-04-09: 30 mL via ORAL
  Filled 2016-04-06: qty 30

## 2016-04-06 MED ORDER — MOVING RIGHT ALONG BOOK
Freq: Once | Status: AC
  Administered 2016-04-06: 10:00:00
  Filled 2016-04-06: qty 1

## 2016-04-06 MED ORDER — FUROSEMIDE 40 MG PO TABS
40.0000 mg | ORAL_TABLET | Freq: Every day | ORAL | Status: DC
  Administered 2016-04-07 – 2016-04-10 (×4): 40 mg via ORAL
  Filled 2016-04-06 (×4): qty 1

## 2016-04-06 MED ORDER — SODIUM CHLORIDE 0.9% FLUSH
3.0000 mL | Freq: Two times a day (BID) | INTRAVENOUS | Status: DC
  Administered 2016-04-06 – 2016-04-09 (×5): 3 mL via INTRAVENOUS

## 2016-04-06 MED ORDER — POTASSIUM CHLORIDE CRYS ER 20 MEQ PO TBCR
20.0000 meq | EXTENDED_RELEASE_TABLET | Freq: Every day | ORAL | Status: DC
  Administered 2016-04-06 – 2016-04-10 (×5): 20 meq via ORAL
  Filled 2016-04-06 (×5): qty 1

## 2016-04-06 MED ORDER — FUROSEMIDE 10 MG/ML IJ SOLN
40.0000 mg | Freq: Once | INTRAMUSCULAR | Status: AC
  Administered 2016-04-06: 40 mg via INTRAVENOUS
  Filled 2016-04-06: qty 4

## 2016-04-06 NOTE — Progress Notes (Signed)
Patient sitting up in chair, pain medication given. Prune juice given for constipation. No other needs at this time, call light within reach.

## 2016-04-06 NOTE — Progress Notes (Signed)
2 Days Post-Op Procedure(s) (LRB): CORONARY ARTERY BYPASS GRAFTING (CABG) x 4 using bilateral internal mammary arteries and right saphenous vein harvested by endovein (N/A) TRANSESOPHAGEAL ECHOCARDIOGRAM (TEE) (N/A) Subjective: Patient doing well after multi vessel CABG Sinus rhythm stable blood pressure Chest tube drainage has decreased-we'll remove all tubes Patient ambulating in hall nausea resolved We'll transfer to stepdown   Objective: Vital signs in last 24 hours: Temp:  [98 F (36.7 C)-99.1 F (37.3 C)] 98.5 F (36.9 C) (05/07 0750) Pulse Rate:  [78-91] 82 (05/07 1100) Cardiac Rhythm:  [-] Normal sinus rhythm (05/07 1000) Resp:  [0-21] 21 (05/07 1100) BP: (93-146)/(38-82) 108/38 mmHg (05/07 1100) SpO2:  [85 %-98 %] 97 % (05/07 1100) Weight:  [144 lb 2.9 oz (65.4 kg)] 144 lb 2.9 oz (65.4 kg) (05/07 0500)  Hemodynamic parameters for last 24 hours:   Stable  Intake/Output from previous day: 05/06 0701 - 05/07 0700 In: 703 [P.O.:120; I.V.:383; IV Piggyback:200] Out: 2165 [Urine:1845; Chest Tube:320] Intake/Output this shift: Total I/O In: 30 [I.V.:30] Out: -        Exam    General- alert and comfortable   Lungs- clear without rales, wheezes   Cor- regular rate and rhythm, no murmur , gallop   Abdomen- soft, non-tender   Extremities - warm, non-tender, minimal edema   Neuro- oriented, appropriate, no focal weakness   Lab Results:  Recent Labs  04/05/16 1645 04/05/16 1648 04/06/16 0401  WBC 9.9  --  8.2  HGB 8.2* 9.2* 7.5*  HCT 25.7* 27.0* 23.5*  PLT 174  --  156   BMET:  Recent Labs  04/05/16 0402  04/05/16 1648 04/06/16 0401  NA 142  --  139 138  K 4.1  --  3.9 3.6  CL 107  --  98* 101  CO2 26  --   --  30  GLUCOSE 112*  --  127* 120*  BUN 7  --  9 9  CREATININE 0.54  < > 0.50 0.50  CALCIUM 7.7*  --   --  8.1*  < > = values in this interval not displayed.  PT/INR:  Recent Labs  04/04/16 1501  LABPROT 18.3*  INR 1.52*   ABG     Component Value Date/Time   PHART 7.312* 04/04/2016 2230   HCO3 27.5* 04/04/2016 2230   TCO2 29 04/05/2016 1648   ACIDBASEDEF 1.0 04/04/2016 1812   O2SAT 97.0 04/04/2016 2230   CBG (last 3)   Recent Labs  04/05/16 2321 04/06/16 0357 04/06/16 0734  GLUCAP 105* 106* 105*    Assessment/Plan: S/P Procedure(s) (LRB): CORONARY ARTERY BYPASS GRAFTING (CABG) x 4 using bilateral internal mammary arteries and right saphenous vein harvested by endovein (N/A) TRANSESOPHAGEAL ECHOCARDIOGRAM (TEE) (N/A) Postoperative expected blood loss anemia, asymptomatic Continue with by mouth iron Transfer to stepdown   LOS: 2 days    Kathlee Nationseter Van Trigt III 04/06/2016

## 2016-04-07 ENCOUNTER — Encounter (HOSPITAL_COMMUNITY): Payer: Self-pay | Admitting: Surgery

## 2016-04-07 ENCOUNTER — Inpatient Hospital Stay (HOSPITAL_COMMUNITY): Payer: Medicare HMO

## 2016-04-07 LAB — BASIC METABOLIC PANEL
Anion gap: 9 (ref 5–15)
BUN: 11 mg/dL (ref 6–20)
CO2: 30 mmol/L (ref 22–32)
Calcium: 8.3 mg/dL — ABNORMAL LOW (ref 8.9–10.3)
Chloride: 99 mmol/L — ABNORMAL LOW (ref 101–111)
Creatinine, Ser: 0.5 mg/dL (ref 0.44–1.00)
GFR calc Af Amer: >60 mL/min (ref >60)
GFR calc non Af Amer: >60 mL/min (ref >60)
Glucose, Bld: 125 mg/dL — ABNORMAL HIGH (ref 65–99)
Potassium: 3.6 mmol/L (ref 3.5–5.1)
Sodium: 138 mmol/L (ref 135–145)

## 2016-04-07 LAB — GLUCOSE, CAPILLARY
GLUCOSE-CAPILLARY: 127 mg/dL — AB (ref 65–99)
GLUCOSE-CAPILLARY: 84 mg/dL (ref 65–99)
Glucose-Capillary: 104 mg/dL — ABNORMAL HIGH (ref 65–99)

## 2016-04-07 LAB — CBC
HCT: 22.7 % — ABNORMAL LOW (ref 36.0–46.0)
Hemoglobin: 7.2 g/dL — ABNORMAL LOW (ref 12.0–15.0)
MCH: 29.4 pg (ref 26.0–34.0)
MCHC: 31.7 g/dL (ref 30.0–36.0)
MCV: 92.7 fL (ref 78.0–100.0)
Platelets: 177 10*3/uL (ref 150–400)
RBC: 2.45 MIL/uL — ABNORMAL LOW (ref 3.87–5.11)
RDW: 14.6 % (ref 11.5–15.5)
WBC: 7.7 10*3/uL (ref 4.0–10.5)

## 2016-04-07 MED FILL — Potassium Chloride Inj 2 mEq/ML: INTRAVENOUS | Qty: 40 | Status: AC

## 2016-04-07 MED FILL — Dextrose Inj 5%: INTRAVENOUS | Qty: 250 | Status: AC

## 2016-04-07 MED FILL — Phenylephrine HCl Inj 10 MG/ML: INTRAMUSCULAR | Qty: 2 | Status: AC

## 2016-04-07 MED FILL — Heparin Sodium (Porcine) Inj 1000 Unit/ML: INTRAMUSCULAR | Qty: 30 | Status: AC

## 2016-04-07 MED FILL — Magnesium Sulfate Inj 50%: INTRAMUSCULAR | Qty: 10 | Status: AC

## 2016-04-07 NOTE — Care Management Important Message (Signed)
Important Message  Patient Details  Name: Nicole Dean MRN: [id number] Date of Birth: 1941/10/24   Medicare Important Message Given:  Yes    Darrold SpanWebster, Adrieanna Boteler Hall, RN 04/07/2016, 10:35 AM

## 2016-04-07 NOTE — Progress Notes (Signed)
EPW d/c'd per order and per protocol. Tips intact. VSS. Pt and family educated on need for vital signs q549m and bedrest for 1 hour. Call bell and phone within reach. Will continue to monitor.

## 2016-04-07 NOTE — Progress Notes (Signed)
CARDIAC REHAB PHASE I   PRE:  Rate/Rhythm: 93 SR  BP:  Sitting: 122/51        SaO2: 96 2L  MODE:  Ambulation: 300 ft   POST:  Rate/Rhythm: 103 ST  BP:  Sitting: 128/55         SaO2: 95 2L  Pt in bed, required some assistance from lying to sitting position, verbal cues for sternal precautions, minimal assistance to stand. Pt ambulated 300 ft on 2L O2, rolling walker, assist x1, fairly steady gait, tolerated fair. Pt c/o generalized weakness, fatigue, denies any other complaints, brief standing rest x1. Pt declined to sit up in recliner, states it is too uncomfortable. Pt required assist x2 to return to bed. Pt to bed per pt request after walk, call bell within reach. Encouraged IS, additional ambulation x2 today. Will follow.   [id number] Joylene GrapesEmily C Myking Sar, RN, BSN 04/07/2016 8:42 AM

## 2016-04-07 NOTE — Progress Notes (Addendum)
      [address]       [place] [id number]             [phone]      3 Days Post-Op Procedure(s) (LRB): CORONARY ARTERY BYPASS GRAFTING (CABG) x 4 using bilateral internal mammary arteries and right saphenous vein harvested by endovein (N/A) TRANSESOPHAGEAL ECHOCARDIOGRAM (TEE) (N/A)   Subjective:  Nicole Dean complains of some weakness.  Denies nausea, vomiting, and dyspnea.  Objective: Vital signs in last 24 hours: Temp:  [97.3 F (36.3 C)-98.7 F (37.1 C)] 98.7 F (37.1 C) (05/08 0630) Pulse Rate:  [81-93] 93 (05/07 1849) Cardiac Rhythm:  [-] Normal sinus rhythm (05/07 1900) Resp:  [0-21] 18 (05/08 0630) BP: (91-133)/(38-63) 106/53 mmHg (05/08 0630) SpO2:  [95 %-99 %] 97 % (05/08 0630) Weight:  [141 lb 6.4 oz (64.139 kg)] 141 lb 6.4 oz (64.139 kg) (05/08 0630)  Intake/Output from previous day: 05/07 0701 - 05/08 0700 In: 30 [I.V.:30] Out: 1500 [Urine:1500]  General appearance: alert, cooperative and no distress Heart: regular rate and rhythm Lungs: clear to auscultation bilaterally Abdomen: soft, non-tender; bowel sounds normal; no masses,  no organomegaly Extremities: edema trace, ecchymosis bilaterally Wound: clean and dry  Lab Results:  Recent Labs  04/06/16 0401 04/07/16 0345  WBC 8.2 7.7  HGB 7.5* 7.2*  HCT 23.5* 22.7*  PLT 156 177   BMET:  Recent Labs  04/06/16 0401 04/07/16 0345  NA 138 138  K 3.6 3.6  CL 101 99*  CO2 30 30  GLUCOSE 120* 125*  BUN 9 11  CREATININE 0.50 0.50  CALCIUM 8.1* 8.3*    PT/INR:  Recent Labs  04/04/16 1501  LABPROT 18.3*  INR 1.52*   ABG    Component Value Date/Time   PHART 7.312* 04/04/2016 2230   HCO3 27.5* 04/04/2016 2230   TCO2 29 04/05/2016 1648   ACIDBASEDEF 1.0 04/04/2016 1812   O2SAT 97.0 04/04/2016 2230   CBG (last 3)   Recent Labs  04/06/16 1727 04/06/16 2235 04/07/16 0644  GLUCAP 124* 143* 127*    Assessment/Plan: S/P Procedure(s) (LRB): CORONARY ARTERY BYPASS  GRAFTING (CABG) x 4 using bilateral internal mammary arteries and right saphenous vein harvested by endovein (N/A) TRANSESOPHAGEAL ECHOCARDIOGRAM (TEE) (N/A)  1. CV- hemodynamically stable, maintaining NSR- Lopressor 2. Pulm- wean oxygen as tolerated, continue IS 3. Renal- creatinine stable, mild hypervolemia 4. Expected post operative blood loss anemia- Hgb is low at 7.2, started on iron over the weekend... May benefit from blood transfusion 5. Dispo- Nicole Dean stable, maintaining NSR e/d EPW, may benefit from transfusion for low Hgb, continue current care   LOS: 3 days    Raford PitcherBARRETT, ERIN 04/07/2016   Chart reviewed, Nicole Dean examined, agree with above. Nicole Dean feels better this afternoon and vital signs are stable. I would not transfuse her. Continue iron.

## 2016-04-08 ENCOUNTER — Encounter (HOSPITAL_COMMUNITY): Payer: Self-pay | Admitting: Radiology

## 2016-04-08 ENCOUNTER — Inpatient Hospital Stay (HOSPITAL_COMMUNITY): Payer: Medicare HMO

## 2016-04-08 ENCOUNTER — Encounter (HOSPITAL_COMMUNITY): Payer: Self-pay | Admitting: Certified Registered Nurse Anesthetist

## 2016-04-08 LAB — CBC
HEMATOCRIT: 21 % — AB (ref 36.0–46.0)
HEMOGLOBIN: 6.6 g/dL — AB (ref 12.0–15.0)
MCH: 29.3 pg (ref 26.0–34.0)
MCHC: 31.4 g/dL (ref 30.0–36.0)
MCV: 93.3 fL (ref 78.0–100.0)
Platelets: 188 10*3/uL (ref 150–400)
RBC: 2.25 MIL/uL — AB (ref 3.87–5.11)
RDW: 14.9 % (ref 11.5–15.5)
WBC: 6.6 10*3/uL (ref 4.0–10.5)

## 2016-04-08 LAB — OCCULT BLOOD X 1 CARD TO LAB, STOOL: FECAL OCCULT BLD: NEGATIVE

## 2016-04-08 LAB — PREPARE RBC (CROSSMATCH)

## 2016-04-08 LAB — GLUCOSE, CAPILLARY: GLUCOSE-CAPILLARY: 116 mg/dL — AB (ref 65–99)

## 2016-04-08 MED ORDER — SODIUM CHLORIDE 0.9 % IV SOLN
Freq: Once | INTRAVENOUS | Status: AC
  Administered 2016-04-08: 06:00:00 via INTRAVENOUS

## 2016-04-08 MED ORDER — IOPAMIDOL (ISOVUE-370) INJECTION 76%
INTRAVENOUS | Status: AC
  Administered 2016-04-08: 100 mL
  Filled 2016-04-08: qty 100

## 2016-04-08 MED FILL — Sodium Bicarbonate IV Soln 8.4%: INTRAVENOUS | Qty: 50 | Status: AC

## 2016-04-08 MED FILL — Heparin Sodium (Porcine) Inj 1000 Unit/ML: INTRAMUSCULAR | Qty: 10 | Status: AC

## 2016-04-08 MED FILL — Sodium Chloride IV Soln 0.9%: INTRAVENOUS | Qty: 2000 | Status: AC

## 2016-04-08 MED FILL — Electrolyte-R (PH 7.4) Solution: INTRAVENOUS | Qty: 4000 | Status: AC

## 2016-04-08 MED FILL — Mannitol IV Soln 20%: INTRAVENOUS | Qty: 500 | Status: AC

## 2016-04-08 MED FILL — Heparin Sodium (Porcine) Inj 1000 Unit/ML: INTRAMUSCULAR | Qty: 30 | Status: AC

## 2016-04-08 MED FILL — Lidocaine HCl IV Inj 20 MG/ML: INTRAVENOUS | Qty: 5 | Status: AC

## 2016-04-08 NOTE — Progress Notes (Signed)
Patient ID: Nicole GivensJanice D Dean, female   DOB: 1941-11-09, 75 y.o.   MRN: [id number]  CT Surgery  Her CTA of the chest, abdomen and pelvis looks ok. The AAA is unchanged at 6 cm and no sign of leak. There is trivial fluid in the anterior mediastinum after CABG. The are small bilateral pleural effusions. No sign of any blood loss.  She feels better this evening. Vitals are stable. Will continue to observe. Repeat labs in the am.

## 2016-04-08 NOTE — Progress Notes (Signed)
1330 Talked with RN . Will hold ambulation as pt to get CT Scan soon . Will follow up tomorrow. Luetta Nuttingharlene Neftaly Swiss RN BSN 04/08/2016 1:32 PM

## 2016-04-08 NOTE — Progress Notes (Signed)
CRITICAL VALUE ALERT  Critical value received: Hb 6.6  Date of notification:  04/08/2016  Time of notification:  04:05  Critical value read back:yes  Nurse who received alert:  Gerad Cornelio  MD notified (1st page):  yes  Time of first page: 04:10  MD notified (2nd page):  Time of second page:  Responding MD:  Dr. Laneta SimmersBartle  Time MD responded:  04:12

## 2016-04-08 NOTE — Progress Notes (Addendum)
Contacted by nurse stating Ms. Nicole Dean has progressively felt worse and weak as the day has gone on.  She has received 1 of 2 units Packed cells for Hemoglobin of 6.6 this morning.  She has moved her bowels and Hemacult card was negative.    PE:    Gen: no apparent distress, pale Heart: RRR Lungs; CTA Abd: soft, non distended, tender to palpation Ext; ecchymosis bilaterally, no significant  A/P:  Patient has a known Abdominal Aneurysm.  This needs repaired, however she was found to have CAD on Cardiac clearance.  Will get CTA chest/abdomen/pelvis to rule out acute rupture.  Continue to monitor stools for blood loss.    [name] PA-C   Chart reviewed, patient examined, agree with above.

## 2016-04-08 NOTE — Progress Notes (Addendum)
[address]       [place] [address]             [phone]      4 Days Post-Op Procedure(s) (LRB): CORONARY ARTERY BYPASS GRAFTING (CABG) x 4 using bilateral internal mammary arteries and right saphenous vein harvested by endovein (N/A) TRANSESOPHAGEAL ECHOCARDIOGRAM (TEE) (N/A)   Subjective:  Ms. Nicole Dean complains of weakness this morning.  She denies abd pain, nausea, vomiting, or blood in her stools  Objective: Vital signs in last 24 hours: Temp:  [98.6 F (37 C)-98.7 F (37.1 C)] 98.6 F (37 C) (05/09 0615) Pulse Rate:  [82-99] 87 (05/09 0615) Cardiac Rhythm:  [-] Normal sinus rhythm (05/08 1900) Resp:  [18] 18 (05/09 0508) BP: (100-121)/(41-56) 121/53 mmHg (05/09 0615) SpO2:  [96 %-99 %] 99 % (05/09 0615) Weight:  [140 lb 14.4 oz (63.912 kg)] 140 lb 14.4 oz (63.912 kg) (05/09 0317)  Intake/Output from previous day: 05/08 0701 - 05/09 0700 In: 720 [P.O.:720] Out: 200 [Urine:200]  General appearance: alert, cooperative and no distress Heart: regular rate and rhythm Lungs: clear to auscultation bilaterally Abdomen: soft, non-tender; bowel sounds normal; no masses,  no organomegaly Extremities: edema trace, ecchymosis bilaterally, no evidence of hematoma in groin  Wound: clean and dry  Lab Results:  Recent Labs  04/07/16 0345 04/08/16 0258  WBC 7.7 6.6  HGB 7.2* 6.6*  HCT 22.7* 21.0*  PLT 177 188   BMET:  Recent Labs  04/06/16 0401 04/07/16 0345  NA 138 138  K 3.6 3.6  CL 101 99*  CO2 30 30  GLUCOSE 120* 125*  BUN 9 11  CREATININE 0.50 0.50  CALCIUM 8.1* 8.3*    PT/INR: No results for input(s): LABPROT, INR in the last 72 hours. ABG    Component Value Date/Time   PHART 7.312* 04/04/2016 2230   HCO3 27.5* 04/04/2016 2230   TCO2 29 04/05/2016 1648   ACIDBASEDEF 1.0 04/04/2016 1812   O2SAT 97.0 04/04/2016 2230   CBG (last 3)   Recent Labs  04/07/16 1651 04/07/16 2136 04/08/16 0559  GLUCAP 84 104* 116*     Assessment/Plan: S/P Procedure(s) (LRB): CORONARY ARTERY BYPASS GRAFTING (CABG) x 4 using bilateral internal mammary arteries and right saphenous vein harvested by endovein (N/A) TRANSESOPHAGEAL ECHOCARDIOGRAM (TEE) (N/A)  1. CV- hemodynamically stable, EPW out, maintaining NSR continue Lopressor 2. Pulm- wean oxygen as tolerated, continue IS 3. Renal- creatinine WNL, minimal edema- on Lasix, will give a dose in between blood units then discontinue 4. Expected post operative blood loss anemia- Hgb is down to 6.6, ? Source 2 units ordered for transfusion, will check stool for occult blood, monitor 5. Dispo- patient getting blood transfusion this morning, monitor stools for blood loss, continue current care   LOS: 4 days    Nicole Dean 04/08/2016   Chart reviewed, patient examined, agree with above. Her Hgb has been 7.5 to 7.2 the last few days and she felt better yesterday afternoon than in the morning. This am her Hgb was down to 6.6 so I gave her two units of PRBC's. She had a stool this am that was heme negative. The blood is still running but she doesn't feel as well this afternoon as she thinks she should. She reports some abdominal pain that is RLQ but has active BS and does not appear tender. With a 6 cm AAA and unexplained anemia I think it would be best to get a CTA of the  chest, abdomen and pelvis to assess the aneurysm but also rule out pericardial or pleural effusion. It may just be that she is not making blood cells yet and Hgb is dropping due to the normal red cell death. Discussed plans for CT with her and her daughter.

## 2016-04-09 ENCOUNTER — Encounter: Payer: Medicare HMO | Admitting: Surgery

## 2016-04-09 LAB — CBC
HEMATOCRIT: 27.8 % — AB (ref 36.0–46.0)
HEMOGLOBIN: 9 g/dL — AB (ref 12.0–15.0)
MCH: 28.8 pg (ref 26.0–34.0)
MCHC: 32.4 g/dL (ref 30.0–36.0)
MCV: 88.8 fL (ref 78.0–100.0)
Platelets: 217 10*3/uL (ref 150–400)
RBC: 3.13 MIL/uL — ABNORMAL LOW (ref 3.87–5.11)
RDW: 17 % — AB (ref 11.5–15.5)
WBC: 6.6 10*3/uL (ref 4.0–10.5)

## 2016-04-09 LAB — TYPE AND SCREEN
ABO/RH(D): A POS
Antibody Screen: NEGATIVE
UNIT DIVISION: 0
UNIT DIVISION: 0

## 2016-04-09 LAB — BASIC METABOLIC PANEL
ANION GAP: 10 (ref 5–15)
BUN: 8 mg/dL (ref 6–20)
CHLORIDE: 102 mmol/L (ref 101–111)
CO2: 30 mmol/L (ref 22–32)
Calcium: 8.6 mg/dL — ABNORMAL LOW (ref 8.9–10.3)
Creatinine, Ser: 0.49 mg/dL (ref 0.44–1.00)
GFR calc Af Amer: >60 mL/min (ref >60)
GLUCOSE: 114 mg/dL — AB (ref 65–99)
POTASSIUM: 3.8 mmol/L (ref 3.5–5.1)
Sodium: 142 mmol/L (ref 135–145)

## 2016-04-09 LAB — OCCULT BLOOD X 1 CARD TO LAB, STOOL: Fecal Occult Bld: NEGATIVE

## 2016-04-09 MED ORDER — CALCIUM CARBONATE ANTACID 500 MG PO CHEW
1.0000 | CHEWABLE_TABLET | ORAL | Status: DC | PRN
  Administered 2016-04-09: 200 mg via ORAL
  Filled 2016-04-09: qty 1

## 2016-04-09 NOTE — Discharge Instructions (Signed)
Coronary Artery Bypass Grafting, Care After °Refer to this sheet in the next few weeks. These instructions provide you with information on caring for yourself after your procedure. Your health care provider may also give you more specific instructions. Your treatment has been planned according to current medical practices, but problems sometimes occur. Call your health care provider if you have any problems or questions after your procedure. °WHAT TO EXPECT AFTER THE PROCEDURE °Recovery from surgery will be different for everyone. Some people feel well after 3 or 4 weeks, while for others it takes longer. After your procedure, it is typical to have the following: °· Nausea and a lack of appetite.   °· Constipation. °· Weakness and fatigue.   °· Depression or irritability.   °· Pain or discomfort at your incision site. °HOME CARE INSTRUCTIONS °· Take medicines only as directed by your health care provider. Do not stop taking medicines or start any new medicines without first checking with your health care provider. °· Take your pulse as directed by your health care provider. °· Perform deep breathing as directed by your health care provider. If you were given a device called an incentive spirometer, use it to practice deep breathing several times a day. Support your chest with a pillow or your arms when you take deep breaths or cough. °· Keep incision areas clean, dry, and protected. Remove or change any bandages (dressings) only as directed by your health care provider. You may have skin adhesive strips over the incision areas. Do not take the strips off. They will fall off on their own. °· Check incision areas daily for any swelling, redness, or drainage. °· If incisions were made in your legs, do the following: °¨ Avoid crossing your legs.   °¨ Avoid sitting for long periods of time. Change positions every 30 minutes.   °¨ Elevate your legs when you are sitting. °· Wear compression stockings as directed by your  health care provider. These stockings help keep blood clots from forming in your legs. °· Take showers once your health care provider approves. Until then, only take sponge baths. Pat incisions dry. Do not rub incisions with a washcloth or towel. Do not take baths, swim, or use a hot tub until your health care provider approves. °· Eat foods that are high in fiber, such as raw fruits and vegetables, whole grains, beans, and nuts. Meats should be lean cut. Avoid canned, processed, and fried foods. °· Drink enough fluid to keep your urine clear or pale yellow. °· Weigh yourself every day. This helps identify if you are retaining fluid that may make your heart and lungs work harder. °· Rest and limit activity as directed by your health care provider. You may be instructed to: °¨ Stop any activity at once if you have chest pain, shortness of breath, irregular heartbeats, or dizziness. Get help right away if you have any of these symptoms. °¨ Move around frequently for short periods or take short walks as directed by your health care provider. Increase your activities gradually. You may need physical therapy or cardiac rehabilitation to help strengthen your muscles and build your endurance. °¨ Avoid lifting, pushing, or pulling anything heavier than 10 lb (4.5 kg) for at least 6 weeks after surgery. °· Do not drive until your health care provider approves.  °· Ask your health care provider when you may return to work. °· Ask your health care provider when you may resume sexual activity. °· Keep all follow-up visits as directed by your health care   provider. This is important. °SEEK MEDICAL CARE IF: °· You have swelling, redness, increasing pain, or drainage at the site of an incision. °· You have a fever. °· You have swelling in your ankles or legs. °· You have pain in your legs.   °· You gain 2 or more pounds (0.9 kg) a day. °· You are nauseous or vomit. °· You have diarrhea.  °SEEK IMMEDIATE MEDICAL CARE IF: °· You have  chest pain that goes to your jaw or arms. °· You have shortness of breath.   °· You have a fast or irregular heartbeat.   °· You notice a "clicking" in your breastbone (sternum) when you move.   °· You have numbness or weakness in your arms or legs. °· You feel dizzy or light-headed.   °MAKE SURE YOU: °· Understand these instructions. °· Will watch your condition. °· Will get help right away if you are not doing well or get worse. °  °This information is not intended to replace advice given to you by your health care provider. Make sure you discuss any questions you have with your health care provider. °  °Document Released: 06/06/2005 Document Revised: 12/08/2014 Document Reviewed: 04/26/2013 °Elsevier Interactive Patient Education ©2016 [employer] ° °Endoscopic Saphenous Vein Harvesting, Care After °Refer to this sheet in the next few weeks. These instructions provide you with information on caring for yourself after your procedure. Your health care provider may also give you more specific instructions. Your treatment has been planned according to current medical practices, but problems sometimes occur. Call your health care provider if you have any problems or questions after your procedure. °HOME CARE INSTRUCTIONS °Medicine °· Take whatever pain medicine your surgeon prescribes. Follow the directions carefully. Do not take over-the-counter pain medicine unless your surgeon says it is okay. Some pain medicine can cause bleeding problems for several weeks after surgery. °· Follow your surgeon's instructions about driving. You will probably not be permitted to drive after heart surgery. °· Take any medicines your surgeon prescribes. Any medicines you took before your heart surgery should be checked with your health care provider before you start taking them again. °Wound care °· If your surgeon has prescribed an elastic bandage or stocking, ask how long you should wear it. °· Check the area around your surgical  cuts (incisions) whenever your bandages (dressings) are changed. Look for any redness or swelling. °· You will need to return to have the stitches (sutures) or staples taken out. Ask your surgeon when to do that. °· Ask your surgeon when you can shower or bathe. °Activity °· Try to keep your legs raised when you are sitting. °· Do any exercises your health care providers have given you. These may include deep breathing exercises, coughing, walking, or other exercises. °SEEK MEDICAL CARE IF: °· You have any questions about your medicines. °· You have more leg pain, especially if your pain medicine stops working. °· New or growing bruises develop on your leg. °· Your leg swells, feels tight, or becomes red. °· You have numbness in your leg. °SEEK IMMEDIATE MEDICAL CARE IF: °· Your pain gets much worse. °· Blood or fluid leaks from any of the incisions. °· Your incisions become warm, swollen, or red. °· You have chest pain. °· You have trouble breathing. °· You have a fever. °· You have more pain near your leg incision. °MAKE SURE YOU: °· Understand these instructions. °· Will watch your condition. °· Will get help right away if you are not doing well or   get worse. °  °This information is not intended to replace advice given to you by your health care provider. Make sure you discuss any questions you have with your health care provider. °  °Document Released: 07/30/2011 Document Revised: 12/08/2014 Document Reviewed: 07/30/2011 °Elsevier Interactive Patient Education ©2016 Elsevier Inc. ° ° °

## 2016-04-09 NOTE — Discharge Summary (Signed)
Physician Discharge Summary  Patient ID: Nicole Dean MRN: [id number] DOB/AGE: Oct 04, 1941 75 y.o.  Admit date: 04/04/2016 Discharge date: 04/10/2016  Admission Diagnoses:  Patient Active Problem List   Diagnosis Date Noted  . Abnormal nuclear stress test 03/31/2016  . AAA (abdominal aortic aneurysm) (HCC) 03/25/2016  . Atherosclerotic peripheral vascular disease (HCC) 03/03/2016  . Depression with anxiety 08/09/2012  . DEGENERATIVE JOINT DISEASE 01/30/2010  . OSTEOPENIA 12/08/2007  . INSOMNIA, CHRONIC 08/30/2007  . DISORDER, DEPRESSIVE NEC 08/30/2007  . GERD 08/30/2007  . DIVERTICULAR DISEASE 08/30/2007  . HYPERLIPIDEMIA, MIXED 03/09/2007  . HYPERTENSION, BENIGN ESSENTIAL 03/09/2007   Discharge Diagnoses:   Patient Active Problem List   Diagnosis Date Noted  . 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 Abnormal nuclear stress test 03/31/2016  . AAA (abdominal aortic aneurysm) (HCC) 03/25/2016  . Atherosclerotic peripheral vascular disease (HCC) 03/03/2016  . Depression with anxiety 08/09/2012  . DEGENERATIVE JOINT DISEASE 01/30/2010  . OSTEOPENIA 12/08/2007  . INSOMNIA, CHRONIC 08/30/2007  . DISORDER, DEPRESSIVE NEC 08/30/2007  . GERD 08/30/2007  . DIVERTICULAR DISEASE 08/30/2007  . HYPERLIPIDEMIA, MIXED 03/09/2007  . HYPERTENSION, BENIGN ESSENTIAL 03/09/2007   Discharged Condition: good  History of Present Illness:  Nicole Dean is a 75 yo white female with known history of HTN, Hyperlipidemia, and AAA.  She recently underwent repeat CT scan for her AAA which showed enlargement from previous scan.  She was evaluated by Dr. [name] who recommended open repair.  The patient underwent workup via Cardiology for surgical clearance.  However, the patient was found to have a positive stress test which ultimately led to cardiac catheterization.  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  It was felt she would require coronary bypass grafting prior to proceeding with repair of her AAA.   She was referred to [facility] and evaluated by Dr. Laneta Simmers at which time the patient admitted to a 6 month history of substernal chest pain and shortness of breath on exertion.  She also was experiencing fatigue and leg weakness with ambulation.  Dr. Laneta Simmers felt the patient should undergo coronary bypass grafting, the risks and benefits of the procedure were explained to the patient and she was agreeable to proceed.  Hospital Course:   Nicole Dean presented to [facility] on 04/04/2016.  She was taken to the operating room and underwent CABG x 4 utilizing LIMA to LAD, RIMA to PDA, SVG to Diagonal, and SVG to OM.  She also underwent endoscopic harvest of the greater saphenous vein from her right leg.  Her left leg was explored but no adequate conduit was found.  She tolerated the procedure without difficulty and was taken to the SICU in stable condition.  The patient was extubated the evening of surgery.  During her stay in the SICU the patients chest tubes and arterial lines were removed without difficulty.  She had post operative anemia and was started iron.  She was maintaining NSR and transferred to the stepdown unit in stable condition.  Her hemoglobin level continued to decrease getting as low as 6.6.  She was transfused 2 units of packed cells.  She continued to feel poorly and complained of abdominal pain.  Repeat CTA of chest, abdomen, and pelvis was obtained and showed no change in her AAA.  Her stool was also checked and negative for blood.  Her hemoglobin level was monitored and remained stable after transfusion.  Most recent level is 9.9.  She has maintained NSR and her pacing  wires have been removed without difficulty.  She has been weaned off oxygen.  She is ambulating without much difficulty.  She is tolerating a heart healthy diet.  She is felt medically stable for discharge home today.         Significant Diagnostic Studies: angiography:   1. Ost LM to LM lesion, 70% stenosed. 2. Prox LAD to  Mid LAD lesion, 90% stenosed. 3. Mid LAD lesion, 90% stenosed. 4. Ost Cx lesion, 80% stenosed. 5. Prox RCA to Mid RCA lesion, 100% stenosed. 6. The left ventricular systolic function is normal.  Treatments: surgery:   7. Median Sternotomy 8. Extracorporeal circulation 3. Coronary artery bypass grafting x 4   Left internal mammary graft to the LAD  Right internal mammary graft to the PDA  SVG to diagonal  SVG to OM   4. Endoscopic vein harvest from the right leg  Disposition: 01-Home or Self Care   Discharge Medications:  The patient has been discharged on:   1.Beta Blocker:  Yes [  x ]                              No   [   ]                              If No, reason:  2.Ace Inhibitor/ARB: Yes [ x  ]                                     No  [    ]                                     If No, reason:  3.Statin:   Yes [x   ]                  No  [   ]                  If No, reason:  4.Ecasa:  Yes  [ x  ]                  No   [   ]                  If No, reason:     Medication List    TAKE these medications        aspirin 325 MG EC tablet  Take 1 tablet (325 mg total) by mouth daily.     atorvastatin 40 MG tablet  Commonly known as:  LIPITOR  Take 1 tablet (40 mg total) by mouth daily.     busPIRone 15 MG tablet  Commonly known as:  BUSPAR  Take 15 mg by mouth 2 (two) times daily. 15 mg every morning and 30 mg every evening     busPIRone 30 MG tablet  Commonly known as:  BUSPAR  Take 30 mg by mouth 2 (two) times daily. Reported on 04/01/2016     carvedilol 6.25 MG tablet  Commonly known as:  COREG  Take 1 tablet (6.25 mg total) by mouth 2 (two) times daily.     enalapril 10 MG tablet  Commonly known as:  VASOTEC  Take 1 tablet (10 mg total) by mouth daily.     ferrous fumarate-b12-vitamic C-folic acid capsule  Commonly known as:  TRINSICON / FOLTRIN  Take 1 capsule by mouth every morning.     Fish Oil 1200 MG Caps  Take 1,200 mg by  mouth 2 (two) times daily.     hydrochlorothiazide 25 MG tablet  Commonly known as:  HYDRODIURIL  Take 25 mg by mouth daily.     MULTIVITAMIN/IRON PO  Take 1 tablet by mouth daily.     omeprazole 40 MG capsule  Commonly known as:  PRILOSEC  Take 40 mg by mouth at bedtime.     traMADol 50 MG tablet  Commonly known as:  ULTRAM  Take 1-2 tablets (50-100 mg total) by mouth every 4 (four) hours as needed for moderate pain.     traZODone 100 MG tablet  Commonly known as:  DESYREL  Take 100 mg by mouth at bedtime.     ZOLOFT 100 MG tablet  Generic drug:  sertraline  Take 100 mg by mouth daily with breakfast.       Follow-up Information    Follow up with Alleen Borne, MD On 05/07/2016.   Specialty:  Cardiothoracic Surgery   Why:  Appointment is at 1:00   Contact information:   [address] [employer] [address] [place] [address] [phone]       Follow up with [facility] On 05/07/2016.   Why:  Please get CXR at 12:30   Contact information:   [facility]       Follow up with Janetta Hora, PA-C On 04/29/2016.   Specialties:  Cardiology, Radiology   Why:  Appointment is at 9:30   Contact information:   [address] [place] [address] [phone]       Follow up with [facility].   Why:  RW arranged - to be delivered to room prior to discharge   Contact information:   [address] [place] [address] [phone]       Follow up with [facility] In 2 weeks.   Specialty:  Family Medicine   Why:  Follow up to get blood count checked   Contact information:   [address] [place] [address] [phone]       Signed: Lowella Dandy 04/10/2016, 7:56 AM

## 2016-04-09 NOTE — Care Management Note (Signed)
Case Management Note Donn PieriniKristi Jorey Dollard RN, BSN Unit 2W-Case Manager [phone]  Patient Details  Name: Nicole GivensJanice D Dean MRN: [id number] Date of Birth: 1941-03-20  Subjective/Objective:     Pt admitted s/p CABG               Action/Plan: PTA pt lived at home- plan to return home with family assistance- referral for DME and HH needs[facility] in to speak with pt at bedside- [facility]- per pt she would like to review agencies with daughter- CM to f/u in am prior to d/c regarding choice- spoke with Jermaine with [facility]- RW to be delivered to room prior to discharge.   Expected Discharge Date:                  Expected Discharge Plan:  Home w Home Health Services  In-House Referral:     Discharge planning Services  CM Consult  Post Acute Care Choice:  Durable Medical Equipment, Home Health Choice offered to:  Patient  DME Arranged:  Walker rolling DME Agency:  [facility]  [facility]     Status of Service:  In process, will continue to follow  Medicare Important Message Given:  Yes Date Medicare IM Given:    Medicare IM give by:    Date Additional Medicare IM Given:    Additional Medicare Important Message give by:     If discussed at Long Length of Stay Meetings, dates discussed:    Additional Comments:  Darrold SpanWebster, Nilani Hugill Hall, RN 04/09/2016, 3:14 PM

## 2016-04-09 NOTE — Progress Notes (Signed)
CARDIAC REHAB PHASE I   PRE:  Rate/Rhythm: 85 SR    BP: sitting 133/61    SaO2: 97 2L, 97 RA  MODE:  Ambulation: 450 ft   POST:  Rate/Rhythm: 102 ST    BP: sitting 168/69     SaO2: 92 RA  Pt feels well today. Able to stand with min assist and use RW. Steady, no rest or c/o. Did well without O2, left off in room. Will need RW for home use.  [id number]   Nicole MassonRandi Kristan Bethanie Dean CES, ACSM 04/09/2016 11:20 AM

## 2016-04-09 NOTE — Progress Notes (Addendum)
      [address]       [place] [id number]             [phone]      5 Days Post-Op Procedure(s) (LRB): CORONARY ARTERY BYPASS GRAFTING (CABG) x 4 using bilateral internal mammary arteries and right saphenous vein harvested by endovein (N/A) TRANSESOPHAGEAL ECHOCARDIOGRAM (TEE) (N/A)   Subjective:  Ms. Kevan NyGates states she is feeling much better.  She denies further abdominal pain, no nausea or vomiting.  Objective: Vital signs in last 24 hours: Temp:  [98.1 F (36.7 C)-99.7 F (37.6 C)] 99.7 F (37.6 C) (05/10 0500) Pulse Rate:  [83-89] 89 (05/10 0500) Cardiac Rhythm:  [-] Normal sinus rhythm (05/09 1902) Resp:  [16-18] 16 (05/10 0500) BP: (108-131)/(55-81) 130/55 mmHg (05/10 0500) SpO2:  [96 %-99 %] 96 % (05/09 2106) Weight:  [141 lb 3.2 oz (64.048 kg)] 141 lb 3.2 oz (64.048 kg) (05/10 0500)  Intake/Output from previous day: 05/09 0701 - 05/10 0700 In: 1110 [P.O.:480; Blood:630] Out: -   General appearance: alert, cooperative and no distress Heart: regular rate and rhythm Lungs: clear to auscultation bilaterally Abdomen: soft, non-tender; bowel sounds normal; no masses,  no organomegaly Extremities: edema trace, ecchymosis bilaterally Wound: clean and dry  Lab Results:  Recent Labs  04/08/16 0258 04/09/16 0323  WBC 6.6 6.6  HGB 6.6* 9.0*  HCT 21.0* 27.8*  PLT 188 217   BMET:  Recent Labs  04/07/16 0345 04/09/16 0323  NA 138 142  K 3.6 3.8  CL 99* 102  CO2 30 30  GLUCOSE 125* 114*  BUN 11 8  CREATININE 0.50 0.49  CALCIUM 8.3* 8.6*    PT/INR: No results for input(s): LABPROT, INR in the last 72 hours. ABG    Component Value Date/Time   PHART 7.312* 04/04/2016 2230   HCO3 27.5* 04/04/2016 2230   TCO2 29 04/05/2016 1648   ACIDBASEDEF 1.0 04/04/2016 1812   O2SAT 97.0 04/04/2016 2230   CBG (last 3)   Recent Labs  04/07/16 1651 04/07/16 2136 04/08/16 0559  GLUCAP 84 104* 116*    Assessment/Plan: S/P Procedure(s)  (LRB): CORONARY ARTERY BYPASS GRAFTING (CABG) x 4 using bilateral internal mammary arteries and right saphenous vein harvested by endovein (N/A) TRANSESOPHAGEAL ECHOCARDIOGRAM (TEE) (N/A)  1. CV- hemodynamically stable, maintaining NSR, continue Lopressor 2. Pulm- wean oxygen as tolerated, continue IS 3. Renal- creatinine has been okay, weight is up about 2 lbs since admission, continue Lasix 4. Expected post operative blood loss anemia- Hgb up to 9.0 after transfusion, CTA R/O rupture of AAA, Hemacult card was negative, no acute signs of bleeding present 5. Dispo- patient feeling much better today, continue to wean oxygen, ambulate... If Hgb remains stable and gets off oxygen, possibly ready for discharge home tomorrow    LOS: 5 days    Lowella DandyBARRETT, ERIN 04/09/2016   Chart reviewed, patient examined, agree with above.

## 2016-04-10 LAB — CBC
HEMATOCRIT: 30.8 % — AB (ref 36.0–46.0)
Hemoglobin: 9.9 g/dL — ABNORMAL LOW (ref 12.0–15.0)
MCH: 28.6 pg (ref 26.0–34.0)
MCHC: 32.1 g/dL (ref 30.0–36.0)
MCV: 89 fL (ref 78.0–100.0)
PLATELETS: 261 10*3/uL (ref 150–400)
RBC: 3.46 MIL/uL — ABNORMAL LOW (ref 3.87–5.11)
RDW: 16.3 % — ABNORMAL HIGH (ref 11.5–15.5)
WBC: 7 10*3/uL (ref 4.0–10.5)

## 2016-04-10 MED ORDER — ENALAPRIL MALEATE 10 MG PO TABS
10.0000 mg | ORAL_TABLET | Freq: Every day | ORAL | Status: DC
Start: 2016-04-10 — End: 2016-04-10
  Administered 2016-04-10: 10 mg via ORAL
  Filled 2016-04-10: qty 1

## 2016-04-10 MED ORDER — FE FUMARATE-B12-VIT C-FA-IFC PO CAPS: 1.0000 | ORAL_CAPSULE | Freq: Every morning | ORAL | Status: DC

## 2016-04-10 MED ORDER — TRAMADOL HCL 50 MG PO TABS: 50.0000 mg | ORAL_TABLET | ORAL | Status: DC | PRN

## 2016-04-10 MED ORDER — ASPIRIN 325 MG PO TBEC: 325.0000 mg | DELAYED_RELEASE_TABLET | Freq: Every day | ORAL | Status: DC

## 2016-04-10 NOTE — Progress Notes (Addendum)
      [address]       [place], [id number]             [phone]      6 Days Post-Op Procedure(s) (LRB): CORONARY ARTERY BYPASS GRAFTING (CABG) x 4 using bilateral internal mammary arteries and right saphenous vein harvested by endovein (N/A) TRANSESOPHAGEAL ECHOCARDIOGRAM (TEE) (N/A)   Subjective:  Ms. Nicole Dean continues to feel much better.  She wants to go home today.  Objective: Vital signs in last 24 hours: Temp:  [98.6 F (37 C)-99.6 F (37.6 C)] 99.6 F (37.6 C) (05/11 0124) Pulse Rate:  [88-96] 88 (05/11 0124) Cardiac Rhythm:  [-] Normal sinus rhythm (05/10 2015) Resp:  [18] 18 (05/11 0124) BP: (121-144)/(55-78) 131/56 mmHg (05/11 0124) SpO2:  [86 %-95 %] 86 % (05/11 0124) Weight:  [138 lb 8 oz (62.823 kg)] 138 lb 8 oz (62.823 kg) (05/11 0500)  Intake/Output from previous day: 05/10 0701 - 05/11 0700 In: 600 [P.O.:600] Out: -   General appearance: alert, cooperative and no distress Heart: regular rate and rhythm Lungs: clear to auscultation bilaterally Abdomen: soft, non-tender; bowel sounds normal; no masses,  no organomegaly Extremities: edema trace, ecchymosis Wound: clean and dry  Lab Results:  Recent Labs  04/09/16 0323 04/10/16 0430  WBC 6.6 7.0  HGB 9.0* 9.9*  HCT 27.8* 30.8*  PLT 217 261   BMET:  Recent Labs  04/09/16 0323  NA 142  K 3.8  CL 102  CO2 30  GLUCOSE 114*  BUN 8  CREATININE 0.49  CALCIUM 8.6*    PT/INR: No results for input(s): LABPROT, INR in the last 72 hours. ABG    Component Value Date/Time   PHART 7.312* 04/04/2016 2230   HCO3 27.5* 04/04/2016 2230   TCO2 29 04/05/2016 1648   ACIDBASEDEF 1.0 04/04/2016 1812   O2SAT 97.0 04/04/2016 2230   CBG (last 3)   Recent Labs  04/07/16 1651 04/07/16 2136 04/08/16 0559  GLUCAP 84 104* 116*    Assessment/Plan: S/P Procedure(s) (LRB): CORONARY ARTERY BYPASS GRAFTING (CABG) x 4 using bilateral internal mammary arteries and right saphenous vein  harvested by endovein (N/A) TRANSESOPHAGEAL ECHOCARDIOGRAM (TEE) (N/A)  1. CV- hemodynamically stable, maintaining NSR, mild HTN- will start low dose ACE 2. Pulm- no acute issues, off oxygen, continue IS 3. Renal- creatinine has been stable, weight continues to improve, will taper Lasix 4. Expected Post Operative Anemia- Hgb stable up to 9.9 5. Dispo- patient stable, Hgb at 9.9, will add low dose ACE for BP... Low grade temp, likely atelectasis, no acute signs of infection.... Wounds healing well, no urinary symptoms, no leukocytosis.. She is medically stable for discharge home today   LOS: 6 days    Raford PitcherBARRETT, ERIN 04/10/2016   Chart reviewed, patient examined, agree with above. She looks good and feels much better. I agree with sending home today.

## 2016-04-10 NOTE — Progress Notes (Signed)
CARDIAC REHAB PHASE I   PRE:  Rate/Rhythm: 95 SR  BP:  Sitting: 131/75        SaO2: 93 RA  MODE:  Ambulation: 690 ft   POST:  Rate/Rhythm: 105 ST  BP:  Sitting: 147/72         SaO2: 97 RA  Pt ambulated 690 ft on RA, rolling walker, standby assist, steady gait, tolerated well, no complaints. Cardiac surgery discharge education completed. Reviewed IS, sternal precautions, activity progression, exercise guidelines, heart healthy diet,  sodium restrictions, daily weights and phase 2 cardiac rehab. Pt verbalized understanding. Pt agrees to phase 2 cardiac rehab referral, [facility]. Pt to recliner after walk, call bell within reach. Pt eager for discharge.   [phone] Joylene GrapesEmily C Lincon Sahlin, RN, BSN 04/10/2016 10:33 AM

## 2016-04-10 NOTE — Care Management Note (Signed)
Case Management Note Donn PieriniKristi Aima Mcwhirt RN, BSN Unit 2W-Case Manager [phone]  Patient Details  Name: Nicole GivensJanice D Jarema MRN: [id number] Date of Birth: August 06, 1941  Subjective/Objective:     Pt admitted s/p CABG               Action/Plan: PTA pt lived at home- plan to return home with family assistance- referral for DME and HH needs- orders placed for [facility] and RW- in to speak with pt at bedside- [facility]- per pt she would like to review agencies with daughter- CM to f/u in am prior to d/c regarding choice- spoke with Jermaine with [facility]- RW to be delivered to room prior to discharge.   Expected Discharge Date:     04/10/16             Expected Discharge Plan:  Home w Home Health Services  In-House Referral:     Discharge planning Services  CM Consult  Post Acute Care Choice:  Durable Medical Equipment, Home Health Choice offered to:  Patient  DME Arranged:  [place] rolling DME Agency:  [facility]  [facility]  Status of Service:  Completed, signed off  Medicare Important Message Given:  Yes Date Medicare IM Given:    Medicare IM give by:    Date Additional Medicare IM Given:    Additional Medicare Important Message give by:     If discussed at Long Length of Stay Meetings, dates discussed:  04/10/16   Discharge Disposition: home/home health   Additional Comments:  04/10/16- 0930- Donn PieriniKristi Christine Schiefelbein RN, BSN- [facility]- [facility] pt [facility]- referral called [facility] [place] [facility]-  Jermaine with [facility] to deliver RW to room this AM.  Per review with pt no further CM needs noted.   Darrold SpanWebster, Merisa Julio Hall, RN 04/10/2016, 9:41 AM

## 2016-04-10 NOTE — Progress Notes (Signed)
Patient to D/C home with daughter. Patient education done. IV removed. Tele monitor removed. CCMD notified. Personal belongings including dentures upper and lower given to patient. Patient to D/C home with daughter.   Valinda HoarLexie Kahealani Yankovich RN

## 2016-04-12 DIAGNOSIS — Z48812 Encounter for surgical aftercare following surgery on the circulatory system: Secondary | ICD-10-CM | POA: Diagnosis not present

## 2016-04-14 ENCOUNTER — Telehealth: Payer: Self-pay | Admitting: Cardiovascular Disease

## 2016-04-14 NOTE — Telephone Encounter (Signed)
Left detailed message on daughter's voice mail regarding Dr. Harvie BridgeNahser's advice on holding carvedilol and vasotec until appetite returns and to continue to monitor BP.  I advised her to call the office with questions or concerns and gave a reminder of appointment with [name], PA on 5/30.

## 2016-04-14 NOTE — Telephone Encounter (Signed)
New Message:    Please call,her blood pressure medicine is making her blood pressure real low.It was 88/50 yesterday.She was discharged Thursday from the hospital,she had open heart surgery.

## 2016-04-14 NOTE — Telephone Encounter (Signed)
Agree with holding coreg and vasotec until her appetite returns  Have her continue to check BP

## 2016-04-14 NOTE — Telephone Encounter (Signed)
Spoke with patient's daughter, [name], who states patient's BP has been low since being discharged from hospital on 5/10 s/p CABG.  She states repeat BP today is 106/55 after having patient hold carvedilol and vasotec last night and this morning.  She states the patient does not have any complaints but is not eating and drinking well - states she has no appetite.  I advised her to encourage the patient to eat some chicken noodle soup today and that I will discuss with Dr. Elease HashimotoNahser and call her back with his advice.   She verbalized understanding and agreement and thanked me for the call.

## 2016-04-26 NOTE — Progress Notes (Signed)
Cardiology Office Note    Date:  04/29/2016   ID:  Nicole Dean, DOB 07/25/1941, MRN [id number]  PCP:  [facility]  Cardiologist:  Dr. Elease Hashimoto   CC: Post hosp f/u - CABG  History of Present Illness:  Nicole Dean is a 75 y.o. female with a history of HTN, HLD, AAA requiring surgical repair and recently diagnosed CAD s/p CABG x4V (03/2016) who presents to clinic for post hospital follow up.   She has a history of AAA that has been followed with serial imaging which recently showed enlargement from previus scan. She was evaluated by Dr. [name] who recommended open repair. The patient underwent workup via Cardiology for surgical clearance. However, the patient was found to have a positive stress test which ultimately led to cardiac catheterization. 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 It was felt she would require coronary bypass grafting prior to proceeding with repair of her AAA. She was referred to [facility] and evaluated by Dr. Laneta Simmers at which time the patient admitted to a 6 month history of substernal chest pain and shortness of breath on exertion. She was admitted from 5/5-5/11/17 for CABG x4V utilizing LIMA to LAD, RIMA to PDA, SVG to Diagonal, and SVG to OM. She has post op anemia requiring 2U transfusion. Otherwise post op course was uncomplicated and she was discharged in good condition to home with home heath.    Review of phone notes reveal that she has had poor appetite and hypotension. Enalapril and Coreg have been held. BPs were running around ~80/50s, but she was asymptomatic. BPs have improved greatly off BB and ACE.  Today she presents to clinic for follow up. He appetite is back and she is feeling great. No chest pain aside from incision site. No SOB. No LE edema, orthopnea or PND. No dizziness or syncope. She is not going to work with cardiac rehab. She has been walking around the house with no exertional sx. She has had some  cramping in her legs and feet.   Past Medical History  Diagnosis Date  . Depression   . Hyperlipidemia   . Hypertension   . Coronary artery disease   . Shortness of breath dyspnea     sometimes even with resting   . Anxiety   . GERD (gastroesophageal reflux disease)   . Arthritis     hands  . AAA (abdominal aortic aneurysm) (HCC)     VVS- following     Past Surgical History  Procedure Laterality Date  . Shoulder arthroscopy w/ rotator cuff repair Right   . Peripheral vascular catheterization N/A 03/03/2016    Procedure: Abdominal Aortogram w/Lower Extremity;  Surgeon: Chuck Hint, MD;  Location: [facility] INVASIVE CV LAB;  Service: Cardiovascular;  Laterality: N/A;  . Cardiac catheterization N/A 03/31/2016    Procedure: Left Heart Cath and Coronary Angiography;  Surgeon: Peter M Swaziland, MD;  Location: [facility] INVASIVE CV LAB;  Service: Cardiovascular;  Laterality: N/A;  . Eye surgery Bilateral     cataracts removed./ IOL  . Coronary artery bypass graft N/A 04/04/2016    Procedure: CORONARY ARTERY BYPASS GRAFTING (CABG) x 4 using bilateral internal mammary arteries and right saphenous vein harvested by endovein;  Surgeon: Alleen Borne, MD;  Location: [facility] OR;  Service: Open Heart Surgery;  Laterality: N/A;  . Tee without cardioversion N/A 04/04/2016    Procedure: TRANSESOPHAGEAL ECHOCARDIOGRAM (TEE);  Surgeon: Alleen Borne, MD;  Location: [facility] OR;  Service: Open Heart Surgery;  Laterality: N/A;    Current Medications: Outpatient Prescriptions Prior to Visit  Medication Sig Dispense Refill  . aspirin EC 325 MG EC tablet Take 1 tablet (325 mg total) by mouth daily. 30 tablet 0  . atorvastatin (LIPITOR) 40 MG tablet Take 1 tablet (40 mg total) by mouth daily. 30 tablet 11  . busPIRone (BUSPAR) 30 MG tablet Take 30 mg by mouth 2 (two) times daily. Reported on 04/01/2016    . ferrous fumarate-b12-vitamic C-folic acid (TRINSICON / FOLTRIN) capsule Take 1 capsule by mouth every morning. 30 capsule  1  . Multiple Vitamins-Iron (MULTIVITAMIN/IRON PO) Take 1 tablet by mouth daily.    Marland Kitchen omeprazole (PRILOSEC) 40 MG capsule Take 40 mg by mouth at bedtime.     . sertraline (ZOLOFT) 100 MG tablet Take 100 mg by mouth daily with breakfast.     . traMADol (ULTRAM) 50 MG tablet Take 1-2 tablets (50-100 mg total) by mouth every 4 (four) hours as needed for moderate pain. 30 tablet 0  . traZODone (DESYREL) 100 MG tablet Take 100 mg by mouth at bedtime.    . hydrochlorothiazide (HYDRODIURIL) 25 MG tablet Take 25 mg by mouth daily.    . busPIRone (BUSPAR) 15 MG tablet Take 15 mg by mouth 2 (two) times daily. 15 mg every morning and 30 mg every evening    . carvedilol (COREG) 6.25 MG tablet Take 1 tablet (6.25 mg total) by mouth 2 (two) times daily. 60 tablet 11  . enalapril (VASOTEC) 10 MG tablet Take 1 tablet (10 mg total) by mouth daily. 30 tablet 11  . Omega-3 Fatty Acids (FISH OIL) 1200 MG CAPS Take 1,200 mg by mouth 2 (two) times daily.     No facility-administered medications prior to visit.     Allergies:   Codeine and Penicillins   Social History   Social History  . Marital Status: Divorced    Spouse Name: N/A  . Number of Children: N/A  . Years of Education: N/A   Social History Main Topics  . Smoking status: Former Smoker -- 1.50 packs/day for 35 years    Types: Cigarettes    Quit date: 02/15/1996  . Smokeless tobacco: Never Used  . Alcohol Use: No  . Drug Use: No  . Sexual Activity: Not Asked   Other Topics Concern  . None   Social History Narrative     Family History:  The patient's family history includes Cancer in her father.   ROS:   Please see the history of present illness.    ROS All other systems reviewed and are negative.   PHYSICAL EXAM:   VS:  BP 126/68 mmHg  Pulse 86  Ht 5\' 2"  (1.575 m)  Wt 136 lb (61.689 kg)  BMI 24.87 kg/m2   GEN: Well nourished, well developed, in no acute distress HEENT: normal Neck: no JVD, carotid bruits, or masses Cardiac:  RRR; no murmurs, rubs, or gallops,no edema  Respiratory:  clear to auscultation bilaterally, normal work of breathing GI: soft, nontender, nondistended, + BS MS: no deformity or atrophy Skin: warm and dry, no rash Neuro:  Alert and Oriented x 3, Strength and sensation are intact Psych: euthymic mood, full affect  Wt Readings from Last 3 Encounters:  04/29/16 136 lb (61.689 kg)  04/10/16 138 lb 8 oz (62.823 kg)  04/03/16 139 lb 1.6 oz (63.095 kg)      Studies/Labs Reviewed:   EKG:  EKG is ordered today.  The ekg ordered today demonstrates NSR, septal Q waves HR 86  Recent Labs: 04/03/2016: ALT 13* 04/05/2016: Magnesium 2.1 04/09/2016: BUN 8; Creatinine, Ser 0.49; Potassium 3.8; Sodium 142 04/10/2016: Hemoglobin 9.9*; Platelets 261   Lipid Panel    Component Value Date/Time   CHOL 199 09/01/2012 1429   TRIG 302* 09/01/2012 1429   HDL 42 09/01/2012 1429   CHOLHDL 4.7 09/01/2012 1429   VLDL 60* 09/01/2012 1429   LDLCALC 97 09/01/2012 1429   LDLDIRECT 82 03/23/2009 2152    Additional studies/ records that were reviewed today include:  Procedures 03/31/16   Left Heart Cath and Coronary Angiography    Conclusion     Ost LM to LM lesion, 70% stenosed.  Prox LAD to Mid LAD lesion, 90% stenosed.  Mid LAD lesion, 90% stenosed.  Ost Cx lesion, 80% stenosed.  Prox RCA to Mid RCA lesion, 100% stenosed.  The left ventricular systolic function is normal.  1. Severe 3 vessel and left main CAD 2. Normal LV function  Plan: Recommend CABG.         ASSESSMENT:    1. Coronary artery disease involving native coronary artery of native heart without angina pectoris   2. Abdominal aortic aneurysm (AAA) without rupture (HCC)   3. Essential hypertension   4. HLD (hyperlipidemia)   5. Cramp of both lower extremities      PLAN:  In order of problems listed above:  CAD s/p CABG x 4V: stable. Continue Aspirin, statin and BB.  AAA: will defer to Dr. Edilia Boickson for timing of  repair of this after recent CABG  HTN: BP had been low since discharge and Coreg and Enalapril were been held. BP 126/68 today. She has been on HCTZ only. I would rather her be on Coreg with CAD. So will stop HCTZ. He will watch her blood pressure at home and call us if her numbers exceed 140/90. If her blood pressure does go up we can either add back enalapril or HCTZ as needed.  HLD: continue high intensity statin .  Leg cramps: will get BMET  Medication Adjustments/Labs and Tests Ordered: Current medicines are reviewed at length with the patient today.  Concerns regarding medicines are outlined above.  Medication changes, Labs and Tests ordered today are listed in the Patient Instructions below. Patient Instructions  Medication Instructions:  Your physician has recommended you make the following change in your medication:  1.  STOP the Hydrochlorothiazide 2.  RESUME the Coreg 6.25 taking 1 tablet twice a day   Labwork: TODAY:  BMET  Testing/Procedures: None ordered  Follow-Up: Your physician recommends that you schedule a follow-up appointment in: KEEP YOUR SCHEDULED APPOINTMENTS    Any Other Special Instructions Will Be Listed Below (If Applicable).  PLEASE NOTE: KEEP A CHECK ON YOUR BLOOD PRESSURE WITH THE MEDICATION CHANGE.  IF IT RUNS HIGHER THAN 140/90 THEN GIVE OUR OFFICE A CALL.     If you need a refill on your cardiac medications before your next appointment, please call your pharmacy.       Signed, Cline CrockKathryn Buford Bremer, PA-C  04/29/2016 10:02 AM    [facility] [address], [place], [place]  [address] Phone: [phone]; Fax: [phone]

## 2016-04-29 ENCOUNTER — Encounter: Payer: Self-pay | Admitting: Physician Assistant

## 2016-04-29 ENCOUNTER — Ambulatory Visit (INDEPENDENT_AMBULATORY_CARE_PROVIDER_SITE_OTHER): Payer: Medicare HMO | Admitting: Physician Assistant

## 2016-04-29 VITALS — BP 126/68 | HR 86 | Ht 62.0 in | Wt 136.0 lb

## 2016-04-29 DIAGNOSIS — E785 Hyperlipidemia, unspecified: Secondary | ICD-10-CM

## 2016-04-29 DIAGNOSIS — R252 Cramp and spasm: Secondary | ICD-10-CM

## 2016-04-29 DIAGNOSIS — I714 Abdominal aortic aneurysm, without rupture, unspecified: Secondary | ICD-10-CM

## 2016-04-29 DIAGNOSIS — I350 Nonrheumatic aortic (valve) stenosis: Secondary | ICD-10-CM

## 2016-04-29 DIAGNOSIS — I2510 Atherosclerotic heart disease of native coronary artery without angina pectoris: Secondary | ICD-10-CM

## 2016-04-29 DIAGNOSIS — I10 Essential (primary) hypertension: Secondary | ICD-10-CM

## 2016-04-29 LAB — BASIC METABOLIC PANEL
BUN: 11 mg/dL (ref 7–25)
CHLORIDE: 103 mmol/L (ref 98–110)
CO2: 28 mmol/L (ref 20–31)
CREATININE: 0.56 mg/dL — AB (ref 0.60–0.93)
Calcium: 9.1 mg/dL (ref 8.6–10.4)
Glucose, Bld: 86 mg/dL (ref 65–99)
POTASSIUM: 4.1 mmol/L (ref 3.5–5.3)
Sodium: 139 mmol/L (ref 135–146)

## 2016-04-29 MED ORDER — CARVEDILOL 6.25 MG PO TABS: 6.2500 mg | ORAL_TABLET | Freq: Two times a day (BID) | ORAL | Status: DC

## 2016-04-29 NOTE — Patient Instructions (Addendum)
Medication Instructions:  Your physician has recommended you make the following change in your medication:  1.  STOP the Hydrochlorothiazide 2.  RESUME the Coreg 6.25 taking 1 tablet twice a day   Labwork: TODAY:  BMET  Testing/Procedures: None ordered  Follow-Up: Your physician recommends that you schedule a follow-up appointment in: KEEP YOUR SCHEDULED APPOINTMENTS    Any Other Special Instructions Will Be Listed Below (If Applicable).  PLEASE NOTE: KEEP A CHECK ON YOUR BLOOD PRESSURE WITH THE MEDICATION CHANGE.  IF IT RUNS HIGHER THAN 140/90 THEN GIVE OUR OFFICE A CALL.     If you need a refill on your cardiac medications before your next appointment, please call your pharmacy.

## 2016-05-06 ENCOUNTER — Other Ambulatory Visit: Payer: Self-pay | Admitting: Surgery

## 2016-05-06 DIAGNOSIS — Z951 Presence of aortocoronary bypass graft: Secondary | ICD-10-CM

## 2016-05-07 ENCOUNTER — Telehealth: Payer: Self-pay | Admitting: Cardiovascular Disease

## 2016-05-07 ENCOUNTER — Ambulatory Visit (INDEPENDENT_AMBULATORY_CARE_PROVIDER_SITE_OTHER): Payer: Self-pay | Admitting: Surgery

## 2016-05-07 ENCOUNTER — Encounter: Payer: Self-pay | Admitting: Surgery

## 2016-05-07 ENCOUNTER — Ambulatory Visit
Admission: RE | Admit: 2016-05-07 | Discharge: 2016-05-07 | Disposition: A | Discharge status: Home / Self Care | Payer: Medicare HMO | Source: Ambulatory Visit | Attending: Surgery | Admitting: Surgery

## 2016-05-07 VITALS — BP 180/80 | HR 80 | Resp 20 | Ht 62.0 in | Wt 136.0 lb

## 2016-05-07 DIAGNOSIS — Z951 Presence of aortocoronary bypass graft: Secondary | ICD-10-CM

## 2016-05-07 DIAGNOSIS — I2510 Atherosclerotic heart disease of native coronary artery without angina pectoris: Secondary | ICD-10-CM

## 2016-05-07 NOTE — Telephone Encounter (Signed)
New message      Pt c/o medication issue:  1. Name of Medication:carvedilol 2. How are you currently taking this medication (dosage and times per day)? 6.25mg  bid 3. Are you having a reaction (difficulty breathing--STAT)? no 4. What is your medication issue?  Medication is making legs swell and hurt.  It also makes her feels bad.  Pt did not take carvedilol this am.  Please advise

## 2016-05-07 NOTE — Telephone Encounter (Signed)
Spoke with patient's daughter, [name], who is currently with the patient; they have been at an appointment with Dr. Laneta SimmersBartle.  They discussed the issue of leg swelling and the thought that it was caused by the carvedilol with Dr. Laneta SimmersBartle.  She states he advised the patient to continue carvedilol and to restart hctz.  He advised the patient to monitor BP and to call our office with questions or concerns.  I thanked [name] for the update and encouraged her to call back if needed.  Patient is scheduled for follow-up with Dr. Elease HashimotoNahser on 7/5.  [name] thanked me for the call.

## 2016-05-10 ENCOUNTER — Encounter: Payer: Self-pay | Admitting: Surgery

## 2016-05-10 NOTE — Progress Notes (Signed)
HPI: Patient returns for routine postoperative follow-up having undergone CABG x 4 on 04/04/2016. The patient's early postoperative recovery while in the hospital was notable for an uncomplicated postop course. She did have some abdominal pain a few days postop and had a repeat CTA of the abdomen to assess her 6 cm AAA but everything was stable. I suspect that her pain was probably gas. Since hospital discharge the patient reports that she has been progressing slowly. Her stamina is slowly improving. Her only complaint is of swelling in her ankles and feet. She was on HCTZ but it was recently stopped by cardiology because her BP was low. She says that the edema started after that.   Current Outpatient Prescriptions  Medication Sig Dispense Refill  . aspirin EC 325 MG EC tablet Take 1 tablet (325 mg total) by mouth daily. 30 tablet 0  . atorvastatin (LIPITOR) 40 MG tablet Take 1 tablet (40 mg total) by mouth daily. 30 tablet 11  . busPIRone (BUSPAR) 30 MG tablet Take 30 mg by mouth 2 (two) times daily. Reported on 04/01/2016    . carvedilol (COREG) 6.25 MG tablet Take 1 tablet (6.25 mg total) by mouth 2 (two) times daily. 60 tablet 11  . ferrous fumarate-b12-vitamic C-folic acid (TRINSICON / FOLTRIN) capsule Take 1 capsule by mouth every morning. 30 capsule 1  . Multiple Vitamins-Iron (MULTIVITAMIN/IRON PO) Take 1 tablet by mouth daily.    Marland Kitchen omeprazole (PRILOSEC) 40 MG capsule Take 40 mg by mouth at bedtime.     . sertraline (ZOLOFT) 100 MG tablet Take 100 mg by mouth daily with breakfast.     . traMADol (ULTRAM) 50 MG tablet Take 1-2 tablets (50-100 mg total) by mouth every 4 (four) hours as needed for moderate pain. 30 tablet 0  . traZODone (DESYREL) 100 MG tablet Take 100 mg by mouth at bedtime.     No current facility-administered medications for this visit.    Physical Exam: BP 180/80 mmHg  Pulse 80  Resp 20  Ht  (1.575 m)  Wt 136 lb (61.689 kg)  BMI 24.87 kg/m2  SpO2  95% She looks well. Lung exam is clear. Cardiac exam shows a regular rate and rhythm with normal heart sounds. Chest incision is healing well and sternum is stable. The leg incisions are healing well and there is mild peripheral edema in the ankles and feet.   Diagnostic Tests:  CLINICAL DATA: History of CABG, followup  EXAM: CHEST 2 VIEW  COMPARISON: Chest x-ray of 04/07/2016  FINDINGS: There has been increase in volume of the right pleural effusion with increase in right basilar atelectasis. A tiny left pleural effusion is noted. Cardiomegaly is stable. Median sternotomy sutures are noted from CABG.  IMPRESSION: 1. Increase in volume of small right pleural effusion with right basilar atelectasis. 2. Tiny left pleural effusion.   Electronically Signed  By: Dwyane Dee M.D.  On: 05/07/2016 12:00  Impression:  Overall I think she is doing well. I encouraged her to continue walking. She is planning to participate in cardiac rehab. I told her that she could drive a car but should not lift anything heavier than 10 lbs for three months postop. I told her to resume her HCTZ since her BP is elevated. This should resolve her swelling. She will probably need to wait for 3 months after surgery to have her AAA repair to make sure she has made sufficient recovery to have the best chance for a good recovery  following that surgery.   Plan:  She will continue follow up with cardiology. She will return to see Dr. Edilia Boickson concerning treatment of her AAA.    Alleen BorneBryan K Bartle, MD [facility] [phone]

## 2016-06-04 ENCOUNTER — Ambulatory Visit (INDEPENDENT_AMBULATORY_CARE_PROVIDER_SITE_OTHER): Payer: Medicare HMO | Admitting: Cardiovascular Disease

## 2016-06-04 ENCOUNTER — Encounter (INDEPENDENT_AMBULATORY_CARE_PROVIDER_SITE_OTHER): Payer: Self-pay

## 2016-06-04 ENCOUNTER — Encounter: Payer: Self-pay | Admitting: Cardiovascular Disease

## 2016-06-04 VITALS — BP 110/56 | HR 50 | Ht 62.0 in | Wt 138.1 lb

## 2016-06-04 DIAGNOSIS — I714 Abdominal aortic aneurysm, without rupture, unspecified: Secondary | ICD-10-CM

## 2016-06-04 DIAGNOSIS — I25119 Atherosclerotic heart disease of native coronary artery with unspecified angina pectoris: Secondary | ICD-10-CM

## 2016-06-04 DIAGNOSIS — I2510 Atherosclerotic heart disease of native coronary artery without angina pectoris: Secondary | ICD-10-CM

## 2016-06-04 DIAGNOSIS — I10 Essential (primary) hypertension: Secondary | ICD-10-CM

## 2016-06-04 LAB — BASIC METABOLIC PANEL
BUN: 14 mg/dL (ref 7–25)
CALCIUM: 9.2 mg/dL (ref 8.6–10.4)
CO2: 26 mmol/L (ref 20–31)
CREATININE: 0.62 mg/dL (ref 0.60–0.93)
Chloride: 104 mmol/L (ref 98–110)
GLUCOSE: 116 mg/dL — AB (ref 65–99)
Potassium: 3.5 mmol/L (ref 3.5–5.3)
Sodium: 141 mmol/L (ref 135–146)

## 2016-06-04 NOTE — Progress Notes (Signed)
Cardiology Office Note   Date:  06/04/2016   ID:  Nicole Dean, DOB 1941-11-13, MRN [id number]  PCP:  [facility]  Cardiologist:   Kristeen MissPhilip Nahser, MD   Chief Complaint  Patient presents with  . Follow-up    Problem List 1. AAA  2. Essential HTN 3. Hyperlipidemia 4. CAD - CABG , Apr 04, 2016   History of Present Illness: Nicole Dean is a 75 y.o. female who presents for pre-op eval prior to AAA repair Was seen with daughter , Nicole Dean .  She has known about this AAA for a while,   Was offered repair but she declined - too much going on Recently found that the AAA had enlarged Does not have any abdominal pain  - has bilateral groin pain with walking   Has leg pain and weakness with climbing stairs.  No real CP or dyspnea   Former smoker - quit 17 year ago .   June 04, 2016: She has had CABG by Dr. Laneta SimmersBartle on May 5. She appears to be doing quite well. No CP , breathing is good.  She saw [name]. PA  on May 30. Her HCTZ was stopped at that time but Nicole Dean developed significant leg edema. The HCTZ has been restarted and she seems to be doing quite well.  Still has not had herr AAA repaired yet    Past Medical History  Diagnosis Date  . Depression   . Hyperlipidemia   . Hypertension   . Coronary artery disease   . Shortness of breath dyspnea     sometimes even with resting   . Anxiety   . GERD (gastroesophageal reflux disease)   . Arthritis     hands  . AAA (abdominal aortic aneurysm) (HCC)     VVS- following     Past Surgical History  Procedure Laterality Date  . Shoulder arthroscopy w/ rotator cuff repair Right   . Peripheral vascular catheterization N/A 03/03/2016    Procedure: Abdominal Aortogram w/Lower Extremity;  Surgeon: Chuck Hinthristopher S Dickson, MD;  [facility];  Service: Cardiovascular;  Laterality: N/A;  . Cardiac catheterization N/A 03/31/2016    Procedure: Left Heart Cath and Coronary Angiography;   Surgeon: Peter M SwazilandJordan, MD;  [facility];  Service: Cardiovascular;  Laterality: N/A;  . Eye surgery Bilateral     cataracts removed./ IOL  . Coronary artery bypass graft N/A 04/04/2016    Procedure: CORONARY ARTERY BYPASS GRAFTING (CABG) x 4 using bilateral internal mammary arteries and right saphenous vein harvested by endovein;  Surgeon: Alleen BorneBryan K Bartle, MD;  Location: [facility] OR;  Service: Open Heart Surgery;  Laterality: N/A;  . Tee without cardioversion N/A 04/04/2016    Procedure: TRANSESOPHAGEAL ECHOCARDIOGRAM (TEE);  Surgeon: Alleen BorneBryan K Bartle, MD;  [facility];  Service: Open Heart Surgery;  Laterality: N/A;     Current Outpatient Prescriptions  Medication Sig Dispense Refill  . aspirin EC 325 MG EC tablet Take 1 tablet (325 mg total) by mouth daily. 30 tablet 0  . atorvastatin (LIPITOR) 40 MG tablet Take 1 tablet (40 mg total) by mouth daily. 30 tablet 11  . busPIRone (BUSPAR) 30 MG tablet Take 30 mg by mouth 2 (two) times daily. Reported on 04/01/2016    . carvedilol (COREG) 6.25 MG tablet Take 1 tablet (6.25 mg total) by mouth 2 (two) times daily. 60 tablet 11  . ferrous fumarate-b12-vitamic C-folic acid (TRINSICON / FOLTRIN) capsule  Take 1 capsule by mouth every morning. 30 capsule 1  . hydrochlorothiazide (HYDRODIURIL) 25 MG tablet Take 25 mg by mouth daily.  1  . Multiple Vitamins-Iron (MULTIVITAMIN/IRON PO) Take 1 tablet by mouth daily.    Marland Kitchen. omeprazole (PRILOSEC) 40 MG capsule Take 40 mg by mouth at bedtime.     . sertraline (ZOLOFT) 100 MG tablet Take 100 mg by mouth daily with breakfast.     . traMADol (ULTRAM) 50 MG tablet Take 1-2 tablets (50-100 mg total) by mouth every 4 (four) hours as needed for moderate pain. 30 tablet 0  . traZODone (DESYREL) 100 MG tablet Take 100 mg by mouth at bedtime.     No current facility-administered medications for this visit.    Allergies:   Codeine and Penicillins    Social History:  The patient  reports that she quit smoking  about 20 years ago. Her smoking use included Cigarettes. She has a 52.5 pack-year smoking history. She has never used smokeless tobacco. She reports that she does not drink alcohol or use illicit drugs.   Family History:  The patient's family history includes Cancer in her father.    ROS:  Please see the history of present illness.    Review of Systems: Constitutional:  denies fever, chills, diaphoresis, appetite change and fatigue.  HEENT: denies photophobia, eye pain, redness, hearing loss, ear pain, congestion, sore throat, rhinorrhea, sneezing, neck pain, neck stiffness and tinnitus.  Respiratory: denies SOB, DOE, cough, chest tightness, and wheezing.  Cardiovascular: denies chest pain, palpitations and leg swelling.  Gastrointestinal: denies nausea, vomiting, abdominal pain, diarrhea, constipation, blood in stool.  Genitourinary: denies dysuria, urgency, frequency, hematuria, flank pain and difficulty urinating.  Musculoskeletal: denies  myalgias, back pain, joint swelling, arthralgias and gait problem.   Skin: denies pallor, rash and wound.  Neurological: denies dizziness, seizures, syncope, weakness, light-headedness, numbness and headaches.   Hematological: denies adenopathy, easy bruising, personal or family bleeding history.  Psychiatric/ Behavioral: denies suicidal ideation, mood changes, confusion, nervousness, sleep disturbance and agitation.       All other systems are reviewed and negative.    PHYSICAL EXAM: VS:  BP 110/56 mmHg  Pulse 50  Ht 5\' 2"  (1.575 m)  Wt 138 lb 1.9 oz (62.651 kg)  BMI 25.26 kg/m2 , BMI Body mass index is 25.26 kg/(m^2). GEN: Well nourished, well developed, in no acute distress HEENT: normal Neck: no JVD, carotid bruits, or masses Cardiac: RRR; no murmurs, rubs, or gallops,no edema  Respiratory:  clear to auscultation bilaterally, normal work of breathing GI: soft, + abdomina bruit,  pulsitile mass  MS: no deformity or atrophy Skin: warm  and dry, no rash Neuro:  Strength and sensation are intact Psych: normal   EKG:  EKG is not ordered today.   Recent Labs: 04/03/2016: ALT 13* 04/05/2016: Magnesium 2.1 04/10/2016: Hemoglobin 9.9*; Platelets 261 04/29/2016: BUN 11; Creat 0.56*; Potassium 4.1; Sodium 139    Lipid Panel    Component Value Date/Time   CHOL 199 09/01/2012 1429   TRIG 302* 09/01/2012 1429   HDL 42 09/01/2012 1429   CHOLHDL 4.7 09/01/2012 1429   VLDL 60* 09/01/2012 1429   LDLCALC 97 09/01/2012 1429   LDLDIRECT 82 03/23/2009 2152      Wt Readings from Last 3 Encounters:  06/04/16 138 lb 1.9 oz (62.651 kg)  05/07/16 136 lb (61.689 kg)  04/29/16 136 lb (61.689 kg)      Other studies Reviewed: Additional studies/ records that were reviewed today  include: . Review of the above records demonstrates:    ASSESSMENT AND PLAN:  1.  AAA - The plan is for her to have abdominal aortic aneurysm repair approximately 3 months after her coronary artery bypass grafting.     2. CAD :   She had an abnormal Myoview study and subsequent cardiac catheterization revealed significant coronary artery disease. She's now status post coronary artery bypass grafting  Left internal mammary graft to the LAD  Right internal mammary graft to the PDA  SVG to diagonal  SVG to OM     She seems to be doing well. She's heaving nicely. I'll see her in 3-4 months ( after her AAA surgery )   3. Essential hypertension:  Continue current medications. Her blood pressure is normal. She's not having any edema.   Current medicines are reviewed at length with the patient today.  The patient does not have concerns regarding medicines.  The following changes have been made:  no change  Labs/ tests ordered today include:  No orders of the defined types were placed in this encounter.     Disposition:   FU with me in 3-4 months      Kristeen Miss, MD  06/04/2016 9:34 AM    [facility] [address]  [place], [place], [place]  [address] Phone: [phone]; Fax: [phone]   [facility]  [address] [place], [place]  [address] [phone]   Fax [phone]

## 2016-06-04 NOTE — Patient Instructions (Addendum)
Medication Instructions:  Your physician recommends that you continue on your current medications as directed. Please refer to the Current Medication list given to you today.  Labwork: Your physician recommends that you have lab work today: BMP  Testing/Procedures: No new orders.   Follow-Up: Your physician wants you to follow-up in: 3-4 MONTHS with Dr Elease HashimotoNahser. You will receive a reminder letter in the mail two months in advance. If you don't receive a letter, please call our office to schedule the follow-up appointment.    Any Other Special Instructions Will Be Listed Below (If Applicable).     If you need a refill on your cardiac medications before your next appointment, please call your pharmacy.

## 2016-06-05 ENCOUNTER — Telehealth: Payer: Self-pay | Admitting: Nurse Practitioner

## 2016-06-05 MED ORDER — POTASSIUM CHLORIDE ER 10 MEQ PO TBCR: 10.0000 meq | EXTENDED_RELEASE_TABLET | Freq: Every day | ORAL | Status: DC

## 2016-06-05 NOTE — Telephone Encounter (Signed)
-----   Message from Vesta MixerPhilip J Nahser, MD sent at 06/04/2016  5:06 PM EDT ----- Add Kdur 10 meq a day

## 2016-06-05 NOTE — Telephone Encounter (Signed)
Patient aware of lab results and to start Kdur 10 meq daily.  She verbalized understanding and agreement with plan.

## 2016-06-27 ENCOUNTER — Encounter: Payer: Self-pay | Admitting: Vascular Surgery

## 2016-07-02 ENCOUNTER — Encounter: Payer: Self-pay | Admitting: Vascular Surgery

## 2016-07-02 ENCOUNTER — Ambulatory Visit (INDEPENDENT_AMBULATORY_CARE_PROVIDER_SITE_OTHER): Payer: Medicare HMO | Admitting: Vascular Surgery

## 2016-07-02 VITALS — BP 124/68 | HR 73 | Temp 97.8°F | Resp 16 | Ht 62.0 in | Wt 135.0 lb

## 2016-07-02 DIAGNOSIS — I714 Abdominal aortic aneurysm, without rupture, unspecified: Secondary | ICD-10-CM

## 2016-07-02 NOTE — Progress Notes (Signed)
[facility]  Patient name: Nicole Dean MRN: [id number] DOB: 26-Nov-1941 Sex: female  REASON FOR VISIT: To discuss open repair of abdominal aortic aneurysm.  HPI: Nicole Dean is a 75 y.o. female, who I last saw on 03/12/2016. I originally saw her back in 2015 with a 5.8 cm infrarenal abdominal aortic aneurysm. I recommended elective repair however she did not wish to proceed with repair at that time. The aneurysm was followed by her primary care physician and that it is enlarged to 6.0 cm. She was then agreeable to consider elective repair. 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 The aneurysm also extends up near the renal arteries. For these reasons she was not a candidate for endovascular repair of her aneurysm. Of note, she also has a circumaortic left renal vein.   She underwent preoperative cardiac evaluation and was found to have significant coronary artery disease. She required coronary revascularization by Dr. Laneta Simmers. She had a CABG 4 on 04/04/2016. It was felt that she should wait 3 months after her CABG before considering aneurysm repair. She has also been seen by Dr. Elease Hashimoto and has been cleared for open repair of her aneurysm.  She has done well since her heart surgery and denies any chest pain or chest pressure. She has been gradually resuming her normal activities. She denies abdominal pain or back pain.  She does have bilateral lower extremity claudication which is more significant on the right side. This involves her calves and thighs bilaterally. Her pain is brought on by ambulation and relieved with rest. She denies any history of rest pain.  She is on aspirin and is on a statin.  Past Medical History:  Diagnosis Date  . AAA (abdominal aortic aneurysm) (HCC)    VVS- following   . Anxiety   . Arthritis    hands  . Coronary artery disease   . Depression   . GERD (gastroesophageal reflux  disease)   . Hyperlipidemia   . Hypertension   . Shortness of breath dyspnea    sometimes even with resting     Family History  Problem Relation Age of Onset  . Cancer Father     stomach    SOCIAL HISTORY: She quit tobacco in 1997. Social History   Social History  . Marital status: Divorced    Spouse name: N/A  . Number of children: N/A  . Years of education: N/A   Occupational History  . Not on file.   Social History Main Topics  . Smoking status: Former Smoker    Packs/day: 1.50    Years: 35.00    Types: Cigarettes    Quit date: 02/15/1996  . Smokeless tobacco: Never Used  . Alcohol use No  . Drug use: No  . Sexual activity: Not on file   Other Topics Concern  . Not on file   Social History Narrative  . No narrative on file    Allergies  Allergen Reactions  . Codeine Nausea And Vomiting    Severe, ended up in ED  . Penicillins Nausea Only    Has patient had a PCN reaction causing immediate rash, facial/tongue/throat swelling, SOB or lightheadedness with hypotension: Yes Has patient had a PCN reaction causing severe rash involving mucus membranes or skin necrosis: No Has patient had a PCN reaction that required hospitalization No Has patient had a PCN reaction occurring within the last 10 years: No If all  of the above answers are "NO", then may proceed with Cephalosporin use.     Current Outpatient Prescriptions  Medication Sig Dispense Refill  . aspirin EC 325 MG EC tablet Take 1 tablet (325 mg total) by mouth daily. 30 tablet 0  . busPIRone (BUSPAR) 30 MG tablet Take 30 mg by mouth 2 (two) times daily. Reported on 04/01/2016    . carvedilol (COREG) 6.25 MG tablet Take 1 tablet (6.25 mg total) by mouth 2 (two) times daily. 60 tablet 11  . ferrous fumarate-b12-vitamic C-folic acid (TRINSICON / FOLTRIN) capsule Take 1 capsule by mouth every morning. 30 capsule 1  . hydrochlorothiazide (HYDRODIURIL) 25 MG tablet Take 25 mg by mouth daily.  1  . Multiple  Vitamins-Iron (MULTIVITAMIN/IRON PO) Take 1 tablet by mouth daily.    Marland Kitchen omeprazole (PRILOSEC) 40 MG capsule Take 40 mg by mouth at bedtime.     . potassium chloride (K-DUR) 10 MEQ tablet Take 1 tablet (10 mEq total) by mouth daily. 30 tablet 11  . sertraline (ZOLOFT) 100 MG tablet Take 100 mg by mouth daily with breakfast.     . traMADol (ULTRAM) 50 MG tablet Take 1-2 tablets (50-100 mg total) by mouth every 4 (four) hours as needed for moderate pain. 30 tablet 0  . traZODone (DESYREL) 100 MG tablet Take 100 mg by mouth at bedtime.    Marland Kitchen atorvastatin (LIPITOR) 40 MG tablet Take 1 tablet (40 mg total) by mouth daily. (Patient not taking: Reported on 07/02/2016) 30 tablet 11   No current facility-administered medications for this visit.     REVIEW OF SYSTEMS:   denotes positive finding,  denotes negative finding Cardiac  Comments:  Chest pain or chest pressure:    Shortness of breath upon exertion:    Short of breath when lying flat:    Irregular heart rhythm:        Vascular    Pain in calf, thigh, or hip brought on by ambulation: X Bilateral lower extremity claudication which is significantly worse on the right side.  Pain in feet at night that wakes you up from your sleep:     Blood clot in your veins:    Leg swelling:         Pulmonary    Oxygen at home:    Productive cough:     Wheezing:         Neurologic    Sudden weakness in arms or legs:     Sudden numbness in arms or legs:     Sudden onset of difficulty speaking or slurred speech:    Temporary loss of vision in one eye:     Problems with dizziness:         Gastrointestinal    Blood in stool:     Vomited blood:         Genitourinary    Burning when urinating:     Blood in urine:        Psychiatric    Major depression:         Hematologic    Bleeding problems:    Problems with blood clotting too easily:        Skin    Rashes or ulcers:        Constitutional    Fever or chills:      PHYSICAL  EXAM: Vitals:   07/02/16 1353  BP: 124/68  Pulse: 73  Resp: 16  Temp: 97.8 F (36.6 C)  TempSrc: Oral  SpO2: 92%  Weight: 135 lb (61.2 kg)  Height: 5\' 2"  (1.575 m)    GENERAL: The patient is a well-nourished female, in no acute distress. The vital signs are documented above. CARDIAC: There is a regular rate and rhythm.  VASCULAR: I do not protect carotid bruits. She has a diminished but palpable left femoral pulse. I cannot palpate a right femoral pulse. I cannot palpate pedal pulses. PULMONARY: There is good air exchange bilaterally without wheezing or rales. ABDOMEN: Soft and non-tender with normal pitched bowel sounds. Her aneurysm is palpable and nontender. MUSCULOSKELETAL: There are no major deformities or cyanosis. NEUROLOGIC: No focal weakness or paresthesias are detected. SKIN: There are no ulcers or rashes noted. PSYCHIATRIC: The patient has a normal affect.  DATA:   CT ANGIOGRAM: I reviewed her CT angiogram that was done on 04/08/2016. This shows a 6.1 cm infrarenal abdominal aortic aneurysm. She has a focal stenosis at the takeoff of the celiac axis and also superior mesenteric artery as well as at the origin of the right common iliac artery.  ARTERIOGRAM:   MEDICAL ISSUES:  6 CM INFRARENAL ABDOMINAL AORTIC ANEURYSM: Given the size of her aneurysm, the risk of rupture is approximately 10-15% per year. I have recommended that we proceed with elective open repair. She is not a candidate for endovascular repair given that the aneurysm extends up near her renal arteries and there is no adequate neck. 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 The situation is further complicated by her circumaortic left renal vein. We have discussed the indications for aneurysm repair. I have explained that the risk of rupture without repair is approximately 5-10% per year. We have discussed the advantages and disadvantages of open versus  endovascular repair.  The patient wishes to proceed with open repair. I have discussed the potential complications of surgery, including but not limited to bleeding, renal failure, MI, wound healing problems, hernia, graft infection, embolization, or other unpredictable medical problems. I have explained that the risk of mortality or major morbidity is approximately 5%. All of the patients questions were answered and they are agreeable to proceed with surgery. Her surgery is scheduled for 07/22/2016.    Waverly Ferrariickson, [place] [facility] [place] [phone]

## 2016-07-04 ENCOUNTER — Other Ambulatory Visit: Payer: Self-pay | Admitting: *Deleted

## 2016-07-15 ENCOUNTER — Encounter (HOSPITAL_COMMUNITY): Payer: Self-pay

## 2016-07-15 NOTE — Pre-Procedure Instructions (Signed)
Nicole GivensJanice D Dean  07/15/2016      [facility] [place]- [place], [place] - [address] [place] [address] Phone: [phone] Fax: [phone]  [facility] - [place], [place] - [address] [place] [id number] Phone: [phone] Fax: [phone]  [facility] [place], [place] - [id number] Nicole Dean [name] [place] [address] Phone: [phone] Fax: [phone]    Your procedure is scheduled on Tuesday, August 22nd, 2017.  [facility] Ad[facility] at 5:30 A.M.   Call this number if you have problems the morning of surgery:  [phone]   Remember:  Do not eat food or drink liquids after midnight.   Take these medicines the morning of surgery with A SIP OF WATER: Aspirin, Buspirone (Buspar), Carvedilol (Coreg), Sertraline (Zoloft), Tramadol (Ultram) if needed.  Stop taking: Naproxen, Aleve, Ibuprofen, Advil, Motrin, BC's, Goody's, Fish oil, all herbal medications, and all vitamins.     Do not wear jewelry, make-up or nail polish.  Do not wear lotions, powders, or perfumes.  You may NOT wear deoderant.  Do not shave 48 hours prior to surgery.    Do not bring valuables to the hospital.  [facility] is not responsible for any belongings or valuables.  Contacts, dentures or bridgework may not be worn into surgery.  Leave your suitcase in the car.  After surgery it may be brought to your room.  For patients admitted to the hospital, discharge time will be determined by your treatment team.  Patients discharged the day of surgery will not be allowed to drive home.   Special instructions:  Preparing for Surgery.   Please read over the following fact sheets that you were given. MRSA Information    - Preparing For Surgery  Before surgery, you can play an important role. Because skin is not sterile, your skin  needs to be as free of germs as possible. You can reduce the number of germs on your skin by washing with CHG (chlorahexidine gluconate) Soap before surgery.  CHG is an antiseptic cleaner which kills germs and bonds with the skin to continue killing germs even after washing.  Please do not use if you have an allergy to CHG or antibacterial soaps. If your skin becomes reddened/irritated stop using the CHG.  Do not shave (including legs and underarms) for at least 48 hours prior to first CHG shower. It is OK to shave your face.  Please follow these instructions carefully.   1. Shower the NIGHT BEFORE SURGERY and the MORNING OF SURGERY with CHG.   2. If you chose to wash your hair, wash your hair first as usual with your normal shampoo.  3. After you shampoo, rinse your hair and body thoroughly to remove the shampoo.  4. Use CHG as you would any other liquid soap. You can apply CHG directly to the skin and wash gently with a scrungie or a clean washcloth.   5. Apply the CHG Soap to your body ONLY FROM THE NECK DOWN.  Do not use on open wounds or open sores. Avoid contact with your eyes, ears, mouth and genitals (private parts). Wash genitals (private parts) with your normal soap.  6. Wash thoroughly, paying special attention to the area where your surgery will be performed.  7. Thoroughly rinse your body with warm water from the neck down.  8. DO NOT shower/wash with  your normal soap after using and rinsing off the CHG Soap.  9. Pat yourself dry with a CLEAN TOWEL.   10. Wear CLEAN PAJAMAS   11. Place CLEAN SHEETS on your bed the night of your first shower and DO NOT SLEEP WITH PETS.  Day of Surgery: Do not apply any deodorants/lotions. Please wear clean clothes to the hospital/surgery center.

## 2016-07-16 ENCOUNTER — Encounter (HOSPITAL_COMMUNITY): Payer: Self-pay

## 2016-07-16 ENCOUNTER — Encounter (HOSPITAL_COMMUNITY)
Admission: RE | Admit: 2016-07-16 | Discharge: 2016-07-16 | Disposition: A | Discharge status: Home / Self Care | Payer: Medicare HMO | Source: Ambulatory Visit | Attending: Vascular Surgery | Admitting: Vascular Surgery

## 2016-07-16 DIAGNOSIS — Z01812 Encounter for preprocedural laboratory examination: Secondary | ICD-10-CM | POA: Diagnosis not present

## 2016-07-16 DIAGNOSIS — Z0183 Encounter for blood typing: Secondary | ICD-10-CM | POA: Diagnosis not present

## 2016-07-16 DIAGNOSIS — F329 Major depressive disorder, single episode, unspecified: Secondary | ICD-10-CM | POA: Insufficient documentation

## 2016-07-16 DIAGNOSIS — I2510 Atherosclerotic heart disease of native coronary artery without angina pectoris: Secondary | ICD-10-CM | POA: Insufficient documentation

## 2016-07-16 DIAGNOSIS — E785 Hyperlipidemia, unspecified: Secondary | ICD-10-CM | POA: Insufficient documentation

## 2016-07-16 DIAGNOSIS — I714 Abdominal aortic aneurysm, without rupture: Secondary | ICD-10-CM | POA: Insufficient documentation

## 2016-07-16 DIAGNOSIS — Z01818 Encounter for other preprocedural examination: Secondary | ICD-10-CM | POA: Diagnosis present

## 2016-07-16 DIAGNOSIS — Z951 Presence of aortocoronary bypass graft: Secondary | ICD-10-CM | POA: Insufficient documentation

## 2016-07-16 DIAGNOSIS — F419 Anxiety disorder, unspecified: Secondary | ICD-10-CM | POA: Insufficient documentation

## 2016-07-16 DIAGNOSIS — I10 Essential (primary) hypertension: Secondary | ICD-10-CM | POA: Diagnosis not present

## 2016-07-16 DIAGNOSIS — Z7982 Long term (current) use of aspirin: Secondary | ICD-10-CM | POA: Diagnosis not present

## 2016-07-16 DIAGNOSIS — Z87891 Personal history of nicotine dependence: Secondary | ICD-10-CM | POA: Diagnosis not present

## 2016-07-16 DIAGNOSIS — Z79899 Other long term (current) drug therapy: Secondary | ICD-10-CM | POA: Insufficient documentation

## 2016-07-16 DIAGNOSIS — K219 Gastro-esophageal reflux disease without esophagitis: Secondary | ICD-10-CM | POA: Diagnosis not present

## 2016-07-16 HISTORY — DX: Presence of spectacles and contact lenses: Z97.3

## 2016-07-16 HISTORY — DX: Anesthesia of skin: R20.2

## 2016-07-16 HISTORY — DX: Personal history of other diseases of the respiratory system: Z87.09

## 2016-07-16 HISTORY — DX: Anemia, unspecified: D64.9

## 2016-07-16 HISTORY — DX: Personal history of pneumonia (recurrent): Z87.01

## 2016-07-16 HISTORY — DX: Paresthesia of skin: R20.0

## 2016-07-16 LAB — COMPREHENSIVE METABOLIC PANEL
ALBUMIN: 3.6 g/dL (ref 3.5–5.0)
ALK PHOS: 82 U/L (ref 38–126)
ALT: 13 U/L — ABNORMAL LOW (ref 14–54)
ANION GAP: 8 (ref 5–15)
AST: 20 U/L (ref 15–41)
BUN: 10 mg/dL (ref 6–20)
CHLORIDE: 104 mmol/L (ref 101–111)
CO2: 27 mmol/L (ref 22–32)
Calcium: 9.3 mg/dL (ref 8.9–10.3)
Creatinine, Ser: 0.66 mg/dL (ref 0.44–1.00)
GFR calc non Af Amer: >60 mL/min (ref >60)
GLUCOSE: 100 mg/dL — AB (ref 65–99)
POTASSIUM: 4.1 mmol/L (ref 3.5–5.1)
SODIUM: 139 mmol/L (ref 135–145)
Total Bilirubin: 0.6 mg/dL (ref 0.3–1.2)
Total Protein: 6.3 g/dL — ABNORMAL LOW (ref 6.5–8.1)

## 2016-07-16 LAB — URINALYSIS, ROUTINE W REFLEX MICROSCOPIC
Glucose, UA: NEGATIVE mg/dL
Hgb urine dipstick: NEGATIVE
Ketones, ur: 15 mg/dL — AB
NITRITE: NEGATIVE
PROTEIN: NEGATIVE mg/dL
SPECIFIC GRAVITY, URINE: 1.03 (ref 1.005–1.030)
pH: 5.5 (ref 5.0–8.0)

## 2016-07-16 LAB — PROTIME-INR
INR: 1.03
Prothrombin Time: 13.5 seconds (ref 11.4–15.2)

## 2016-07-16 LAB — CBC
HCT: 39.7 % (ref 36.0–46.0)
HEMOGLOBIN: 12.9 g/dL (ref 12.0–15.0)
MCH: 30.4 pg (ref 26.0–34.0)
MCHC: 32.5 g/dL (ref 30.0–36.0)
MCV: 93.4 fL (ref 78.0–100.0)
PLATELETS: 276 10*3/uL (ref 150–400)
RBC: 4.25 MIL/uL (ref 3.87–5.11)
RDW: 14.4 % (ref 11.5–15.5)
WBC: 7.3 10*3/uL (ref 4.0–10.5)

## 2016-07-16 LAB — APTT: APTT: 37 s — AB (ref 24–36)

## 2016-07-16 LAB — SURGICAL PCR SCREEN
MRSA, PCR: NEGATIVE
STAPHYLOCOCCUS AUREUS: NEGATIVE

## 2016-07-16 LAB — URINE MICROSCOPIC-ADD ON: RBC / HPF: NONE SEEN RBC/hpf (ref 0–5)

## 2016-07-16 NOTE — Progress Notes (Signed)
[facility] Cardiologist - Dr. Melburn PopperNasher  EKG - 04/29/16 CXR - 05/2016 Echo - 03/2016 Stress test - 03/2016 Cath - 03/2016  Patient denies chest pain and shortness of breath at PAT appointment.

## 2016-07-16 NOTE — Progress Notes (Signed)
Multiple attempts for blood gas at PAT appointment.  Will need to collect DOS.

## 2016-07-16 NOTE — Progress Notes (Signed)
Anesthesia Chart Review: Patient is a 75 year old female scheduled for AAA repair on 07/22/16 by Dr. Edilia Boickson.  History includes CAD s/p CABG X 4 (LIMA-LAD, RIMA-PDA, SVG-DIAG, SVG-OM) 04/04/16, 6.1 cm AAA, former smoker, HTN, HLD, GERD, anxiety, depression, anemia.  - PCP is with [facility], last visit Charlies SilversJennifer Couillard, PA-C 06/27/16. - CT surgeon is Dr. Laneta SimmersBartle, last visit 05/10/16 with overall recommendation to schedule AAA repair 3 months after CABG to allow more recovery time. - Cardiologist is Dr. Elease HashimotoNahser, last visit 06/04/16. He agreed with plans for AAA repair 3 months after CABG.   Meds include aspirin 325 mg, Lipitor, BuSpar, Coreg, ferrous sulfate, HCTZ, Prilosec, KCl, Zoloft, tramadol, trazodone.  BP (!) 107/52   Pulse 68   Temp 36.9 C   Ht 5\' 2"  (1.575 m)   Wt 138 lb 9.6 oz (62.9 kg)   SpO2 95%   BMI 25.35 kg/m   04/29/16 EKG: NSR, septal infarct (age undetermined).  Last cath (03/31/16) was pre-CABG and showed 3V CAD with normal LVF. (Cath was done due to abnormal stress test which had been ordered in anticipation of open AAA repair.)  04/02/16 Carotid U/S: Summary: - Bilateral - 1% to 39% ICA stenosis. Vertebral artery flow is   antegrade.  04/08/16 CTA chest/abd/pelvis: IMPRESSION: - Evidence of patient's recent median sternotomy/CABG. Nonspecific linear fluid collection in the substernal region just right of midline measuring 1.3 x 2.3 x 12.7 cm containing a small collection of air along its superior aspect. This may represent an infected versus noninfected fluid collection. - No evidence of aortic dissection over the thoracoabdominal aorta. - Stable infrarenal abdominal aortic aneurysm measuring 5.7 x 6.1 cm in transverse and AP diameter. - Focal stenosis at the takeoff of the celiac axis and superior mesenteric arteries as well as at the origin of the right common iliac artery. - Small to moderate bilateral pleural effusions with associated bibasilar compressive  atelectasis. - Mild cholelithiasis. Small right renal cysts. Colonic diverticulosis. Small left inguinal hernia.  05/07/16 CXR: IMPRESSION: 1. Increase in volume of small right pleural effusion with right basilar atelectasis. 2. Tiny left pleural effusion.  04/02/16 PFTs: FVC 1.99 (83% pre, 93% post), FEV1 1.25 (70% pre, 82% post), DLCOunc 11.48 (60%).   Preoperative labs noted. Cr 0.66. Glucose 100. CBC, PT/INR WNL. PTT 37. T&S done. UA was cloudy with small leukocytes, negative nitrites. Unable to get ABG at PAT, so it will need to be done on arrival day of surgery.  If no acute changes then I anticipate that she can proceed as planned.  Velna Ochsllison Zelenak, PA-C [facility]iology Phone [phone] 07/16/2016 2:25 PM

## 2016-07-21 MED ORDER — VANCOMYCIN HCL IN DEXTROSE 1-5 GM/200ML-% IV SOLN
1000.0000 mg | INTRAVENOUS | Status: AC
  Administered 2016-07-22: 1000 mg via INTRAVENOUS
  Filled 2016-07-21: qty 200

## 2016-07-21 MED ORDER — SODIUM CHLORIDE 0.9 % IV SOLN
INTRAVENOUS | Status: DC
  Administered 2016-07-22: 11:00:00 via INTRAVENOUS

## 2016-07-22 ENCOUNTER — Inpatient Hospital Stay (HOSPITAL_COMMUNITY): Payer: Medicare HMO

## 2016-07-22 ENCOUNTER — Encounter (HOSPITAL_COMMUNITY)
Admission: RE | Disposition: A | Discharge status: Home / Self Care | Payer: Self-pay | Source: Ambulatory Visit | Attending: Vascular Surgery

## 2016-07-22 ENCOUNTER — Inpatient Hospital Stay (HOSPITAL_COMMUNITY): Payer: Medicare HMO | Admitting: Anesthesiology

## 2016-07-22 ENCOUNTER — Inpatient Hospital Stay (HOSPITAL_COMMUNITY)
Admission: RE | Admit: 2016-07-22 | Discharge: 2016-08-01 | DRG: 269 | Disposition: A | Discharge status: Home / Self Care | Payer: Medicare HMO | Source: Ambulatory Visit | Attending: Vascular Surgery | Admitting: Vascular Surgery

## 2016-07-22 ENCOUNTER — Encounter (HOSPITAL_COMMUNITY): Payer: Self-pay | Admitting: *Deleted

## 2016-07-22 DIAGNOSIS — I714 Abdominal aortic aneurysm, without rupture, unspecified: Secondary | ICD-10-CM | POA: Diagnosis present

## 2016-07-22 DIAGNOSIS — Z7982 Long term (current) use of aspirin: Secondary | ICD-10-CM

## 2016-07-22 DIAGNOSIS — Z87891 Personal history of nicotine dependence: Secondary | ICD-10-CM

## 2016-07-22 DIAGNOSIS — R110 Nausea: Secondary | ICD-10-CM | POA: Diagnosis present

## 2016-07-22 DIAGNOSIS — Z951 Presence of aortocoronary bypass graft: Secondary | ICD-10-CM | POA: Diagnosis not present

## 2016-07-22 DIAGNOSIS — R109 Unspecified abdominal pain: Secondary | ICD-10-CM | POA: Diagnosis present

## 2016-07-22 DIAGNOSIS — I25810 Atherosclerosis of coronary artery bypass graft(s) without angina pectoris: Secondary | ICD-10-CM | POA: Diagnosis not present

## 2016-07-22 DIAGNOSIS — F329 Major depressive disorder, single episode, unspecified: Secondary | ICD-10-CM | POA: Diagnosis present

## 2016-07-22 DIAGNOSIS — I7409 Other arterial embolism and thrombosis of abdominal aorta: Secondary | ICD-10-CM | POA: Diagnosis present

## 2016-07-22 DIAGNOSIS — R000 Tachycardia, unspecified: Secondary | ICD-10-CM | POA: Diagnosis present

## 2016-07-22 DIAGNOSIS — Z8679 Personal history of other diseases of the circulatory system: Secondary | ICD-10-CM

## 2016-07-22 DIAGNOSIS — I2510 Atherosclerotic heart disease of native coronary artery without angina pectoris: Secondary | ICD-10-CM | POA: Diagnosis present

## 2016-07-22 DIAGNOSIS — I959 Hypotension, unspecified: Secondary | ICD-10-CM | POA: Diagnosis not present

## 2016-07-22 DIAGNOSIS — Z880 Allergy status to penicillin: Secondary | ICD-10-CM

## 2016-07-22 DIAGNOSIS — Z79899 Other long term (current) drug therapy: Secondary | ICD-10-CM

## 2016-07-22 DIAGNOSIS — J9811 Atelectasis: Secondary | ICD-10-CM | POA: Diagnosis not present

## 2016-07-22 DIAGNOSIS — J90 Pleural effusion, not elsewhere classified: Secondary | ICD-10-CM | POA: Diagnosis present

## 2016-07-22 DIAGNOSIS — F411 Generalized anxiety disorder: Secondary | ICD-10-CM | POA: Diagnosis present

## 2016-07-22 DIAGNOSIS — Z885 Allergy status to narcotic agent status: Secondary | ICD-10-CM

## 2016-07-22 DIAGNOSIS — I739 Peripheral vascular disease, unspecified: Secondary | ICD-10-CM | POA: Diagnosis present

## 2016-07-22 DIAGNOSIS — K219 Gastro-esophageal reflux disease without esophagitis: Secondary | ICD-10-CM | POA: Diagnosis present

## 2016-07-22 DIAGNOSIS — I10 Essential (primary) hypertension: Secondary | ICD-10-CM | POA: Diagnosis present

## 2016-07-22 DIAGNOSIS — I70213 Atherosclerosis of native arteries of extremities with intermittent claudication, bilateral legs: Secondary | ICD-10-CM

## 2016-07-22 DIAGNOSIS — E871 Hypo-osmolality and hyponatremia: Secondary | ICD-10-CM

## 2016-07-22 DIAGNOSIS — R0602 Shortness of breath: Secondary | ICD-10-CM

## 2016-07-22 DIAGNOSIS — F32A Depression, unspecified: Secondary | ICD-10-CM

## 2016-07-22 DIAGNOSIS — Z9841 Cataract extraction status, right eye: Secondary | ICD-10-CM

## 2016-07-22 DIAGNOSIS — E785 Hyperlipidemia, unspecified: Secondary | ICD-10-CM | POA: Diagnosis present

## 2016-07-22 DIAGNOSIS — N390 Urinary tract infection, site not specified: Secondary | ICD-10-CM | POA: Diagnosis not present

## 2016-07-22 DIAGNOSIS — I722 Aneurysm of renal artery: Secondary | ICD-10-CM

## 2016-07-22 DIAGNOSIS — R197 Diarrhea, unspecified: Secondary | ICD-10-CM | POA: Diagnosis not present

## 2016-07-22 DIAGNOSIS — Z961 Presence of intraocular lens: Secondary | ICD-10-CM

## 2016-07-22 DIAGNOSIS — Z9842 Cataract extraction status, left eye: Secondary | ICD-10-CM

## 2016-07-22 DIAGNOSIS — R0682 Tachypnea, not elsewhere classified: Secondary | ICD-10-CM

## 2016-07-22 DIAGNOSIS — D72829 Elevated white blood cell count, unspecified: Secondary | ICD-10-CM

## 2016-07-22 DIAGNOSIS — D62 Acute posthemorrhagic anemia: Secondary | ICD-10-CM

## 2016-07-22 DIAGNOSIS — Z9889 Other specified postprocedural states: Secondary | ICD-10-CM

## 2016-07-22 HISTORY — PX: AORTA - BILATERAL FEMORAL ARTERY BYPASS GRAFT: SHX1175

## 2016-07-22 HISTORY — PX: REPAIR ILIAC ARTERY: SHX6216

## 2016-07-22 HISTORY — PX: ABDOMINAL AORTIC ANEURYSM REPAIR: SHX42

## 2016-07-22 LAB — APTT: aPTT: 35 seconds (ref 24–36)

## 2016-07-22 LAB — POCT I-STAT 3, ART BLOOD GAS (G3+)
BICARBONATE: 26.6 meq/L — AB (ref 20.0–24.0)
O2 Saturation: 94 %
PH ART: 7.336 — AB (ref 7.350–7.450)
PO2 ART: 73 mmHg — AB (ref 80.0–100.0)
Patient temperature: 98
TCO2: 28 mmol/L (ref 0–100)
pCO2 arterial: 49.5 mmHg — ABNORMAL HIGH (ref 35.0–45.0)

## 2016-07-22 LAB — BASIC METABOLIC PANEL
ANION GAP: 4 — AB (ref 5–15)
BUN: 12 mg/dL (ref 6–20)
CHLORIDE: 107 mmol/L (ref 101–111)
CO2: 27 mmol/L (ref 22–32)
CREATININE: 0.66 mg/dL (ref 0.44–1.00)
Calcium: 8 mg/dL — ABNORMAL LOW (ref 8.9–10.3)
GFR calc non Af Amer: >60 mL/min (ref >60)
Glucose, Bld: 177 mg/dL — ABNORMAL HIGH (ref 65–99)
POTASSIUM: 3.7 mmol/L (ref 3.5–5.1)
SODIUM: 138 mmol/L (ref 135–145)

## 2016-07-22 LAB — CBC
HCT: 33.5 % — ABNORMAL LOW (ref 36.0–46.0)
HEMOGLOBIN: 11.1 g/dL — AB (ref 12.0–15.0)
MCH: 30.6 pg (ref 26.0–34.0)
MCHC: 33.1 g/dL (ref 30.0–36.0)
MCV: 92.3 fL (ref 78.0–100.0)
Platelets: 214 10*3/uL (ref 150–400)
RBC: 3.63 MIL/uL — AB (ref 3.87–5.11)
RDW: 14.2 % (ref 11.5–15.5)
WBC: 13.6 10*3/uL — AB (ref 4.0–10.5)

## 2016-07-22 LAB — PROTIME-INR
INR: 1.34
PROTHROMBIN TIME: 16.7 s — AB (ref 11.4–15.2)

## 2016-07-22 LAB — BLOOD GAS, ARTERIAL
Acid-base deficit: 0.3 mmol/L (ref 0.0–2.0)
BICARBONATE: 25.9 meq/L — AB (ref 20.0–24.0)
O2 Content: 6 L/min
O2 SAT: 96.7 %
PATIENT TEMPERATURE: 98.6
PO2 ART: 104 mmHg — AB (ref 80.0–100.0)
TCO2: 27.7 mmol/L (ref 0–100)
pCO2 arterial: 59.4 mmHg (ref 35.0–45.0)
pH, Arterial: 7.263 — ABNORMAL LOW (ref 7.350–7.450)

## 2016-07-22 LAB — MAGNESIUM: Magnesium: 1.3 mg/dL — ABNORMAL LOW (ref 1.7–2.4)

## 2016-07-22 LAB — PREPARE RBC (CROSSMATCH)

## 2016-07-22 SURGERY — ANEURYSM ABDOMINAL AORTIC REPAIR
Anesthesia: General | Site: Abdomen | Laterality: Right

## 2016-07-22 MED ORDER — TRAZODONE HCL 100 MG PO TABS
100.0000 mg | ORAL_TABLET | Freq: Every day | ORAL | Status: DC
  Administered 2016-07-22 – 2016-07-31 (×8): 100 mg via NASOGASTRIC
  Filled 2016-07-22 (×5): qty 1
  Filled 2016-07-22: qty 2
  Filled 2016-07-22 (×3): qty 1

## 2016-07-22 MED ORDER — PHENYLEPHRINE 40 MCG/ML (10ML) SYRINGE FOR IV PUSH (FOR BLOOD PRESSURE SUPPORT)
PREFILLED_SYRINGE | INTRAVENOUS | Status: AC
  Filled 2016-07-22: qty 10

## 2016-07-22 MED ORDER — PHENOL 1.4 % MT LIQD: 1.0000 | OROMUCOSAL | Status: DC | PRN

## 2016-07-22 MED ORDER — BISACODYL 10 MG RE SUPP: 10.0000 mg | Freq: Every day | RECTAL | Status: DC | PRN

## 2016-07-22 MED ORDER — GUAIFENESIN-DM 100-10 MG/5ML PO SYRP: 15.0000 mL | ORAL_SOLUTION | ORAL | Status: DC | PRN

## 2016-07-22 MED ORDER — DEXAMETHASONE SODIUM PHOSPHATE 10 MG/ML IJ SOLN
INTRAMUSCULAR | Status: DC | PRN
  Administered 2016-07-22: 10 mg via INTRAVENOUS

## 2016-07-22 MED ORDER — ACETAMINOPHEN 325 MG RE SUPP: 325.0000 mg | RECTAL | Status: DC | PRN

## 2016-07-22 MED ORDER — CHLORHEXIDINE GLUCONATE CLOTH 2 % EX PADS: 6.0000 | MEDICATED_PAD | Freq: Once | CUTANEOUS | Status: DC

## 2016-07-22 MED ORDER — BUSPIRONE HCL 15 MG PO TABS
30.0000 mg | ORAL_TABLET | Freq: Two times a day (BID) | ORAL | Status: DC
  Administered 2016-07-22 – 2016-07-29 (×11): 30 mg via NASOGASTRIC
  Filled 2016-07-22 (×2): qty 2
  Filled 2016-07-22: qty 3
  Filled 2016-07-22: qty 2
  Filled 2016-07-22: qty 3
  Filled 2016-07-22 (×17): qty 2

## 2016-07-22 MED ORDER — ROCURONIUM BROMIDE 100 MG/10ML IV SOLN
INTRAVENOUS | Status: DC | PRN
  Administered 2016-07-22: 50 mg via INTRAVENOUS
  Administered 2016-07-22: 20 mg via INTRAVENOUS
  Administered 2016-07-22: 10 mg via INTRAVENOUS
  Administered 2016-07-22: 20 mg via INTRAVENOUS
  Administered 2016-07-22: 10 mg via INTRAVENOUS

## 2016-07-22 MED ORDER — HYDRALAZINE HCL 20 MG/ML IJ SOLN
INTRAMUSCULAR | Status: AC
  Filled 2016-07-22: qty 1

## 2016-07-22 MED ORDER — LACTATED RINGERS IV SOLN
INTRAVENOUS | Status: DC | PRN
  Administered 2016-07-22 (×2): via INTRAVENOUS

## 2016-07-22 MED ORDER — PNEUMOCOCCAL VAC POLYVALENT 25 MCG/0.5ML IJ INJ: 0.5000 mL | INJECTION | INTRAMUSCULAR | Status: DC | PRN

## 2016-07-22 MED ORDER — THROMBIN 20000 UNITS EX SOLR
CUTANEOUS | Status: AC
  Filled 2016-07-22: qty 20000

## 2016-07-22 MED ORDER — ALUM & MAG HYDROXIDE-SIMETH 200-200-20 MG/5ML PO SUSP
15.0000 mL | ORAL | Status: DC | PRN
  Administered 2016-07-27 – 2016-07-29 (×4): 30 mL via ORAL
  Filled 2016-07-22 (×5): qty 30

## 2016-07-22 MED ORDER — FENTANYL CITRATE (PF) 100 MCG/2ML IJ SOLN
INTRAMUSCULAR | Status: AC
  Filled 2016-07-22: qty 2

## 2016-07-22 MED ORDER — LABETALOL HCL 5 MG/ML IV SOLN
INTRAVENOUS | Status: DC | PRN
  Administered 2016-07-22: 5 mg via INTRAVENOUS
  Administered 2016-07-22: 10 mg via INTRAVENOUS
  Administered 2016-07-22: 5 mg via INTRAVENOUS

## 2016-07-22 MED ORDER — SUGAMMADEX SODIUM 200 MG/2ML IV SOLN
INTRAVENOUS | Status: DC | PRN
  Administered 2016-07-22: 200 mg via INTRAVENOUS

## 2016-07-22 MED ORDER — DIPHENHYDRAMINE HCL 50 MG/ML IJ SOLN
INTRAMUSCULAR | Status: DC | PRN
  Administered 2016-07-22: 12.5 mg via INTRAVENOUS

## 2016-07-22 MED ORDER — NITROGLYCERIN IN D5W 200-5 MCG/ML-% IV SOLN
INTRAVENOUS | Status: DC | PRN
  Administered 2016-07-22: 5 ug/min via INTRAVENOUS

## 2016-07-22 MED ORDER — OXYCODONE HCL 5 MG PO TABS: 5.0000 mg | ORAL_TABLET | Freq: Once | ORAL | Status: DC | PRN

## 2016-07-22 MED ORDER — MANNITOL 25 % IV SOLN
INTRAVENOUS | Status: DC | PRN
  Administered 2016-07-22: 25 g via INTRAVENOUS

## 2016-07-22 MED ORDER — PHENYLEPHRINE 40 MCG/ML (10ML) SYRINGE FOR IV PUSH (FOR BLOOD PRESSURE SUPPORT)
PREFILLED_SYRINGE | INTRAVENOUS | Status: AC
Start: 2016-07-22 — End: 2016-07-22
  Filled 2016-07-22: qty 10

## 2016-07-22 MED ORDER — FENTANYL CITRATE (PF) 100 MCG/2ML IJ SOLN
INTRAMUSCULAR | Status: DC | PRN
  Administered 2016-07-22 (×2): 50 ug via INTRAVENOUS
  Administered 2016-07-22: 150 ug via INTRAVENOUS
  Administered 2016-07-22 (×2): 50 ug via INTRAVENOUS
  Administered 2016-07-22: 150 ug via INTRAVENOUS
  Administered 2016-07-22: 50 ug via INTRAVENOUS
  Administered 2016-07-22: 100 ug via INTRAVENOUS
  Administered 2016-07-22 (×3): 50 ug via INTRAVENOUS
  Administered 2016-07-22: 100 ug via INTRAVENOUS
  Administered 2016-07-22 (×2): 50 ug via INTRAVENOUS
  Administered 2016-07-22 (×3): 100 ug via INTRAVENOUS

## 2016-07-22 MED ORDER — PROPOFOL 10 MG/ML IV BOLUS
INTRAVENOUS | Status: AC
  Filled 2016-07-22: qty 20

## 2016-07-22 MED ORDER — NITROGLYCERIN IN D5W 200-5 MCG/ML-% IV SOLN
5.0000 ug/min | INTRAVENOUS | Status: DC
  Filled 2016-07-22: qty 250

## 2016-07-22 MED ORDER — SODIUM BICARBONATE 8.4 % IV SOLN
INTRAVENOUS | Status: DC | PRN
  Administered 2016-07-22 (×2): 25 meq via INTRAVENOUS

## 2016-07-22 MED ORDER — ALBUMIN HUMAN 5 % IV SOLN
INTRAVENOUS | Status: DC | PRN
  Administered 2016-07-22 (×3): via INTRAVENOUS

## 2016-07-22 MED ORDER — MIDAZOLAM HCL 2 MG/2ML IJ SOLN
INTRAMUSCULAR | Status: AC
  Filled 2016-07-22: qty 2

## 2016-07-22 MED ORDER — PHENYLEPHRINE HCL 10 MG/ML IJ SOLN
INTRAMUSCULAR | Status: DC | PRN
  Administered 2016-07-22: 40 ug via INTRAVENOUS

## 2016-07-22 MED ORDER — DOCUSATE SODIUM 100 MG PO CAPS
100.0000 mg | ORAL_CAPSULE | Freq: Every day | ORAL | Status: DC
  Administered 2016-07-25 – 2016-07-27 (×3): 100 mg via ORAL
  Filled 2016-07-22 (×3): qty 1

## 2016-07-22 MED ORDER — HYDROMORPHONE HCL 1 MG/ML IJ SOLN: 0.2500 mg | INTRAMUSCULAR | Status: DC | PRN

## 2016-07-22 MED ORDER — LABETALOL HCL 5 MG/ML IV SOLN
10.0000 mg | INTRAVENOUS | Status: DC | PRN
  Administered 2016-07-22 (×3): 10 mg via INTRAVENOUS

## 2016-07-22 MED ORDER — HYDRALAZINE HCL 20 MG/ML IJ SOLN
5.0000 mg | INTRAMUSCULAR | Status: DC | PRN
Start: 2016-07-22 — End: 2016-08-01

## 2016-07-22 MED ORDER — ACETAMINOPHEN 325 MG PO TABS: 325.0000 mg | ORAL_TABLET | ORAL | Status: DC | PRN

## 2016-07-22 MED ORDER — PANTOPRAZOLE SODIUM 40 MG PO TBEC
40.0000 mg | DELAYED_RELEASE_TABLET | Freq: Every day | ORAL | Status: DC
  Administered 2016-07-23 – 2016-08-01 (×9): 40 mg via ORAL
  Filled 2016-07-22 (×10): qty 1

## 2016-07-22 MED ORDER — HEPARIN SODIUM (PORCINE) 1000 UNIT/ML IJ SOLN
INTRAMUSCULAR | Status: DC | PRN
  Administered 2016-07-22: 6000 [IU] via INTRAVENOUS
  Administered 2016-07-22: 2000 [IU] via INTRAVENOUS

## 2016-07-22 MED ORDER — POTASSIUM CHLORIDE CRYS ER 20 MEQ PO TBCR: 20.0000 meq | EXTENDED_RELEASE_TABLET | Freq: Every day | ORAL | Status: DC | PRN

## 2016-07-22 MED ORDER — OXYCODONE HCL 5 MG/5ML PO SOLN: 5.0000 mg | Freq: Once | ORAL | Status: DC | PRN

## 2016-07-22 MED ORDER — CALCIUM CHLORIDE 10 % IV SOLN
INTRAVENOUS | Status: AC
  Filled 2016-07-22: qty 10

## 2016-07-22 MED ORDER — PROPOFOL 10 MG/ML IV BOLUS
INTRAVENOUS | Status: DC | PRN
  Administered 2016-07-22: 100 mg via INTRAVENOUS
  Administered 2016-07-22 (×2): 50 mg via INTRAVENOUS
  Administered 2016-07-22: 30 mg via INTRAVENOUS
  Administered 2016-07-22 (×2): 50 mg via INTRAVENOUS

## 2016-07-22 MED ORDER — SODIUM CHLORIDE 0.9 % IV SOLN
INTRAVENOUS | Status: DC | PRN
  Administered 2016-07-22: 500 mL

## 2016-07-22 MED ORDER — SODIUM BICARBONATE 8.4 % IV SOLN
INTRAVENOUS | Status: AC
  Filled 2016-07-22: qty 50

## 2016-07-22 MED ORDER — LABETALOL HCL 5 MG/ML IV SOLN
INTRAVENOUS | Status: AC
  Filled 2016-07-22: qty 4

## 2016-07-22 MED ORDER — LIDOCAINE HCL (CARDIAC) 20 MG/ML IV SOLN
INTRAVENOUS | Status: DC | PRN
  Administered 2016-07-22: 60 mg via INTRAVENOUS

## 2016-07-22 MED ORDER — MORPHINE SULFATE (PF) 2 MG/ML IV SOLN
2.0000 mg | INTRAVENOUS | Status: DC | PRN
  Administered 2016-07-22 (×2): 2 mg via INTRAVENOUS
  Administered 2016-07-23 (×4): 4 mg via INTRAVENOUS
  Administered 2016-07-23: 2 mg via INTRAVENOUS
  Administered 2016-07-23 – 2016-07-24 (×4): 4 mg via INTRAVENOUS
  Administered 2016-07-24: 5 mg via INTRAVENOUS
  Administered 2016-07-24 (×2): 4 mg via INTRAVENOUS
  Administered 2016-07-24: 5 mg via INTRAVENOUS
  Administered 2016-07-24: 4 mg via INTRAVENOUS
  Administered 2016-07-24 – 2016-07-25 (×3): 5 mg via INTRAVENOUS
  Administered 2016-07-25 (×2): 4 mg via INTRAVENOUS
  Administered 2016-07-25: 5 mg via INTRAVENOUS
  Administered 2016-07-26: 4 mg via INTRAVENOUS
  Administered 2016-07-26 – 2016-07-29 (×5): 2 mg via INTRAVENOUS
  Filled 2016-07-22 (×2): qty 2
  Filled 2016-07-22: qty 1
  Filled 2016-07-22 (×4): qty 2
  Filled 2016-07-22 (×2): qty 3
  Filled 2016-07-22: qty 1
  Filled 2016-07-22 (×2): qty 2
  Filled 2016-07-22 (×2): qty 1
  Filled 2016-07-22: qty 2
  Filled 2016-07-22: qty 3
  Filled 2016-07-22: qty 2
  Filled 2016-07-22: qty 1
  Filled 2016-07-22: qty 2
  Filled 2016-07-22 (×2): qty 1
  Filled 2016-07-22: qty 2
  Filled 2016-07-22: qty 1
  Filled 2016-07-22 (×2): qty 2
  Filled 2016-07-22: qty 3
  Filled 2016-07-22: qty 2
  Filled 2016-07-22: qty 1
  Filled 2016-07-22: qty 3
  Filled 2016-07-22: qty 1
  Filled 2016-07-22: qty 3

## 2016-07-22 MED ORDER — FENTANYL CITRATE (PF) 100 MCG/2ML IJ SOLN
INTRAMUSCULAR | Status: AC
  Filled 2016-07-22: qty 4

## 2016-07-22 MED ORDER — CALCIUM CHLORIDE 10 % IV SOLN
INTRAVENOUS | Status: DC | PRN
  Administered 2016-07-22 (×2): 100 mg via INTRAVENOUS

## 2016-07-22 MED ORDER — SERTRALINE HCL 100 MG PO TABS
100.0000 mg | ORAL_TABLET | Freq: Every day | ORAL | Status: DC
  Administered 2016-07-23 – 2016-08-01 (×9): 100 mg via NASOGASTRIC
  Filled 2016-07-22 (×5): qty 1
  Filled 2016-07-22: qty 2
  Filled 2016-07-22 (×3): qty 1

## 2016-07-22 MED ORDER — FENTANYL CITRATE (PF) 100 MCG/2ML IJ SOLN
INTRAMUSCULAR | Status: AC
  Filled 2016-07-22: qty 6

## 2016-07-22 MED ORDER — ONDANSETRON HCL 4 MG/2ML IJ SOLN
INTRAMUSCULAR | Status: DC | PRN
  Administered 2016-07-22: 4 mg via INTRAVENOUS

## 2016-07-22 MED ORDER — MANNITOL 25 % IV SOLN
25.0000 g | INTRAVENOUS | Status: DC
  Filled 2016-07-22: qty 100

## 2016-07-22 MED ORDER — SODIUM CHLORIDE 0.9 % IV SOLN: 500.0000 mL | Freq: Once | INTRAVENOUS | Status: DC | PRN

## 2016-07-22 MED ORDER — HEMOSTATIC AGENTS (NO CHARGE) OPTIME
TOPICAL | Status: DC | PRN
  Administered 2016-07-22: 1 via TOPICAL

## 2016-07-22 MED ORDER — ONDANSETRON HCL 4 MG/2ML IJ SOLN: 4.0000 mg | Freq: Four times a day (QID) | INTRAMUSCULAR | Status: DC | PRN

## 2016-07-22 MED ORDER — METOPROLOL TARTRATE 5 MG/5ML IV SOLN
2.0000 mg | INTRAVENOUS | Status: DC | PRN
  Administered 2016-07-24: 2.5 mg via INTRAVENOUS
  Filled 2016-07-22: qty 5

## 2016-07-22 MED ORDER — KCL IN DEXTROSE-NACL 20-5-0.45 MEQ/L-%-% IV SOLN
INTRAVENOUS | Status: DC
  Administered 2016-07-22 – 2016-07-26 (×10): via INTRAVENOUS
  Filled 2016-07-22 (×11): qty 1000

## 2016-07-22 MED ORDER — PHENYLEPHRINE HCL 10 MG/ML IJ SOLN
INTRAVENOUS | Status: DC | PRN
  Administered 2016-07-22: 20 ug/min via INTRAVENOUS

## 2016-07-22 MED ORDER — VANCOMYCIN HCL IN DEXTROSE 1-5 GM/200ML-% IV SOLN
1000.0000 mg | Freq: Two times a day (BID) | INTRAVENOUS | Status: DC
  Filled 2016-07-22 (×2): qty 200

## 2016-07-22 MED ORDER — THROMBIN 20000 UNITS EX SOLR
CUTANEOUS | Status: DC | PRN
  Administered 2016-07-22: 12:00:00 via TOPICAL

## 2016-07-22 MED ORDER — VANCOMYCIN HCL 500 MG IV SOLR
500.0000 mg | Freq: Two times a day (BID) | INTRAVENOUS | Status: AC
  Administered 2016-07-22 – 2016-07-23 (×2): 500 mg via INTRAVENOUS
  Filled 2016-07-22 (×2): qty 500

## 2016-07-22 MED ORDER — MAGNESIUM SULFATE 2 GM/50ML IV SOLN
2.0000 g | Freq: Every day | INTRAVENOUS | Status: DC | PRN
  Filled 2016-07-22: qty 50

## 2016-07-22 MED ORDER — NITROGLYCERIN 0.2 MG/ML ON CALL CATH LAB
INTRAVENOUS | Status: DC | PRN
  Administered 2016-07-22: 80 ug via INTRAVENOUS
  Administered 2016-07-22: 120 ug via INTRAVENOUS
  Administered 2016-07-22: 80 ug via INTRAVENOUS
  Administered 2016-07-22: 240 ug via INTRAVENOUS
  Administered 2016-07-22 (×2): 120 ug via INTRAVENOUS

## 2016-07-22 MED ORDER — 0.9 % SODIUM CHLORIDE (POUR BTL) OPTIME
TOPICAL | Status: DC | PRN
  Administered 2016-07-22 (×2): 1000 mL

## 2016-07-22 MED ORDER — ENOXAPARIN SODIUM 40 MG/0.4ML ~~LOC~~ SOLN
40.0000 mg | SUBCUTANEOUS | Status: DC
  Administered 2016-07-23 – 2016-08-01 (×10): 40 mg via SUBCUTANEOUS
  Filled 2016-07-22 (×10): qty 0.4

## 2016-07-22 MED ORDER — LACTATED RINGERS IV SOLN
INTRAVENOUS | Status: DC | PRN
  Administered 2016-07-22 (×2): via INTRAVENOUS

## 2016-07-22 MED ORDER — SODIUM CHLORIDE 0.9 % IV SOLN: Freq: Once | INTRAVENOUS | Status: DC

## 2016-07-22 MED ORDER — ONDANSETRON HCL 4 MG/2ML IJ SOLN
4.0000 mg | Freq: Four times a day (QID) | INTRAMUSCULAR | Status: DC | PRN
  Administered 2016-07-22 – 2016-07-28 (×3): 4 mg via INTRAVENOUS
  Filled 2016-07-22 (×5): qty 2

## 2016-07-22 MED FILL — Sodium Chloride IV Soln 0.9%: INTRAVENOUS | Qty: 2000 | Status: AC

## 2016-07-22 MED FILL — Heparin Sodium (Porcine) Inj 1000 Unit/ML: INTRAMUSCULAR | Qty: 30 | Status: AC

## 2016-07-22 SURGICAL SUPPLY — 65 items
CANISTER SUCTION 2500CC (MISCELLANEOUS) ×4 IMPLANT
CANNULA VESSEL 3MM 2 BLNT TIP (CANNULA) ×4 IMPLANT
CATH EMB 3FR 80CM (CATHETERS) ×4 IMPLANT
CLIP TI LARGE 6 (CLIP) ×4 IMPLANT
CLIP TI MEDIUM 24 (CLIP) ×4 IMPLANT
CLIP TI WIDE RED SMALL 24 (CLIP) ×4 IMPLANT
DRSG COVADERM 4X14 (GAUZE/BANDAGES/DRESSINGS) ×4 IMPLANT
DRSG COVADERM 4X6 (GAUZE/BANDAGES/DRESSINGS) ×8 IMPLANT
ELECT BLADE 4.0 EZ CLEAN MEGAD (MISCELLANEOUS) ×4
ELECT BLADE 6.5 EXT (BLADE) IMPLANT
ELECT REM PT RETURN 9FT ADLT (ELECTROSURGICAL) ×4
ELECTRODE BLDE 4.0 EZ CLN MEGD (MISCELLANEOUS) ×2 IMPLANT
ELECTRODE REM PT RTRN 9FT ADLT (ELECTROSURGICAL) ×2 IMPLANT
FELT TEFLON 1X6 (MISCELLANEOUS) ×4 IMPLANT
GLOVE BIO SURGEON STRL SZ7.5 (GLOVE) ×8 IMPLANT
GLOVE BIOGEL PI IND STRL 6.5 (GLOVE) ×2 IMPLANT
GLOVE BIOGEL PI IND STRL 8 (GLOVE) ×2 IMPLANT
GLOVE BIOGEL PI INDICATOR 6.5 (GLOVE) ×2
GLOVE BIOGEL PI INDICATOR 8 (GLOVE) ×2
GLOVE SURG SS PI 6.5 STRL IVOR (GLOVE) ×4 IMPLANT
GLOVE SURG SS PI 7.0 STRL IVOR (GLOVE) ×4 IMPLANT
GOWN STRL REUS W/ TWL LRG LVL3 (GOWN DISPOSABLE) ×6 IMPLANT
GOWN STRL REUS W/ TWL XL LVL3 (GOWN DISPOSABLE) ×2 IMPLANT
GOWN STRL REUS W/TWL LRG LVL3 (GOWN DISPOSABLE) ×6
GOWN STRL REUS W/TWL XL LVL3 (GOWN DISPOSABLE) ×2
GRAFT HEMASHIELD 18X9MM (Vascular Products) ×4 IMPLANT
HEMOSTAT SPONGE AVITENE ULTRA (HEMOSTASIS) ×4 IMPLANT
INSERT FOGARTY 61MM (MISCELLANEOUS) ×8 IMPLANT
INSERT FOGARTY SM (MISCELLANEOUS) ×16 IMPLANT
KIT BASIN OR (CUSTOM PROCEDURE TRAY) ×4 IMPLANT
KIT ROOM TURNOVER OR (KITS) ×4 IMPLANT
LIQUID BAND (GAUZE/BANDAGES/DRESSINGS) ×12 IMPLANT
LOOP VESSEL MAXI BLUE (MISCELLANEOUS) ×12 IMPLANT
LOOP VESSEL MINI RED (MISCELLANEOUS) ×8 IMPLANT
NS IRRIG 1000ML POUR BTL (IV SOLUTION) ×8 IMPLANT
PACK AORTA (CUSTOM PROCEDURE TRAY) ×4 IMPLANT
PAD ARMBOARD 7.5X6 YLW CONV (MISCELLANEOUS) ×8 IMPLANT
RETAINER VISCERA MED (MISCELLANEOUS) ×4 IMPLANT
SPONGE LAP 18X18 X RAY DECT (DISPOSABLE) ×8 IMPLANT
SPONGE SURGIFOAM ABS GEL 100 (HEMOSTASIS) ×4 IMPLANT
STAPLER VISISTAT (STAPLE) IMPLANT
STOPCOCK 4 WAY LG BORE MALE ST (IV SETS) ×4 IMPLANT
SUT ETHIBOND 5 LR DA (SUTURE) IMPLANT
SUT PDS AB 1 TP1 54 (SUTURE) ×8 IMPLANT
SUT PROLENE 2 0 MH 48 (SUTURE) ×8 IMPLANT
SUT PROLENE 3 0 SH 48 (SUTURE) ×8 IMPLANT
SUT PROLENE 3 0 SH DA (SUTURE) ×12 IMPLANT
SUT PROLENE 3 0 SH1 36 (SUTURE) IMPLANT
SUT PROLENE 5 0 C 1 24 (SUTURE) ×8 IMPLANT
SUT PROLENE 5 0 C 1 36 (SUTURE) ×8 IMPLANT
SUT PROLENE 6 0 C 1 24 (SUTURE) ×8 IMPLANT
SUT SILK 2 0 (SUTURE) ×2
SUT SILK 2 0 SH CR/8 (SUTURE) ×4 IMPLANT
SUT SILK 2 0SH CR/8 30 (SUTURE) ×4 IMPLANT
SUT SILK 2-0 18XBRD TIE 12 (SUTURE) ×2 IMPLANT
SUT SILK 3 0 (SUTURE) ×2
SUT SILK 3-0 18XBRD TIE 12 (SUTURE) ×2 IMPLANT
SUT VIC AB 2-0 CTB1 (SUTURE) ×12 IMPLANT
SUT VIC AB 3-0 SH 27 (SUTURE) ×4
SUT VIC AB 3-0 SH 27X BRD (SUTURE) ×4 IMPLANT
SUT VIC AB 4-0 PS2 27 (SUTURE) ×16 IMPLANT
SYR TB 1ML LUER SLIP (SYRINGE) ×4 IMPLANT
TOWEL BLUE STERILE X RAY DET (MISCELLANEOUS) ×8 IMPLANT
TRAY FOLEY W/METER SILVER 16FR (SET/KITS/TRAYS/PACK) ×4 IMPLANT
WATER STERILE IRR 1000ML POUR (IV SOLUTION) ×8 IMPLANT

## 2016-07-22 NOTE — Progress Notes (Signed)
[age] YOF s/p AAA repair, on vancomycin for surgical prophylaxis d/t penicillin allergy, pharmacy to adjust dosage base one renal function. Scr 0.66, est. crcl ~ 55 ml/min. Received pre-op dose 1g vancomycin at ~ 0720.   Plan: Vancomycin 500 mg Q 12 hrs x 2 doses next dose at 1930. Pharmacy sign off.  Thanks.   Bayard HuggerMei Treyce Spillers, PharmD, BCPS  Clinical Pharmacist  Pager: [phone]

## 2016-07-22 NOTE — Transfer of Care (Signed)
Immediate Anesthesia Transfer of Care Note  Patient: Talitha GivensJanice D Lafont  Procedure(s) Performed: Procedure(s): JUXTARENAL  ABDOMINAL AORTIC ANEURYSM REPAIR (N/A) AORTA BIFEMORAL BYPASS GRAFT (N/A) RIGHT COMMON ILIAC ANEURYSM REPAIR (Right)  Patient Location: PACU  Anesthesia Type:General  Level of Consciousness: awake  Airway & Oxygen Therapy: Patient Spontanous Breathing and Patient connected to face mask oxygen  Post-op Assessment: Report given to RN, Post -op Vital signs reviewed and stable and Patient moving all extremities X 4  Post vital signs: Reviewed and stable  Last Vitals:  Vitals:   07/22/16 0604  BP: (!) 148/79  Pulse: 74  Resp: 16  Temp: 36.9 C    Last Pain:  Vitals:   07/22/16 0604  TempSrc: Oral         Complications: No apparent anesthesia complications

## 2016-07-22 NOTE — Anesthesia Procedure Notes (Signed)
Procedure Name: Intubation Date/Time: 07/22/2016 7:39 AM Performed by: Little IshikawaMERCER, Ashonti Leandro L Pre-anesthesia Checklist: Patient identified, Emergency Drugs available, Suction available and Patient being monitored Patient Re-evaluated:Patient Re-evaluated prior to inductionOxygen Delivery Method: Circle System Utilized Preoxygenation: Pre-oxygenation with 100% oxygen Intubation Type: IV induction Ventilation: Oral airway inserted - appropriate to patient size Laryngoscope Size: Miller and 2 Grade View: Grade I Tube type: Subglottic suction tube Tube size: 7.5 mm Number of attempts: 1 Airway Equipment and Method: Stylet and Oral airway Placement Confirmation: ETT inserted through vocal cords under direct vision,  positive ETCO2 and breath sounds checked- equal and bilateral Secured at: 20 cm Tube secured with: Tape Dental Injury: Teeth and Oropharynx as per pre-operative assessment

## 2016-07-22 NOTE — Progress Notes (Signed)
Dr. Edilia Boickson paged with patient's ABG, pO2 73, MD acknowledged, no interventions at this time, will continue to monitor. Darrel HooverWilson,Angelia Hazell S

## 2016-07-22 NOTE — Interval H&P Note (Signed)
History and Physical Interval Note:  07/22/2016 7:21 AM  Talitha GivensJanice D Stankovich  has presented today for surgery, with the diagnosis of abdominal aortic aneurysm  The various methods of treatment have been discussed with the patient and family. After consideration of risks, benefits and other options for treatment, the patient has consented to  Procedure(s): ANEURYSM ABDOMINAL AORTIC REPAIR (N/A) as a surgical intervention .  The patient's history has been reviewed, patient examined, no change in status, stable for surgery.  I have reviewed the patient's chart and labs.  Questions were answered to the patient's satisfaction.     Waverly Ferrariickson, Brenton Joines

## 2016-07-22 NOTE — H&P (View-Only) (Signed)
[facility]  Patient name: Nicole Dean MRN: [id number] DOB: 26-Nov-1941 Sex: female  REASON FOR VISIT: To discuss open repair of abdominal aortic aneurysm.  HPI: Nicole Dean is a 75 y.o. female, who I last saw on 03/12/2016. I originally saw her back in 2015 with a 5.8 cm infrarenal abdominal aortic aneurysm. I recommended elective repair however she did not wish to proceed with repair at that time. The aneurysm was followed by her primary care physician and that it is enlarged to 6.0 cm. She was then agreeable to consider elective repair. 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 The aneurysm also extends up near the renal arteries. For these reasons she was not a candidate for endovascular repair of her aneurysm. Of note, she also has a circumaortic left renal vein.   She underwent preoperative cardiac evaluation and was found to have significant coronary artery disease. She required coronary revascularization by Dr. Laneta Simmers. She had a CABG 4 on 04/04/2016. It was felt that she should wait 3 months after her CABG before considering aneurysm repair. She has also been seen by Dr. Elease Hashimoto and has been cleared for open repair of her aneurysm.  She has done well since her heart surgery and denies any chest pain or chest pressure. She has been gradually resuming her normal activities. She denies abdominal pain or back pain.  She does have bilateral lower extremity claudication which is more significant on the right side. This involves her calves and thighs bilaterally. Her pain is brought on by ambulation and relieved with rest. She denies any history of rest pain.  She is on aspirin and is on a statin.  Past Medical History:  Diagnosis Date  . AAA (abdominal aortic aneurysm) (HCC)    VVS- following   . Anxiety   . Arthritis    hands  . Coronary artery disease   . Depression   . GERD (gastroesophageal reflux  disease)   . Hyperlipidemia   . Hypertension   . Shortness of breath dyspnea    sometimes even with resting     Family History  Problem Relation Age of Onset  . Cancer Father     stomach    SOCIAL HISTORY: She quit tobacco in 1997. Social History   Social History  . Marital status: Divorced    Spouse name: N/A  . Number of children: N/A  . Years of education: N/A   Occupational History  . Not on file.   Social History Main Topics  . Smoking status: Former Smoker    Packs/day: 1.50    Years: 35.00    Types: Cigarettes    Quit date: 02/15/1996  . Smokeless tobacco: Never Used  . Alcohol use No  . Drug use: No  . Sexual activity: Not on file   Other Topics Concern  . Not on file   Social History Narrative  . No narrative on file    Allergies  Allergen Reactions  . Codeine Nausea And Vomiting    Severe, ended up in ED  . Penicillins Nausea Only    Has patient had a PCN reaction causing immediate rash, facial/tongue/throat swelling, SOB or lightheadedness with hypotension: Yes Has patient had a PCN reaction causing severe rash involving mucus membranes or skin necrosis: No Has patient had a PCN reaction that required hospitalization No Has patient had a PCN reaction occurring within the last 10 years: No If all  of the above answers are "NO", then may proceed with Cephalosporin use.     Current Outpatient Prescriptions  Medication Sig Dispense Refill  . aspirin EC 325 MG EC tablet Take 1 tablet (325 mg total) by mouth daily. 30 tablet 0  . busPIRone (BUSPAR) 30 MG tablet Take 30 mg by mouth 2 (two) times daily. Reported on 04/01/2016    . carvedilol (COREG) 6.25 MG tablet Take 1 tablet (6.25 mg total) by mouth 2 (two) times daily. 60 tablet 11  . ferrous fumarate-b12-vitamic C-folic acid (TRINSICON / FOLTRIN) capsule Take 1 capsule by mouth every morning. 30 capsule 1  . hydrochlorothiazide (HYDRODIURIL) 25 MG tablet Take 25 mg by mouth daily.  1  . Multiple  Vitamins-Iron (MULTIVITAMIN/IRON PO) Take 1 tablet by mouth daily.    Marland Kitchen. omeprazole (PRILOSEC) 40 MG capsule Take 40 mg by mouth at bedtime.     . potassium chloride (K-DUR) 10 MEQ tablet Take 1 tablet (10 mEq total) by mouth daily. 30 tablet 11  . sertraline (ZOLOFT) 100 MG tablet Take 100 mg by mouth daily with breakfast.     . traMADol (ULTRAM) 50 MG tablet Take 1-2 tablets (50-100 mg total) by mouth every 4 (four) hours as needed for moderate pain. 30 tablet 0  . traZODone (DESYREL) 100 MG tablet Take 100 mg by mouth at bedtime.    Marland Kitchen. atorvastatin (LIPITOR) 40 MG tablet Take 1 tablet (40 mg total) by mouth daily. (Patient not taking: Reported on 07/02/2016) 30 tablet 11   No current facility-administered medications for this visit.     REVIEW OF SYSTEMS:  [X]  denotes positive finding, [ ]  denotes negative finding Cardiac  Comments:  Chest pain or chest pressure:    Shortness of breath upon exertion:    Short of breath when lying flat:    Irregular heart rhythm:        Vascular    Pain in calf, thigh, or hip brought on by ambulation: X Bilateral lower extremity claudication which is significantly worse on the right side.  Pain in feet at night that wakes you up from your sleep:     Blood clot in your veins:    Leg swelling:         Pulmonary    Oxygen at home:    Productive cough:     Wheezing:         Neurologic    Sudden weakness in arms or legs:     Sudden numbness in arms or legs:     Sudden onset of difficulty speaking or slurred speech:    Temporary loss of vision in one eye:     Problems with dizziness:         Gastrointestinal    Blood in stool:     Vomited blood:         Genitourinary    Burning when urinating:     Blood in urine:        Psychiatric    Major depression:         Hematologic    Bleeding problems:    Problems with blood clotting too easily:        Skin    Rashes or ulcers:        Constitutional    Fever or chills:      PHYSICAL  EXAM: Vitals:   07/02/16 1353  BP: 124/68  Pulse: 73  Resp: 16  Temp: 97.8 F (36.6 C)  TempSrc: Oral  SpO2: 92%  Weight: 135 lb (61.2 kg)  Height: 5\' 2"  (1.575 m)    GENERAL: The patient is a well-nourished female, in no acute distress. The vital signs are documented above. CARDIAC: There is a regular rate and rhythm.  VASCULAR: I do not protect carotid bruits. She has a diminished but palpable left femoral pulse. I cannot palpate a right femoral pulse. I cannot palpate pedal pulses. PULMONARY: There is good air exchange bilaterally without wheezing or rales. ABDOMEN: Soft and non-tender with normal pitched bowel sounds. Her aneurysm is palpable and nontender. MUSCULOSKELETAL: There are no major deformities or cyanosis. NEUROLOGIC: No focal weakness or paresthesias are detected. SKIN: There are no ulcers or rashes noted. PSYCHIATRIC: The patient has a normal affect.  DATA:   CT ANGIOGRAM: I reviewed her CT angiogram that was done on 04/08/2016. This shows a 6.1 cm infrarenal abdominal aortic aneurysm. She has a focal stenosis at the takeoff of the celiac axis and also superior mesenteric artery as well as at the origin of the right common iliac artery.  ARTERIOGRAM:   MEDICAL ISSUES:  6 CM INFRARENAL ABDOMINAL AORTIC ANEURYSM: Given the size of her aneurysm, the risk of rupture is approximately 10-15% per year. I have recommended that we proceed with elective open repair. She is not a candidate for endovascular repair given that the aneurysm extends up near her renal arteries and there is no adequate neck. 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 The situation is further complicated by her circumaortic left renal vein. We have discussed the indications for aneurysm repair. I have explained that the risk of rupture without repair is approximately 5-10% per year. We have discussed the advantages and disadvantages of open versus  endovascular repair.  The patient wishes to proceed with open repair. I have discussed the potential complications of surgery, including but not limited to bleeding, renal failure, MI, wound healing problems, hernia, graft infection, embolization, or other unpredictable medical problems. I have explained that the risk of mortality or major morbidity is approximately 5%. All of the patients questions were answered and they are agreeable to proceed with surgery. Her surgery is scheduled for 07/22/2016.    Waverly Ferrariickson, Gaspare Netzel [facility] [facility] [phone]

## 2016-07-22 NOTE — Anesthesia Preprocedure Evaluation (Signed)
Anesthesia Evaluation  Patient identified by MRN, date of birth, ID band Patient awake    Reviewed: Allergy & Precautions, H&P , NPO status , Patient's Chart, lab work & pertinent test results  Airway Mallampati: II   Neck ROM: full    Dental   Pulmonary shortness of breath, former smoker,    breath sounds clear to auscultation       Cardiovascular hypertension, + CAD, + CABG and + Peripheral Vascular Disease   Rhythm:regular Rate:Normal  CABG 03/2016.  Normal LV fxn.   Neuro/Psych PSYCHIATRIC DISORDERS Anxiety Depression    GI/Hepatic GERD  ,  Endo/Other    Renal/GU      Musculoskeletal  (+) Arthritis ,   Abdominal   Peds  Hematology   Anesthesia Other Findings   Reproductive/Obstetrics                             Anesthesia Physical Anesthesia Plan  ASA: III  Anesthesia Plan: General   Post-op Pain Management:    Induction: Intravenous  Airway Management Planned: Oral ETT  Additional Equipment: Arterial line, CVP and Ultrasound Guidance Line Placement  Intra-op Plan:   Post-operative Plan: Possible Post-op intubation/ventilation and Extubation in OR  Informed Consent: I have reviewed the patients History and Physical, chart, labs and discussed the procedure including the risks, benefits and alternatives for the proposed anesthesia with the patient or authorized representative who has indicated his/her understanding and acceptance.     Plan Discussed with: CRNA, Anesthesiologist and Surgeon  Anesthesia Plan Comments:         Anesthesia Quick Evaluation

## 2016-07-22 NOTE — Op Note (Signed)
NAME: Talitha GivensJanice D Siddoway   MRN: [id number] DOB: 1941-07-12    DATE OF OPERATION: 07/22/2016  PREOP DIAGNOSIS: 6 cm  juxtarenal abdominal aortic aneurysm.  POSTOP DIAGNOSIS: Same  PROCEDURE: Open repair of juxtarenal abdominal aortic aneurysm  With aortobifemoral bypass graft (189  mm Dacron graft)  SURGEON: Di Kindlehristopher S. Edilia Boickson, MD, FACS  ASSIST: Fabienne Brunsharles Fields M.D., Karsten RoKim Trinh, [facility]  ANESTHESIA: Gen.   EBL: per anesthesia records  INDICATIONS: Talitha GivensJanice D Legere is a 75 y.o. female who has a 6 cm juxtarenal aneurysm. She was not a candidate for endovascular repair. She required recent CABG because of her preoperative cardiac evaluation demonstrated significant coronary artery disease. This situation was further complicated by her diffuse aortoiliac occlusive disease and a circum-aortic left renal vein.  FINDINGS: Good Doppler flow in both renal arteries at the completion. The proximal clamp was placed below the right renal and above the left renal artery. He had diffuse aortoiliac occlusive disease nd thus required aortobifemoral bypass grafting.  TECHNIQUE: The patient was taken to the operating room after monitoring lines were placed by anesthesia. The patient received a general anesthetic. The abdomen and groins were prepped and draped in usual sterile fashion. The abdomen was entered through a midline incision. Upon careful exploration no other intra-abdominal pathology was noted. The transverse colon was reflected superiorly and the small bowel reflected to the right. Direct perineal tissue was divided over the aorta and the duodenum separated from the proximal aorta. There was also an inflammatory component and a segment of small bowel was adherent but this was carefully dissected away. As allowed mobilization of the small bowel to the right. The dissection was carried up to the left renal vein which was controlled with a vessel loop. The patient also had a known posterior left renal vein  and this was identified and controlled at both ends with a blue vessel loop. Both renal arteries were controlled with vessel loops. I did ligate and divide the adrenal branch of the left renal artery to allow mobilization such that the clamp could be placed below the renal vein. It looked like the best segment to clamp was between the right and left renal arteries.  Dissection was continued distally onto the right common iliac artery down to the external iliac artery. The patient had diffuse calcific disease and was really no place to sew on the right side.  Likewise the left common iliac artery was dissected free and this also was markedly calcified and diseased and again, there was no good place to clamp. I therefore elected to do an aortofemoral bypass.  Using a 2 team approach and oblique incision was made in each groin and dissection carried down to the common femoral artery, deep femoral artery, and superficial femoral arteries. On the right side had dissected well onto the inguinal ligament to find a place on the external iliac artery to clamp. A tunnel was created from this incision up to the right common iliac artery and an umbilical tape was passed. The only soft spot on the right was fairly high up on the distal external iliac artery and proximal common femoral artery. On the left side there was diffuse calcific disease but there was a soft spot on the common femoral artery anteriorly. Again a retroperitoneal tunnel was created to the left common iliac artery and on umbilical tape passed. The patient was then heparinized and received 25 g of mannitol.  Clamps were then placed on both common iliac arteries.Marland Kitchen. Next  The proximal clamp was placed above the left renal artery but below  The right renal artery. The left renal artery was controlled with a vessel loop. The aneuryshen opened longitudinally and T off proximally leaving a long neck in order to avoid injury to the posterior left renal vein.  Lumbars were oversewn with 2-0 silk ties. This required extensive endarterectomy just in order to oversew the lumbars given the diffuse calcific disease. The aortic bifurcation was oversewn with a 2-0 Prolene suture. The iliac clamps were then released. The aneurysm was adherent somewhat to the inferior vena cava and therefore did not completely divide the posterior wall. However, I was able to clearly identify the posterior left renal vein and sew just below this level. A felt cuff was used. An 18 x 9 graft was selected and cut to appropriate length. It was sewn end-to-end to the juxtarenal aorta with a felt cuff. The proximal anastomosis was tested and was hemostatic. It was flushed and then aortic clamp just below the left renal artery establishing flow to the left renal artery. Next the right limb of the graft was brought to the previously created tunnel for anastomosis to the right common femoral artery. Likewise the left limb of the graft was brought to the tunnel for anastomosis to the left femoral artery.  On the right side, the superficial femoral and deep femoral arteries were controlled with vessel loops and the external iliac artery. The one soft spot was opened longitudinally. It was a small area of plaque which was tacked with a 6-0 Prolene. The right limb of the graft was cut the appropriate length, spatulated, and sewn into side to the common femoral artery using continuous 5-0 Prolene suture. Prior to completing this anastomosis the arteries were backbled and flushed. It was not good backbleeding from the superficial femoral artery so I did pass a #3 Fogarty catheter down below the knee without any clot retrieved. The anastomosis was completed and flow reestablished to the right leg which the patient tolerated from a hemodynamic standpoint.  Next attention was turned to the left groin. The external iliac artery  Was clamped. The superficial femoral and deep femoral arteries were controlled with  vessel loops. Longitudinal arteriotomy was made in the common femoral artery over the soft spot.  The left limb of the graft was cut the appropriate length, spatulated, and sewn end-to-side to the left common femoral artery using continuous 5-0 Prolene suture. Prior to completing this anastomosis the arteries were backbled and flushed appropriately and the anastomosis completed. Flow was established in the left leg which the patient tolerated from a hematologic standpoint.  At this point there was some backbleeding from the distal aorta which had been oversewn and an additional 2-0 Prolene suture was placed. The heparin was reversed with protamine. The wound was irrigated and hemostasis obtained. The aneurysm sac was closed over the graft with running 2-0 Vicryl. The retroperitoneal tissue was closed with running 2-0 Vicryl. The intestine was inspected and appeared to be well-perfused. The abdominal contents were returned to their normal position and the fascial layer closed with running #1 PDS sutures. The subcutaneous layer was closed with 3-0 Vicryl. The abdominal incision was closed with 4-0 Vicryl on the skin.  Hemostasis was obtained in the groins. Each of the groin wounds were closed with deep layer of 2-0 Vicryl subcutaneously and 3-0 Vicryl and skin closed with 4-0 Vicryl. Liquid and was applied. A sterile dressing was applied. Both feet appeared warm and well-perfused.  [name], MD, FACS [facility]  DATE OF DICTATION:   07/22/2016

## 2016-07-22 NOTE — Progress Notes (Signed)
   VASCULAR SURGERY ASSESSMENT & PLAN:  Hemodynamically stable  Good U/O  Stable post op  SUBJECTIVE: Pain well controlled  PHYSICAL EXAM: Vitals:   07/22/16 1400 07/22/16 1403 07/22/16 1437 07/22/16 1500  BP:  (!) 130/59  (!) 149/66  Pulse: 73 73  71  Resp: 15   12  Temp:   97.1 F (36.2 C)   TempSrc:      SpO2: 97% 95%  95%  Weight:      Height:       Lungs clear Feet warm and well perfused.   LABS: Lab Results  Component Value Date   WBC 13.6 (H) 07/22/2016   HGB 11.1 (L) 07/22/2016   HCT 33.5 (L) 07/22/2016   MCV 92.3 07/22/2016   PLT 214 07/22/2016   Lab Results  Component Value Date   CREATININE 0.66 07/22/2016   Lab Results  Component Value Date   INR 1.34 07/22/2016   Active Problems:   Abdominal aortic aneurysm without rupture [facility](HCC)  Cari Carawayhris Gale Klar Beeper: [phone] 07/22/2016

## 2016-07-23 ENCOUNTER — Encounter (HOSPITAL_COMMUNITY): Payer: Self-pay | Admitting: Vascular Surgery

## 2016-07-23 ENCOUNTER — Inpatient Hospital Stay (HOSPITAL_COMMUNITY): Payer: Medicare HMO

## 2016-07-23 LAB — POCT I-STAT 7, (LYTES, BLD GAS, ICA,H+H)
Acid-Base Excess: 2 mmol/L (ref 0.0–2.0)
Acid-Base Excess: 3 mmol/L — ABNORMAL HIGH (ref 0.0–2.0)
Acid-base deficit: 1 mmol/L (ref 0.0–2.0)
BICARBONATE: 23.9 meq/L (ref 20.0–24.0)
Bicarbonate: 23.9 mEq/L (ref 20.0–24.0)
Bicarbonate: 27.8 mEq/L — ABNORMAL HIGH (ref 20.0–24.0)
Bicarbonate: 26.9 mEq/L — ABNORMAL HIGH (ref 20.0–24.0)
CALCIUM ION: 1.14 mmol/L (ref 1.12–1.23)
CALCIUM ION: 1.19 mmol/L (ref 1.12–1.23)
Calcium, Ion: 1.2 mmol/L (ref 1.12–1.23)
Calcium, Ion: 1.12 mmol/L (ref 1.12–1.23)
HCT: 25 % — ABNORMAL LOW (ref 36.0–46.0)
HEMATOCRIT: 32 % — AB (ref 36.0–46.0)
HEMATOCRIT: 29 % — AB (ref 36.0–46.0)
HEMATOCRIT: 27 % — AB (ref 36.0–46.0)
HEMOGLOBIN: 10.9 g/dL — AB (ref 12.0–15.0)
Hemoglobin: 9.9 g/dL — ABNORMAL LOW (ref 12.0–15.0)
Hemoglobin: 8.5 g/dL — ABNORMAL LOW (ref 12.0–15.0)
Hemoglobin: 9.2 g/dL — ABNORMAL LOW (ref 12.0–15.0)
O2 SAT: 100 %
O2 Saturation: 84 %
O2 Saturation: 99 %
O2 Saturation: 100 %
PCO2 ART: 44.8 mmHg (ref 35.0–45.0)
PH ART: 7.395 (ref 7.350–7.450)
PO2 ART: 396 mmHg — AB (ref 80.0–100.0)
PO2 ART: 120 mmHg — AB (ref 80.0–100.0)
POTASSIUM: 3.1 mmol/L — AB (ref 3.5–5.1)
POTASSIUM: 3.3 mmol/L — AB (ref 3.5–5.1)
POTASSIUM: 3.2 mmol/L — AB (ref 3.5–5.1)
Patient temperature: 37
Patient temperature: 35.8
Potassium: 3.4 mmol/L — ABNORMAL LOW (ref 3.5–5.1)
SODIUM: 144 mmol/L (ref 135–145)
SODIUM: 139 mmol/L (ref 135–145)
Sodium: 138 mmol/L (ref 135–145)
Sodium: 138 mmol/L (ref 135–145)
TCO2: 25 mmol/L (ref 0–100)
TCO2: 29 mmol/L (ref 0–100)
TCO2: 25 mmol/L (ref 0–100)
TCO2: 28 mmol/L (ref 0–100)
pCO2 arterial: 39 mmHg (ref 35.0–45.0)
pCO2 arterial: 35.7 mmHg (ref 35.0–45.0)
pCO2 arterial: 38.7 mmHg (ref 35.0–45.0)
pH, Arterial: 7.395 (ref 7.350–7.450)
pH, Arterial: 7.432 (ref 7.350–7.450)
pH, Arterial: 7.448 (ref 7.350–7.450)
pO2, Arterial: 48 mmHg — ABNORMAL LOW (ref 80.0–100.0)
pO2, Arterial: 192 mmHg — ABNORMAL HIGH (ref 80.0–100.0)

## 2016-07-23 LAB — COMPREHENSIVE METABOLIC PANEL WITH GFR
ALT: 11 U/L — ABNORMAL LOW (ref 14–54)
AST: 24 U/L (ref 15–41)
Albumin: 2.9 g/dL — ABNORMAL LOW (ref 3.5–5.0)
Alkaline Phosphatase: 45 U/L (ref 38–126)
Anion gap: 7 (ref 5–15)
BUN: 13 mg/dL (ref 6–20)
CO2: 26 mmol/L (ref 22–32)
Calcium: 8.2 mg/dL — ABNORMAL LOW (ref 8.9–10.3)
Chloride: 104 mmol/L (ref 101–111)
Creatinine, Ser: 0.65 mg/dL (ref 0.44–1.00)
GFR calc Af Amer: >60 mL/min
GFR calc non Af Amer: >60 mL/min
Glucose, Bld: 174 mg/dL — ABNORMAL HIGH (ref 65–99)
Potassium: 3.9 mmol/L (ref 3.5–5.1)
Sodium: 137 mmol/L (ref 135–145)
Total Bilirubin: 0.5 mg/dL (ref 0.3–1.2)
Total Protein: 4.8 g/dL — ABNORMAL LOW (ref 6.5–8.1)

## 2016-07-23 LAB — CBC
HCT: 34.5 % — ABNORMAL LOW (ref 36.0–46.0)
Hemoglobin: 11.3 g/dL — ABNORMAL LOW (ref 12.0–15.0)
MCH: 29.9 pg (ref 26.0–34.0)
MCHC: 32.8 g/dL (ref 30.0–36.0)
MCV: 91.3 fL (ref 78.0–100.0)
Platelets: 247 K/uL (ref 150–400)
RBC: 3.78 MIL/uL — ABNORMAL LOW (ref 3.87–5.11)
RDW: 14.9 % (ref 11.5–15.5)
WBC: 13.4 K/uL — ABNORMAL HIGH (ref 4.0–10.5)

## 2016-07-23 LAB — POCT I-STAT 3, ART BLOOD GAS (G3+)
Acid-Base Excess: 1 mmol/L (ref 0.0–2.0)
BICARBONATE: 25.8 meq/L — AB (ref 20.0–24.0)
O2 Saturation: 90 %
PCO2 ART: 42.3 mmHg (ref 35.0–45.0)
PH ART: 7.393 (ref 7.350–7.450)
PO2 ART: 60 mmHg — AB (ref 80.0–100.0)
TCO2: 27 mmol/L (ref 0–100)

## 2016-07-23 LAB — MAGNESIUM: Magnesium: 1.3 mg/dL — ABNORMAL LOW (ref 1.7–2.4)

## 2016-07-23 LAB — AMYLASE: AMYLASE: 96 U/L (ref 28–100)

## 2016-07-23 NOTE — Progress Notes (Signed)
   VASCULAR SURGERY ASSESSMENT & PLAN:  1 Day Post-Op s/p: Open repair of juxtarenal AAA  CARDIAC: Hemodynamically stable  RENAL: Renal function is normal with good urine output  PULMONARY: IS and mobilize  GI/NUTRITION: NG tube with 200 cc of output. Will discontinue.  Transfer to 2 W. Telemetry.  SUBJECTIVE: Pain well controlled.  PHYSICAL EXAM: Vitals:   07/23/16 0500 07/23/16 0600 07/23/16 0700 07/23/16 0710  BP: 116/62 126/69 135/73   Pulse: (!) 108 (!) 107 (!) 106   Resp: 18 19 19    Temp:    98.6 F (37 C)  TempSrc:    Oral  SpO2: 97% 96% 96%   Weight:      Height:       Dressings are dry. Abdomen: No bowel sounds Lungs: Good air exchange Palpable dorsalis pedis pulses bilaterally  LABS: Lab Results  Component Value Date   WBC 13.4 (H) 07/23/2016   HGB 11.3 (L) 07/23/2016   HCT 34.5 (L) 07/23/2016   MCV 91.3 07/23/2016   PLT 247 07/23/2016   Lab Results  Component Value Date   CREATININE 0.65 07/23/2016   Lab Results  Component Value Date   INR 1.34 07/22/2016   Active Problems:   Abdominal aortic aneurysm without rupture [facility](HCC)  Cari Carawayhris Dickson Beeper: [phone] 07/23/2016

## 2016-07-23 NOTE — Progress Notes (Signed)
Patient Tx to 2W15 from 2S. Patient up in chair. Tele monitor placed. Patient oriented to unit and room. Patient belongings at bedside. Patient to stay NPO with no PO meds per 2S RN. Call bell within reach. Will continue to monitor.   Valinda HoarLexie Emma Schupp RN

## 2016-07-23 NOTE — Evaluation (Addendum)
Physical Therapy Evaluation Patient Details Name: Nicole GivensJanice D Dean MRN: [id number] DOB: 26-Dec-1940 Today's Date: 07/23/2016   History of Present Illness  75 yo admitted for AAA repair. PMhx: anxiety, arthritis, CAD, GERD, HTN, HLD  Clinical Impression  Pt very pleasant and apprehensive about movement. Pt with pain 7-8/10 in abdomen with mobility despite premedication and activity limited based on pt tolerance. Pt with decreased mobility, gait and function as well as pain who will benefit from acute therapy to maximize mobility and independence to decrease burden of care for safe D/C home with family.     Follow Up Recommendations Home health PT;Supervision for mobility/OOB    Equipment Recommendations  3in1 (PT)    Recommendations for Other Services OT consult     Precautions / Restrictions Precautions Precautions: Fall Precaution Comments: watch sats      Mobility  Bed Mobility Overal bed mobility: Needs Assistance Bed Mobility: Rolling;Sidelying to Sit Rolling: Min assist Sidelying to sit: Min assist       General bed mobility comments: cues for sequence with assist to rotate pelvis and elevate trunk with HOB 20degrees cues for splinting with pillow with mobility  Transfers Overall transfer level: Needs assistance   Transfers: Sit to/from Stand Sit to Stand: Min assist         General transfer comment: cues for hand placement, anterior translation and safety  Ambulation/Gait Ambulation/Gait assistance: Supervision Ambulation Distance (Feet): 10 Feet Assistive device: Rolling walker (2 wheeled) Gait Pattern/deviations: Step-to pattern;Decreased stride length;Trunk flexed;Shuffle   Gait velocity interpretation: Below normal speed for age/gender General Gait Details: cues for posture and position in RW. gait limited by pain and nausea  Stairs            Wheelchair Mobility    Modified Rankin (Stroke Patients Only)       Balance                                              Pertinent Vitals/Pain Pain Assessment: 0-10 Pain Score: 8  Pain Location: abdomen Pain Descriptors / Indicators: Sore;Tender Pain Intervention(s): Limited activity within patient's tolerance;Monitored during session;Premedicated before session;Repositioned   HR 105 sats down to 87 on RA, remained 94% on 2L BP 114/64    Home Living Family/patient expects to be discharged to:: Private residence Living Arrangements: Alone Available Help at Discharge: Family;Available 24 hours/day Type of Home: House Home Access: Stairs to enter Entrance Stairs-Rails: Doctor, general practiceight;Left Entrance Stairs-Number of Steps: 5 Home Layout: One level Home Equipment: Walker - 2 wheels;Shower seat      Prior Function Level of Independence: Independent         Comments: pt enjoys baking and gardening. children can stay with her at D/C     Hand Dominance        Extremity/Trunk Assessment   Upper Extremity Assessment: Generalized weakness           Lower Extremity Assessment: Generalized weakness      Cervical / Trunk Assessment: Normal  Communication   Communication: No difficulties  Cognition Arousal/Alertness: Awake/alert Behavior During Therapy: WFL for tasks assessed/performed Overall Cognitive Status: Within Functional Limits for tasks assessed                      General Comments      Exercises        Assessment/Plan  PT Assessment Patient needs continued PT services  PT Diagnosis Difficulty walking;Acute pain   PT Problem List Decreased mobility;Decreased strength;Decreased activity tolerance;Decreased knowledge of use of DME;Pain;Decreased skin integrity  PT Treatment Interventions Gait training;Stair training;Functional mobility training;Patient/family education;Therapeutic exercise;Therapeutic activities;DME instruction   PT Goals (Current goals can be found in the Care Plan section) Acute Rehab PT Goals Patient Stated  Goal: return home to baking PT Goal Formulation: With patient Time For Goal Achievement: 08/06/16 Potential to Achieve Goals: Good    Frequency Min 3X/week   Barriers to discharge        Co-evaluation               End of Session Equipment Utilized During Treatment: Gait belt;Oxygen Activity Tolerance: Patient limited by pain Patient left: in chair;with call bell/phone within reach;with nursing/sitter in room Nurse Communication: Mobility status         Time: [id number] PT Time Calculation (min) (ACUTE ONLY): 21 min   Charges:   PT Evaluation $PT Eval Moderate Complexity: 1 Procedure     PT G CodesDelorse Lek:        Tabor, Tienna Bienkowski Beth 07/23/2016, 8:47 AM Delaney MeigsMaija Tabor Giordana Weinheimer, PT [phone]

## 2016-07-23 NOTE — Anesthesia Postprocedure Evaluation (Signed)
Anesthesia Post Note  Patient: Nicole GivensJanice D Dean  Procedure(s) Performed: Procedure(s) (LRB): JUXTARENAL  ABDOMINAL AORTIC ANEURYSM REPAIR (N/A) AORTA BIFEMORAL BYPASS GRAFT (N/A) RIGHT COMMON ILIAC ANEURYSM REPAIR (Right)  Patient location during evaluation: PACU Anesthesia Type: General Level of consciousness: awake and alert and patient cooperative Pain management: pain level controlled Vital Signs Assessment: post-procedure vital signs reviewed and stable Respiratory status: spontaneous breathing and respiratory function stable Cardiovascular status: stable Anesthetic complications: no    Last Vitals:  Vitals:   07/23/16 0710 07/23/16 0843  BP:  114/64  Pulse:  (!) 105  Resp:    Temp: 37 C     Last Pain:  Vitals:   07/23/16 0802  TempSrc:   PainSc: 8                  Annaliza Zia S

## 2016-07-23 NOTE — Progress Notes (Signed)
Patient tx to 2W15, belongings at bedside, receiving RN at bedside, patient placed on tele, up to chair with call bell in reach. Called and updated daughter on transfer. Spoke with Dr. Edilia Boickson concerning meds to be given tonight. Dr. Edilia Boickson wants to keep patient strictly NPO-no PO meds. Receiving RN made aware. No further questions from patient or RN.  Hermina BartersBOWMAN, Isadore Palecek M, RN

## 2016-07-23 NOTE — Progress Notes (Signed)
Occupational Therapy Evaluation Patient Details Name: Nicole GivensJanice D Dean MRN: [id number] DOB: Mar 09, 1941 Today's Date: 07/23/2016    History of Present Illness 75 yo admitted for AAA repair. PMhx: anxiety, arthritis, CAD, GERD, HTN, HLD   Clinical Impression   PTA, pt lived alone and was independent with ADL and mobility. Pt currently requires mod A for LB ADL and min A for mobility. Limited this pm by pain. Will see pt acutely to educate on compensatory techniques for ADL and facilitate safe D/C home with initial 24/7 S for mobility/ADL.     Follow Up Recommendations  Home health OT;Supervision/Assistance - 24 hour (initially for mobility and ADL)    Equipment Recommendations  3 in 1 bedside comode    Recommendations for Other Services       Precautions / Restrictions Precautions Precautions: Fall      Mobility Bed Mobility               General bed mobility comments: OOB in chair  Transfers       Sit to Stand: Min assist              Balance Overall balance assessment: Needs assistance   Sitting balance-Leahy Scale: Good       Standing balance-Leahy Scale: Fair                              ADL Overall ADL's : Needs assistance/impaired Eating/Feeding: NPO   Grooming: Set up;Sitting   Upper Body Bathing: Minimal assitance;Sitting Upper Body Bathing Details (indicate cue type and reason): due to pain Lower Body Bathing: Moderate assistance;Sit to/from stand Lower Body Bathing Details (indicate cue type and reason): unable to cross legs over knees Upper Body Dressing : Minimal assistance;Sitting   Lower Body Dressing: Moderate assistance;Sit to/from stand               Functional mobility during ADLs: Minimal assistance;Rolling walker (limited) General ADL Comments: May benefit from AE. Very painful this pm.     Vision     Perception     Praxis      Pertinent Vitals/Pain Pain Assessment: 0-10 Pain Score: 6  Pain  Location: abdomen Pain Descriptors / Indicators: Aching;Sharp;Discomfort;Guarding Pain Intervention(s): Limited activity within patient's tolerance     Hand Dominance Right   Extremity/Trunk Assessment Upper Extremity Assessment Upper Extremity Assessment: Generalized weakness   Lower Extremity Assessment Lower Extremity Assessment: Generalized weakness   Cervical / Trunk Assessment Cervical / Trunk Assessment: Normal   Communication Communication Communication: No difficulties   Cognition Arousal/Alertness: Awake/alert Behavior During Therapy: WFL for tasks assessed/performed Overall Cognitive Status: Within Functional Limits for tasks assessed                     General Comments       Exercises       Shoulder Instructions      Home Living Family/patient expects to be discharged to:: Private residence Living Arrangements: Alone Available Help at Discharge: Family;Available 24 hours/day Type of Home: House Home Access: Stairs to enter Entergy CorporationEntrance Stairs-Number of Steps: 5 Entrance Stairs-Rails: Right;Left Home Layout: One level     Bathroom Shower/Tub: Tub/shower unit Shower/tub characteristics: Engineer, building servicesCurtain Bathroom Toilet: Standard Bathroom Accessibility: Yes How Accessible: Accessible via walker Home Equipment: Walker - 2 wheels;Shower seat;Grab bars - tub/shower          Prior Functioning/Environment Level of Independence: Independent  OT Diagnosis: Generalized weakness;Acute pain   OT Problem List: Decreased strength;Decreased range of motion;Decreased activity tolerance;Impaired balance (sitting and/or standing);Decreased safety awareness;Decreased knowledge of use of DME or AE;Decreased knowledge of precautions;Pain;Increased edema   OT Treatment/Interventions: Self-care/ADL training;Energy conservation;Therapeutic activities;Patient/family education    OT Goals(Current goals can be found in the care plan section) Acute Rehab OT  Goals Patient Stated Goal: return home to baking OT Goal Formulation: With patient Time For Goal Achievement: 08/06/16 Potential to Achieve Goals: Good ADL Goals Pt Will Perform Lower Body Bathing: with supervision;sit to/from stand;with adaptive equipment;with set-up Pt Will Perform Lower Body Dressing: with set-up;with supervision;sit to/from stand;with adaptive equipment Pt Will Transfer to Toilet: with supervision;ambulating;bedside commode (RW) Pt Will Perform Toileting - Clothing Manipulation and hygiene: with modified independence;sit to/from stand;sitting/lateral leans Additional ADL Goal #1: Pt will verbalize 3 energy conservation techniques for ADL  OT Frequency: Min 2X/week   Barriers to D/C:            Co-evaluation              End of Session Nurse Communication: Mobility status  Activity Tolerance: Patient limited by pain Patient left: in chair;with call bell/phone within reach   Time: [id number] OT Time Calculation (min): 13 min Charges:  OT General Charges $OT Visit: 1 Procedure OT Evaluation $OT Eval Moderate Complexity: 1 Procedure G-Codes:    Alma Mohiuddin,HILLARY 07/23/2016, 3:27 PM  [facility] Letha Mirabal, OTR/L  [phone] [facility] Ketra Duchesne, OTR/L  [phone] [facility] Kaileigh Viswanathan, OTR/L  [phone] 07/23/2016

## 2016-07-24 LAB — TYPE AND SCREEN
ABO/RH(D): A POS
ANTIBODY SCREEN: NEGATIVE
UNIT DIVISION: 0
UNIT DIVISION: 0
Unit division: 0
Unit division: 0

## 2016-07-24 LAB — GLUCOSE, CAPILLARY: Glucose-Capillary: 153 mg/dL — ABNORMAL HIGH (ref 65–99)

## 2016-07-24 NOTE — Progress Notes (Addendum)
  [facility] Progress Note  Subjective  - POD #2  Having some nausea and abdominal pain this am. No flatus.   Objective Vitals:   07/23/16 1958 07/24/16 0456  BP: (!) 150/66 (!) 133/55  Pulse: (!) 122 (!) 116  Resp: 18 18  Temp: 98.7 F (37.1 C) 98.6 F (37 C)    Intake/Output Summary (Last 24 hours) at 07/24/16 0838 Last data filed at 07/24/16 0700  Gross per 24 hour  Intake             1500 ml  Output              750 ml  Net              750 ml   RRR Lungs clear b/l Hypoactive bowel sounds. Abdomen soft, no distension, mild diffuse tenderness Midline abdominal incision and bilateral groin incisions clean and intact.  Palpable DP pulses bilaterally  Assessment/Planning: 75 y.o. female is s/p: open repair juxtarenal AAA 2 Days Post-Op   Nausea this am. No flatus. Continue NPO until bowel function returns.  Good UOP. Incentive spirometry 10x/hr.  Tachycardia likely secondary to pain.  Dry gauze to groins bilaterally to wick moisture.  Mobilize today.   Raymond GurneyKimberly A Trinh 07/24/2016 8:38 AM --  Laboratory CBC    Component Value Date/Time   WBC 13.4 (H) 07/23/2016 0623   HGB 11.3 (L) 07/23/2016 0623   HCT 34.5 (L) 07/23/2016 0623   PLT 247 07/23/2016 0623    BMET    Component Value Date/Time   NA 137 07/23/2016 0623   K 3.9 07/23/2016 0623   CL 104 07/23/2016 0623   CO2 26 07/23/2016 0623   GLUCOSE 174 (H) 07/23/2016 0623   BUN 13 07/23/2016 0623   CREATININE 0.65 07/23/2016 0623   CREATININE 0.62 06/04/2016 0958   CALCIUM 8.2 (L) 07/23/2016 0623   GFRNONAA >60 07/23/2016 0623   GFRAA >60 07/23/2016 0623    COAG Lab Results  Component Value Date   INR 1.34 07/22/2016   INR 1.03 07/16/2016   INR 1.52 (H) 04/04/2016   No results found for: PTT  Antibiotics Anti-infectives    Start     Dose/Rate Route Frequency Ordered Stop   07/22/16 1930  vancomycin (VANCOCIN) IVPB 1000 mg/200 mL premix  Status:  Discontinued     1,000  mg 200 mL/hr over 60 Minutes Intravenous Every 12 hours 07/22/16 1508 07/22/16 1535   07/22/16 1930  vancomycin (VANCOCIN) 500 mg in sodium chloride 0.9 % 100 mL IVPB     500 mg 100 mL/hr over 60 Minutes Intravenous Every 12 hours 07/22/16 1535 07/23/16 0806   07/22/16 0630  vancomycin (VANCOCIN) IVPB 1000 mg/200 mL premix     1,000 mg 200 mL/hr over 60 Minutes Intravenous To [facility] 07/21/16 2015 07/22/16 0820       Maris BergerKimberly Trinh, PA-C [facility] Office: [phone] Pager: [phone] 07/24/2016 8:38 AM  I have interviewed the patient and examined the patient. I agree with the findings by the PA. Has some bowel sounds, but no flatus yet. Can probably start sips of fulls in AM Mobilize IS Overall, doing very well.   Cari Carawayhris Shashwat Cleary, MD [phone]

## 2016-07-24 NOTE — Care Management Note (Signed)
Case Management Note Donn PieriniKristi Shavar Gorka RN, BSN Unit 2W-Case Manager [phone]  Patient Details  Name: Talitha GivensJanice D Keleher MRN: [id number] Date of Birth: 1941-07-25  Subjective/Objective:     Pt s/p AAA repair on 8/22- tx from ICU to 2W on 07/23/16 - NGT out- pt remains NPO-on 8/24              Action/Plan: PTA pt lived at home alone- has daughter and son nearby- [facility] CM to follow for d/c needs- will need HHPT order for discharge.   Expected Discharge Date:                  Expected Discharge Plan:  Home w Home Health Services  In-House Referral:     Discharge planning Services  CM Consult  Post Acute Care Choice:    Choice offered to:     DME Arranged:    DME Agency:     HH Arranged:    HH Agency:     Status of Service:  In process, will continue to follow  If discussed at Long Length of Stay Meetings, dates discussed:    Additional Comments:  Darrold SpanWebster, Jahree Dermody Hall, RN 07/24/2016, 10:27 AM

## 2016-07-24 NOTE — Progress Notes (Signed)
Occupational Therapy Treatment Patient Details Name: Nicole GivensJanice D Frediani MRN: [id number] DOB: Jul 22, 1941 Today's Date: 07/24/2016    History of present illness 75 yo admitted for AAA repair. PMhx: anxiety, arthritis, CAD, GERD, HTN, HLD   OT comments  Pt continues to be limited by increased abdominal pain. Attempted to practice with AE for LB ADLs, but pt unable to tolerate more than 10 minutes and required max assist to complete LB dressing tasks. Due to increasing pain level, felt it was necessary to end session and allow RN to administer pain medication. Will continue to follow pt acutely to address OT needs and goals.    Follow Up Recommendations  Home health OT;Supervision/Assistance - 24 hour    Equipment Recommendations  3 in 1 bedside comode    Recommendations for Other Services      Precautions / Restrictions Precautions Precautions: Fall Restrictions Weight Bearing Restrictions: No       Mobility Bed Mobility               General bed mobility comments: Pt up in chair on OT arrival  Transfers                 General transfer comment: Not attempted at this time due increased pain    Balance Overall balance assessment: Needs assistance Sitting-balance support: No upper extremity supported;Feet supported Sitting balance-Leahy Scale: Fair Sitting balance - Comments: limited due to pain                           ADL Overall ADL's : Needs assistance/impaired Eating/Feeding: Set up;Sitting   Grooming: Set up;Sitting           Upper Body Dressing : Minimal assistance;Sitting   Lower Body Dressing: Maximal assistance;Sit to/from stand;With adaptive equipment;Cueing for compensatory techniques Lower Body Dressing Details (indicate cue type and reason): attempted to practice with AE, but pt unable to tolerate much due to pain             Functional mobility during ADLs: Moderate assistance;Rolling walker General ADL Comments: Continues  to be experiencing increased pain      Vision                     Perception     Praxis      Cognition   Behavior During Therapy: WFL for tasks assessed/performed Overall Cognitive Status: Within Functional Limits for tasks assessed                       Extremity/Trunk Assessment               Exercises     Shoulder Instructions       General Comments      Pertinent Vitals/ Pain       Pain Assessment: Faces Pain Score: 10-Worst pain ever Pain Location: abdomen Pain Descriptors / Indicators: Aching;Sharp;Tender Pain Intervention(s): Limited activity within patient's tolerance;Monitored during session;Repositioned;Patient requesting pain meds-RN notified  Home Living                                          Prior Functioning/Environment              Frequency Min 2X/week     Progress Toward Goals  OT Goals(current goals can now be found in the care plan  section)  Progress towards OT goals: Progressing toward goals  Acute Rehab OT Goals Patient Stated Goal: return home to baking OT Goal Formulation: With patient Time For Goal Achievement: 08/06/16 Potential to Achieve Goals: Good ADL Goals Pt Will Perform Lower Body Bathing: with supervision;sit to/from stand;with adaptive equipment;with set-up Pt Will Perform Lower Body Dressing: with set-up;with supervision;sit to/from stand;with adaptive equipment Pt Will Transfer to Toilet: with supervision;ambulating;bedside commode Pt Will Perform Toileting - Clothing Manipulation and hygiene: with modified independence;sit to/from stand;sitting/lateral leans Additional ADL Goal #1: Pt will verbalize 3 energy conservation techniques for ADL  Plan Discharge plan remains appropriate    Co-evaluation                 End of Session     Activity Tolerance Patient limited by pain   Patient Left in chair;with call bell/phone within reach   Nurse Communication  Mobility status        Time: 1128-1140 OT Time Calculation (min): 12 min  Charges: OT General Charges $OT Visit: 1 Procedure OT Treatments $Self Care/Home Management : 8-22 mins  Nils PyleJulia Sharrod Achille, OTR/L Pager: [phone] 07/24/2016, 2:40 PM

## 2016-07-25 MED ORDER — BISACODYL 10 MG RE SUPP
10.0000 mg | Freq: Every day | RECTAL | Status: DC | PRN
  Filled 2016-07-25 (×2): qty 1

## 2016-07-25 MED ORDER — OXYCODONE-ACETAMINOPHEN 5-325 MG PO TABS
1.0000 | ORAL_TABLET | ORAL | Status: DC | PRN
  Administered 2016-07-25 – 2016-07-27 (×9): 2 via ORAL
  Administered 2016-07-29: 1 via ORAL
  Administered 2016-07-29 – 2016-07-30 (×4): 2 via ORAL
  Administered 2016-07-30: 1 via ORAL
  Administered 2016-07-30 – 2016-08-01 (×9): 2 via ORAL
  Filled 2016-07-25 (×13): qty 2
  Filled 2016-07-25: qty 1
  Filled 2016-07-25 (×10): qty 2

## 2016-07-25 MED ORDER — BISACODYL 10 MG RE SUPP
10.0000 mg | Freq: Once | RECTAL | Status: AC
  Administered 2016-07-25: 10 mg via RECTAL
  Filled 2016-07-25: qty 1

## 2016-07-25 NOTE — Care Management Important Message (Signed)
Important Message  Patient Details  Name: Nicole GivensJanice D Dean MRN: [id number] Date of Birth: 07/04/1941   Medicare Important Message Given:  Yes    Bernadette HoitShoffner, Nalanie Winiecki Coleman 07/25/2016, 8:57 AM

## 2016-07-25 NOTE — Care Management Note (Signed)
Case Management Note Donn PieriniKristi Michell Kader RN, BSN Unit 2W-Case Manager [phone]  Patient Details  Name: Nicole GivensJanice D Dean MRN: [id number] Date of Birth: 1941-08-05  Subjective/Objective:     Pt s/p AAA repair on 8/22- tx from ICU to 2W on 07/23/16 - NGT out- pt remains NPO-on 8/24              Action/Plan: PTA pt lived at home alone- has daughter and son nearby- [facility] to follow for d/c needs- will need HHPT order for discharge.   Expected Discharge Date:                  Expected Discharge Plan:  Home w Home Health Services  In-House Referral:     Discharge planning Services  CM Consult  Post Acute Care Choice:    Choice offered to:     DME Arranged:    DME Agency:     HH Arranged:    HH Agency:     Status of Service:  In process, will continue to follow  If discussed at Long Length of Stay Meetings, dates discussed:    Additional Comments:  07/25/16- 1500- Latysha Thackston RN, CM- spoke with pt and daughter Nicole Dean at bedside- per conversation pt would like to return home however per daughter will not have 24/7 supervision- and [facility] need [facility]- [facility] the past [facility]- if needs rehab would prefer [facility] first choice if insurance would approve- will have [facility] screen pt to see if pt may be appropriate before requesting official consult. Pt also states she would like a 3n1 for home and may need home 02- CM to follow up as pt progresses.   Darrold SpanWebster, Lauriana Denes Hall, RN 07/25/2016, 3:15 PM

## 2016-07-25 NOTE — Progress Notes (Signed)
   VASCULAR SURGERY ASSESSMENT & PLAN:  3 Days Post-Op s/p: Open repair of juxtarenal AAA  GI/Nutrition: Good bowel sounds. Dulcolax. Start sips fulls.   Ambulate  IS  Lovenox for DVT prophylaxis  SUBJECTIVE: No BM. No flatus. Hungry. No nausea.  PHYSICAL EXAM: Vitals:   07/24/16 0826 07/24/16 1429 07/24/16 2017 07/25/16 0335  BP:  (!) 126/54 (!) 142/56 (!) 133/50  Pulse:  (!) 108 (!) 114 (!) 105  Resp:  18 18   Temp:  98.1 F (36.7 C) 99.1 F (37.3 C) 98.9 F (37.2 C)  TempSrc:  Oral Oral Oral  SpO2: 94% 96% 96% 94%  Weight:      Height:       ABD: good bowel sounds Incisions fine Palpable DP pulses No significant LE swelling  LABS: Lab Results  Component Value Date   WBC 13.4 (H) 07/23/2016   HGB 11.3 (L) 07/23/2016   HCT 34.5 (L) 07/23/2016   MCV 91.3 07/23/2016   PLT 247 07/23/2016   Lab Results  Component Value Date   CREATININE 0.65 07/23/2016   Lab Results  Component Value Date   INR 1.34 07/22/2016   CBG (last 3)   Recent Labs  07/24/16 0819  GLUCAP 153*    Active Problems:   Abdominal aortic aneurysm without rupture [facility](HCC)  Cari Carawayhris Dickson Beeper: [phone] 07/25/2016

## 2016-07-25 NOTE — Progress Notes (Signed)
Physical Therapy Treatment Patient Details Name: Nicole GivensJanice D Dean MRN: [id number] DOB: 02-22-1941 Today's Date: 07/25/2016    History of Present Illness 75 yo admitted for AAA repair. PMhx: anxiety, arthritis, CAD, GERD, HTN, HLD    PT Comments    Progress steady but slow. Needs to be more independent before returning home. Recommend CIR consideration to help pt reach that level.  Follow Up Recommendations  CIR     Equipment Recommendations  3in1 (PT)    Recommendations for Other Services       Precautions / Restrictions Precautions Precautions: Fall Precaution Comments: watch sats    Mobility  Bed Mobility Overal bed mobility: Needs Assistance Bed Mobility: Rolling;Sidelying to Sit Rolling: Mod assist Sidelying to sit: Mod assist       General bed mobility comments: Assist to bring legs off and elevate trunk into sitting  Transfers Overall transfer level: Needs assistance Equipment used: Rolling walker (2 wheeled) Transfers: Sit to/from Stand Sit to Stand: Mod assist         General transfer comment: Assist to bring hips up. Cues to shift weight anteriorly  Ambulation/Gait Ambulation/Gait assistance: Min assist Ambulation Distance (Feet): 70 Feet Assistive device: Rolling walker (2 wheeled) Gait Pattern/deviations: Decreased step length - right;Decreased step length - left;Shuffle;Step-through pattern Gait velocity: decr Gait velocity interpretation: Below normal speed for age/gender General Gait Details: Cues to stand erect and take bigger steps   Stairs            Wheelchair Mobility    Modified Rankin (Stroke Patients Only)       Balance Overall balance assessment: Needs assistance Sitting-balance support: Bilateral upper extremity supported;Feet supported Sitting balance-Leahy Scale: Poor Sitting balance - Comments: UE support Postural control: Posterior lean Standing balance support: Bilateral upper extremity supported Standing  balance-Leahy Scale: Poor Standing balance comment: walker and min guard for static                    Cognition Arousal/Alertness: Awake/alert Behavior During Therapy: WFL for tasks assessed/performed Overall Cognitive Status: Within Functional Limits for tasks assessed                      Exercises      General Comments        Pertinent Vitals/Pain Pain Assessment: 0-10 Pain Score: 8  Pain Location: abdomen Pain Descriptors / Indicators: Aching Pain Intervention(s): Limited activity within patient's tolerance;Monitored during session;Patient requesting pain meds-RN notified    Home Living                      Prior Function            PT Goals (current goals can now be found in the care plan section) Progress towards PT goals: Progressing toward goals    Frequency  Min 3X/week    PT Plan Discharge plan needs to be updated    Co-evaluation             End of Session Equipment Utilized During Treatment: Gait belt;Oxygen Activity Tolerance: Patient tolerated treatment well;Patient limited by fatigue Patient left: in bed;with call bell/phone within reach;with family/visitor present;with nursing/sitter in room     Time: [id number] PT Time Calculation (min) (ACUTE ONLY): 37 min  Charges:  $Gait Training: 23-37 mins                    G Codes:      Nicole Dean 07/25/2016, 3:35 PM [employer]  Dean

## 2016-07-25 NOTE — Progress Notes (Signed)
Rehab Admissions Coordinator Note:  Patient was screened by Trish MageLogue, Terea Neubauer M for appropriateness for an Inpatient Acute Rehab Consult.  At this time, we are recommending Inpatient Rehab consult.  Trish MageLogue, Octaviano Mukai M 07/25/2016, 4:16 PM  I can be reached at [phone].

## 2016-07-26 MED ORDER — SENNOSIDES-DOCUSATE SODIUM 8.6-50 MG PO TABS
1.0000 | ORAL_TABLET | Freq: Once | ORAL | Status: AC
  Administered 2016-07-26: 1 via ORAL
  Filled 2016-07-26: qty 1

## 2016-07-26 MED ORDER — ATORVASTATIN CALCIUM 40 MG PO TABS
40.0000 mg | ORAL_TABLET | Freq: Every day | ORAL | Status: DC
  Administered 2016-07-26 – 2016-07-31 (×6): 40 mg via ORAL
  Filled 2016-07-26 (×6): qty 1

## 2016-07-26 MED ORDER — CARVEDILOL 6.25 MG PO TABS
6.2500 mg | ORAL_TABLET | Freq: Two times a day (BID) | ORAL | Status: DC
  Administered 2016-07-26 – 2016-08-01 (×12): 6.25 mg via ORAL
  Filled 2016-07-26 (×13): qty 1

## 2016-07-26 MED ORDER — HYDROCHLOROTHIAZIDE 25 MG PO TABS
25.0000 mg | ORAL_TABLET | Freq: Every day | ORAL | Status: DC
  Administered 2016-07-26 – 2016-08-01 (×6): 25 mg via ORAL
  Filled 2016-07-26 (×7): qty 1

## 2016-07-26 MED ORDER — KETOROLAC TROMETHAMINE 15 MG/ML IJ SOLN
15.0000 mg | Freq: Four times a day (QID) | INTRAMUSCULAR | Status: AC
  Administered 2016-07-26 – 2016-07-29 (×12): 15 mg via INTRAVENOUS
  Filled 2016-07-26 (×13): qty 1

## 2016-07-26 NOTE — Progress Notes (Addendum)
  [facility] Progress Note  Subjective  - POD #4  Says abdomen hurts, but actually it is the groin incisions that hurt. No nausea. Passing flatus. No BM.   Objective Vitals:   07/25/16 1341 07/26/16 0640  BP: (!) 140/52 (!) 125/58  Pulse: (!) 103 (!) 110  Resp: 18 18  Temp: 98.3 F (36.8 C) 98.2 F (36.8 C)    Intake/Output Summary (Last 24 hours) at 07/26/16 [id number] Last data filed at 07/26/16 0448  Gross per 24 hour  Intake           1792.5 ml  Output                0 ml  Net           1792.5 ml   Midline incision c/d/i, bilateral groin incisions clean Abdomen with active bowel sounds, soft with mild diffuse tenderness to palpation.  Palpable DP pulses bilaterally   Assessment/Planning: 75 y.o. female is s/p: open repair of juxtarenal AAA 4 Days Post-Op   Advance diet today.  Dry gauze to groins daily.  Wean 02 as tolerated. Consult to CIR. Patient has no family support at home.   Raymond GurneyKimberly A Trinh 07/26/2016 8:32 AM --  Laboratory CBC    Component Value Date/Time   WBC 13.4 (H) 07/23/2016 0623   HGB 11.3 (L) 07/23/2016 0623   HCT 34.5 (L) 07/23/2016 0623   PLT 247 07/23/2016 0623    BMET    Component Value Date/Time   NA 137 07/23/2016 0623   K 3.9 07/23/2016 0623   CL 104 07/23/2016 0623   CO2 26 07/23/2016 0623   GLUCOSE 174 (H) 07/23/2016 0623   BUN 13 07/23/2016 0623   CREATININE 0.65 07/23/2016 0623   CREATININE 0.62 06/04/2016 0958   CALCIUM 8.2 (L) 07/23/2016 0623   GFRNONAA >60 07/23/2016 0623   GFRAA >60 07/23/2016 0623    COAG Lab Results  Component Value Date   INR 1.34 07/22/2016   INR 1.03 07/16/2016   INR 1.52 (H) 04/04/2016   No results found for: PTT  Antibiotics Anti-infectives    Start     Dose/Rate Route Frequency Ordered Stop   07/22/16 1930  vancomycin (VANCOCIN) IVPB 1000 mg/200 mL premix  Status:  Discontinued     1,000 mg 200 mL/hr over 60 Minutes Intravenous Every 12 hours 07/22/16 1508  07/22/16 1535   07/22/16 1930  vancomycin (VANCOCIN) 500 mg in sodium chloride 0.9 % 100 mL IVPB     500 mg 100 mL/hr over 60 Minutes Intravenous Every 12 hours 07/22/16 1535 07/23/16 0806   07/22/16 0630  vancomycin (VANCOCIN) IVPB 1000 mg/200 mL premix     1,000 mg 200 mL/hr over 60 Minutes Intravenous To ShortStay Surgical 07/21/16 2015 07/22/16 0820       Maris BergerKimberly Trinh, PA-C [facility] Office: [phone] Pager: [phone] 07/26/2016 8:32 AM    I agree with the above.  Tolerating clears, will advance to regular Palpable pedal pulses Incisions ok Restart home meds, including statin Mobilize, may need CIR    Wells Brabham

## 2016-07-26 NOTE — Progress Notes (Signed)
Patient refused to ambulate due to pain and "being too tired". She stated she would ambulate tomorrow.

## 2016-07-27 LAB — BASIC METABOLIC PANEL
ANION GAP: 5 (ref 5–15)
BUN: 8 mg/dL (ref 6–20)
CALCIUM: 8.2 mg/dL — AB (ref 8.9–10.3)
CO2: 26 mmol/L (ref 22–32)
CREATININE: 0.52 mg/dL (ref 0.44–1.00)
Chloride: 101 mmol/L (ref 101–111)
GFR calc Af Amer: >60 mL/min (ref >60)
GLUCOSE: 120 mg/dL — AB (ref 65–99)
Potassium: 3.4 mmol/L — ABNORMAL LOW (ref 3.5–5.1)
Sodium: 132 mmol/L — ABNORMAL LOW (ref 135–145)

## 2016-07-27 LAB — CBC
HCT: 22.7 % — ABNORMAL LOW (ref 36.0–46.0)
Hemoglobin: 7.4 g/dL — ABNORMAL LOW (ref 12.0–15.0)
MCH: 30.3 pg (ref 26.0–34.0)
MCHC: 32.6 g/dL (ref 30.0–36.0)
MCV: 93 fL (ref 78.0–100.0)
Platelets: 290 10*3/uL (ref 150–400)
RBC: 2.44 MIL/uL — ABNORMAL LOW (ref 3.87–5.11)
RDW: 14.8 % (ref 11.5–15.5)
WBC: 12.5 10*3/uL — ABNORMAL HIGH (ref 4.0–10.5)

## 2016-07-27 MED ORDER — SENNOSIDES-DOCUSATE SODIUM 8.6-50 MG PO TABS
2.0000 | ORAL_TABLET | Freq: Two times a day (BID) | ORAL | Status: DC
  Administered 2016-07-27 – 2016-07-31 (×8): 2 via ORAL
  Filled 2016-07-27 (×9): qty 2

## 2016-07-27 NOTE — Progress Notes (Signed)
Patient was given a cup of prune juice at about 10am and then given a second cup of prune juice about 6 pm today.  Patient is complaining of a tight sensation in the upper midepigastric region when she tries to swallow.  Patient ate about 25% of her meals today.  Patient complained of indigestion all day, requested and received ordered medication for this at suppertime.

## 2016-07-27 NOTE — Progress Notes (Addendum)
[facility] Progress Note  Subjective  - POD #5  Pain a little better. Had small BM yesterday. Not much of an appetite.   Objective Vitals:   07/26/16 2015 07/27/16 0550  BP: (!) 108/43 (!) 124/44  Pulse: 80 92  Resp: 14 20  Temp: 98.5 F (36.9 C) 98.7 F (37.1 C)    Intake/Output Summary (Last 24 hours) at 07/27/16 [id number] Last data filed at 07/26/16 0900  Gross per 24 hour  Intake              240 ml  Output                0 ml  Net              240 ml   Abdomen soft, non distended with mild tenderness to RLQ.  Abdominal incision c/d/i.  Groin incisions c/d/i.  Palpable DP pulses bilaterally.   Assessment/Planning: 75 y.o. female is s/p: open repair of juxtarenal AAA 5 Days Post-Op   Toradol added for pain yesterday. Pain somewhat improved. Creatinine stable.  Had small BM yesterday. Will give prune juice per patient's request.  ABLA: hgb 7.4 today, down from 11.3 on 8/23. Will repeat CBC tomorrow. Continue ambulation.  D/c IVF.   Raymond GurneyKimberly A Trinh 07/27/2016 8:28 AM --  Laboratory CBC    Component Value Date/Time   WBC 12.5 (H) 07/27/2016 0327   HGB 7.4 (L) 07/27/2016 0327   HCT 22.7 (L) 07/27/2016 0327   PLT 290 07/27/2016 0327    BMET    Component Value Date/Time   NA 132 (L) 07/27/2016 0327   K 3.4 (L) 07/27/2016 0327   CL 101 07/27/2016 0327   CO2 26 07/27/2016 0327   GLUCOSE 120 (H) 07/27/2016 0327   BUN 8 07/27/2016 0327   CREATININE 0.52 07/27/2016 0327   CREATININE 0.62 06/04/2016 0958   CALCIUM 8.2 (L) 07/27/2016 0327   GFRNONAA >60 07/27/2016 0327   GFRAA >60 07/27/2016 0327    COAG Lab Results  Component Value Date   INR 1.34 07/22/2016   INR 1.03 07/16/2016   INR 1.52 (H) 04/04/2016   No results found for: PTT  Antibiotics Anti-infectives    Start     Dose/Rate Route Frequency Ordered Stop   07/22/16 1930  vancomycin (VANCOCIN) IVPB 1000 mg/200 mL premix  Status:  Discontinued     1,000 mg 200 mL/hr over  60 Minutes Intravenous Every 12 hours 07/22/16 1508 07/22/16 1535   07/22/16 1930  vancomycin (VANCOCIN) 500 mg in sodium chloride 0.9 % 100 mL IVPB     500 mg 100 mL/hr over 60 Minutes Intravenous Every 12 hours 07/22/16 1535 07/23/16 0806   07/22/16 0630  vancomycin (VANCOCIN) IVPB 1000 mg/200 mL premix     1,000 mg 200 mL/hr over 60 Minutes Intravenous To ShortStay Surgical 07/21/16 2015 07/22/16 0820       Maris BergerKimberly Trinh, PA-C [facility] Office: [phone] Pager: [phone] 07/27/2016 8:28 AM    I agree with the above.  She has palpable pedal pulses.  Incisions look good.  She is tender on her right abdomen  Hb decreased to 7.4 from 11.3 4 days ago.  No absolute need for transfusion.  Will repeat in am.  Toradol helped with pain, will check Cr tomorrow.  Had small Stool yesterday and passing flatus.  Appetite is poor.  She is requesting prune juice.  D/c IVF and encourage drinking water   Wells Marq Rebello

## 2016-07-28 ENCOUNTER — Inpatient Hospital Stay (HOSPITAL_COMMUNITY): Payer: Medicare HMO

## 2016-07-28 DIAGNOSIS — I9589 Other hypotension: Secondary | ICD-10-CM

## 2016-07-28 DIAGNOSIS — I959 Hypotension, unspecified: Secondary | ICD-10-CM

## 2016-07-28 DIAGNOSIS — I714 Abdominal aortic aneurysm, without rupture: Principal | ICD-10-CM

## 2016-07-28 DIAGNOSIS — F329 Major depressive disorder, single episode, unspecified: Secondary | ICD-10-CM

## 2016-07-28 DIAGNOSIS — F411 Generalized anxiety disorder: Secondary | ICD-10-CM

## 2016-07-28 DIAGNOSIS — D62 Acute posthemorrhagic anemia: Secondary | ICD-10-CM

## 2016-07-28 DIAGNOSIS — R0682 Tachypnea, not elsewhere classified: Secondary | ICD-10-CM

## 2016-07-28 DIAGNOSIS — Z9889 Other specified postprocedural states: Secondary | ICD-10-CM

## 2016-07-28 DIAGNOSIS — D72829 Elevated white blood cell count, unspecified: Secondary | ICD-10-CM

## 2016-07-28 DIAGNOSIS — F32A Depression, unspecified: Secondary | ICD-10-CM

## 2016-07-28 DIAGNOSIS — I25810 Atherosclerosis of coronary artery bypass graft(s) without angina pectoris: Secondary | ICD-10-CM

## 2016-07-28 DIAGNOSIS — E871 Hypo-osmolality and hyponatremia: Secondary | ICD-10-CM

## 2016-07-28 DIAGNOSIS — R0602 Shortness of breath: Secondary | ICD-10-CM

## 2016-07-28 DIAGNOSIS — Z8679 Personal history of other diseases of the circulatory system: Secondary | ICD-10-CM

## 2016-07-28 DIAGNOSIS — I70213 Atherosclerosis of native arteries of extremities with intermittent claudication, bilateral legs: Secondary | ICD-10-CM

## 2016-07-28 LAB — BASIC METABOLIC PANEL
ANION GAP: 10 (ref 5–15)
BUN: 12 mg/dL (ref 6–20)
CALCIUM: 8.5 mg/dL — AB (ref 8.9–10.3)
CO2: 25 mmol/L (ref 22–32)
Chloride: 98 mmol/L — ABNORMAL LOW (ref 101–111)
Creatinine, Ser: 0.54 mg/dL (ref 0.44–1.00)
GFR calc Af Amer: >60 mL/min (ref >60)
GLUCOSE: 89 mg/dL (ref 65–99)
Potassium: 4.3 mmol/L (ref 3.5–5.1)
SODIUM: 133 mmol/L — AB (ref 135–145)

## 2016-07-28 LAB — CBC
HCT: 21 % — ABNORMAL LOW (ref 36.0–46.0)
HEMATOCRIT: 30.4 % — AB (ref 36.0–46.0)
Hemoglobin: 7 g/dL — ABNORMAL LOW (ref 12.0–15.0)
Hemoglobin: 10.2 g/dL — ABNORMAL LOW (ref 12.0–15.0)
MCH: 29.9 pg (ref 26.0–34.0)
MCH: 29.2 pg (ref 26.0–34.0)
MCHC: 33.3 g/dL (ref 30.0–36.0)
MCHC: 33.6 g/dL (ref 30.0–36.0)
MCV: 89.7 fL (ref 78.0–100.0)
MCV: 87.1 fL (ref 78.0–100.0)
PLATELETS: 323 10*3/uL (ref 150–400)
Platelets: 368 10*3/uL (ref 150–400)
RBC: 2.34 MIL/uL — ABNORMAL LOW (ref 3.87–5.11)
RBC: 3.49 MIL/uL — ABNORMAL LOW (ref 3.87–5.11)
RDW: 14.7 % (ref 11.5–15.5)
RDW: 16.9 % — AB (ref 11.5–15.5)
WBC: 18.7 10*3/uL — AB (ref 4.0–10.5)
WBC: 21.7 10*3/uL — ABNORMAL HIGH (ref 4.0–10.5)

## 2016-07-28 LAB — URINALYSIS, ROUTINE W REFLEX MICROSCOPIC
GLUCOSE, UA: NEGATIVE mg/dL
HGB URINE DIPSTICK: NEGATIVE
Ketones, ur: 15 mg/dL — AB
NITRITE: POSITIVE — AB
PH: 6 (ref 5.0–8.0)
PROTEIN: NEGATIVE mg/dL
Specific Gravity, Urine: 1.024 (ref 1.005–1.030)

## 2016-07-28 LAB — PREPARE RBC (CROSSMATCH)

## 2016-07-28 LAB — URINE MICROSCOPIC-ADD ON

## 2016-07-28 MED ORDER — DOCUSATE SODIUM 100 MG PO CAPS: 100.0000 mg | ORAL_CAPSULE | Freq: Two times a day (BID) | ORAL | Status: DC | PRN

## 2016-07-28 MED ORDER — ONDANSETRON 4 MG PO TBDP
4.0000 mg | ORAL_TABLET | Freq: Four times a day (QID) | ORAL | Status: DC | PRN
  Administered 2016-07-29: 4 mg via ORAL
  Filled 2016-07-28: qty 1

## 2016-07-28 MED ORDER — SODIUM CHLORIDE 0.9 % IV SOLN
Freq: Once | INTRAVENOUS | Status: AC
  Administered 2016-07-28: 12:00:00 via INTRAVENOUS

## 2016-07-28 NOTE — Clinical Social Work Note (Signed)
Clinical Social Work Assessment  Patient Details  Name: Nicole GivensJanice D Dean MRN: [id number] Date of Birth: Jun 26, 1941  Date of referral:  07/28/16               Reason for consult:  Facility Placement                Permission sought to share information with:  Facility Medical sales representativeContact Representative, Family Supports Permission granted to share information::  Yes, Verbal Permission Granted  Name::     ProofreaderCrystal Morrison  Agency::  SNFs  Relationship::  dtr  Contact Information:     Housing/Transportation Living arrangements for the past 2 months:  Single Family Home Source of Information:  Patient, Adult Children Patient Interpreter Needed:  None Criminal Activity/Legal Involvement Pertinent to Current Situation/Hospitalization:  No - Comment as needed Significant Relationships:  Adult Children Lives with:  Self Do you feel safe going back to the place where you live?  No Need for family participation in patient care:  No (Coment)  Care giving concerns:  Pt lives alone and does not have enough support to return home at this time.   Social Worker assessment / plan: CSW consulted for SNF back up for CIR.  Patient and pt dtr informed of CIR process and need for SNF back up if unsafe for pt to return home.  CSW confirmed they are agreeable to this alternate plan if CIR not an option at time of DC.   Employment status:  Retired Database administratornsurance information:  Managed Medicare PT Recommendations:  Inpatient Rehab Consult Information / Referral to community resources:  Skilled Nursing Facility  Patient/Family's Response to care:  Pt and pt dtr agreeable to short term SNF if needed but prefer CIR- pt very anxious at idea of SNF and initially fearful that this would become long term placement.  Patient/Family's Understanding of and Emotional Response to Diagnosis, Current Treatment, and Prognosis:  No questions or concerns but pt and pt dtr very hopeful pt will be able to go to CIR to allow her to return home  more quickly.  Emotional Assessment Appearance:  Appears stated age Attitude/Demeanor/Rapport:    Affect (typically observed):  Appropriate, Apprehensive Orientation:  Oriented to Self, Oriented to Place, Oriented to  Time, Oriented to Situation Alcohol / Substance use:  Not Applicable Psych involvement (Current and /or in the community):  No (Comment)  Discharge Needs  Concerns to be addressed:  Care Coordination Readmission within the last 30 days:  No Current discharge risk:  Physical Impairment Barriers to Discharge:  Continued Medical Work up   Burna SisUris, Aariah Godette H, LCSW 07/28/2016, 3:38 PM

## 2016-07-28 NOTE — NC FL2 (Signed)
[place] MEDICAID FL2 LEVEL OF CARE SCREENING TOOL     IDENTIFICATION  Patient Name: Nicole GivensJanice D Dean Birthdate: 05-29-1941 Sex: female Admission Date (Current Location): 07/22/2016  [facility] [place]   Facility and Address:  [facility], [address], [place], [place] [address]      Provider Number: [id number]  Attending Physician Name and Address:  Chuck Hinthristopher S Dickson, MD  Relative Name and Phone Number:       Current Level of Care: Hospital Recommended Level of Care: Skilled Nursing Facility Prior Approval Number:    Date Approved/Denied:   PASRR Number: [id number]  Discharge Plan: SNF    Current Diagnoses: Patient Active Problem List   Diagnosis Date Noted  . Abdominal aortic aneurysm without rupture (HCC) 07/22/2016  . CAD (coronary artery disease) 06/04/2016  . 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 Abnormal nuclear stress test 03/31/2016  . AAA (abdominal aortic aneurysm) (HCC) 03/25/2016  . Atherosclerotic peripheral vascular disease (HCC) 03/03/2016  . Depression with anxiety 08/09/2012  . DEGENERATIVE JOINT DISEASE 01/30/2010  . OSTEOPENIA 12/08/2007  . INSOMNIA, CHRONIC 08/30/2007  . DISORDER, DEPRESSIVE NEC 08/30/2007  . GERD 08/30/2007  . DIVERTICULAR DISEASE 08/30/2007  . HYPERLIPIDEMIA, MIXED 03/09/2007  . HYPERTENSION, BENIGN ESSENTIAL 03/09/2007    Orientation RESPIRATION BLADDER Height & Weight     Self, Time, Situation, Place  O2 (2L [place]) Continent Weight: 70 kg (154 lb 4.8 oz) Height:  5\' 2"  (157.5 cm)  BEHAVIORAL SYMPTOMS/MOOD NEUROLOGICAL BOWEL NUTRITION STATUS      Continent Diet (see DC summary)  AMBULATORY STATUS COMMUNICATION OF NEEDS Skin   Limited Assist Verbally Surgical wounds                       Personal Care Assistance Level of Assistance  Bathing, Dressing Bathing Assistance: Limited assistance   Dressing Assistance: Limited assistance     Functional Limitations Info             SPECIAL CARE FACTORS FREQUENCY  PT (By licensed PT), OT (By licensed OT)     PT Frequency: 5/wk OT Frequency: 5/wk            Contractures      Additional Factors Info  Code Status, Allergies, Psychotropic Code Status Info: FULL Allergies Info: Codeine, Penicillins Psychotropic Info: zoloft         Current Medications (07/28/2016):  This is the current hospital active medication list Current Facility-Administered Medications  Medication Dose Route Frequency Provider Last Rate Last Dose  . 0.9 %  sodium chloride infusion   Intravenous Once Chuck Hinthristopher S Dickson, MD      . acetaminophen (TYLENOL) tablet 325-650 mg  325-650 mg Oral Q4H PRN Raymond GurneyKimberly A Trinh, PA-C       Or  . acetaminophen (TYLENOL) suppository 325-650 mg  325-650 mg Rectal Q4H PRN Raymond GurneyKimberly A Trinh, PA-C      . alum & mag hydroxide-simeth (MAALOX/MYLANTA) 200-200-20 MG/5ML suspension 15-30 mL  15-30 mL Oral Q2H PRN Raymond GurneyKimberly A Trinh, PA-C   30 mL at 07/27/16 1730  . atorvastatin (LIPITOR) tablet 40 mg  40 mg Oral q1800 Raymond GurneyKimberly A Trinh, PA-C   40 mg at 07/27/16 1730  . bisacodyl (DULCOLAX) suppository 10 mg  10 mg Rectal Daily PRN Chuck Hinthristopher S Dickson, MD      . busPIRone (BUSPAR) tablet 30 mg  30 mg Per NG tube BID Raymond GurneyKimberly A Trinh, PA-C   30 mg  at 07/28/16 1108  . carvedilol (COREG) tablet 6.25 mg  6.25 mg Oral BID WC Raymond Gurney, PA-C   Stopped at 07/28/16 0730  . docusate sodium (COLACE) capsule 100 mg  100 mg Oral BID PRN Chuck Hint, MD      . enoxaparin (LOVENOX) injection 40 mg  40 mg Subcutaneous Q24H Raymond Gurney, PA-C   40 mg at 07/27/16 1311  . guaiFENesin-dextromethorphan (ROBITUSSIN DM) 100-10 MG/5ML syrup 15 mL  15 mL Oral Q4H PRN Raymond Gurney, PA-C      . hydrALAZINE (APRESOLINE) injection 5 mg  5 mg Intravenous Q20 Min PRN Raymond Gurney, PA-C      . hydrochlorothiazide (HYDRODIURIL) tablet 25 mg  25 mg Oral Daily Raymond Gurney, PA-C   25 mg at 07/27/16 0840  .  ketorolac (TORADOL) 15 MG/ML injection 15 mg  15 mg Intravenous Q6H Kimberly A Trinh, PA-C   15 mg at 07/28/16 0601  . labetalol (NORMODYNE,TRANDATE) injection 10 mg  10 mg Intravenous Q10 min PRN Raymond Gurney, PA-C   10 mg at 07/22/16 1406  . magnesium sulfate IVPB 2 g 50 mL  2 g Intravenous Daily PRN Raymond Gurney, PA-C      . metoprolol (LOPRESSOR) injection 2-5 mg  2-5 mg Intravenous Q2H PRN Raymond Gurney, PA-C   2.5 mg at 07/24/16 0328  . morphine 2 MG/ML injection 2-5 mg  2-5 mg Intravenous Q1H PRN Raymond Gurney, PA-C   2 mg at 07/27/16 2127  . ondansetron (ZOFRAN) injection 4 mg  4 mg Intravenous Q6H PRN Raymond Gurney, PA-C   4 mg at 07/23/16 0353  . oxyCODONE-acetaminophen (PERCOCET/ROXICET) 5-325 MG per tablet 1-2 tablet  1-2 tablet Oral Q4H PRN Chuck Hint, MD   2 tablet at 07/27/16 2223  . pantoprazole (PROTONIX) EC tablet 40 mg  40 mg Oral Daily Raymond Gurney, PA-C   40 mg at 07/28/16 1108  . phenol (CHLORASEPTIC) mouth spray 1 spray  1 spray Mouth/Throat PRN Raymond Gurney, PA-C      . pneumococcal 23 valent vaccine (PNU-IMMUNE) injection 0.5 mL  0.5 mL Intramuscular Prior to discharge Chuck Hint, MD      . potassium chloride SA (K-DUR,KLOR-CON) CR tablet 20-40 mEq  20-40 mEq Oral Daily PRN Raymond Gurney, PA-C      . senna-docusate (Senokot-S) tablet 2 tablet  2 tablet Oral BID Nada Libman, MD   2 tablet at 07/28/16 1108  . sertraline (ZOLOFT) tablet 100 mg  100 mg Per NG tube Q breakfast Raymond Gurney, PA-C   100 mg at 07/28/16 1108  . traZODone (DESYREL) tablet 100 mg  100 mg Per NG tube QHS Raymond Gurney, PA-C   100 mg at 07/27/16 2127     Discharge Medications: Please see discharge summary for a list of discharge medications.  Relevant Imaging Results:  Relevant Lab Results:   Additional Information SS#: [id number]  Burna Sis, LCSW

## 2016-07-28 NOTE — Progress Notes (Signed)
PT Cancellation Note  Patient Details Name: Nicole GivensJanice D Dean MRN: [id number] DOB: [birth year]   Cancelled Treatment:    Reason Eval/Treat Not Completed: Medical issues which prohibited therapy.  Very low BP and awaiting 2 units of blood.  Will check back later as time allows.   Ivar DrapeStout, [place] E 07/28/2016, 9:03 AM    Samul Dadauth Eilah Common, PT MS Acute Rehab Dept. Number: [facility] [id number] [phone] and [facility] [phone]

## 2016-07-28 NOTE — Consult Note (Signed)
Physical Medicine and Rehabilitation Consult  Reason for Consult: Debility Referring Physician: Dr. [name]   HPI: Nicole Dean is a 75 y.o. female with history of CAD--s/p CABG 03/2016, depression, anxiety disorder, chronic SOB, BLE claudication--worse on RLE, AAA with increase in size to 6.0 cm.  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 Post op has had issues with constipation and has had poor po intake.  Noted to have ABLA with drop in Hgb to 7.0 with hypotension and to be transfused with 2 units PRBC today. Therapy ongoing and patient limited by abdominal pain and difficulty with transitional activity.  CIR recommended for follow up therapy.   Lives alone. Has recovered from CABG and was independent without AD/back to driving and performing IADLs.  Daughter in town works but can check in after discharge.    Review of Systems  HENT: Negative for hearing loss.   Eyes: Negative for blurred vision and double vision.  Respiratory: Negative for stridor.   Cardiovascular: Negative for chest pain, palpitations and leg swelling.       BLE feeling better since surgery  Gastrointestinal: Positive for constipation. Negative for heartburn and nausea.       Poor appetite due to bloating.  Genitourinary: Negative for dysuria.  Musculoskeletal: Negative for back pain and myalgias.  Skin: Negative for itching and rash.  Neurological: Positive for weakness. Negative for dizziness, tingling, sensory change and headaches.  Psychiatric/Behavioral: Negative for memory loss. The patient is not nervous/anxious.   All other systems reviewed and are negative.     Past Medical History:  Diagnosis Date  . AAA (abdominal aortic aneurysm) (HCC)    VVS- following   . Anemia   . Anxiety   . Arthritis    hands  . Coronary artery disease   . Depression   . GERD (gastroesophageal reflux disease)   . History of bronchitis   . History of pneumonia     . Hyperlipidemia   . Hypertension   . Numbness and tingling of right leg   . Shortness of breath dyspnea    sometimes even with resting; pt. denies since heart surgery  . Wears glasses     Past Surgical History:  Procedure Laterality Date  . ABDOMINAL AORTIC ANEURYSM REPAIR N/A 07/22/2016   Procedure: JUXTARENAL  ABDOMINAL AORTIC ANEURYSM REPAIR;  Surgeon: Chuck Hint, MD;  Location: [facility] OR;  Service: Vascular;  Laterality: N/A;  . AORTA - BILATERAL FEMORAL ARTERY BYPASS GRAFT N/A 07/22/2016   Procedure: AORTA BIFEMORAL BYPASS GRAFT;  Surgeon: Chuck Hint, MD;  Location: [facility] OR;  Service: Vascular;  Laterality: N/A;  . CARDIAC CATHETERIZATION N/A 03/31/2016   Procedure: Left Heart Cath and Coronary Angiography;  Surgeon: Peter M Swaziland, MD;  Location: [facility] INVASIVE CV LAB;  Service: Cardiovascular;  Laterality: N/A;  . COLONOSCOPY    . CORONARY ARTERY BYPASS GRAFT N/A 04/04/2016   Procedure: CORONARY ARTERY BYPASS GRAFTING (CABG) x 4 using bilateral internal mammary arteries and right saphenous vein harvested by endovein;  Surgeon: Alleen Borne, MD;  Location: [facility] OR;  Service: Open Heart Surgery;  Laterality: N/A;  . EYE SURGERY Bilateral    cataracts removed./ IOL  . PERIPHERAL VASCULAR CATHETERIZATION N/A 03/03/2016   Procedure: Abdominal Aortogram w/Lower Extremity;  Surgeon: Chuck Hint, MD;  Location: [facility] INVASIVE CV LAB;  Service: Cardiovascular;  Laterality: N/A;  . REPAIR ILIAC ARTERY Right 07/22/2016   Procedure: RIGHT COMMON  ILIAC ANEURYSM REPAIR;  Surgeon: Chuck Hint, MD;  Location: [facility] OR;  Service: Vascular;  Laterality: Right;  . SHOULDER ARTHROSCOPY W/ ROTATOR CUFF REPAIR Right   . TEE WITHOUT CARDIOVERSION N/A 04/04/2016   Procedure: TRANSESOPHAGEAL ECHOCARDIOGRAM (TEE);  Surgeon: Alleen Borne, MD;  Location: [facility] OR;  Service: Open Heart Surgery;  Laterality: N/A;    Family History  Problem Relation Age of Onset  . Cancer Father      stomach    Social History:  Lives alone. Retired from [employer] part time-- [employer]. She reports that she quit smoking about 20 years ago. Her smoking use included Cigarettes. She has a 52.50 pack-year smoking history. She has never used smokeless tobacco. She reports that she does not drink alcohol or use drugs.    Allergies  Allergen Reactions  . Codeine Nausea And Vomiting    Severe, ended up in ED  . Penicillins Nausea Only and Other (See Comments)    Has patient had a PCN reaction causing immediate rash, facial/tongue/throat swelling, SOB or lightheadedness with hypotension: Yes Has patient had a PCN reaction causing severe rash involving mucus membranes or skin necrosis: No Has patient had a PCN reaction that required hospitalization No Has patient had a PCN reaction occurring within the last 10 years: No If all of the above answers are "NO", then may proceed with Cephalosporin use.     Medications Prior to Admission  Medication Sig Dispense Refill  . aspirin EC 325 MG EC tablet Take 1 tablet (325 mg total) by mouth daily. 30 tablet 0  . atorvastatin (LIPITOR) 40 MG tablet Take 1 tablet (40 mg total) by mouth daily. (Patient taking differently: Take 40 mg by mouth daily at 6 PM. ) 30 tablet 11  . busPIRone (BUSPAR) 30 MG tablet Take 30 mg by mouth 2 (two) times daily. Reported on 04/01/2016    . carvedilol (COREG) 6.25 MG tablet Take 1 tablet (6.25 mg total) by mouth 2 (two) times daily. 60 tablet 11  . Ferrous Sulfate 28 MG TABS Take 28 mg by mouth daily.    . hydrochlorothiazide (HYDRODIURIL) 25 MG tablet Take 25 mg by mouth daily.  1  . Multiple Vitamins-Iron (MULTIVITAMIN/IRON PO) Take 1 tablet by mouth daily.    Marland Kitchen omeprazole (PRILOSEC) 40 MG capsule Take 40 mg by mouth at bedtime.     . potassium chloride (K-DUR) 10 MEQ tablet Take 1 tablet (10 mEq total) by mouth daily. 30 tablet 11  . sertraline (ZOLOFT) 100 MG tablet Take 100 mg by mouth daily with breakfast.     .  traMADol (ULTRAM) 50 MG tablet Take 1-2 tablets (50-100 mg total) by mouth every 4 (four) hours as needed for moderate pain. 30 tablet 0  . traZODone (DESYREL) 100 MG tablet Take 100 mg by mouth at bedtime.    . ferrous fumarate-b12-vitamic C-folic acid (TRINSICON / FOLTRIN) capsule Take 1 capsule by mouth every morning. (Patient not taking: Reported on 07/15/2016) 30 capsule 1    Home: Home Living Family/patient expects to be discharged to:: Private residence Living Arrangements: Alone Available Help at Discharge: Family, Available 24 hours/day Type of Home: House Home Access: Stairs to enter [employer] of Steps: 5 Entrance Stairs-Rails: Right, Left Home Layout: One level Bathroom Shower/Tub: Engineer, manufacturing systems: Standard Bathroom Accessibility: Yes Home Equipment: Environmental consultant - 2 wheels, Shower seat, Grab bars - tub/shower  Functional History: Prior Function Level of Independence: Independent Comments: pt enjoys baking and gardening. children  can stay with her at D/C Functional Status:  Mobility: Bed Mobility Overal bed mobility: Needs Assistance Bed Mobility: Rolling, Sidelying to Sit Rolling: Mod assist Sidelying to sit: Mod assist General bed mobility comments: Assist to bring legs off and elevate trunk into sitting Transfers Overall transfer level: Needs assistance Equipment used: Rolling walker (2 wheeled) Transfers: Sit to/from Stand Sit to Stand: Mod assist General transfer comment: Assist to bring hips up. Cues to shift weight anteriorly Ambulation/Gait Ambulation/Gait assistance: Min assist Ambulation Distance (Feet): 70 Feet Assistive device: Rolling walker (2 wheeled) Gait Pattern/deviations: Decreased step length - right, Decreased step length - left, Shuffle, Step-through pattern General Gait Details: Cues to stand erect and take bigger steps Gait velocity: decr Gait velocity interpretation: Below normal speed for age/gender     ADL: ADL Overall ADL's : Needs assistance/impaired Eating/Feeding: Set up, Sitting Grooming: Set up, Sitting Upper Body Bathing: Minimal assitance, Sitting Upper Body Bathing Details (indicate cue type and reason): due to pain Lower Body Bathing: Moderate assistance, Sit to/from stand Lower Body Bathing Details (indicate cue type and reason): unable to cross legs over knees Upper Body Dressing : Minimal assistance, Sitting Lower Body Dressing: Maximal assistance, Sit to/from stand, With adaptive equipment, Cueing for compensatory techniques Lower Body Dressing Details (indicate cue type and reason): attempted to practice with AE, but pt unable to tolerate much due to pain Functional mobility during ADLs: Moderate assistance, Rolling walker General ADL Comments: Continues to be experiencing increased pain  Cognition: Cognition Overall Cognitive Status: Within Functional Limits for tasks assessed Orientation Level: Oriented X4 Cognition Arousal/Alertness: Awake/alert Behavior During Therapy: WFL for tasks assessed/performed Overall Cognitive Status: Within Functional Limits for tasks assessed   Blood pressure (!) 126/45, pulse 80, temperature 98.5 F (36.9 C), temperature source Oral, resp. rate (!) 22, height 5\' 2"  (1.575 m), weight 70 kg (154 lb 4.8 oz), SpO2 95 %. Physical Exam  Nursing note and vitals reviewed. Constitutional: She is oriented to person, place, and time. She appears well-developed and well-nourished.  Pale appearing elderly female on 2 L oxygen per [place].  HENT:  Head: Normocephalic and atraumatic.  Mouth/Throat: Oropharynx is clear and moist.  Eyes: Conjunctivae and EOM are normal. Pupils are equal, round, and reactive to light.  Neck: Normal range of motion. Neck supple.  Cardiovascular: Normal rate and regular rhythm.   No murmur heard. Respiratory: Effort normal and breath sounds normal. She has no wheezes. She has no rales.  GI: She exhibits distension.  Bowel sounds are decreased. There is tenderness. There is no rebound.  Midline incision clean, dry and intact.  Musculoskeletal: Normal range of motion. She exhibits edema.  Neurological: She is alert and oriented to person, place, and time. No cranial nerve deficit.  Sensation intact to light touch Motor: 4+/5 grossly throughout  Skin: Skin is warm and dry.  Psychiatric: She has a normal mood and affect. Her behavior is normal. Thought content normal.    Results for orders placed or performed during the hospital encounter of 07/22/16 (from the past 24 hour(s))  CBC     Status: Abnormal   Collection Time: 07/28/16  5:37 AM  Result Value Ref Range   WBC 18.7 (H) 4.0 - 10.5 K/uL   RBC 2.34 (L) 3.87 - 5.11 MIL/uL   Hemoglobin 7.0 (L) 12.0 - 15.0 g/dL   HCT 16.1 (L) 09.6 - 04.5 %   MCV 89.7 78.0 - 100.0 fL   MCH 29.9 26.0 - 34.0 pg   MCHC 33.3  30.0 - 36.0 g/dL   RDW 16.1 09.6 - 04.5 %   Platelets 323 150 - 400 K/uL  Basic metabolic panel     Status: Abnormal   Collection Time: 07/28/16  5:37 AM  Result Value Ref Range   Sodium 133 (L) 135 - 145 mmol/L   Potassium 4.3 3.5 - 5.1 mmol/L   Chloride 98 (L) 101 - 111 mmol/L   CO2 25 22 - 32 mmol/L   Glucose, Bld 89 65 - 99 mg/dL   BUN 12 6 - 20 mg/dL   Creatinine, Ser 4.09 0.44 - 1.00 mg/dL   Calcium 8.5 (L) 8.9 - 10.3 mg/dL   GFR calc non Af Amer >60 >60 mL/min   GFR calc Af Amer >60 >60 mL/min   Anion gap 10 5 - 15  Prepare RBC     Status: None   Collection Time: 07/28/16  8:12 AM  Result Value Ref Range   Order Confirmation ORDER PROCESSED BY BLOOD BANK   Type and screen     Status: None (Preliminary result)   Collection Time: 07/28/16  8:12 AM  Result Value Ref Range   ABO/RH(D) A POS    Antibody Screen PENDING    Sample Expiration 07/31/2016    No results found.  Assessment/Plan: Diagnosis: Debility s/p AAA repair - monitor incision Labs and images independently reviewed.  Records reviewed and summated above.  1. Does  the need for close, 24 hr/day medical supervision in concert with the patient's rehab needs make it unreasonable for this patient to be served in a less intensive setting? Yes 2. Co-Morbidities requiring supervision/potential complications: CAD--s/p CABG 03/2016 (cont meds), depression (ensure mood does not hinder progress of therapies), anxiety disorder (ensure anxiety and resulting apprehension do not limit functional progress; consider prn medications if warranted), chronic SOB (monitor RR and O2 sats with increased activity), BLE claudication (cont meds, monitor for increase in pain with activity), ABLA (transfuse again if necessary to ensure appropriate perfusion for increased activity tolerance), hypotension (monitor HR and BP with increased activity, transfuse again if necessary), tachypnea (monitor RR and O2 Sats with increased physical exertion), leukocytosis (cont to monitor for signs and symptoms of infection, further workup if indicated), hyponatremia (cont to monitor, treat if necessary). 3. Due to bowel management, safety, skin/wound care, disease management, pain management and patient education, does the patient require 24 hr/day rehab nursing? Yes 4. Does the patient require coordinated care of a physician, rehab nurse, PT (1-2 hrs/day, 5 days/week) and OT (1-2 hrs/day, 5 days/week) to address physical and functional deficits in the context of the above medical diagnosis(es)? Yes Addressing deficits in the following areas: balance, endurance, locomotion, strength, transferring, bowel/bladder control, bathing, dressing, toileting and psychosocial support 5. Can the patient actively participate in an intensive therapy program of at least 3 hrs of therapy per day at least 5 days per week? Not at present 6. The potential for patient to make measurable gains while on inpatient rehab is excellent 7. Anticipated functional outcomes upon discharge from inpatient rehab are modified independent  with  PT, modified independent with OT, n/a with SLP. 8. Estimated rehab length of stay to reach the above functional goals is: 10-13 days. 9. Does the patient have adequate social supports and living environment to accommodate these discharge functional goals? Yes 10. Anticipated D/C setting: Home 11. Anticipated post D/C treatments: HH therapy and Home excercise program 12. Overall Rehab/Functional Prognosis: excellent and good  RECOMMENDATIONS: This patient's condition is appropriate for continued  rehabilitative care in the following setting: CIR once medically stable and able to tolerate 3 hours therapy/day. Patient has agreed to participate in recommended program. Yes Note that insurance prior authorization may be required for reimbursement for recommended care.  Comment: Rehab Admissions Coordinator to follow up.  Maryla MorrowAnkit Fraida Veldman, MD 07/28/2016

## 2016-07-28 NOTE — Progress Notes (Signed)
OT Cancellation Note  Patient Details Name: Nicole GivensJanice D Jacuinde MRN: [id number] DOB: March 21, 1941   Cancelled Treatment:    Reason Eval/Treat Not Completed: Medical issues which prohibited therapy. Pt's Hg 7.0, receiving 2nd unit of blood. Pt requested that therapist come back tomorrow as she is exhausted and does not feel like she can safely participate in therapy today. Will re-attempt tomorrow if time allows.  Nils PyleJulia Phillip Sandler, OTR/L Pager: [phone] 07/28/2016, 2:31 PM

## 2016-07-28 NOTE — Progress Notes (Signed)
   VASCULAR SURGERY ASSESSMENT & PLAN:  6 Days Post-Op s/p: Open repair of juxtarenal AAA  WBC increased to 18.7 K. Check U/A. Check CXR. No fever. Abdomen benign. Not tachycardic.   Acute Blood Loss Anemia: transfuse 2 units given her h/o CAD and weakness  Renal function normal  Mobilize. IS  DVT prophylaxis: Lovenox.  SUBJECTIVE: No specific complaints. Passing flatus. No BM. No nausea.  PHYSICAL EXAM: Vitals:   07/27/16 1342 07/27/16 1938 07/27/16 2130 07/28/16 0444  BP: (!) 117/45 (!) 99/36 (!) 101/52 (!) 117/35  Pulse: 98 78  84  Resp: 20 18  18   Temp: 98.1 F (36.7 C) 98.8 F (37.1 C)  98.2 F (36.8 C)  TempSrc: Oral Oral  Oral  SpO2: 98% 95%  91%  Weight:    154 lb 4.8 oz (70 kg)  Height:       Abdomen: soft, non-tender. Normal pitch bowel sounds Lungs clear Mild LE swelling Feet warm  LABS: Lab Results  Component Value Date   WBC 18.7 (H) 07/28/2016   HGB 7.0 (L) 07/28/2016   HCT 21.0 (L) 07/28/2016   MCV 89.7 07/28/2016   PLT 323 07/28/2016   Lab Results  Component Value Date   CREATININE 0.54 07/28/2016   Lab Results  Component Value Date   INR 1.34 07/22/2016   Active Problems:   Abdominal aortic aneurysm without rupture [facility](HCC)  Cari Carawayhris Livan Hires Beeper: [phone] 07/28/2016

## 2016-07-28 NOTE — Progress Notes (Signed)
I will follow up tomorrow with pt to discuss a possible [facility]pt rehab [facility] once pt able to tolerate more therapy. [phone]

## 2016-07-29 LAB — CREATININE, SERUM
CREATININE: 0.55 mg/dL (ref 0.44–1.00)
GFR calc non Af Amer: >60 mL/min (ref >60)

## 2016-07-29 LAB — TYPE AND SCREEN
ABO/RH(D): A POS
Antibody Screen: NEGATIVE
Unit division: 0
Unit division: 0

## 2016-07-29 MED ORDER — FUROSEMIDE 20 MG PO TABS
20.0000 mg | ORAL_TABLET | Freq: Once | ORAL | Status: AC
  Administered 2016-07-29: 20 mg via ORAL
  Filled 2016-07-29: qty 1

## 2016-07-29 MED ORDER — SULFAMETHOXAZOLE-TRIMETHOPRIM 400-80 MG PO TABS
1.0000 | ORAL_TABLET | Freq: Two times a day (BID) | ORAL | Status: DC
  Administered 2016-07-29 – 2016-08-01 (×7): 1 via ORAL
  Filled 2016-07-29 (×7): qty 1

## 2016-07-29 NOTE — Care Management Important Message (Signed)
Important Message  Patient Details  Name: Nicole GivensJanice D Gentry MRN: [id number] Date of Birth: [birth year]   Medicare Important Message Given:  Yes    Monai Hindes Abena 07/29/2016, 9:15 AM

## 2016-07-29 NOTE — Progress Notes (Signed)
I met with Nicole Dean at bedside to discuss a possible inpt rehab admission pending insurance approval when Nicole Dean medically ready. I also spoke with her daughter, Donella Stade, by phone who prefers CIR rather than SNF. Her daughter is a Tourist information centre manager at [employer]. Nicole Dean states she believes the Toradol IV is making her feel sick today. I spoke with RN to request med change. I will follow up tomorrow. [phone]

## 2016-07-29 NOTE — Progress Notes (Signed)
   VASCULAR SURGERY ASSESSMENT & PLAN:  7 Days Post-Op s/p: Open repair of juxtarenal AAA  WBC increased to 11 K. U/A with increased WBC and some bacteria. Will get urine culture and then start Bactim. Nicole Dean. No fever. Abdomen benign. Incisions look fine. Not tachycardic.   Acute Blood Loss Anemia: resolved after 2 Units PRBC's.  Renal function normal  Mobilize. IS. Has bibasilar ATx  DVT prophylaxis: Lovenox.  SUBJECTIVE: No specific complaints.  PHYSICAL EXAM: Vitals:   07/28/16 1530 07/28/16 1948 07/29/16 0215 07/29/16 0423  BP: (!) 144/46 (!) 119/49  (!) 128/49  Pulse: 81 83  90  Resp: 20 18  18   Temp: 98.9 F (37.2 C) 98.4 F (36.9 C)  99.1 F (37.3 C)  TempSrc: Oral Oral  Oral  SpO2: 95% 95%  96%  Weight:   153 lb 12.8 oz (69.8 kg)   Height:       Incisions look fine Abdomen: normal pitch BS Lungs clear LE edema  LABS: Lab Results  Component Value Date   WBC 21.7 (H) 07/28/2016   HGB 10.2 (L) 07/28/2016   HCT 30.4 (L) 07/28/2016   MCV 87.1 07/28/2016   PLT 368 07/28/2016   Lab Results  Component Value Date   CREATININE 0.55 07/29/2016    CXR: Bilateral pleural effusions with right basilar atelectasis. No focal airspace disease.  U/A: Few Bacteria. 6-30 WBC  Active Problems:   Abdominal aortic aneurysm without rupture (HCC)   Status post AAA (abdominal aortic aneurysm) repair   Coronary artery disease involving coronary bypass graft of native heart without angina pectoris   Depression   Anxiety state   SOB (shortness of breath)   Atherosclerosis of native artery of both lower extremities with intermittent claudication (HCC)   Acute blood loss anemia   Hypotension   Tachypnea   Leukocytosis   Hyponatremia   Cari CarawayChris Valine Drozdowski Beeper: [phone] 07/29/2016

## 2016-07-29 NOTE — Progress Notes (Signed)
Occupational Therapy Treatment Patient Details Name: Nicole GivensJanice D Dean MRN: [id number] DOB: 1941/06/15 Today's Date: 07/29/2016    History of present illness 75 yo admitted for AAA repair. PMhx: anxiety, arthritis, CAD, GERD, HTN, HLD   OT comments  Pt progressing very well towards OT goals. Pt able to complete grooming tasks at sink and toileting tasks with min guard assist and setup for necessary items. Pt's SpO2 77% on RA after completing ADL tasks for ~10 minutes and returned to 2L O2. Feel pt will benefit significantly from intensive therapy stay in CIR venue. Will continue to follow pt acutely to address OT needs and goals.   Follow Up Recommendations  CIR    Equipment Recommendations  3 in 1 bedside comode    Recommendations for Other Services      Precautions / Restrictions Precautions Precautions: Fall Precaution Comments: watch sats Restrictions Weight Bearing Restrictions: No       Mobility Bed Mobility Overal bed mobility: Needs Assistance Bed Mobility: Sidelying to Sit;Sit to Sidelying Rolling: Supervision Sidelying to sit: Supervision     Sit to sidelying: Supervision General bed mobility comments: Supervision for safety.  Transfers Overall transfer level: Needs assistance Equipment used: Rolling walker (2 wheeled) Transfers: Sit to/from Stand Sit to Stand: Min guard         General transfer comment: Min guard assist for safety. Good demonstration of safe hand placement on seated surfaces.    Balance Overall balance assessment: Needs assistance Sitting-balance support: No upper extremity supported;Feet supported Sitting balance-Leahy Scale: Fair Sitting balance - Comments: able to sit without UE support   Standing balance support: No upper extremity supported;During functional activity Standing balance-Leahy Scale: Fair Standing balance comment: able to maintain balance without UE support to complete pericare and grooming tasks.                   ADL Overall ADL's : Needs assistance/impaired Eating/Feeding: Set up;Sitting   Grooming: Wash/dry face;Wash/dry hands;Oral care;Set up;Standing   Upper Body Bathing: Set up;Sitting   Lower Body Bathing: Minimal assistance;Sit to/from stand Lower Body Bathing Details (indicate cue type and reason): to reach lower body Upper Body Dressing : Set up;Sitting   Lower Body Dressing: Minimal assistance;Sit to/from stand Lower Body Dressing Details (indicate cue type and reason): to reach lower body Toilet Transfer: Min guard;Ambulation;BSC;RW   Toileting- ArchitectClothing Manipulation and Hygiene: Min guard;Sit to/from stand       Functional mobility during ADLs: Min guard;Rolling walker General ADL Comments: Gradually able to bend more and more to reach lower body - likely will not require AE      Vision                     Perception     Praxis      Cognition   Behavior During Therapy: [facility] for tasks assessed/performed Overall Cognitive Status: Within Functional Limits for tasks assessed                       Extremity/Trunk Assessment               Exercises     Shoulder Instructions       General Comments      Pertinent Vitals/ Pain      Pain Assessment: 0-10          Pain Score: 5           Faces Pain Scale: Hurts little more  Pain Location: abdomen/incision site          Pain Descriptors / Indicators: Sore;Discomfort          Pain Intervention(s): Limited activity within patient's tolerance;Monitored during session;Repositioned           2L O2 [place] in supine: SpO2 95%          RA sitting EOB: SpO2 93%          RA after 10 minutes of ADL activitiy: SpO2 77%          2L O2 [place] supine after 3 minutes: SpO2 88%  Home Living                                          Prior Functioning/Environment              Frequency Min 2X/week     Progress Toward Goals  OT Goals(current goals can now be found in the care  plan section)  Progress towards OT goals: Progressing toward goals  Acute Rehab OT Goals Patient Stated Goal: return home to baking OT Goal Formulation: With patient Time For Goal Achievement: 08/06/16 Potential to Achieve Goals: Good ADL Goals Pt Will Perform Lower Body Bathing: with supervision;sit to/from stand;with adaptive equipment;with set-up Pt Will Perform Lower Body Dressing: with set-up;with supervision;sit to/from stand;with adaptive equipment Pt Will Transfer to Toilet: with supervision;ambulating;bedside commode Pt Will Perform Toileting - Clothing Manipulation and hygiene: with modified independence;sit to/from stand;sitting/lateral leans Additional ADL Goal #1: Pt will verbalize 3 energy conservation techniques for ADL  Plan Discharge plan needs to be updated    Co-evaluation                 End of Session Equipment Utilized During Treatment: Rolling walker;Gait belt   Activity Tolerance Patient tolerated treatment well   Patient Left in bed;with call bell/phone within reach;with nursing/sitter in room   Nurse Communication Mobility status;Other (comment) (O2 sats dropped on RA)        Time: 0811-0826 OT Time Calculation (min): 15 min  Charges: OT General Charges $OT Visit: 1 Procedure OT Treatments $Self Care/Home Management : 8-22 mins  Nils Pyle, OTR/L Pager: [phone] 07/29/2016, 11:39 AM

## 2016-07-29 NOTE — Progress Notes (Signed)
Physical Therapy Treatment Patient Details Name: Nicole GivensJanice D Dean MRN: [id number] DOB: 11-10-1941 Today's Date: 07/29/2016    History of Present Illness 75 yo admitted for AAA repair. PMhx: anxiety, arthritis, CAD, GERD, HTN, HLD    PT Comments    Patient progressing with transfers and bed mobility with less assist needed.  Still with limited activity tolerance, but today with complication of diarrhea.  Feel pt will continue to benefit from skilled PT in the acute setting to allow d/c home following CIR level rehab.  Though limited tolerating more this session and likely can tolerate schedule for CIR if broken up throughout the day.   Follow Up Recommendations  CIR     Equipment Recommendations  3in1 (PT)    Recommendations for Other Services       Precautions / Restrictions Precautions Precautions: Fall Precaution Comments: watch sats    Mobility  Bed Mobility Overal bed mobility: Needs Assistance   Rolling: Supervision Sidelying to sit: Min assist       General bed mobility comments: assist lifting trunk, to supine with Supervision and cues for positioning  Transfers Overall transfer level: Needs assistance Equipment used: Rolling walker (2 wheeled) Transfers: Sit to/from Stand Sit to Stand: Min assist         General transfer comment: assist for balance, safety   Ambulation/Gait Ambulation/Gait assistance: Min assist Ambulation Distance (Feet): 90 Feet (and 12' x 2 to use the bathroom) Assistive device: Rolling walker (2 wheeled) Gait Pattern/deviations: Step-through pattern;Shuffle;Decreased stride length;Narrow base of support     General Gait Details: cues for walker safety with turns, for step length   Stairs            Wheelchair Mobility    Modified Rankin (Stroke Patients Only)       Balance Overall balance assessment: Needs assistance Sitting-balance support: Single extremity supported;No upper extremity supported;Feet  supported Sitting balance-Leahy Scale: Fair Sitting balance - Comments: able to sit without UE support   Standing balance support: During functional activity Standing balance-Leahy Scale: Poor Standing balance comment: minguard for balance while washing hands at sink; able also to reach for soap with minguard for safety                    Cognition Arousal/Alertness: Awake/alert Behavior During Therapy: WFL for tasks assessed/performed Overall Cognitive Status: Within Functional Limits for tasks assessed                      Exercises      General Comments General comments (skin integrity, edema, etc.): SpO2 after walking in room no O2 90% with DOE 3/4.  Applied 2L O2 for hallway ambulation with SpO2 95% after walking and DOE still 3/4. HR with amb 98      Pertinent Vitals/Pain Pain Assessment: Faces Faces Pain Scale: Hurts little more Pain Location: abdomen Pain Descriptors / Indicators: Discomfort Pain Intervention(s): Monitored during session;Repositioned    Home Living                      Prior Function            PT Goals (current goals can now be found in the care plan section) Progress towards PT goals: Progressing toward goals    Frequency  Min 3X/week    PT Plan Current plan remains appropriate    Co-evaluation             End of Session Equipment Utilized  During Treatment: Gait belt;Oxygen Activity Tolerance: Other (comment) (limited due to diarrhea) Patient left: in bed;with call bell/phone within reach     Time: 0937-1002 PT Time Calculation (min) (ACUTE ONLY): 25 min  Charges:  $Gait Training: 23-37 mins                    G Codes:      Nicole Dean 04-Aug-2016, 10:33 AM  Sheran Lawless, PT [phone] 2016/08/04

## 2016-07-30 LAB — CBC
HCT: 28.6 % — ABNORMAL LOW (ref 36.0–46.0)
Hemoglobin: 9.1 g/dL — ABNORMAL LOW (ref 12.0–15.0)
MCH: 28.3 pg (ref 26.0–34.0)
MCHC: 31.8 g/dL (ref 30.0–36.0)
MCV: 88.8 fL (ref 78.0–100.0)
PLATELETS: 375 10*3/uL (ref 150–400)
RBC: 3.22 MIL/uL — ABNORMAL LOW (ref 3.87–5.11)
RDW: 17.2 % — AB (ref 11.5–15.5)
WBC: 14.7 10*3/uL — ABNORMAL HIGH (ref 4.0–10.5)

## 2016-07-30 LAB — BASIC METABOLIC PANEL
Anion gap: 9 (ref 5–15)
BUN: 14 mg/dL (ref 6–20)
CALCIUM: 8 mg/dL — AB (ref 8.9–10.3)
CO2: 27 mmol/L (ref 22–32)
Chloride: 100 mmol/L — ABNORMAL LOW (ref 101–111)
Creatinine, Ser: 0.46 mg/dL (ref 0.44–1.00)
GFR calc Af Amer: >60 mL/min (ref >60)
GLUCOSE: 89 mg/dL (ref 65–99)
Potassium: 3.7 mmol/L (ref 3.5–5.1)
Sodium: 136 mmol/L (ref 135–145)

## 2016-07-30 MED ORDER — FERROUS SULFATE 28 MG PO TABS
28.0000 mg | ORAL_TABLET | Freq: Every day | ORAL | Status: DC
Start: 2016-07-30 — End: 2016-07-30

## 2016-07-30 MED ORDER — FERROUS SULFATE 325 (65 FE) MG PO TABS
325.0000 mg | ORAL_TABLET | Freq: Every day | ORAL | Status: DC
  Administered 2016-07-30 – 2016-08-01 (×3): 325 mg via ORAL
  Filled 2016-07-30 (×3): qty 1

## 2016-07-30 MED ORDER — ASPIRIN EC 325 MG PO TBEC
325.0000 mg | DELAYED_RELEASE_TABLET | Freq: Every day | ORAL | Status: DC
  Administered 2016-07-30 – 2016-08-01 (×3): 325 mg via ORAL
  Filled 2016-07-30 (×3): qty 1

## 2016-07-30 NOTE — Progress Notes (Signed)
I have begun insurance authorization for a possible inpt rehab admission pending approval and bed availability. [phone]

## 2016-07-30 NOTE — Progress Notes (Addendum)
  [facility] Progress Note  Subjective  - POD #8  Feels the best she's felt all admission. Had diarrhea yesterday. No melena or hematochezia. Denies abd pain, fever, chills.   Objective Vitals:   07/29/16 2028 07/30/16 0500  BP: (!) 110/46 (!) 137/55  Pulse: 75 85  Resp: 18 18  Temp: 98.8 F (37.1 C) 99.3 F (37.4 C)    Intake/Output Summary (Last 24 hours) at 07/30/16 [id number] Last data filed at 07/30/16 [id number]  Gross per 24 hour  Intake              360 ml  Output              275 ml  Net               85 ml   CV RRR Lungs clear Abdomen soft, no distension, mild tenderness to palpation right Incisions clean Palpable DP pulses b/l  Assessment/Planning: 75 y.o. female is s/p: open repair juxtarenal AAA 8 Days Post-Op   Feeling much better today. Has an appetite today. Leukocytosis improved to 14.7 today. Was 21K yesterday. Await urine culture. Incisions are clean and abdominal exam essentially benign. Had one episdoe of diarrhea. Will trend WBC.  Renal function normal.  Hgb stable.  Continue to mobilize. Encourage IS.  Potentially d/c to CIR tomorrow.   Raymond GurneyKimberly A Trinh 07/30/2016 7:33 AM --  Laboratory CBC    Component Value Date/Time   WBC 14.7 (H) 07/30/2016 0236   HGB 9.1 (L) 07/30/2016 0236   HCT 28.6 (L) 07/30/2016 0236   PLT 375 07/30/2016 0236    BMET    Component Value Date/Time   NA 136 07/30/2016 0236   K 3.7 07/30/2016 0236   CL 100 (L) 07/30/2016 0236   CO2 27 07/30/2016 0236   GLUCOSE 89 07/30/2016 0236   BUN 14 07/30/2016 0236   CREATININE 0.46 07/30/2016 0236   CREATININE 0.62 06/04/2016 0958   CALCIUM 8.0 (L) 07/30/2016 0236   GFRNONAA >60 07/30/2016 0236   GFRAA >60 07/30/2016 0236    COAG  No results found for: PTT  Antibiotics Anti-infectives    Start     Dose/Rate Route Frequency Ordered Stop   07/29/16 1000  sulfamethoxazole-trimethoprim (BACTRIM,SEPTRA) 400-80 MG per tablet 1 tablet     1 tablet Oral  Every 12 hours 07/29/16 0730     07/22/16 1930  vancomycin (VANCOCIN) IVPB 1000 mg/200 mL premix  Status:  Discontinued     1,000 mg 200 mL/hr over 60 Minutes Intravenous Every 12 hours 07/22/16 1508 07/22/16 1535   07/22/16 1930  vancomycin (VANCOCIN) 500 mg in sodium chloride 0.9 % 100 mL IVPB     500 mg 100 mL/hr over 60 Minutes Intravenous Every 12 hours 07/22/16 1535 07/23/16 0806   07/22/16 0630  vancomycin (VANCOCIN) IVPB 1000 mg/200 mL premix     1,000 mg 200 mL/hr over 60 Minutes Intravenous To ShortStay [facility] 07/21/16 2015 07/22/16 0820     Maris BergerKimberly Trinh, PA-C [facility] Office: [phone] Pager: [phone] 07/30/2016 7:33 AM  I have interviewed the patient and examined the patient. I agree with the findings by the PA. If has any more diarrhea, then will check c diff.  Looks like she has turned the corner.   Cari Carawayhris Dickson, MD [phone]

## 2016-07-30 NOTE — Discharge Summary (Signed)
[facility] AAA Discharge Summary  Nicole Dean 1941-07-31 75 y.o. female  [id number]  Admission Date: 07/22/2016  Discharge Date: 08/01/2016  Physician: No att. providers found  Admission Diagnosis: abdominal aortic aneurysm  HPI:   This is a 75 y.o. female who Dr. [name] originally saw back in 2015 with a 5.8 cm infrarenal abdominal aortic aneurysm. Dr. [name] recommended elective repair however she did not wish to proceed with repair at that time. The aneurysm was followed by her primary care physician and that it is enlarged to 6.0 cm. She was then agreeable to consider elective repair. 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 The aneurysm also extends up near the renal arteries. For these reasons she was not a candidate for endovascular repair of her aneurysm. Of note, she also has a circumaortic left renal vein.   She underwent preoperative cardiac evaluation and was found to have significant coronary artery disease. She required coronary revascularization by Dr. Laneta Simmers. She had a CABG 4 on 04/04/2016. It was felt that she should wait 3 months after her CABG before considering aneurysm repair. She has also been seen by Dr. Elease Hashimoto and has been cleared for open repair of her aneurysm.  She has done well since her heart surgery and denies any chest pain or chest pressure. She has been gradually resuming her normal activities. She denies abdominal pain or back pain.  She does have bilateral lower extremity claudication which is more significant on the right side. This involves her calves and thighs bilaterally. Her pain is brought on by ambulation and relieved with rest. She denies any history of rest pain.  She is on aspirin and is on a statin.  Hospital Course:  The patient was admitted to the hospital and taken to the operating room on 07/22/2016 and underwent: open repair of juxtarenal abdominal aortic  aneurysm with aortobifemoral bypass graft (18 x 9 mm Dacron)  The patient tolerated the procedure well and was extubated in the OR. She was transported to the PACU in stable condition.   POD 1: The patient was hemodynamically stable with normal renal function and good urine output. Her NG tube had 200 cc output and was discontinued. She was kept NPO. She was transferred out of the ICU.   POD 2: The patient had some nausea and was not passing flatus. She was kept NPO. She continued to have good urine output.   POD 3: Started on full liquids.   POD 4: Advanced to regular diet. Started on toradol as her pain was not well controlled on percocet and morphine. Had small BM. Inpatient rehab consulted.   POD 5: ABLA with Hgb of 7.4. This was down from 11.3 4 days prior. CBC ordered for following day. IVF discontinued.   POD 6: Was transfused 2 units pRBCs for ABLA given history of CAD and weakness. Renal function continued to be normal. WBC increased to 18.7 K. UA and CXR ordered. Was afebrile with benign abdominal exam. No tachycardia.  POD 7: WBC continued to increase but remained afebrile. Her abdomen remained benign and incisions were clean. UA showed increased WBC and bacteria. A urine culture was ordered. CXR showing bibasilar atelectasis. Was encouraged to use incentive spirometry. Her ABLA was resolved after 2 units of pRBCs.   POD 8: The patient was feeling better that she had all admission. Her WBC improved to 14K from 21K the day before. Urine culture was pending. She was  started on bactrim for UTI. She remained afebrile with a benign abdomen and clean incisions. She did have one episode of diarrhea. If she had another episode, then would order c diff. She was encouraged to continue to mobilize and use incentive spirometry.   POD 9: Her appetite was improved. Her leukocytosis remained stable at 14 K. Her urine cultures were still pending. Her incisions continued heal well. Her pain was well  controlled and she was tolerating a diet. She was weaned off of oxygen.   POD 10: The patient's urine culture was sensitive to bactrim. CIR was not approved by her insurance. She was discharged home with home health services on POD 10 in good condition.   CBC    Component Value Date/Time   WBC 14.9 (H) 07/31/2016 0154   RBC 3.27 (L) 07/31/2016 0154   HGB 9.3 (L) 07/31/2016 0154   HCT 29.4 (L) 07/31/2016 0154   PLT 383 07/31/2016 0154   MCV 89.9 07/31/2016 0154   MCH 28.4 07/31/2016 0154   MCHC 31.6 07/31/2016 0154   RDW 16.8 (H) 07/31/2016 0154   LYMPHSABS 1,820 03/28/2016 1044   MONOABS 420 03/28/2016 1044   EOSABS 70 03/28/2016 1044   BASOSABS 0 03/28/2016 1044    BMET    Component Value Date/Time   NA 133 (L) 07/31/2016 0154   K 3.3 (L) 07/31/2016 0154   CL 99 (L) 07/31/2016 0154   CO2 28 07/31/2016 0154   GLUCOSE 92 07/31/2016 0154   BUN 11 07/31/2016 0154   CREATININE 0.45 07/31/2016 0154   CREATININE 0.62 06/04/2016 0958   CALCIUM 7.8 (L) 07/31/2016 0154   GFRNONAA >60 07/31/2016 0154   GFRAA >60 07/31/2016 0154     Discharge Instructions:   The patient is discharged to home with extensive instructions on wound care and progressive ambulation. They are instructed not to drive or perform any heavy lifting until returning to see the physician in his office.  Discharge Instructions    Call MD for:  redness, tenderness, or signs of infection (pain, swelling, bleeding, redness, odor or green/yellow discharge around incision site)    Complete by:  As directed   Call MD for:  severe or increased pain, loss or decreased feeling  in affected limb(s)    Complete by:  As directed   Call MD for:  temperature >100.5    Complete by:  As directed   Discharge instructions    Complete by:  As directed   You may shower daily as needed   Driving Restrictions    Complete by:  As directed   No driving for 4 weeks   Increase activity slowly    Complete by:  As directed   Walk with  assistance use walker or cane as needed   Lifting restrictions    Complete by:  As directed   No lifting for 6 weeks   Resume previous diet    Complete by:  As directed      Discharge Diagnosis:  abdominal aortic aneurysm  Secondary Diagnosis: Patient Active Problem List   Diagnosis Date Noted  . Status post AAA (abdominal aortic aneurysm) repair   . Coronary artery disease involving coronary bypass graft of native heart without angina pectoris   . Depression   . Anxiety state   . SOB (shortness of breath)   . Atherosclerosis of native artery of both lower extremities with intermittent claudication (HCC)   . Acute blood loss anemia   . Hypotension   .  Tachypnea   . Leukocytosis   . Hyponatremia   . Abdominal aortic aneurysm without rupture (HCC) 07/22/2016  . CAD (coronary artery disease) 06/04/2016  . 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 Abnormal nuclear stress test 03/31/2016  . AAA (abdominal aortic aneurysm) (HCC) 03/25/2016  . Atherosclerotic peripheral vascular disease (HCC) 03/03/2016  . Depression with anxiety 08/09/2012  . DEGENERATIVE JOINT DISEASE 01/30/2010  . OSTEOPENIA 12/08/2007  . INSOMNIA, CHRONIC 08/30/2007  . DISORDER, DEPRESSIVE NEC 08/30/2007  . GERD 08/30/2007  . DIVERTICULAR DISEASE 08/30/2007  . HYPERLIPIDEMIA, MIXED 03/09/2007  . HYPERTENSION, BENIGN ESSENTIAL 03/09/2007   Past Medical History:  Diagnosis Date  . AAA (abdominal aortic aneurysm) (HCC)    [facility]- following   . Anemia   . Anxiety   . Arthritis    hands  . Coronary artery disease   . Depression   . GERD (gastroesophageal reflux disease)   . History of bronchitis   . History of pneumonia   . Hyperlipidemia   . Hypertension   . Numbness and tingling of right leg   . Shortness of breath dyspnea    sometimes even with resting; pt. denies since heart surgery  . Wears glasses        Medication List    TAKE these medications   aspirin 325 MG EC tablet Take 1 tablet (325 mg total) by  mouth daily.   atorvastatin 40 MG tablet Commonly known as:  LIPITOR Take 1 tablet (40 mg total) by mouth daily. What changed:  when to take this   busPIRone 30 MG tablet Commonly known as:  BUSPAR Take 30 mg by mouth 2 (two) times daily. Reported on 04/01/2016   carvedilol 6.25 MG tablet Commonly known as:  COREG Take 1 tablet (6.25 mg total) by mouth 2 (two) times daily.   ferrous fumarate-b12-vitamic C-folic acid capsule Commonly known as:  TRINSICON / FOLTRIN Take 1 capsule by mouth every morning.   Ferrous Sulfate 28 MG Tabs Take 28 mg by mouth daily.   hydrochlorothiazide 25 MG tablet Commonly known as:  HYDRODIURIL Take 25 mg by mouth daily.   MULTIVITAMIN/IRON PO Take 1 tablet by mouth daily.   omeprazole 40 MG capsule Commonly known as:  PRILOSEC Take 40 mg by mouth at bedtime.   oxyCODONE-acetaminophen 5-325 MG tablet Commonly known as:  PERCOCET/ROXICET Take 1-2 tablets by mouth every 4 (four) hours as needed for moderate pain.   potassium chloride 10 MEQ tablet Commonly known as:  K-DUR Take 1 tablet (10 mEq total) by mouth daily.   traMADol 50 MG tablet Commonly known as:  ULTRAM Take 1-2 tablets (50-100 mg total) by mouth every 4 (four) hours as needed for moderate pain.   traZODone 100 MG tablet Commonly known as:  DESYREL Take 100 mg by mouth at bedtime.   ZOLOFT 100 MG tablet Generic drug:  sertraline Take 100 mg by mouth daily with breakfast.       Disposition: home  Patient's condition: is Good  Follow up: 1. Dr. Edilia Boickson in 2 weeks   Maris BergerKimberly Meosha Castanon, PA-C [facility] [phone] 08/05/2016  9:14 AM   - For [facility]QI Registry use ---   Post-op:  Time to Extubation: [x ] In OR, [ ]  < 12 hrs, [ ]  12-24 hrs, [ ]  >=24 hrs Vasopressors Req. Post-op: No ICU Stay: 1 day Transfusion: Yes  If yes, 2 units given MI: No, [ ]  Troponin only, [ ]  EKG or Clinical New Arrhythmia: No  Complications: CHF: No Resp failure: No,  [ ]  Pneumonia, [ ]  Ventilator Chg in renal function: No, [ ]  Inc. Cr > 0.5, [ ]  Temp. Dialysis, [ ]  Permanent dialysis Leg ischemia: No, no Surgery needed, [ ]  Yes, Surgery needed, [ ]  Amputation Bowel ischemia: No, [ ]  Medical Rx, [ ]  Surgical Rx Wound complication: No, [ ]  Superficial separation/infection, [ ]  Return to OR Return to OR: No  Return to OR for bleeding: No Stroke: No, [ ]  Minor, [ ]  Major  Discharge medications: Statin use:  Yes If No: [ ]  For Medical reasons, [ ]  Non-compliant ASA use:  Yes  If No: [ ]  For Medical reasons, [ ]  Non-compliant Plavix use:  No If No: [ ]  For Medical reasons, [ ]  Non-compliant Beta blocker use:  Yes If No: [ ]  For Medical reasons, [ ]  Non-compliant

## 2016-07-31 LAB — CBC
HCT: 29.4 % — ABNORMAL LOW (ref 36.0–46.0)
HEMOGLOBIN: 9.3 g/dL — AB (ref 12.0–15.0)
MCH: 28.4 pg (ref 26.0–34.0)
MCHC: 31.6 g/dL (ref 30.0–36.0)
MCV: 89.9 fL (ref 78.0–100.0)
Platelets: 383 10*3/uL (ref 150–400)
RBC: 3.27 MIL/uL — AB (ref 3.87–5.11)
RDW: 16.8 % — ABNORMAL HIGH (ref 11.5–15.5)
WBC: 14.9 10*3/uL — ABNORMAL HIGH (ref 4.0–10.5)

## 2016-07-31 LAB — URINE CULTURE: Culture: >=100000 — AB

## 2016-07-31 LAB — BASIC METABOLIC PANEL
ANION GAP: 6 (ref 5–15)
BUN: 11 mg/dL (ref 6–20)
CHLORIDE: 99 mmol/L — AB (ref 101–111)
CO2: 28 mmol/L (ref 22–32)
Calcium: 7.8 mg/dL — ABNORMAL LOW (ref 8.9–10.3)
Creatinine, Ser: 0.45 mg/dL (ref 0.44–1.00)
GFR calc Af Amer: >60 mL/min (ref >60)
GFR calc non Af Amer: >60 mL/min (ref >60)
GLUCOSE: 92 mg/dL (ref 65–99)
POTASSIUM: 3.3 mmol/L — AB (ref 3.5–5.1)
Sodium: 133 mmol/L — ABNORMAL LOW (ref 135–145)

## 2016-07-31 MED ORDER — POTASSIUM CHLORIDE CRYS ER 20 MEQ PO TBCR
20.0000 meq | EXTENDED_RELEASE_TABLET | Freq: Two times a day (BID) | ORAL | Status: DC
  Administered 2016-07-31 – 2016-08-01 (×3): 20 meq via ORAL
  Filled 2016-07-31 (×3): qty 1

## 2016-07-31 MED ORDER — FUROSEMIDE 20 MG PO TABS: 20.0000 mg | ORAL_TABLET | Freq: Every day | ORAL | Status: DC

## 2016-07-31 MED ORDER — FUROSEMIDE 10 MG/ML IJ SOLN
20.0000 mg | Freq: Once | INTRAMUSCULAR | Status: AC
  Administered 2016-07-31: 20 mg via INTRAVENOUS
  Filled 2016-07-31: qty 2

## 2016-07-31 NOTE — Progress Notes (Signed)
Insurance has not made decision on admit to inpt rehab yet. RN Cm and Daughter are aware. [phone]

## 2016-07-31 NOTE — Progress Notes (Addendum)
  [facility] Progress Note  Subjective  - POD #9  Continuing to feel better. Denies nausea. No diarrhea.   Objective Vitals:   07/30/16 2100 07/31/16 0300  BP: (!) 111/48 (!) 113/51  Pulse: 71 80  Resp: 18 18  Temp: 98.4 F (36.9 C) 98.6 F (37 C)    Intake/Output Summary (Last 24 hours) at 07/31/16 0719 Last data filed at 07/30/16 1134  Gross per 24 hour  Intake              240 ml  Output              300 ml  Net              -60 ml   Abdomen soft with very mild tenderness to palpation diffusely.  Midline incision and groin incisions clean. 2+ DP pulses b/l  Assessment/Planning: 75 y.o. female is s/p: open repair juxtarenal AAA 9 Days Post-Op   Starting to eat more.  Leukocytosis stable at 14. Bactrim started yesterday for UTI. Cultures still pending. No further diarrhea. No other clinical signs of infection.  Renal function is fine.  ABLA stable.  Continue ambulation and IS.  CIR authorization pending.  Potential d/c to CIR today.   Raymond GurneyKimberly A Trinh 07/31/2016 7:19 AM --  Laboratory CBC    Component Value Date/Time   WBC 14.9 (H) 07/31/2016 0154   HGB 9.3 (L) 07/31/2016 0154   HCT 29.4 (L) 07/31/2016 0154   PLT 383 07/31/2016 0154    BMET    Component Value Date/Time   NA 133 (L) 07/31/2016 0154   K 3.3 (L) 07/31/2016 0154   CL 99 (L) 07/31/2016 0154   CO2 28 07/31/2016 0154   GLUCOSE 92 07/31/2016 0154   BUN 11 07/31/2016 0154   CREATININE 0.45 07/31/2016 0154   CREATININE 0.62 06/04/2016 0958   CALCIUM 7.8 (L) 07/31/2016 0154   GFRNONAA >60 07/31/2016 0154   GFRAA >60 07/31/2016 0154    COAG Lab Results  Component Value Date   INR 1.34 07/22/2016   INR 1.03 07/16/2016   INR 1.52 (H) 04/04/2016   No results found for: PTT  Antibiotics Anti-infectives    Start     Dose/Rate Route Frequency Ordered Stop   07/29/16 1000  sulfamethoxazole-trimethoprim (BACTRIM,SEPTRA) 400-80 MG per tablet 1 tablet     1 tablet Oral  Every 12 hours 07/29/16 0730     07/22/16 1930  vancomycin (VANCOCIN) IVPB 1000 mg/200 mL premix  Status:  Discontinued     1,000 mg 200 mL/hr over 60 Minutes Intravenous Every 12 hours 07/22/16 1508 07/22/16 1535   07/22/16 1930  vancomycin (VANCOCIN) 500 mg in sodium chloride 0.9 % 100 mL IVPB     500 mg 100 mL/hr over 60 Minutes Intravenous Every 12 hours 07/22/16 1535 07/23/16 0806   07/22/16 0630  vancomycin (VANCOCIN) IVPB 1000 mg/200 mL premix     1,000 mg 200 mL/hr over 60 Minutes Intravenous To ShortStay Surgical 07/21/16 2015 07/22/16 0820     Maris BergerKimberly Trinh, PA-C [facility] Office: [phone] Pager: [phone] 07/31/2016 7:19 AM   I have interviewed the patient and examined the patient. I agree with the findings by the PA. Hypokalemia: supplement  Cari Carawayhris Dickson, MD [phone]

## 2016-07-31 NOTE — Progress Notes (Signed)
Physical Therapy Treatment Patient Details Name: Nicole Dean MRN: [id number] DOB: [birth year] Today's Date: 07/31/2016    History of Present Illness 75 yo admitted for AAA repair. PMhx: anxiety, arthritis, CAD, GERD, HTN, HLD    PT Comments    Patient progressing with ambulation/acitivity tolerance, with safety with transfers and balance activities.  Continue to recommend inpatient rehab prior to d/c due to complex medical issues, extended hospitalization and to allow return home with intermittent assist as she was living independent prior to admission.   Follow Up Recommendations  CIR     Equipment Recommendations  3in1 (PT)    Recommendations for Other Services       Precautions / Restrictions Precautions Precautions: Fall    Mobility  Bed Mobility               General bed mobility comments: up in chair  Transfers Overall transfer level: Needs assistance Equipment used: Rolling walker (2 wheeled) Transfers: Sit to/from Stand Sit to Stand: Min guard;Supervision         General transfer comment: depending on chair height, demonstrates appropriate safety  Ambulation/Gait Ambulation/Gait assistance: Min guard Ambulation Distance (Feet): 120 Feet Assistive device: Rolling walker (2 wheeled) Gait Pattern/deviations: Step-through pattern;Decreased stride length;Narrow base of support     General Gait Details: assist/cues for balance, proximity to walker and safety, able    Stairs            Wheelchair Mobility    Modified Rankin (Stroke Patients Only)       Balance Overall balance assessment: Needs assistance           Standing balance-Leahy Scale: Fair Standing balance comment: needs minguard for dynamic activities without UE support             High level balance activites: Side stepping;Other (comment) High Level Balance Comments: side stepping, marching with one versus no UE support and minguard near sink in room      Cognition Arousal/Alertness: Awake/alert Behavior During Therapy: WFL for tasks assessed/performed Overall Cognitive Status: Within Functional Limits for tasks assessed                      Exercises General Exercises - Lower Extremity Hip ABduction/ADduction: 10 reps;Both;Standing;Strengthening Hip Flexion/Marching: Both;5 reps;Standing;Strengthening Heel Raises: Both;10 reps;Standing;Strengthening Mini-Sqauts: Both;10 reps;Standing    General Comments General comments (skin integrity, edema, etc.): maintained on 1L O2 during ambulation with DOE 2/4 with seated rest between activities during session      Pertinent Vitals/Pain Pain Assessment: No/denies pain    Home Living                      Prior Function            PT Goals (current goals can now be found in the care plan section) Progress towards PT goals: Progressing toward goals    Frequency  Min 3X/week    PT Plan Current plan remains appropriate    Co-evaluation             End of Session Equipment Utilized During Treatment: Gait belt;Oxygen Activity Tolerance: Patient limited by fatigue Patient left: in chair;with call bell/phone within reach     Time: 1338-1405 PT Time Calculation (min) (ACUTE ONLY): 27 min  Charges:  $Gait Training: 8-22 mins $Therapeutic Exercise: 8-22 mins                    G Codes:  Elray McgregorCynthia Wynn 07/31/2016, 3:19 PM Sheran Lawlessyndi Wynn, [place]PT [phone] 07/31/2016

## 2016-08-01 MED ORDER — OXYCODONE-ACETAMINOPHEN 5-325 MG PO TABS: 1.0000 | ORAL_TABLET | ORAL | 0 refills | Status: DC | PRN

## 2016-08-01 MED ORDER — OXYCODONE-ACETAMINOPHEN 5-325 MG PO TABS: 1.0000 | ORAL_TABLET | Freq: Four times a day (QID) | ORAL | 0 refills | Status: DC | PRN

## 2016-08-01 NOTE — Progress Notes (Signed)
Occupational Therapy Treatment Patient Details Name: Nicole GivensJanice D Hickling MRN: [id number] DOB: 1940-12-02 Today's Date: 08/01/2016    History of present illness 75 yo admitted for AAA repair. PMhx: anxiety, arthritis, CAD, GERD, HTN, HLD   OT comments  Pt limited by pain this session, but able to tolerate practicing tub transfer and LB ADLs. Pt requires min guard-supervision assist for basic transfers depending on height of seat. Educated pt on fall prevention and home safety strategies to minimize her fall risk at home. Will continue to follow acutely.   Follow Up Recommendations  Home health OT;Supervision/Assistance - 24 hour    Equipment Recommendations  3 in 1 bedside comode    Recommendations for Other Services      Precautions / Restrictions Precautions Precautions: Fall Restrictions Weight Bearing Restrictions: No       Mobility Bed Mobility               General bed mobility comments: Pt up in chair on OT arrival  Transfers Overall transfer level: Needs assistance Equipment used: Rolling walker (2 wheeled) Transfers: Sit to/from UGI CorporationStand;Stand Pivot Transfers Sit to Stand: Supervision Stand pivot transfers: Supervision       General transfer comment: Supervision for safety due to increased pain.     Balance Overall balance assessment: Needs assistance Sitting-balance support: No upper extremity supported;Feet supported Sitting balance-Leahy Scale: Good     Standing balance support: No upper extremity supported;During functional activity Standing balance-Leahy Scale: Fair                     ADL Overall ADL's : Needs assistance/impaired     Grooming: Wash/dry hands;Wash/dry face;Sitting;Set up   Upper Body Bathing: Supervision/ safety;Sitting   Lower Body Bathing: Supervison/ safety;Sit to/from stand           Toilet Transfer: Supervision/safety;BSC;RW;Stand-pivot   Toileting- ArchitectClothing Manipulation and Hygiene:  Supervision/safety;Sitting/lateral lean   Tub/ Shower Transfer: Tub transfer;Min guard;Cueing for safety;Ambulation;Rolling walker   Functional mobility during ADLs: Min guard;Rolling walker        Vision                     Perception     Praxis      Cognition   Behavior During Therapy: WFL for tasks assessed/performed Overall Cognitive Status: Within Functional Limits for tasks assessed                       Extremity/Trunk Assessment               Exercises     Shoulder Instructions       General Comments      Pertinent Vitals/ Pain       Pain Assessment: 0-10 Pain Score: 7  Pain Location: incision site Pain Descriptors / Indicators: Sore;Tender Pain Intervention(s): Limited activity within patient's tolerance;Monitored during session;Repositioned;Patient requesting pain meds-RN notified  Home Living                                          Prior Functioning/Environment              Frequency Min 2X/week     Progress Toward Goals  OT Goals(current goals can now be found in the care plan section)  Progress towards OT goals: Progressing toward goals  Acute Rehab OT Goals Patient Stated Goal: return home  to baking OT Goal Formulation: With patient Time For Goal Achievement: 08/06/16 Potential to Achieve Goals: Good ADL Goals Pt Will Perform Lower Body Bathing: with supervision;sit to/from stand;with adaptive equipment;with set-up Pt Will Perform Lower Body Dressing: with set-up;with supervision;sit to/from stand;with adaptive equipment Pt Will Transfer to Toilet: with supervision;ambulating;bedside commode Pt Will Perform Toileting - Clothing Manipulation and hygiene: with modified independence;sit to/from stand;sitting/lateral leans Additional ADL Goal #1: Pt will verbalize 3 energy conservation techniques for ADL  Plan Discharge plan needs to be updated    Co-evaluation                 End of  Session Equipment Utilized During Treatment: Gait belt;Rolling walker   Activity Tolerance Patient limited by pain   Patient Left in chair;with call bell/phone within reach   Nurse Communication Mobility status;Patient requests pain meds        Time: 1225-1238 OT Time Calculation (min): 13 min  Charges: OT General Charges $OT Visit: 1 Procedure OT Treatments $Self Care/Home Management : 8-22 mins  Nils Pyle, OTR/L Pager: [phone] 08/01/2016, 3:04 PM

## 2016-08-01 NOTE — Progress Notes (Signed)
Insurance, [employer], has [facility]. I contacted pt and her daughter, [name], and they are aware. They wish to d/c home. RN CM made aware. [phone]

## 2016-08-01 NOTE — Progress Notes (Signed)
[facility] [place]  Subjective  - Doing well oever all.  Tolerating PO's without nausea.  No BM yet.   Objective (!) 128/55 77 99.1 F (37.3 C) (Oral) 18 94%  Intake/Output Summary (Last 24 hours) at 08/01/16 0748 Last data filed at 08/01/16 0300  Gross per 24 hour  Intake             1020 ml  Output                0 ml  Net             1020 ml    Abdomin soft, incision healing well Palpable DP bilaterally Heart RRR Lungs non labored breathing  Assessment/Planning: 75 y.o. female is s/p: open repair juxtarenal AAA 10 Days Post-Op   WBC 14.9 slight increase from 14.7.  Bactrim started 2 days ago for UTI Pending CIR verses SNF  Clinton GallantCOLLINS, Dave Mergen [facility] Hospital[facility]EN 08/01/2016 7:48 AM --  Laboratory Lab Results:  Recent Labs  07/30/16 0236 07/31/16 0154  WBC 14.7* 14.9*  HGB 9.1* 9.3*  HCT 28.6* 29.4*  PLT 375 383   BMET  Recent Labs  07/30/16 0236 07/31/16 0154  NA 136 133*  K 3.7 3.3*  CL 100* 99*  CO2 27 28  GLUCOSE 89 92  BUN 14 11  CREATININE 0.46 0.45  CALCIUM 8.0* 7.8*    COAG Lab Results  Component Value Date   INR 1.34 07/22/2016   INR 1.03 07/16/2016   INR 1.52 (H) 04/04/2016   No results found for: PTT

## 2016-08-01 NOTE — Care Management Note (Addendum)
Case Management Note Donn PieriniKristi Kerisha Goughnour RN, BSN Unit 2W-Case Manager [phone]  Patient Details  Name: Nicole GivensJanice D Finken MRN: [id number] Date of Birth: 12/25/1940  Subjective/Objective:     Pt s/p AAA repair on 8/22- tx from ICU to 2W on 07/23/16 - NGT out- pt remains NPO-on 8/24              Action/Plan: PTA pt lived at home alone- has daughter and son nearby- [facility] CM to follow for d/c needs- will need HHPT order for discharge.   Expected Discharge Date:      08/01/16            Expected Discharge Plan:  Home w Home Health Services  In-House Referral:     Discharge planning Services  CM Consult  Post Acute Care Choice:  Durable Medical Equipment, Home Health Choice offered to:  Patient  DME Arranged:  3-N-1 DME Agency:  [facility]  HH Arranged:  [facility], [facility]  [facility]  Status of Service:  Completed, signed off  If discussed at Long Length of Stay Meetings, dates discussed:    Additional Comments:  08/01/16- 1200- Braeleigh Pyper RN, CM- CIR has heard from [employer] and pt has been denied a INPT rehab stay from insurance- spoke with pt and daughter who both state that [facility] and not [facility] as pt has progressed well. Family will be able to be with pt over weekend. Have notified M. Collins PA regarding CIR denial-- and pt desire to return home- [facility]- orders have been placed for HHRN/PT/OT- per pt [facility] and will need [facility]- [facility] services but not RN due to staffing reasons- pt and daughter aware and are ok with this- also [facility]- DME to be delivered to room prior to discharge.  Received a return call from Tiffany with [facility] at 1400- they can now provide both RN and PT services.   07/28/16- Donn PieriniKristi Abhishek Levesque RN, CM- CIR  consult placed, CSW also will see pt as back up plan- CM will follow incase insurance does not approve CIR   07/25/16- 1500- Anahi Belmar RN, CM- spoke with pt and daughter [name] at bedside- per conversation pt would like to return home however per daughter will not have 24/7 supervision- and may need to look [facility]- pt has used [facility] in the past and [facility]- if needs rehab would prefer CIR first choice if insurance would approve- will have CIR screen pt to see if pt may be appropriate before requesting official consult. Pt also states she would like a 3n1 for home and may need home 02- CM to follow up as pt progresses.   Darrold SpanWebster, Dmetrius Ambs Hall, RN 08/01/2016, 12:05 PM

## 2016-08-01 NOTE — Care Management Important Message (Signed)
Important Message  Patient Details  Name: Nicole GivensJanice D Punches MRN: [id number] Date of Birth: Nov 12, 1941   Medicare Important Message Given:  Yes    Ferlando Lia Abena 08/01/2016, 10:01 AM

## 2016-08-07 ENCOUNTER — Telehealth: Payer: Self-pay

## 2016-08-07 NOTE — Telephone Encounter (Signed)
Phone call received from [facility] RN.  Reported the pt. Has 2 + LE edema in feet and toes.  Stated the pt. Is on HCTZ, and has been elevating her legs.  Reported weight decrease from 142.8 lbs.-135.8 lbs., recently, lungs clear, O2 Sats good.  Asking about need for  compression stockings for the edema.  Also, reported the Ferrous Sulfate OTC upsets her stomach, and questioned if a prescription for Ferrous Sulfate would be less hard on her stomach.  Discussed the above questions with Dr. Darrick PennaFields; noted that pre-op ABI's in 03/2016 showed 0.66 (R) LE, and 0.76 (L) LE.  Per Dr. Darrick PennaFields, it is okay to have pt. get fitted for 20-30 compression stockings.  Also, he stated he does not feel that a prescription of Ferrous Sulfate will be any easier on her stomach, and that constipation will be the biggest side effect with the iron.  Notified Amy, HH RN of recommendation for the compression stockings.  Stated she will measure the pt. for stockings and assist her in getting them.

## 2016-08-08 ENCOUNTER — Telehealth: Payer: Self-pay | Admitting: Vascular Surgery

## 2016-08-08 ENCOUNTER — Telehealth: Payer: Self-pay

## 2016-08-08 MED ORDER — OXYCODONE-ACETAMINOPHEN 5-325 MG PO TABS: ORAL_TABLET | ORAL | 0 refills | Status: DC

## 2016-08-08 NOTE — Telephone Encounter (Signed)
-----   Message from Phillips Odorarol S Pullins, RN sent at 08/07/2016 10:46 AM EDT ----- Regarding: needs 2 week post op f/u This pt. Had Open repair AAA with Aortobifemoral BP on 8/22; she was discharged on 9/1; per the discharge note, she will need to have a 2 week f/u with Dr. Edilia Boickson.

## 2016-08-08 NOTE — Telephone Encounter (Signed)
Phone call from pt's daughter, Nicole Dean.  Reported the pt. Is almost out of pain medication, and is requesting a refill today.  Phone call to pt.  Stated she is taking about 3-4 pills/ day, and only taking when she absolutely needs one.  Stated her pain is mostly in the lower abdominal incision.  Stated that she is up and walking, eating ealthy, slowly getting her appetite back.  Reported her incisions look good. Discussed with Dr. Randie Heinzain.  Gave approval to refill the Oxycodone/ Acetaminophen 5/325 mg; # 30; no refills.  Notified pt's daughter via voice message re: need to pick up a prescription from the office, and take to the pharmacy.

## 2016-08-08 NOTE — Telephone Encounter (Signed)
informed pt of appt on 08/13/16 @ 9:30, 08/08/16 beg

## 2016-08-12 ENCOUNTER — Encounter: Payer: Self-pay | Admitting: Vascular Surgery

## 2016-08-13 ENCOUNTER — Encounter: Payer: Self-pay | Admitting: Vascular Surgery

## 2016-08-13 ENCOUNTER — Ambulatory Visit (INDEPENDENT_AMBULATORY_CARE_PROVIDER_SITE_OTHER): Payer: Medicare HMO | Admitting: Vascular Surgery

## 2016-08-13 VITALS — BP 116/69 | HR 86 | Temp 97.8°F | Resp 14 | Ht 62.0 in | Wt 132.0 lb

## 2016-08-13 DIAGNOSIS — I714 Abdominal aortic aneurysm, without rupture, unspecified: Secondary | ICD-10-CM

## 2016-08-13 NOTE — Progress Notes (Signed)
Patient name: Nicole Dean MRN: [id number] DOB: December 02, 1940 Sex: female  REASON FOR VISIT: Follow up after open repair of juxtarenal abdominal aortic aneurysm.  HPI: Nicole Dean is a 75 y.o. female Who presented with a 6 cm juxtarenal aneurysm. She was not a candidate for endovascular repair. She underwent CABG preoperatively given that she had evidence of significant coronary artery disease. 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 However, she did well with surgery. She required open repair of her juxtarenal aneurysm with an aortobifemoral bypass graft. She comes in for a routine follow up visit.  The patient has no specific complaints. She has been gradually resuming her normal activities. Her right leg lower extremity symptoms have improved significantly. Her appetite is improving and her bowels are working normally.  Current Outpatient Prescriptions  Medication Sig Dispense Refill  . aspirin EC 325 MG EC tablet Take 1 tablet (325 mg total) by mouth daily. 30 tablet 0  . atorvastatin (LIPITOR) 40 MG tablet Take 1 tablet (40 mg total) by mouth daily. (Patient taking differently: Take 40 mg by mouth daily at 6 PM. ) 30 tablet 11  . carvedilol (COREG) 6.25 MG tablet Take 1 tablet (6.25 mg total) by mouth 2 (two) times daily. 60 tablet 11  . ferrous fumarate-b12-vitamic C-folic acid (TRINSICON / FOLTRIN) capsule Take 1 capsule by mouth every morning. 30 capsule 1  . Ferrous Sulfate 28 MG TABS Take 28 mg by mouth daily.    . hydrochlorothiazide (HYDRODIURIL) 25 MG tablet Take 25 mg by mouth daily.  1  . Multiple Vitamins-Iron (MULTIVITAMIN/IRON PO) Take 1 tablet by mouth daily.    Marland Kitchen omeprazole (PRILOSEC) 40 MG capsule Take 40 mg by mouth at bedtime.     Marland Kitchen oxyCODONE-acetaminophen (PERCOCET/ROXICET) 5-325 MG tablet Take 1-2 tablets q 6 hrs/ prn/ pain 30 tablet 0  . potassium chloride (K-DUR) 10 MEQ tablet Take 1 tablet (10 mEq total) by  mouth daily. 30 tablet 11  . sertraline (ZOLOFT) 100 MG tablet Take 100 mg by mouth daily with breakfast.     . traMADol (ULTRAM) 50 MG tablet Take 1-2 tablets (50-100 mg total) by mouth every 4 (four) hours as needed for moderate pain. 30 tablet 0  . traZODone (DESYREL) 100 MG tablet Take 100 mg by mouth at bedtime.    . busPIRone (BUSPAR) 30 MG tablet Take 30 mg by mouth 2 (two) times daily. Reported on 04/01/2016     No current facility-administered medications for this visit.     REVIEW OF SYSTEMS:  [X]  denotes positive finding, [ ]  denotes negative finding Cardiac  Comments:  Chest pain or chest pressure:    Shortness of breath upon exertion:    Short of breath when lying flat:    Irregular heart rhythm:    Constitutional    Fever or chills:      PHYSICAL EXAM: Vitals:   08/13/16 0935  BP: 116/69  Pulse: 86  Resp: 14  Temp: 97.8 F (36.6 C)  SpO2: 96%  Weight: 132 lb (59.9 kg)  Height: 5\' 2"  (1.575 m)    GENERAL: The patient is a well-nourished female, in no acute distress. The vital signs are documented above. CARDIOVASCULAR: There is a regular rate and rhythm. PULMONARY: There is good air exchange bilaterally without wheezing or rales. I do not detect carotid bruits. She has palpable femoral pulses. Her incisions are healing nicely.  MEDICAL ISSUES:  STATUS POST REPAIR OF  JUXTARENAL ABDOMINAL AORTIC ANEURYSM: The patient is doing well status post open repair of her juxtarenal aneurysm. She has no specific complaints. I'll see her back in 2 months with ABIs. She did have diffuse multilevel arterial occlusive disease in her symptoms in the right leg have improved since her aortofemoral bypass graft. We'll need to follow her peripheral vascular disease. We have instructed her on the importance of receiving prophylactic antibiotics for any invasive procedures. She is on aspirin and is on a statin.  Waverly Ferrariickson, [place] [facility] [place]  [phone]

## 2016-08-22 ENCOUNTER — Telehealth: Payer: Self-pay

## 2016-08-22 NOTE — Telephone Encounter (Signed)
rec'd phone call from Diane, OT from [facility].  Reported that she discharged the pt. 2 days early from OT services, due to pt. meeting all her goals, and being independent in her ADL's.

## 2016-09-15 ENCOUNTER — Telehealth: Payer: Self-pay

## 2016-09-15 DIAGNOSIS — R1031 Right lower quadrant pain: Secondary | ICD-10-CM

## 2016-09-15 DIAGNOSIS — Z95828 Presence of other vascular implants and grafts: Secondary | ICD-10-CM

## 2016-09-15 NOTE — Telephone Encounter (Signed)
Discussed pt's symptoms with Dr. Myra GianottiBrabham.  advised to schedule ultrasound right groin to r/o pseudoaneurysm.  Will contact pt. With appt.

## 2016-09-15 NOTE — Telephone Encounter (Signed)
Pt. Called and reported increased pain in the right groin that started on Friday PM; stated pain is intermittent.  Described the area as tender.  Denied any increased redness, warmth, or swelling.  Denied fever or chills.  Advised will try to work her in sooner than the November 22nd appt.

## 2016-09-16 ENCOUNTER — Ambulatory Visit (INDEPENDENT_AMBULATORY_CARE_PROVIDER_SITE_OTHER): Payer: Medicare HMO | Admitting: Family

## 2016-09-16 ENCOUNTER — Ambulatory Visit (HOSPITAL_COMMUNITY)
Admission: RE | Admit: 2016-09-16 | Discharge: 2016-09-16 | Disposition: A | Discharge status: Home / Self Care | Payer: Medicare HMO | Source: Ambulatory Visit | Attending: Surgery | Admitting: Surgery

## 2016-09-16 ENCOUNTER — Encounter: Payer: Self-pay | Admitting: Family

## 2016-09-16 VITALS — BP 116/66 | HR 80 | Temp 98.9°F | Resp 16 | Ht 62.0 in | Wt 122.0 lb

## 2016-09-16 DIAGNOSIS — I714 Abdominal aortic aneurysm, without rupture, unspecified: Secondary | ICD-10-CM

## 2016-09-16 DIAGNOSIS — I70201 Unspecified atherosclerosis of native arteries of extremities, right leg: Secondary | ICD-10-CM | POA: Insufficient documentation

## 2016-09-16 DIAGNOSIS — Z95828 Presence of other vascular implants and grafts: Secondary | ICD-10-CM

## 2016-09-16 DIAGNOSIS — I350 Nonrheumatic aortic (valve) stenosis: Secondary | ICD-10-CM

## 2016-09-16 DIAGNOSIS — R1031 Right lower quadrant pain: Secondary | ICD-10-CM

## 2016-09-16 NOTE — Progress Notes (Signed)
    Post-operative Open AAA Repair  History of Present Illness  Nicole Dean is a 75 y.o. female patient of Dr. Edilia Boickson who presented with a 6 cm juxtarenal aneurysm. She was not a candidate for endovascular repair. She underwent CABG preoperatively given that she had evidence of significant coronary artery disease. 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 However, she did well with surgery. She required open repair of her juxtarenal aneurysm with an aortobifemoral bypass graft on 07/22/16.   She returns today with reported increased pain in the right groin that started on 09/12/16; stated pain is intermittent.  Described the area as tender.  Denied any increased redness, warmth, or swelling.  Denied fever or chills. She has a follow up appointment with Dr. Edilia Boickson scheduled on November 22nd. An arterial duplex of her right groin is performed today to rule out pseudoaneurysm.   She is sometimes nauseated after she eats, has lost a few pounds. She denies constipation, is moving her bowels well.   For VQI Use Only  PRE-ADM LIVING: Home  AMB STATUS: Ambulatory  Physical Examination  Vitals:   09/16/16 0909  BP: 116/66  Pulse: 80  Resp: 16  Temp: 98.9 F (37.2 C)  TempSrc: Oral  SpO2: 98%  Weight: 122 lb (55.3 kg)  Height: 5\' 2"  (1.575 m)   Body mass index is 22.31 kg/m.  Gastrointestinal: soft, moderately tender to palpation at right LQ, -G/R, - HSM, - masses, - CVAT B. Bilateral femoral pulses are palpable. Bilateral pedal pulses are palpable. Both groin and midline abdominal incisions are well healed, no erythema, no drainage, no fluctuance.  Medical Decision Making  Nicole Dean is a 75 y.o. female who presents s/p  open repair of her juxtarenal aneurysm with an aortobifemoral bypass graft on 07/22/16.  Dr. Arbie CookeyEarly spoke with and examined pt and thinks that this pain will self resolve  DATA Right common femoral  artery duplex demonstrates no evidence of pseudoaneurysm.   Follow up with Dr. Edilia Boickson as scheduled on 10/22/16.   Thank you for allowing us to participate in this patient's care.  NICKEL, Carma LairSUZANNE L, RN, MSN, FNP-C [facility] Office: [phone]  09/16/2016, 9:37 AM  Clinic MD: Early

## 2016-09-16 NOTE — Patient Instructions (Signed)
Peripheral Vascular Disease Peripheral vascular disease (PVD) is a disease of the blood vessels that are not part of your heart and brain. A simple term for PVD is poor circulation. In most cases, PVD narrows the blood vessels that carry blood from your heart to the rest of your body. This can result in a decreased supply of blood to your arms, legs, and internal organs, like your stomach or kidneys. However, it most often affects a person's lower legs and feet. There are two types of PVD.  Organic PVD. This is the more common type. It is caused by damage to the structure of blood vessels.  Functional PVD. This is caused by conditions that make blood vessels contract and tighten (spasm). Without treatment, PVD tends to get worse over time. PVD can also lead to acute ischemic limb. This is when an arm or limb suddenly has trouble getting enough blood. This is a medical emergency. CAUSES Each type of PVD has many different causes. The most common cause of PVD is buildup of a fatty material (plaque) inside of your arteries (atherosclerosis). Small amounts of plaque can break off from the walls of the blood vessels and become lodged in a smaller artery. This blocks blood flow and can cause acute ischemic limb. Other common causes of PVD include:  Blood clots that form inside of blood vessels.  Injuries to blood vessels.  Diseases that cause inflammation of blood vessels or cause blood vessel spasms.  Health behaviors and health history that increase your risk of developing PVD. RISK FACTORS  You may have a greater risk of PVD if you:  Have a family history of PVD.  Have certain medical conditions, including:  High cholesterol.  Diabetes.  High blood pressure (hypertension).  Coronary heart disease.  Past problems with blood clots.  Past injury, such as burns or a broken bone. These may have damaged blood vessels in your limbs.  Buerger disease. This is caused by inflamed blood  vessels in your hands and feet.  Some forms of arthritis.  Rare birth defects that affect the arteries in your legs.  Use tobacco.  Do not get enough exercise.  Are obese.  Are age 50 or older. SIGNS AND SYMPTOMS  PVD may cause many different symptoms. Your symptoms depend on what part of your body is not getting enough blood. Some common signs and symptoms include:  Cramps in your lower legs. This may be a symptom of poor leg circulation (claudication).  Pain and weakness in your legs while you are physically active that goes away when you rest (intermittent claudication).  Leg pain when at rest.  Leg numbness, tingling, or weakness.  Coldness in a leg or foot, especially when compared with the other leg.  Skin or hair changes. These can include:  Hair loss.  Shiny skin.  Pale or bluish skin.  Thick toenails.  Inability to get or maintain an erection (erectile dysfunction). People with PVD are more prone to developing ulcers and sores on their toes, feet, or legs. These may take longer than normal to heal. DIAGNOSIS Your health care provider may diagnose PVD from your signs and symptoms. The health care provider will also do a physical exam. You may have tests to find out what is causing your PVD and determine its severity. Tests may include:  Blood pressure recordings from your arms and legs and measurements of the strength of your pulses (pulse volume recordings).  Imaging studies using sound waves to take pictures of   the blood flow through your blood vessels (Doppler ultrasound).  Injecting a dye into your blood vessels before having imaging studies using:  X-rays (angiogram or arteriogram).  Computer-generated X-rays (CT angiogram).  A powerful electromagnetic field and a computer (magnetic resonance angiogram or MRA). TREATMENT Treatment for PVD depends on the cause of your condition and the severity of your symptoms. It also depends on your age. Underlying  causes need to be treated and controlled. These include long-lasting (chronic) conditions, such as diabetes, high cholesterol, and high blood pressure. You may need to first try making lifestyle changes and taking medicines. Surgery may be needed if these do not work. Lifestyle changes may include:  Quitting smoking.  Exercising regularly.  Following a low-fat, low-cholesterol diet. Medicines may include:  Blood thinners to prevent blood clots.  Medicines to improve blood flow.  Medicines to improve your blood cholesterol levels. Surgical procedures may include:  A procedure that uses an inflated balloon to open a blocked artery and improve blood flow (angioplasty).  A procedure to put in a tube (stent) to keep a blocked artery open (stent implant).  Surgery to reroute blood flow around a blocked artery (peripheral bypass surgery).  Surgery to remove dead tissue from an infected wound on the affected limb.  Amputation. This is surgical removal of the affected limb. This may be necessary in cases of acute ischemic limb that are not improved through medical or surgical treatments. HOME CARE INSTRUCTIONS  Take medicines only as directed by your health care provider.  Do not use any tobacco products, including cigarettes, chewing tobacco, or electronic cigarettes. If you need help quitting, ask your health care provider.  Lose weight if you are overweight, and maintain a healthy weight as directed by your health care provider.  Eat a diet that is low in fat and cholesterol. If you need help, ask your health care provider.  Exercise regularly. Ask your health care provider to suggest some good activities for you.  Use compression stockings or other mechanical devices as directed by your health care provider.  Take good care of your feet.  Wear comfortable shoes that fit well.  Check your feet often for any cuts or sores. SEEK MEDICAL CARE IF:  You have cramps in your legs  while walking.  You have leg pain when you are at rest.  You have coldness in a leg or foot.  Your skin changes.  You have erectile dysfunction.  You have cuts or sores on your feet that are not healing. SEEK IMMEDIATE MEDICAL CARE IF:  Your arm or leg turns cold and blue.  Your arms or legs become red, warm, swollen, painful, or numb.  You have chest pain or trouble breathing.  You suddenly have weakness in your face, arm, or leg.  You become very confused or lose the ability to speak.  You suddenly have a very bad headache or lose your vision.   This information is not intended to replace advice given to you by your health care provider. Make sure you discuss any questions you have with your health care provider.   Document Released: 12/25/2004 Document Revised: 12/08/2014 Document Reviewed: 04/27/2014 [employer] Interactive Patient Education 2016 [employer]  

## 2016-10-01 DIAGNOSIS — K819 Cholecystitis, unspecified: Secondary | ICD-10-CM

## 2016-10-01 HISTORY — DX: Cholecystitis, unspecified: K81.9

## 2016-10-13 ENCOUNTER — Inpatient Hospital Stay (HOSPITAL_COMMUNITY)
Admission: EM | Admit: 2016-10-13 | Discharge: 2016-10-23 | DRG: 414 | Disposition: A | Discharge status: Home / Self Care | Payer: Medicare HMO | Attending: Internal Medicine | Admitting: Internal Medicine

## 2016-10-13 ENCOUNTER — Telehealth: Payer: Self-pay | Admitting: *Deleted

## 2016-10-13 ENCOUNTER — Emergency Department (HOSPITAL_COMMUNITY): Payer: Medicare HMO

## 2016-10-13 ENCOUNTER — Encounter (HOSPITAL_COMMUNITY): Payer: Self-pay | Admitting: Emergency Medicine

## 2016-10-13 DIAGNOSIS — K660 Peritoneal adhesions (postprocedural) (postinfection): Secondary | ICD-10-CM | POA: Diagnosis present

## 2016-10-13 DIAGNOSIS — F419 Anxiety disorder, unspecified: Secondary | ICD-10-CM | POA: Diagnosis present

## 2016-10-13 DIAGNOSIS — F329 Major depressive disorder, single episode, unspecified: Secondary | ICD-10-CM | POA: Diagnosis present

## 2016-10-13 DIAGNOSIS — Z1611 Resistance to penicillins: Secondary | ICD-10-CM | POA: Diagnosis present

## 2016-10-13 DIAGNOSIS — R000 Tachycardia, unspecified: Secondary | ICD-10-CM | POA: Diagnosis not present

## 2016-10-13 DIAGNOSIS — I5032 Chronic diastolic (congestive) heart failure: Secondary | ICD-10-CM | POA: Diagnosis present

## 2016-10-13 DIAGNOSIS — Z95828 Presence of other vascular implants and grafts: Secondary | ICD-10-CM

## 2016-10-13 DIAGNOSIS — B964 Proteus (mirabilis) (morganii) as the cause of diseases classified elsewhere: Secondary | ICD-10-CM | POA: Diagnosis present

## 2016-10-13 DIAGNOSIS — F5104 Psychophysiologic insomnia: Secondary | ICD-10-CM | POA: Diagnosis present

## 2016-10-13 DIAGNOSIS — K8000 Calculus of gallbladder with acute cholecystitis without obstruction: Secondary | ICD-10-CM | POA: Diagnosis not present

## 2016-10-13 DIAGNOSIS — Z9842 Cataract extraction status, left eye: Secondary | ICD-10-CM

## 2016-10-13 DIAGNOSIS — I110 Hypertensive heart disease with heart failure: Secondary | ICD-10-CM | POA: Diagnosis present

## 2016-10-13 DIAGNOSIS — Z1623 Resistance to quinolones and fluoroquinolones: Secondary | ICD-10-CM | POA: Diagnosis present

## 2016-10-13 DIAGNOSIS — K810 Acute cholecystitis: Secondary | ICD-10-CM

## 2016-10-13 DIAGNOSIS — T40605A Adverse effect of unspecified narcotics, initial encounter: Secondary | ICD-10-CM | POA: Diagnosis not present

## 2016-10-13 DIAGNOSIS — Z961 Presence of intraocular lens: Secondary | ICD-10-CM | POA: Diagnosis present

## 2016-10-13 DIAGNOSIS — Z9889 Other specified postprocedural states: Secondary | ICD-10-CM

## 2016-10-13 DIAGNOSIS — K567 Ileus, unspecified: Secondary | ICD-10-CM | POA: Diagnosis not present

## 2016-10-13 DIAGNOSIS — Z951 Presence of aortocoronary bypass graft: Secondary | ICD-10-CM

## 2016-10-13 DIAGNOSIS — Z880 Allergy status to penicillin: Secondary | ICD-10-CM

## 2016-10-13 DIAGNOSIS — I951 Orthostatic hypotension: Secondary | ICD-10-CM | POA: Diagnosis not present

## 2016-10-13 DIAGNOSIS — Z87891 Personal history of nicotine dependence: Secondary | ICD-10-CM

## 2016-10-13 DIAGNOSIS — I10 Essential (primary) hypertension: Secondary | ICD-10-CM | POA: Diagnosis present

## 2016-10-13 DIAGNOSIS — D62 Acute posthemorrhagic anemia: Secondary | ICD-10-CM | POA: Diagnosis not present

## 2016-10-13 DIAGNOSIS — I2510 Atherosclerotic heart disease of native coronary artery without angina pectoris: Secondary | ICD-10-CM | POA: Diagnosis present

## 2016-10-13 DIAGNOSIS — R1084 Generalized abdominal pain: Secondary | ICD-10-CM

## 2016-10-13 DIAGNOSIS — E785 Hyperlipidemia, unspecified: Secondary | ICD-10-CM | POA: Diagnosis present

## 2016-10-13 DIAGNOSIS — E43 Unspecified severe protein-calorie malnutrition: Secondary | ICD-10-CM | POA: Insufficient documentation

## 2016-10-13 DIAGNOSIS — R5082 Postprocedural fever: Secondary | ICD-10-CM | POA: Diagnosis not present

## 2016-10-13 DIAGNOSIS — Z9841 Cataract extraction status, right eye: Secondary | ICD-10-CM

## 2016-10-13 DIAGNOSIS — Z8679 Personal history of other diseases of the circulatory system: Secondary | ICD-10-CM

## 2016-10-13 DIAGNOSIS — K219 Gastro-esophageal reflux disease without esophagitis: Secondary | ICD-10-CM | POA: Diagnosis present

## 2016-10-13 DIAGNOSIS — R2990 Unspecified symptoms and signs involving the nervous system: Secondary | ICD-10-CM

## 2016-10-13 DIAGNOSIS — Z6822 Body mass index (BMI) 22.0-22.9, adult: Secondary | ICD-10-CM

## 2016-10-13 DIAGNOSIS — R29810 Facial weakness: Secondary | ICD-10-CM

## 2016-10-13 DIAGNOSIS — D638 Anemia in other chronic diseases classified elsewhere: Secondary | ICD-10-CM | POA: Diagnosis present

## 2016-10-13 DIAGNOSIS — R4781 Slurred speech: Secondary | ICD-10-CM | POA: Diagnosis not present

## 2016-10-13 DIAGNOSIS — Z8673 Personal history of transient ischemic attack (TIA), and cerebral infarction without residual deficits: Secondary | ICD-10-CM

## 2016-10-13 DIAGNOSIS — E876 Hypokalemia: Secondary | ICD-10-CM | POA: Diagnosis not present

## 2016-10-13 DIAGNOSIS — Z885 Allergy status to narcotic agent status: Secondary | ICD-10-CM

## 2016-10-13 DIAGNOSIS — B9620 Unspecified Escherichia coli [E. coli] as the cause of diseases classified elsewhere: Secondary | ICD-10-CM | POA: Diagnosis present

## 2016-10-13 DIAGNOSIS — R9089 Other abnormal findings on diagnostic imaging of central nervous system: Secondary | ICD-10-CM

## 2016-10-13 DIAGNOSIS — Z1629 Resistance to other single specified antibiotic: Secondary | ICD-10-CM | POA: Diagnosis present

## 2016-10-13 DIAGNOSIS — Y92239 Unspecified place in hospital as the place of occurrence of the external cause: Secondary | ICD-10-CM | POA: Diagnosis not present

## 2016-10-13 DIAGNOSIS — Z7982 Long term (current) use of aspirin: Secondary | ICD-10-CM

## 2016-10-13 DIAGNOSIS — Z79899 Other long term (current) drug therapy: Secondary | ICD-10-CM

## 2016-10-13 HISTORY — DX: Cholecystitis, unspecified: K81.9

## 2016-10-13 LAB — COMPREHENSIVE METABOLIC PANEL
ALK PHOS: 100 U/L (ref 38–126)
ALT: 10 U/L — ABNORMAL LOW (ref 14–54)
ANION GAP: 13 (ref 5–15)
AST: 25 U/L (ref 15–41)
Albumin: 3.5 g/dL (ref 3.5–5.0)
BILIRUBIN TOTAL: 0.3 mg/dL (ref 0.3–1.2)
BUN: 27 mg/dL — ABNORMAL HIGH (ref 6–20)
CALCIUM: 9.5 mg/dL (ref 8.9–10.3)
CO2: 24 mmol/L (ref 22–32)
Chloride: 97 mmol/L — ABNORMAL LOW (ref 101–111)
Creatinine, Ser: 1.14 mg/dL — ABNORMAL HIGH (ref 0.44–1.00)
GFR calc non Af Amer: 46 mL/min — ABNORMAL LOW (ref >60)
GFR, EST AFRICAN AMERICAN: 54 mL/min — AB (ref >60)
Glucose, Bld: 105 mg/dL — ABNORMAL HIGH (ref 65–99)
POTASSIUM: 4.5 mmol/L (ref 3.5–5.1)
Sodium: 134 mmol/L — ABNORMAL LOW (ref 135–145)
TOTAL PROTEIN: 6.8 g/dL (ref 6.5–8.1)

## 2016-10-13 LAB — CBC
HEMATOCRIT: 37.1 % (ref 36.0–46.0)
HEMOGLOBIN: 12 g/dL (ref 12.0–15.0)
MCH: 27.5 pg (ref 26.0–34.0)
MCHC: 32.3 g/dL (ref 30.0–36.0)
MCV: 84.9 fL (ref 78.0–100.0)
Platelets: 428 10*3/uL — ABNORMAL HIGH (ref 150–400)
RBC: 4.37 MIL/uL (ref 3.87–5.11)
RDW: 17 % — ABNORMAL HIGH (ref 11.5–15.5)
WBC: 15.7 10*3/uL — AB (ref 4.0–10.5)

## 2016-10-13 LAB — URINE MICROSCOPIC-ADD ON
Bacteria, UA: NONE SEEN
WBC UA: NONE SEEN WBC/hpf (ref 0–5)

## 2016-10-13 LAB — URINALYSIS, ROUTINE W REFLEX MICROSCOPIC
Glucose, UA: NEGATIVE mg/dL
Hgb urine dipstick: NEGATIVE
Ketones, ur: NEGATIVE mg/dL
NITRITE: NEGATIVE
Protein, ur: NEGATIVE mg/dL
SPECIFIC GRAVITY, URINE: 1.025 (ref 1.005–1.030)
pH: 5 (ref 5.0–8.0)

## 2016-10-13 LAB — LIPASE, BLOOD: Lipase: 41 U/L (ref 11–51)

## 2016-10-13 MED ORDER — SODIUM CHLORIDE 0.9 % IV SOLN
INTRAVENOUS | Status: DC
  Administered 2016-10-13 – 2016-10-14 (×2): via INTRAVENOUS

## 2016-10-13 MED ORDER — IOPAMIDOL (ISOVUE-300) INJECTION 61%
INTRAVENOUS | Status: AC
  Administered 2016-10-13: 80 mL
  Filled 2016-10-13: qty 100

## 2016-10-13 MED ORDER — SODIUM CHLORIDE 0.9 % IV BOLUS (SEPSIS)
250.0000 mL | Freq: Once | INTRAVENOUS | Status: AC
  Administered 2016-10-13: 250 mL via INTRAVENOUS

## 2016-10-13 MED ORDER — TRAMADOL HCL 50 MG PO TABS
50.0000 mg | ORAL_TABLET | Freq: Once | ORAL | Status: AC
  Administered 2016-10-13: 50 mg via ORAL
  Filled 2016-10-13: qty 1

## 2016-10-13 MED ORDER — ONDANSETRON 4 MG PO TBDP
4.0000 mg | ORAL_TABLET | Freq: Once | ORAL | Status: AC
  Administered 2016-10-13: 4 mg via ORAL

## 2016-10-13 MED ORDER — ONDANSETRON HCL 4 MG/2ML IJ SOLN: 4.0000 mg | Freq: Once | INTRAMUSCULAR | Status: DC

## 2016-10-13 MED ORDER — ONDANSETRON 4 MG PO TBDP
ORAL_TABLET | ORAL | Status: AC
  Administered 2016-10-13: 4 mg via ORAL
  Filled 2016-10-13: qty 1

## 2016-10-13 NOTE — ED Notes (Signed)
Unsuccessful IV attempt by other nurse. Waiting on IV team to help place.

## 2016-10-13 NOTE — ED Notes (Addendum)
Two unsuccessful attempts to start an Iv. Will have another nurse try to insert IV.

## 2016-10-13 NOTE — ED Provider Notes (Addendum)
[facility]-EMERGENCY DEPT Provider Note   CSN: [id number] Arrival date & time: 10/13/16  1533     History   Chief Complaint Chief Complaint  Patient presents with  . Emesis  . Hip Pain  . Abdominal Pain    HPI Nicole Dean is a 75 y.o. female.  Patient with four-day history of nausea vomiting. No diarrhea. Bowel movements have been normal. No fevers. Patient also with complaint of right hip pain no history of any fall or injury. Patient status post coronary bypass surgery in May 2017. And then repair of abdominal aortic aneurysm as well as aortobifemoral bypass surgery in August 2017. Since that time patient has had some abdominal discomfort. But the nausea and vomiting is new. Patient contacted her vascular surgeon today recommended to come in for CT of the abdomen. Patient states she's lost 6 pounds over the past few days. Patient states is no blood in the vomit. No blood in her bowel movements.      Past Medical History:  Diagnosis Date  . AAA (abdominal aortic aneurysm) (HCC)    VVS- following   . Anemia   . Anxiety   . Arthritis    hands  . Coronary artery disease   . Depression   . GERD (gastroesophageal reflux disease)   . History of bronchitis   . History of pneumonia   . Hyperlipidemia   . Hypertension   . Numbness and tingling of right leg   . Shortness of breath dyspnea    sometimes even with resting; pt. denies since heart surgery  . Wears glasses     Patient Active Problem List   Diagnosis Date Noted  . Status post AAA (abdominal aortic aneurysm) repair   . Coronary artery disease involving coronary bypass graft of native heart without angina pectoris   . Depression   . Anxiety state   . SOB (shortness of breath)   . Atherosclerosis of native artery of both lower extremities with intermittent claudication (HCC)   . Acute blood loss anemia   . Hypotension   . Tachypnea   . Leukocytosis   . Hyponatremia   . Abdominal aortic aneurysm without rupture  (HCC) 07/22/2016  . CAD (coronary artery disease) 06/04/2016  . 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 Abnormal nuclear stress test 03/31/2016  . AAA (abdominal aortic aneurysm) (HCC) 03/25/2016  . Atherosclerotic peripheral vascular disease (HCC) 03/03/2016  . Depression with anxiety 08/09/2012  . DEGENERATIVE JOINT DISEASE 01/30/2010  . OSTEOPENIA 12/08/2007  . INSOMNIA, CHRONIC 08/30/2007  . DISORDER, DEPRESSIVE NEC 08/30/2007  . GERD 08/30/2007  . DIVERTICULAR DISEASE 08/30/2007  . HYPERLIPIDEMIA, MIXED 03/09/2007  . HYPERTENSION, BENIGN ESSENTIAL 03/09/2007    Past Surgical History:  Procedure Laterality Date  . ABDOMINAL AORTIC ANEURYSM REPAIR N/A 07/22/2016   Procedure: JUXTARENAL  ABDOMINAL AORTIC ANEURYSM REPAIR;  Surgeon: Chuck Hint, MD;  Location: [facility] OR;  Service: Vascular;  Laterality: N/A;  . AORTA - BILATERAL FEMORAL ARTERY BYPASS GRAFT N/A 07/22/2016   Procedure: AORTA BIFEMORAL BYPASS GRAFT;  Surgeon: Chuck Hint, MD;  Location: [facility] OR;  Service: Vascular;  Laterality: N/A;  . CARDIAC CATHETERIZATION N/A 03/31/2016   Procedure: Left Heart Cath and Coronary Angiography;  Surgeon: Peter M Swaziland, MD;  Location: [facility] INVASIVE CV LAB;  Service: Cardiovascular;  Laterality: N/A;  . COLONOSCOPY    . CORONARY ARTERY BYPASS GRAFT N/A 04/04/2016   Procedure: CORONARY ARTERY BYPASS GRAFTING (CABG) x 4 using bilateral internal mammary arteries and right  saphenous vein harvested by endovein;  Surgeon: Alleen Borne, MD;  Location: [facility] OR;  Service: Open Heart Surgery;  Laterality: N/A;  . EYE SURGERY Bilateral    cataracts removed./ IOL  . PERIPHERAL VASCULAR CATHETERIZATION N/A 03/03/2016   Procedure: Abdominal Aortogram w/Lower Extremity;  Surgeon: Chuck Hint, MD;  Location: [facility] INVASIVE CV LAB;  Service: Cardiovascular;  Laterality: N/A;  . REPAIR ILIAC ARTERY Right 07/22/2016   Procedure: RIGHT COMMON ILIAC ANEURYSM REPAIR;  Surgeon: Chuck Hint, MD;   Location: [facility] OR;  Service: Vascular;  Laterality: Right;  . SHOULDER ARTHROSCOPY W/ ROTATOR CUFF REPAIR Right   . TEE WITHOUT CARDIOVERSION N/A 04/04/2016   Procedure: TRANSESOPHAGEAL ECHOCARDIOGRAM (TEE);  Surgeon: Alleen Borne, MD;  Location: [facility] OR;  Service: Open Heart Surgery;  Laterality: N/A;    OB History    No data available       Home Medications    Prior to Admission medications   Medication Sig Start Date End Date Taking? Authorizing Provider  aspirin EC 325 MG EC tablet Take 1 tablet (325 mg total) by mouth daily. 04/10/16  Yes Erin R Barrett, PA-C  atorvastatin (LIPITOR) 40 MG tablet Take 1 tablet (40 mg total) by mouth daily. Patient taking differently: Take 40 mg by mouth daily at 6 PM.  03/25/16  Yes Vesta Mixer, MD  calcium carbonate (TUMS - DOSED IN MG ELEMENTAL CALCIUM) 500 MG chewable tablet Chew 5 tablets by mouth daily.   Yes Historical Provider, MD  carvedilol (COREG) 6.25 MG tablet Take 1 tablet (6.25 mg total) by mouth 2 (two) times daily. 04/29/16  Yes Janetta Hora, PA-C  esomeprazole (NEXIUM) 40 MG capsule Take 40 mg by mouth daily at 12 noon.   Yes Historical Provider, MD  hydrochlorothiazide (HYDRODIURIL) 25 MG tablet Take 25 mg by mouth daily. 05/26/16  Yes Historical Provider, MD  LORazepam (ATIVAN) 0.5 MG tablet Take 0.5 mg by mouth at bedtime.   Yes Historical Provider, MD  Multiple Vitamins-Iron (MULTIVITAMIN/IRON PO) Take 1 tablet by mouth daily.   Yes Historical Provider, MD  omeprazole (PRILOSEC) 40 MG capsule Take 40 mg by mouth at bedtime.  02/05/16  Yes Historical Provider, MD  ondansetron (ZOFRAN-ODT) 8 MG disintegrating tablet Take 8 mg by mouth every 8 (eight) hours as needed for nausea or vomiting.   Yes Historical Provider, MD  potassium chloride (K-DUR) 10 MEQ tablet Take 1 tablet (10 mEq total) by mouth daily. 06/05/16  Yes Vesta Mixer, MD  sertraline (ZOLOFT) 100 MG tablet Take 100 mg by mouth daily with breakfast.  02/05/16  Yes  Historical Provider, MD  traMADol (ULTRAM) 50 MG tablet Take 1-2 tablets (50-100 mg total) by mouth every 4 (four) hours as needed for moderate pain. 04/10/16  Yes Erin R Barrett, PA-C  ferrous fumarate-b12-vitamic C-folic acid (TRINSICON / FOLTRIN) capsule Take 1 capsule by mouth every morning. Patient not taking: Reported on 10/13/2016 04/10/16   Rae Roam Barrett, PA-C  oxyCODONE-acetaminophen (PERCOCET/ROXICET) 5-325 MG tablet Take 1-2 tablets q 6 hrs/ prn/ pain Patient not taking: Reported on 10/13/2016 08/08/16   Maeola Harman, MD    Family History Family History  Problem Relation Age of Onset  . Cancer Father     stomach    Social History Social History  Substance Use Topics  . Smoking status: Former Smoker    Packs/day: 1.50    Years: 35.00    Types: Cigarettes    Quit date: 02/15/1996  .  Smokeless tobacco: Never Used  . Alcohol use No     Allergies   Codeine and Penicillins   Review of Systems Review of Systems  Constitutional: Negative for fever.  HENT: Negative for congestion.   Eyes: Negative for visual disturbance.  Respiratory: Negative for shortness of breath.   Cardiovascular: Negative for chest pain.  Gastrointestinal: Positive for abdominal pain, nausea and vomiting. Negative for blood in stool.  Genitourinary: Negative for dysuria.  Musculoskeletal: Positive for arthralgias. Negative for back pain.  Skin: Negative for rash.  Neurological: Negative for headaches.  Hematological: Does not bruise/bleed easily.  Psychiatric/Behavioral: Negative for confusion.     Physical Exam Updated Vital Signs BP (!) 110/50   Pulse 86   Temp 97.7 F (36.5 C) (Oral)   Resp 17   SpO2 95%   Physical Exam  Constitutional: She is oriented to person, place, and time. She appears well-developed and well-nourished. No distress.  HENT:  Head: Normocephalic and atraumatic.  Mouth/Throat: Oropharynx is clear and moist.  Eyes: Conjunctivae and EOM are normal.  Pupils are equal, round, and reactive to light.  Neck: Normal range of motion.  Cardiovascular: Normal rate, regular rhythm and normal heart sounds.   Pulmonary/Chest: Effort normal and breath sounds normal. No respiratory distress.  Abdominal: Soft. Bowel sounds are normal. There is no tenderness.  Surgical wounds well healed. Palpable hard bump right hip which I think is actually part of her hip bone structure. No redness nontender. No significant tenderness right groin. No guarding.  Musculoskeletal: Normal range of motion.  Neurological: She is alert and oriented to person, place, and time. No cranial nerve deficit or sensory deficit. She exhibits normal muscle tone. Coordination normal.  Skin: Skin is warm. No erythema.  Nursing note and vitals reviewed.    ED Treatments / Results  Labs (all labs ordered are listed, but only abnormal results are displayed) Labs Reviewed  COMPREHENSIVE METABOLIC PANEL - Abnormal; Notable for the following:       Result Value   Sodium 134 (*)    Chloride 97 (*)    Glucose, Bld 105 (*)    BUN 27 (*)    Creatinine, Ser 1.14 (*)    ALT 10 (*)    GFR calc non Af Amer 46 (*)    GFR calc Af Amer 54 (*)    All other components within normal limits  CBC - Abnormal; Notable for the following:    WBC 15.7 (*)    RDW 17.0 (*)    Platelets 428 (*)    All other components within normal limits  URINALYSIS, ROUTINE W REFLEX MICROSCOPIC (NOT AT [facility]) - Abnormal; Notable for the following:    Color, Urine AMBER (*)    APPearance CLOUDY (*)    Bilirubin Urine SMALL (*)    Leukocytes, UA SMALL (*)    All other components within normal limits  URINE MICROSCOPIC-ADD ON - Abnormal; Notable for the following:    Squamous Epithelial / LPF 0-5 (*)    Casts HYALINE CASTS (*)    Crystals CA OXALATE CRYSTALS (*)    All other components within normal limits  LIPASE, BLOOD    EKG  EKG Interpretation None       Radiology Ct Abdomen Pelvis W  Contrast  Result Date: 10/13/2016 CLINICAL DATA:  Acute onset of generalized abdominal pain, nausea vomiting. Chronic weight loss. Severe focal tenderness at the right hip. Initial encounter. EXAM: CT ABDOMEN AND PELVIS WITH CONTRAST TECHNIQUE: Multidetector CT  imaging of the abdomen and pelvis was performed using the standard protocol following bolus administration of intravenous contrast. CONTRAST:  80mL ISOVUE-300 IOPAMIDOL (ISOVUE-300) INJECTION 61% COMPARISON:  CT of the abdomen and pelvis from 02/19/2016 FINDINGS: Lower chest: The visualized lung bases are grossly clear. Scattered coronary artery calcifications are seen. 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 Hepatobiliary: The gallbladder is diffusely inflamed and edematous, with scattered air noted in the gallbladder. An underlying stone is again seen. The appearance is suspicious for emphysematous cholecystitis. Alternatively, the first segment of the duodenum directly abuts the gallbladder, with associated soft tissue inflammation and question of communication. A cholecystoduodenal fistula cannot be excluded. There is underlying edema and congestion at the gallbladder fossa. The liver is otherwise unremarkable. A single tiny focus of air at the left hepatic lobe likely reflects pneumobilia. The common bile duct is grossly unremarkable in appearance. Pancreas: The pancreas is grossly unremarkable in appearance. 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 Spleen: The spleen is unremarkable in appearance. Adrenals/Urinary Tract: The adrenal glands are unremarkable in appearance. Scattered right renal cysts are noted. The kidneys are otherwise unremarkable. There is no evidence of hydronephrosis. No renal or ureteral stones are seen. No perinephric  stranding is appreciated. Stomach/Bowel: The stomach is grossly unremarkable in appearance. The visualized small bowel is otherwise grossly unremarkable. The appendix remains normal in caliber. There is no evidence of appendicitis. Mild diverticulosis is noted along the sigmoid colon, without evidence of diverticulitis. Vascular/Lymphatic: The patient is status post aortobifemoral bypass graft and abdominal aortic aneurysm repair. Underlying diffuse calcification is noted. There is no evidence of recurrent aneurysm. Minimal nonspecific soft tissue inflammation is seen tracking anterior to the right common iliac artery graft. No definite retroperitoneal or pelvic sidewall lymphadenopathy is seen. Reproductive: The bladder is mildly distended and within normal limits. The uterus is grossly unremarkable in appearance. The ovaries are relatively symmetric. No suspicious adnexal masses are seen. Other: Postoperative change is noted along the anterior abdominal wall, and at both lower quadrants. Vague diffuse soft tissue inflammation is noted overlying the right greater femoral trochanter, with trace associated fluid extending from the greater femoral trochanter into the soft tissues, measuring approximately 3.3 x 1.8 x 0.7 cm. This could reflect an infected bursitis. Evolving abscess cannot be entirely excluded. Musculoskeletal: No acute osseous abnormalities are identified. Multilevel vacuum phenomenon is noted along the lower thoracic and lumbar spine. There is grade 2 anterolisthesis of L4 on L5, reflecting underlying facet disease. The visualized musculature is unremarkable in appearance. IMPRESSION: 1. Gallbladder diffusely inflamed and edematous, with scattered air in the gallbladder. Underlying stone seen. The appearance is suspicious for emphysematous cholecystitis. Alternatively, the first segment of the duodenum directly abuts the gallbladder, with associated soft tissue inflammation and question of  communication. A cholecystoduodenal fistula cannot be excluded. 2. Underlying edema and congestion at the gallbladder fossa. Tiny focus of suspected pneumobilia noted at the left hepatic lobe. 3. Vague diffuse soft tissue inflammation overlying the right greater femoral trochanter, with trace associated fluid extending from the greater femoral trochanter into the soft tissues, measuring approximately 3.3 x 1.8 x 0.7 cm. This could reflect an infected bursitis, with surrounding phlegmon. Evolving abscess cannot be entirely excluded. 4. 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 5. Scattered coronary artery calcifications seen. 6. 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 7. Aortobifemoral bypass graft appears grossly patent. Underlying diffuse aortic atherosclerosis. Nonspecific inflammation along the anterior aspect of the right common iliac artery graft. 8. Mild degenerative change at the lower thoracic and lumbar spine. These results were called by telephone at the time of interpretation on 10/13/2016 at 11:40 pm to Dr. Vanetta Mulders, who verbally acknowledged these results. Electronically Signed   By: Roanna Raider M.D.   On: 10/13/2016 23:43    Procedures Procedures (including critical care time)  Medications Ordered in ED Medications  0.9 %  sodium chloride infusion ( Intravenous New Bag/Given 10/13/16 2110)  ondansetron (ZOFRAN) injection 4 mg (not administered)  ciprofloxacin (CIPRO) IVPB 400 mg (not administered)  sodium chloride 0.9 % bolus 250 mL (0 mLs Intravenous Stopped 10/13/16 2257)  ondansetron (ZOFRAN-ODT) disintegrating tablet 4 mg (4 mg Oral Given 10/13/16 2040)  iopamidol (ISOVUE-300) 61 % injection (80 mLs  Contrast Given 10/13/16 2206)  traMADol (ULTRAM) tablet 50 mg (50 mg Oral Given 10/13/16 2318)      Initial Impression / Assessment and Plan / ED Course  I have reviewed the triage vital signs and the nursing notes.  Pertinent labs & imaging results that were available during my care of the patient were reviewed by me and considered in my medical decision making (see chart for details).  Clinical Course     Patient with complaint of generalized abdominal pain. Somewhat increased in her right groin area and her right hip area. Patient status post coronary bypass surgery in May 2017 status post abdominal aortic aneurysm repair along with the aorto bifemoral artery bypass in August 2017. Patient states that she's had some abdominal discomfort since that time. Patient about the 4  days ago started with the nausea and vomiting and not able to keep anything down.  Patient clinically looks well. Vital signs without significant abnormality. White blood cell count is elevated but it has been since August. CT scan abdomen and pelvis is been done to evaluate the right hip and the abdominal complaint. Patient is followed by vascular surgery locally. Patient's primary care doctor his [facility]. Disposition will be based on the CT scan.   CT scan consistent with acute cholecystitis. Patient without any more significant tenderness and right upper quadrant versus the rest of the abdomen. But certainly her 4 days of nausea and vomiting could be consistent with this. Still has the elevated white blood cell count. No real significant liver function test abnormalities. Will contact general surgery for consultation on this. Patient most likely require admission for acute cholecystitis.  Other CT finding shows a fluid collection around the greater trochanter on the right side this would explain the bulge that is there. Skin was not red. Do not feel that there's any significant infection there but patient does complain about some pain in that area. Vascular surgery repair all looks pretty of  normal.    Final Clinical Impressions(s) / ED Diagnoses   Final diagnoses:  Generalized abdominal pain  Acute cholecystitis    New Prescriptions New Prescriptions   No medications on file     Vanetta Mulders, MD 10/13/16 2219    Vanetta Mulders, MD 10/14/16 0010  Addendum:. Patient discussed with general surgery. They will evaluate recommending Cipro antibiotic. Also recommending admission by internal medicine. General surgery will come  and evaluate the patient.    Vanetta MuldersScott Nasser Ku, MD 10/14/16 0021

## 2016-10-13 NOTE — ED Notes (Signed)
Pt transported to CT ?

## 2016-10-13 NOTE — Telephone Encounter (Signed)
[name] called to triage to report that she has had severe N&V x 4-5 days with a weight loss of 6 lbs, down to 112 lb. She has no energy and still has the same RLQ pain as when seen in the office on 09-16-16 with our NP. She states that she doesn't know if she has or had a fever. Patient had recent juxtarenal aortobifemoral BPG by Dr. Edilia Boickson on 07-22-16.   I have [facility] and workup of this. She is reluctant but says she will go to the ED asap.

## 2016-10-13 NOTE — ED Notes (Signed)
IV team at bedside 

## 2016-10-13 NOTE — ED Triage Notes (Signed)
Pt sts N/V, weight loss and not feeling well since having AAA repair in August; pt sts knot on right hip with pain x several weeks

## 2016-10-14 ENCOUNTER — Inpatient Hospital Stay (HOSPITAL_COMMUNITY): Payer: Medicare HMO

## 2016-10-14 ENCOUNTER — Encounter (HOSPITAL_COMMUNITY): Payer: Self-pay | Admitting: Internal Medicine

## 2016-10-14 DIAGNOSIS — Z01810 Encounter for preprocedural cardiovascular examination: Secondary | ICD-10-CM | POA: Diagnosis not present

## 2016-10-14 DIAGNOSIS — K219 Gastro-esophageal reflux disease without esophagitis: Secondary | ICD-10-CM | POA: Diagnosis present

## 2016-10-14 DIAGNOSIS — D62 Acute posthemorrhagic anemia: Secondary | ICD-10-CM | POA: Diagnosis not present

## 2016-10-14 DIAGNOSIS — I110 Hypertensive heart disease with heart failure: Secondary | ICD-10-CM | POA: Diagnosis present

## 2016-10-14 DIAGNOSIS — F419 Anxiety disorder, unspecified: Secondary | ICD-10-CM | POA: Diagnosis present

## 2016-10-14 DIAGNOSIS — K810 Acute cholecystitis: Secondary | ICD-10-CM | POA: Diagnosis present

## 2016-10-14 DIAGNOSIS — I10 Essential (primary) hypertension: Secondary | ICD-10-CM

## 2016-10-14 DIAGNOSIS — F329 Major depressive disorder, single episode, unspecified: Secondary | ICD-10-CM | POA: Diagnosis present

## 2016-10-14 DIAGNOSIS — Z8679 Personal history of other diseases of the circulatory system: Secondary | ICD-10-CM | POA: Diagnosis not present

## 2016-10-14 DIAGNOSIS — R000 Tachycardia, unspecified: Secondary | ICD-10-CM | POA: Diagnosis not present

## 2016-10-14 DIAGNOSIS — Z951 Presence of aortocoronary bypass graft: Secondary | ICD-10-CM

## 2016-10-14 DIAGNOSIS — Z9889 Other specified postprocedural states: Secondary | ICD-10-CM

## 2016-10-14 DIAGNOSIS — R4781 Slurred speech: Secondary | ICD-10-CM | POA: Diagnosis not present

## 2016-10-14 DIAGNOSIS — R5082 Postprocedural fever: Secondary | ICD-10-CM | POA: Diagnosis not present

## 2016-10-14 DIAGNOSIS — I951 Orthostatic hypotension: Secondary | ICD-10-CM | POA: Diagnosis not present

## 2016-10-14 DIAGNOSIS — Z880 Allergy status to penicillin: Secondary | ICD-10-CM | POA: Diagnosis not present

## 2016-10-14 DIAGNOSIS — E43 Unspecified severe protein-calorie malnutrition: Secondary | ICD-10-CM | POA: Diagnosis present

## 2016-10-14 DIAGNOSIS — I25810 Atherosclerosis of coronary artery bypass graft(s) without angina pectoris: Secondary | ICD-10-CM

## 2016-10-14 DIAGNOSIS — K8000 Calculus of gallbladder with acute cholecystitis without obstruction: Secondary | ICD-10-CM | POA: Diagnosis present

## 2016-10-14 DIAGNOSIS — K567 Ileus, unspecified: Secondary | ICD-10-CM | POA: Diagnosis not present

## 2016-10-14 DIAGNOSIS — E876 Hypokalemia: Secondary | ICD-10-CM | POA: Diagnosis not present

## 2016-10-14 DIAGNOSIS — I5032 Chronic diastolic (congestive) heart failure: Secondary | ICD-10-CM | POA: Diagnosis present

## 2016-10-14 DIAGNOSIS — R1084 Generalized abdominal pain: Secondary | ICD-10-CM

## 2016-10-14 DIAGNOSIS — Y92239 Unspecified place in hospital as the place of occurrence of the external cause: Secondary | ICD-10-CM | POA: Diagnosis not present

## 2016-10-14 DIAGNOSIS — Z885 Allergy status to narcotic agent status: Secondary | ICD-10-CM | POA: Diagnosis not present

## 2016-10-14 DIAGNOSIS — I2510 Atherosclerotic heart disease of native coronary artery without angina pectoris: Secondary | ICD-10-CM | POA: Diagnosis present

## 2016-10-14 DIAGNOSIS — E785 Hyperlipidemia, unspecified: Secondary | ICD-10-CM | POA: Diagnosis present

## 2016-10-14 DIAGNOSIS — Z87891 Personal history of nicotine dependence: Secondary | ICD-10-CM | POA: Diagnosis not present

## 2016-10-14 DIAGNOSIS — B9620 Unspecified Escherichia coli [E. coli] as the cause of diseases classified elsewhere: Secondary | ICD-10-CM | POA: Diagnosis present

## 2016-10-14 DIAGNOSIS — K660 Peritoneal adhesions (postprocedural) (postinfection): Secondary | ICD-10-CM | POA: Diagnosis present

## 2016-10-14 LAB — BASIC METABOLIC PANEL
Anion gap: 7 (ref 5–15)
BUN: 21 mg/dL — AB (ref 6–20)
CHLORIDE: 105 mmol/L (ref 101–111)
CO2: 23 mmol/L (ref 22–32)
Calcium: 8.6 mg/dL — ABNORMAL LOW (ref 8.9–10.3)
Creatinine, Ser: 0.78 mg/dL (ref 0.44–1.00)
GFR calc Af Amer: >60 mL/min (ref >60)
GFR calc non Af Amer: >60 mL/min (ref >60)
GLUCOSE: 100 mg/dL — AB (ref 65–99)
POTASSIUM: 3.6 mmol/L (ref 3.5–5.1)
Sodium: 135 mmol/L (ref 135–145)

## 2016-10-14 LAB — HEPATIC FUNCTION PANEL
ALBUMIN: 2.6 g/dL — AB (ref 3.5–5.0)
ALK PHOS: 74 U/L (ref 38–126)
ALT: 8 U/L — ABNORMAL LOW (ref 14–54)
AST: 19 U/L (ref 15–41)
Bilirubin, Direct: <0.1 mg/dL — ABNORMAL LOW (ref 0.1–0.5)
TOTAL PROTEIN: 5.6 g/dL — AB (ref 6.5–8.1)
Total Bilirubin: 0.3 mg/dL (ref 0.3–1.2)

## 2016-10-14 LAB — CBC WITH DIFFERENTIAL/PLATELET
Basophils Absolute: 0.1 10*3/uL (ref 0.0–0.1)
Basophils Relative: 1 %
EOS PCT: 1 %
Eosinophils Absolute: 0.1 10*3/uL (ref 0.0–0.7)
HEMATOCRIT: 31.5 % — AB (ref 36.0–46.0)
HEMOGLOBIN: 9.9 g/dL — AB (ref 12.0–15.0)
LYMPHS ABS: 2.2 10*3/uL (ref 0.7–4.0)
LYMPHS PCT: 30 %
MCH: 26.6 pg (ref 26.0–34.0)
MCHC: 31.4 g/dL (ref 30.0–36.0)
MCV: 84.7 fL (ref 78.0–100.0)
Monocytes Absolute: 0.6 10*3/uL (ref 0.1–1.0)
Monocytes Relative: 8 %
NEUTROS ABS: 4.6 10*3/uL (ref 1.7–7.7)
NEUTROS PCT: 60 %
Platelets: 296 10*3/uL (ref 150–400)
RBC: 3.72 MIL/uL — AB (ref 3.87–5.11)
RDW: 17.2 % — ABNORMAL HIGH (ref 11.5–15.5)
WBC: 7.5 10*3/uL (ref 4.0–10.5)

## 2016-10-14 LAB — ECHOCARDIOGRAM COMPLETE
HEIGHTINCHES: 62 in
WEIGHTICAEL: 1931.2 [oz_av]

## 2016-10-14 MED ORDER — ORAL CARE MOUTH RINSE
15.0000 mL | Freq: Two times a day (BID) | OROMUCOSAL | Status: DC
  Administered 2016-10-14 – 2016-10-20 (×9): 15 mL via OROMUCOSAL

## 2016-10-14 MED ORDER — ACETAMINOPHEN 325 MG PO TABS
650.0000 mg | ORAL_TABLET | Freq: Four times a day (QID) | ORAL | Status: DC | PRN
  Administered 2016-10-16 – 2016-10-19 (×2): 650 mg via ORAL
  Filled 2016-10-14 (×2): qty 2

## 2016-10-14 MED ORDER — CIPROFLOXACIN IN D5W 400 MG/200ML IV SOLN
400.0000 mg | Freq: Once | INTRAVENOUS | Status: AC
Start: 2016-10-14 — End: 2016-10-14
  Administered 2016-10-14: 400 mg via INTRAVENOUS
  Filled 2016-10-14: qty 200

## 2016-10-14 MED ORDER — ACETAMINOPHEN 650 MG RE SUPP
650.0000 mg | Freq: Four times a day (QID) | RECTAL | Status: DC | PRN
Start: 2016-10-14 — End: 2016-10-19

## 2016-10-14 MED ORDER — CHLORHEXIDINE GLUCONATE CLOTH 2 % EX PADS: 6.0000 | MEDICATED_PAD | Freq: Once | CUTANEOUS | Status: DC

## 2016-10-14 MED ORDER — SODIUM CHLORIDE 0.9 % IV SOLN: INTRAVENOUS | Status: AC

## 2016-10-14 MED ORDER — ONDANSETRON HCL 4 MG PO TABS: 4.0000 mg | ORAL_TABLET | Freq: Four times a day (QID) | ORAL | Status: DC | PRN

## 2016-10-14 MED ORDER — LORAZEPAM 0.5 MG PO TABS
0.5000 mg | ORAL_TABLET | Freq: Once | ORAL | Status: AC
  Administered 2016-10-14: 0.5 mg via ORAL
  Filled 2016-10-14: qty 1

## 2016-10-14 MED ORDER — CIPROFLOXACIN IN D5W 400 MG/200ML IV SOLN
400.0000 mg | Freq: Two times a day (BID) | INTRAVENOUS | Status: DC
  Administered 2016-10-14 – 2016-10-15 (×3): 400 mg via INTRAVENOUS
  Filled 2016-10-14 (×4): qty 200

## 2016-10-14 MED ORDER — METRONIDAZOLE IN NACL 5-0.79 MG/ML-% IV SOLN
500.0000 mg | Freq: Three times a day (TID) | INTRAVENOUS | Status: DC
  Administered 2016-10-14 – 2016-10-15 (×4): 500 mg via INTRAVENOUS
  Filled 2016-10-14 (×4): qty 100

## 2016-10-14 MED ORDER — METOPROLOL TARTRATE 5 MG/5ML IV SOLN
2.5000 mg | Freq: Three times a day (TID) | INTRAVENOUS | Status: DC
  Administered 2016-10-14 – 2016-10-15 (×4): 2.5 mg via INTRAVENOUS
  Filled 2016-10-14 (×6): qty 5

## 2016-10-14 MED ORDER — SODIUM CHLORIDE 0.9 % IV SOLN
INTRAVENOUS | Status: DC
  Administered 2016-10-14: 17:00:00 via INTRAVENOUS

## 2016-10-14 MED ORDER — CHLORHEXIDINE GLUCONATE CLOTH 2 % EX PADS
6.0000 | MEDICATED_PAD | Freq: Once | CUTANEOUS | Status: AC
  Administered 2016-10-14: 6 via TOPICAL

## 2016-10-14 MED ORDER — ONDANSETRON HCL 4 MG/2ML IJ SOLN: 4.0000 mg | Freq: Four times a day (QID) | INTRAMUSCULAR | Status: DC | PRN

## 2016-10-14 MED ORDER — DEXTROSE-NACL 5-0.9 % IV SOLN
INTRAVENOUS | Status: DC
  Administered 2016-10-14: 03:00:00 via INTRAVENOUS

## 2016-10-14 NOTE — H&P (Signed)
History and Physical    Nicole Dean ZOX:[id number] DOB: 07-14-1941 DOA: 10/13/2016  PCP: [facility]  Patient coming from: Home.  Chief Complaint: Abdominal pain nausea vomiting.  HPI: Nicole Dean is a 75 y.o. female with history of CAD status post CABG in May of this year, abdominal aneurysm repair in August of this year following which patient has been having persistent abdominal pain presents to the ER because of acute worsening of abdominal pain mostly in the right upper quadrant with nausea vomiting last 4 days. Denies any diarrhea fever chills or any chest pain or shortness of breath. CT scan of the abdomen and pelvis done in the ER shows features concerning for cholecystitis with possible fistula. On-call general surgeon Dr. Janee Morn has been consulted and patient has been admitted for further management.   ED Course: CT scan shows features concerning for acute cholecystitis with possible fistula. Patient was started on Cipro. General surgery consulted.  Review of Systems: As per HPI, rest all negative.   Past Medical History:  Diagnosis Date  . AAA (abdominal aortic aneurysm) (HCC)    VVS- following   . Anemia   . Anxiety   . Arthritis    hands  . Coronary artery disease   . Depression   . GERD (gastroesophageal reflux disease)   . History of bronchitis   . History of pneumonia   . Hyperlipidemia   . Hypertension   . Numbness and tingling of right leg   . Shortness of breath dyspnea    sometimes even with resting; pt. denies since heart surgery  . Wears glasses     Past Surgical History:  Procedure Laterality Date  . ABDOMINAL AORTIC ANEURYSM REPAIR N/A 07/22/2016   Procedure: JUXTARENAL  ABDOMINAL AORTIC ANEURYSM REPAIR;  Surgeon: Chuck Hint, MD;  Location: [facility] OR;  Service: Vascular;  Laterality: N/A;  . AORTA - BILATERAL FEMORAL ARTERY BYPASS GRAFT N/A 07/22/2016   Procedure: AORTA BIFEMORAL BYPASS GRAFT;  Surgeon:  Chuck Hint, MD;  Location: [facility] OR;  Service: Vascular;  Laterality: N/A;  . CARDIAC CATHETERIZATION N/A 03/31/2016   Procedure: Left Heart Cath and Coronary Angiography;  Surgeon: Peter M Swaziland, MD;  Location: [facility] INVASIVE CV LAB;  Service: Cardiovascular;  Laterality: N/A;  . COLONOSCOPY    . CORONARY ARTERY BYPASS GRAFT N/A 04/04/2016   Procedure: CORONARY ARTERY BYPASS GRAFTING (CABG) x 4 using bilateral internal mammary arteries and right saphenous vein harvested by endovein;  Surgeon: Alleen Borne, MD;  Location: [facility] OR;  Service: Open Heart Surgery;  Laterality: N/A;  . EYE SURGERY Bilateral    cataracts removed./ IOL  . PERIPHERAL VASCULAR CATHETERIZATION N/A 03/03/2016   Procedure: Abdominal Aortogram w/Lower Extremity;  Surgeon: Chuck Hint, MD;  Location: [facility] INVASIVE CV LAB;  Service: Cardiovascular;  Laterality: N/A;  . REPAIR ILIAC ARTERY Right 07/22/2016   Procedure: RIGHT COMMON ILIAC ANEURYSM REPAIR;  Surgeon: Chuck Hint, MD;  Location: [facility] OR;  Service: Vascular;  Laterality: Right;  . SHOULDER ARTHROSCOPY W/ ROTATOR CUFF REPAIR Right   . TEE WITHOUT CARDIOVERSION N/A 04/04/2016   Procedure: TRANSESOPHAGEAL ECHOCARDIOGRAM (TEE);  Surgeon: Alleen Borne, MD;  Location: [facility] OR;  Service: Open Heart Surgery;  Laterality: N/A;     reports that she quit smoking about 20 years ago. Her smoking use included Cigarettes. She has a 52.50 pack-year smoking history. She has never used smokeless tobacco. She reports that she does not drink alcohol  or use drugs.  Allergies  Allergen Reactions  . Codeine Nausea And Vomiting    Severe, ended up in ED  . Penicillins Nausea Only and Other (See Comments)    Has patient had a PCN reaction causing immediate rash, facial/tongue/throat swelling, SOB or lightheadedness with hypotension: Yes Has patient had a PCN reaction causing severe rash involving mucus membranes or skin necrosis: No Has patient had a PCN reaction that  required hospitalization No Has patient had a PCN reaction occurring within the last 10 years: No If all of the above answers are "NO", then may proceed with Cephalosporin use.     Family History  Problem Relation Age of Onset  . Cancer Father     stomach    Prior to Admission medications   Medication Sig Start Date End Date Taking? Authorizing Provider  aspirin EC 325 MG EC tablet Take 1 tablet (325 mg total) by mouth daily. 04/10/16  Yes Erin R Barrett, PA-C  atorvastatin (LIPITOR) 40 MG tablet Take 1 tablet (40 mg total) by mouth daily. Patient taking differently: Take 40 mg by mouth daily at 6 PM.  03/25/16  Yes Vesta Mixer, MD  calcium carbonate (TUMS - DOSED IN MG ELEMENTAL CALCIUM) 500 MG chewable tablet Chew 5 tablets by mouth daily.   Yes Historical Provider, MD  carvedilol (COREG) 6.25 MG tablet Take 1 tablet (6.25 mg total) by mouth 2 (two) times daily. 04/29/16  Yes Janetta Hora, PA-C  esomeprazole (NEXIUM) 40 MG capsule Take 40 mg by mouth daily at 12 noon.   Yes Historical Provider, MD  hydrochlorothiazide (HYDRODIURIL) 25 MG tablet Take 25 mg by mouth daily. 05/26/16  Yes Historical Provider, MD  LORazepam (ATIVAN) 0.5 MG tablet Take 0.5 mg by mouth at bedtime.   Yes Historical Provider, MD  Multiple Vitamins-Iron (MULTIVITAMIN/IRON PO) Take 1 tablet by mouth daily.   Yes Historical Provider, MD  omeprazole (PRILOSEC) 40 MG capsule Take 40 mg by mouth at bedtime.  02/05/16  Yes Historical Provider, MD  ondansetron (ZOFRAN-ODT) 8 MG disintegrating tablet Take 8 mg by mouth every 8 (eight) hours as needed for nausea or vomiting.   Yes Historical Provider, MD  potassium chloride (K-DUR) 10 MEQ tablet Take 1 tablet (10 mEq total) by mouth daily. 06/05/16  Yes Vesta Mixer, MD  sertraline (ZOLOFT) 100 MG tablet Take 100 mg by mouth daily with breakfast.  02/05/16  Yes Historical Provider, MD  traMADol (ULTRAM) 50 MG tablet Take 1-2 tablets (50-100 mg total) by mouth every 4  (four) hours as needed for moderate pain. 04/10/16  Yes Erin R Barrett, PA-C  ferrous fumarate-b12-vitamic C-folic acid (TRINSICON / FOLTRIN) capsule Take 1 capsule by mouth every morning. Patient not taking: Reported on 10/13/2016 04/10/16   Rae Roam Barrett, PA-C  oxyCODONE-acetaminophen (PERCOCET/ROXICET) 5-325 MG tablet Take 1-2 tablets q 6 hrs/ prn/ pain Patient not taking: Reported on 10/13/2016 08/08/16   Maeola Harman, MD    Physical Exam: Vitals:   10/14/16 0015 10/14/16 0030 10/14/16 0045 10/14/16 0136  BP: (!) 115/54 121/58 130/60 (!) 139/56  Pulse: 77 77 78 80  Resp:   18 20  Temp:    97.6 F (36.4 C)  TempSrc:    Oral  SpO2: 96% 98% 98% 97%  Weight:    54.7 kg (120 lb 11.2 oz)      Constitutional: Moderately built and nourished. Vitals:   10/14/16 0015 10/14/16 0030 10/14/16 0045 10/14/16 0136  BP: (!) 115/54  121/58 130/60 (!) 139/56  Pulse: 77 77 78 80  Resp:   18 20  Temp:    97.6 F (36.4 C)  TempSrc:    Oral  SpO2: 96% 98% 98% 97%  Weight:    54.7 kg (120 lb 11.2 oz)   Eyes: Anicteric. no pallor. ENMT: No discharge from the ears eyes nose and mouth. Neck: No mass felt, no JVD appreciated. Respiratory: No rhonchi or crepitations. Cardiovascular: S1 and S2 heard. No murmurs appreciated. Abdomen: Right upper quadrant tenderness. No guarding or rigidity. Bowel sounds present. Musculoskeletal: No edema. No joint effusion. Skin: No rash. Skin appears warm. Neurologic: Alert awake oriented to time place and person. Moves all extremities. Psychiatric: Appears normal. Normal affect.   Labs on Admission: I have personally reviewed following labs and imaging studies  CBC:  Recent Labs Lab 10/13/16 1627  WBC 15.7*  HGB 12.0  HCT 37.1  MCV 84.9  PLT 428*   Basic Metabolic Panel:  Recent Labs Lab 10/13/16 1627  NA 134*  K 4.5  CL 97*  CO2 24  GLUCOSE 105*  BUN 27*  CREATININE 1.14*  CALCIUM 9.5   GFR: Estimated Creatinine Clearance:  34.2 mL/min (by C-G formula based on SCr of 1.14 mg/dL (H)). Liver Function Tests:  Recent Labs Lab 10/13/16 1627  AST 25  ALT 10*  ALKPHOS 100  BILITOT 0.3  PROT 6.8  ALBUMIN 3.5    Recent Labs Lab 10/13/16 1627  LIPASE 41   No results for input(s): AMMONIA in the last 168 hours. Coagulation Profile: No results for input(s): INR, PROTIME in the last 168 hours. Cardiac Enzymes: No results for input(s): CKTOTAL, CKMB, CKMBINDEX, TROPONINI in the last 168 hours. BNP (last 3 results) No results for input(s): PROBNP in the last 8760 hours. HbA1C: No results for input(s): HGBA1C in the last 72 hours. CBG: No results for input(s): GLUCAP in the last 168 hours. Lipid Profile: No results for input(s): CHOL, HDL, LDLCALC, TRIG, CHOLHDL, LDLDIRECT in the last 72 hours. Thyroid Function Tests: No results for input(s): TSH, T4TOTAL, FREET4, T3FREE, THYROIDAB in the last 72 hours. Anemia Panel: No results for input(s): VITAMINB12, FOLATE, FERRITIN, TIBC, IRON, RETICCTPCT in the last 72 hours. Urine analysis:    Component Value Date/Time   COLORURINE AMBER (A) 10/13/2016 1619   APPEARANCEUR CLOUDY (A) 10/13/2016 1619   LABSPEC 1.025 10/13/2016 1619   PHURINE 5.0 10/13/2016 1619   GLUCOSEU NEGATIVE 10/13/2016 1619   HGBUR NEGATIVE 10/13/2016 1619   BILIRUBINUR SMALL (A) 10/13/2016 1619   KETONESUR NEGATIVE 10/13/2016 1619   PROTEINUR NEGATIVE 10/13/2016 1619   UROBILINOGEN 0.2 01/17/2014 0933   NITRITE NEGATIVE 10/13/2016 1619   LEUKOCYTESUR SMALL (A) 10/13/2016 1619   Sepsis Labs: @LABRCNTIP (procalcitonin:4,lacticidven:4) )No results found for this or any previous visit (from the past 240 hour(s)).   Radiological Exams on Admission: Ct Abdomen Pelvis W Contrast  Result Date: 10/13/2016 CLINICAL DATA:  Acute onset of generalized abdominal pain, nausea vomiting. Chronic weight loss. Severe focal tenderness at the right hip. Initial encounter. EXAM: CT ABDOMEN AND PELVIS  WITH CONTRAST TECHNIQUE: Multidetector CT imaging of the abdomen and pelvis was performed using the standard protocol following bolus administration of intravenous contrast. CONTRAST:  80mL ISOVUE-300 IOPAMIDOL (ISOVUE-300) INJECTION 61% COMPARISON:  CT of the abdomen and pelvis from 02/19/2016 FINDINGS: Lower chest: The visualized lung bases are grossly clear. Scattered coronary artery calcifications are seen. There is aneurysmal dilatation of the distal descending thoracic aorta to 3.5 cm in AP  dimension, which resolves at the level of the diaphragm. Hepatobiliary: The gallbladder is diffusely inflamed and edematous, with scattered air noted in the gallbladder. An underlying stone is again seen. The appearance is suspicious for emphysematous cholecystitis. Alternatively, the first segment of the duodenum directly abuts the gallbladder, with associated soft tissue inflammation and question of communication. A cholecystoduodenal fistula cannot be excluded. There is underlying edema and congestion at the gallbladder fossa. The liver is otherwise unremarkable. A single tiny focus of air at the left hepatic lobe likely reflects pneumobilia. The common bile duct is grossly unremarkable in appearance. Pancreas: The pancreas is grossly unremarkable in appearance. 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 Spleen: The spleen is unremarkable in appearance. Adrenals/Urinary Tract: The adrenal glands are unremarkable in appearance. Scattered right renal cysts are noted. The kidneys are otherwise unremarkable. There is no evidence of hydronephrosis. No renal or ureteral stones are seen. No perinephric stranding is appreciated. Stomach/Bowel: The stomach is grossly unremarkable in appearance. The visualized small bowel is otherwise grossly unremarkable. The appendix remains normal in caliber. There is no evidence of appendicitis.  Mild diverticulosis is noted along the sigmoid colon, without evidence of diverticulitis. Vascular/Lymphatic: The patient is status post aortobifemoral bypass graft and abdominal aortic aneurysm repair. Underlying diffuse calcification is noted. There is no evidence of recurrent aneurysm. Minimal nonspecific soft tissue inflammation is seen tracking anterior to the right common iliac artery graft. No definite retroperitoneal or pelvic sidewall lymphadenopathy is seen. Reproductive: The bladder is mildly distended and within normal limits. The uterus is grossly unremarkable in appearance. The ovaries are relatively symmetric. No suspicious adnexal masses are seen. Other: Postoperative change is noted along the anterior abdominal wall, and at both lower quadrants. Vague diffuse soft tissue inflammation is noted overlying the right greater femoral trochanter, with trace associated fluid extending from the greater femoral trochanter into the soft tissues, measuring approximately 3.3 x 1.8 x 0.7 cm. This could reflect an infected bursitis. Evolving abscess cannot be entirely excluded. Musculoskeletal: No acute osseous abnormalities are identified. Multilevel vacuum phenomenon is noted along the lower thoracic and lumbar spine. There is grade 2 anterolisthesis of L4 on L5, reflecting underlying facet disease. The visualized musculature is unremarkable in appearance. IMPRESSION: 1. Gallbladder diffusely inflamed and edematous, with scattered air in the gallbladder. Underlying stone seen. The appearance is suspicious for emphysematous cholecystitis. Alternatively, the first segment of the duodenum directly abuts the gallbladder, with associated soft tissue inflammation and question of communication. A cholecystoduodenal fistula cannot be excluded. 2. Underlying edema and congestion at the gallbladder fossa. Tiny focus of suspected pneumobilia noted at the left hepatic lobe. 3. Vague diffuse soft tissue inflammation  overlying the right greater femoral trochanter, with trace associated fluid extending from the greater femoral trochanter into the soft tissues, measuring approximately 3.3 x 1.8 x 0.7 cm. This could reflect an infected bursitis, with surrounding phlegmon. Evolving abscess cannot be entirely excluded. 4. 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 5. Scattered coronary artery calcifications seen. 6. 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 7. Aortobifemoral bypass graft appears grossly patent. Underlying diffuse aortic atherosclerosis. Nonspecific inflammation along the anterior aspect of the right common iliac artery graft. 8. Mild degenerative change at the lower thoracic and lumbar spine.  These results were called by telephone at the time of interpretation on 10/13/2016 at 11:40 pm to Dr. Vanetta MuldersSCOTT ZACKOWSKI, who verbally acknowledged these results. Electronically Signed   By: Roanna RaiderJeffery  Chang M.D.   On: 10/13/2016 23:43     Assessment/Plan Principal Problem:   Acute cholecystitis Active Problems:   HYPERTENSION, BENIGN ESSENTIAL   S/P CABG x 4   Status post AAA (abdominal aortic aneurysm) repair    1. Acute cholecystitis - with possible fistula. Appreciate general surgery consult. Patient has been kept nothing by mouth with IV fluids and on Cipro and I have added Flagyl. Further recommendations per surgery. 2. CAD status post CABG recently in May this year - denies any chest pain. Oral medications on hold in anticipation of possible surgery. I have placed patient on scheduled dose of IV metoprolol since patient takes Coreg. 3. Abdominal aortic aneurysm repair this August - closely follow. 4. Hypertension - on IV metoprolol.   DVT prophylaxis: SCDs. Code Status: Full code.  Family Communication: Discussed with patient.    Disposition Plan: Home.  Consults called: General surgery.  Admission status: Inpatient.    Eduard ClosKAKRAKANDY,ARSHAD N. MD [facility] Pager [phone].  If 7PM-7AM, please contact night-coverage [email or web address] Password [email or web address]  10/14/2016, 3:03 AM

## 2016-10-14 NOTE — Progress Notes (Addendum)
Subjective: History and imaging findings and lab work reviewed She states her abdominal pain has resolved and she feels fine Afebrile.  Heart rate 80.  Normotensive.  SPO2 97% on room air. WBC has gone from 15,000-7.5.  Hemoglobin went from 12.0-9.9. LFTs and lipase normal.  Objective: Vital signs in last 24 hours: Temp:  [97.6 F (36.4 C)-98 F (36.7 C)] 97.6 F (36.4 C) (11/14 0136) Pulse Rate:  [75-95] 80 (11/14 0136) Resp:  [16-20] 20 (11/14 0136) BP: (98-139)/(48-73) 139/56 (11/14 0136) SpO2:  [95 %-99 %] 97 % (11/14 0136) Weight:  [54.7 kg (120 lb 11.2 oz)] 54.7 kg (120 lb 11.2 oz) (11/14 0136) Last BM Date: 10/13/16  Intake/Output from previous day: 11/13 0701 - 11/14 0700 In: 250 [IV Piggyback:250] Out: -  Intake/Output this shift: Total I/O In: 250 [IV Piggyback:250] Out: -   General appearance: Extremely pleasant.  Alert.  Mental status normal.  No distress. Resp: clear to auscultation bilaterally GI: Soft.  Nondistended.  Well-healed midline incision.  Subjectively tender in right upper quadrant but no guarding and no mass.  Lab Results:   Recent Labs  10/13/16 1627 10/14/16 0310  WBC 15.7* 7.5  HGB 12.0 9.9*  HCT 37.1 31.5*  PLT 428* 296   BMET  Recent Labs  10/13/16 1627 10/14/16 0310  NA 134* 135  K 4.5 3.6  CL 97* 105  CO2 24 23  GLUCOSE 105* 100*  BUN 27* 21*  CREATININE 1.14* 0.78  CALCIUM 9.5 8.6*   PT/INR No results for input(s): LABPROT, INR in the last 72 hours. ABG No results for input(s): PHART, HCO3 in the last 72 hours.  Invalid input(s): PCO2, PO2  Studies/Results: Ct Abdomen Pelvis W Contrast  Result Date: 10/13/2016 CLINICAL DATA:  Acute onset of generalized abdominal pain, nausea vomiting. Chronic weight loss. Severe focal tenderness at the right hip. Initial encounter. EXAM: CT ABDOMEN AND PELVIS WITH CONTRAST TECHNIQUE: Multidetector CT imaging of the abdomen and pelvis was performed using the standard protocol  following bolus administration of intravenous contrast. CONTRAST:  80mL ISOVUE-300 IOPAMIDOL (ISOVUE-300) INJECTION 61% COMPARISON:  CT of the abdomen and pelvis from 02/19/2016 FINDINGS: Lower chest: The visualized lung bases are grossly clear. Scattered coronary artery calcifications are seen. 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 Hepatobiliary: The gallbladder is diffusely inflamed and edematous, with scattered air noted in the gallbladder. An underlying stone is again seen. The appearance is suspicious for emphysematous cholecystitis. Alternatively, the first segment of the duodenum directly abuts the gallbladder, with associated soft tissue inflammation and question of communication. A cholecystoduodenal fistula cannot be excluded. There is underlying edema and congestion at the gallbladder fossa. The liver is otherwise unremarkable. A single tiny focus of air at the left hepatic lobe likely reflects pneumobilia. The common bile duct is grossly unremarkable in appearance. Pancreas: The pancreas is grossly unremarkable in appearance. 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 Spleen: The spleen is unremarkable in appearance. Adrenals/Urinary Tract: The adrenal glands are unremarkable in appearance. Scattered right renal cysts are noted. The kidneys are otherwise unremarkable. There is no evidence of hydronephrosis. No renal or ureteral stones are seen. No perinephric stranding is appreciated. Stomach/Bowel: The stomach is grossly unremarkable in appearance. The visualized small bowel is otherwise grossly unremarkable. The appendix remains normal in caliber. There  is no evidence of appendicitis. Mild diverticulosis is noted along the sigmoid colon, without evidence of diverticulitis. Vascular/Lymphatic: The  patient is status post aortobifemoral bypass graft and abdominal aortic aneurysm repair. Underlying diffuse calcification is noted. There is no evidence of recurrent aneurysm. Minimal nonspecific soft tissue inflammation is seen tracking anterior to the right common iliac artery graft. No definite retroperitoneal or pelvic sidewall lymphadenopathy is seen. Reproductive: The bladder is mildly distended and within normal limits. The uterus is grossly unremarkable in appearance. The ovaries are relatively symmetric. No suspicious adnexal masses are seen. Other: Postoperative change is noted along the anterior abdominal wall, and at both lower quadrants. Vague diffuse soft tissue inflammation is noted overlying the right greater femoral trochanter, with trace associated fluid extending from the greater femoral trochanter into the soft tissues, measuring approximately 3.3 x 1.8 x 0.7 cm. This could reflect an infected bursitis. Evolving abscess cannot be entirely excluded. Musculoskeletal: No acute osseous abnormalities are identified. Multilevel vacuum phenomenon is noted along the lower thoracic and lumbar spine. There is grade 2 anterolisthesis of L4 on L5, reflecting underlying facet disease. The visualized musculature is unremarkable in appearance. IMPRESSION: 1. Gallbladder diffusely inflamed and edematous, with scattered air in the gallbladder. Underlying stone seen. The appearance is suspicious for emphysematous cholecystitis. Alternatively, the first segment of the duodenum directly abuts the gallbladder, with associated soft tissue inflammation and question of communication. A cholecystoduodenal fistula cannot be excluded. 2. Underlying edema and congestion at the gallbladder fossa. Tiny focus of suspected pneumobilia noted at the left hepatic lobe. 3. Vague diffuse soft tissue inflammation overlying the right greater femoral trochanter, with trace associated fluid extending from the greater femoral trochanter  into the soft tissues, measuring approximately 3.3 x 1.8 x 0.7 cm. This could reflect an infected bursitis, with surrounding phlegmon. Evolving abscess cannot be entirely excluded. 4. 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 5. Scattered coronary artery calcifications seen. 6. 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 7. Aortobifemoral bypass graft appears grossly patent. Underlying diffuse aortic atherosclerosis. Nonspecific inflammation along the anterior aspect of the right common iliac artery graft. 8. Mild degenerative change at the lower thoracic and lumbar spine. These results were called by telephone at the time of interpretation on 10/13/2016 at 11:40 pm to Dr. Vanetta Mulders, who verbally acknowledged these results. Electronically Signed   By: Roanna Raider M.D.   On: 10/13/2016 23:43    Anti-infectives: Anti-infectives    Start     Dose/Rate Route Frequency Ordered Stop   10/14/16 1000  ciprofloxacin (CIPRO) IVPB 400 mg    Comments:  Cipro 400 mg IV q12h for CrCl > 30 mL/min   400 mg 200 mL/hr over 60 Minutes Intravenous Every 12 hours 10/14/16 0307     10/14/16 0315  metroNIDAZOLE (FLAGYL) IVPB 500 mg     500 mg 100 mL/hr over 60 Minutes Intravenous Every 8 hours 10/14/16 0302     10/14/16 0030  ciprofloxacin (CIPRO) IVPB 400 mg     400 mg 200 mL/hr over 60 Minutes Intravenous  Once 10/14/16 0020 10/14/16 0158      Assessment/Plan: Acute cholecystitis with cholelithiasis . Dramatic symptomatic improvement overnight with antibiotics and bowel rest Some air seen in gallbladder suggesting either emphysematous cholecystitis or cholecystoduodenal fistula  She appears to be a reasonable surgical candidate We'll tentatively plan  for laparoscopic versus open cholecystectomy either late today or, possibly better  tomorrow NPO  Please get cardiology consultation for risk assessment and clearance.  Seems that she is doing very well following CABG.  History open abdominal aortic aneurysm repair August 2017. History CABG May 2017-doing well symptomatically. Hypertension.  On IV metoprolol   LOS: 0 days    Tanny Harnack M 10/14/2016

## 2016-10-14 NOTE — Consult Note (Signed)
Cardiology Consult    Patient ID: Nicole Dean MRN: [id number], DOB/AGE: 03/26/1941   Admit date: 10/13/2016 Date of Consult: 10/14/2016  Primary Physician: [facility] Reason for Consult: Pre-Op Clearance for Cholecystectomy  Primary Cardiologist: Dr. Elease Hashimoto Requesting Provider: Dr. Isidoro Donning  Patient Profile    Nicole Dean is a 75 year old female with a past medical history significant for CAD s/p CABG 03/2016, AAA s/p repair 07/2016, HTN, HLD, former smoker (quit 17 years ago, 52.5 pack year history) who presented with abdominal pain with nausea and vomiting. CT scan of the abdomen/pelvis concerning for emphysematous cholecystitis or cholecystoduodenal fistula. Plan for laparoscopy versus open cholecystectomy. Cardiology consulted for preoperative evaluation and clearance.   History of Present Illness    Nicole Dean reports she has been doing poorly over the past month. Has had little energy and feeling fatigue. Has had abdominal pain for the past month but acutely worsened last night with nausea and vomiting the past 4 days. Some air seen in gallbladder suggesting either emphysematous cholecystitis or cholecystoduodenal fistula.  She lives alone since her husband passed away in 02-09-2023 of this year. She denies any CP or dyspnea with exertion. No syncopal episodes or palpitations. She tells me that she takes care of all of her ADLs. Walks with her dog daily approximately 2 blocks on flat ground at a slow pace without stopping with no issues. The only stairs she climbs are 3 steps in front of her house. She does this without difficulty. Does light housework around the house; washing dishes, cooking and does vacuum. Reports some difficulty with vacuuming. Does not move any furniture to clean. Does not do any yard work.   Past Medical History   Past Medical History:  Diagnosis Date  . AAA (abdominal aortic aneurysm) (HCC)    VVS- following   . Anemia   . Anxiety     . Arthritis    hands  . Coronary artery disease   . Depression   . GERD (gastroesophageal reflux disease)   . History of bronchitis   . History of pneumonia   . Hyperlipidemia   . Hypertension   . Numbness and tingling of right leg   . Shortness of breath dyspnea    sometimes even with resting; pt. denies since heart surgery  . Wears glasses     Past Surgical History:  Procedure Laterality Date  . ABDOMINAL AORTIC ANEURYSM REPAIR N/A 07/22/2016   Procedure: JUXTARENAL  ABDOMINAL AORTIC ANEURYSM REPAIR;  Surgeon: Chuck Hint, MD;  Location: [facility] OR;  Service: Vascular;  Laterality: N/A;  . AORTA - BILATERAL FEMORAL ARTERY BYPASS GRAFT N/A 07/22/2016   Procedure: AORTA BIFEMORAL BYPASS GRAFT;  Surgeon: Chuck Hint, MD;  Location: [facility] OR;  Service: Vascular;  Laterality: N/A;  . CARDIAC CATHETERIZATION N/A 03/31/2016   Procedure: Left Heart Cath and Coronary Angiography;  Surgeon: Peter M Swaziland, MD;  Location: [facility] INVASIVE CV LAB;  Service: Cardiovascular;  Laterality: N/A;  . COLONOSCOPY    . CORONARY ARTERY BYPASS GRAFT N/A 04/04/2016   Procedure: CORONARY ARTERY BYPASS GRAFTING (CABG) x 4 using bilateral internal mammary arteries and right saphenous vein harvested by endovein;  Surgeon: Alleen Borne, MD;  Location: [facility] OR;  Service: Open Heart Surgery;  Laterality: N/A;  . EYE SURGERY Bilateral    cataracts removed./ IOL  . PERIPHERAL VASCULAR CATHETERIZATION N/A 03/03/2016   Procedure: Abdominal Aortogram w/Lower Extremity;  Surgeon: Chuck Hint, MD;  Location:  [facility] INVASIVE CV LAB;  Service: Cardiovascular;  Laterality: N/A;  . REPAIR ILIAC ARTERY Right 07/22/2016   Procedure: RIGHT COMMON ILIAC ANEURYSM REPAIR;  Surgeon: Chuck Hint, MD;  Location: [facility] OR;  Service: Vascular;  Laterality: Right;  . SHOULDER ARTHROSCOPY W/ ROTATOR CUFF REPAIR Right   . TEE WITHOUT CARDIOVERSION N/A 04/04/2016   Procedure: TRANSESOPHAGEAL ECHOCARDIOGRAM (TEE);  Surgeon:  Alleen Borne, MD;  Location: [facility] OR;  Service: Open Heart Surgery;  Laterality: N/A;     Allergies  Allergies  Allergen Reactions  . Codeine Nausea And Vomiting    Severe, ended up in ED  . Penicillins Nausea Only and Other (See Comments)    Has patient had a PCN reaction causing immediate rash, facial/tongue/throat swelling, SOB or lightheadedness with hypotension: Yes Has patient had a PCN reaction causing severe rash involving mucus membranes or skin necrosis: No Has patient had a PCN reaction that required hospitalization No Has patient had a PCN reaction occurring within the last 10 years: No If all of the above answers are "NO", then may proceed with Cephalosporin use.     Inpatient Medications    . sodium chloride   Intravenous STAT  . ciprofloxacin  400 mg Intravenous Q12H  . metoprolol  2.5 mg Intravenous Q8H  . metronidazole  500 mg Intravenous Q8H  . ondansetron  4 mg Intravenous Once    Family History    Family History  Problem Relation Age of Onset  . Cancer Father     stomach    Social History    Social History   Social History  . Marital status: Divorced    Spouse name: N/A  . Number of children: N/A  . Years of education: N/A   Occupational History  . Not on file.   Social History Main Topics  . Smoking status: Former Smoker    Packs/day: 1.50    Years: 35.00    Types: Cigarettes    Quit date: 02/15/1996  . Smokeless tobacco: Never Used  . Alcohol use No  . Drug use: No  . Sexual activity: Not on file   Other Topics Concern  . Not on file   Social History Narrative  . No narrative on file     Review of Systems    General:  No chills, fever, night sweats or weight changes.  Cardiovascular:  No chest pain, dyspnea on exertion, edema, orthopnea, palpitations, paroxysmal nocturnal dyspnea. Dermatological: No rash, lesions/masses Respiratory: No cough, dyspnea Urologic: No hematuria, dysuria Abdominal:   No nausea, vomiting,  diarrhea, bright red blood per rectum, melena, or hematemesis Neurologic:  No visual changes, wkns, changes in mental status. All other systems reviewed and are otherwise negative except as noted above.  Physical Exam    Blood pressure (!) 93/44, pulse 69, temperature 98.2 F (36.8 C), temperature source Oral, resp. rate 20, weight 120 lb 11.2 oz (54.7 kg), SpO2 97 %.  General: Pleasant, NAD Psych: Normal affect. Neuro: Alert and oriented X 3. Moves all extremities spontaneously. HEENT: Normal  Neck: Supple without bruits or JVD. Lungs:  Resp regular and unlabored, CTA. Heart: RRR no s3, s4, or murmurs. Abdomen: Soft, well-healed midline incision. RUQ tenderness to palpation. No guarding or rigidity. BS+ Extremities: No clubbing, cyanosis or edema. DP/PT/Radials 2+ and equal bilaterally.  Labs    Troponin [facility] Test)  Lab Results  Component Value Date   WBC 7.5 10/14/2016   HGB 9.9 (L) 10/14/2016   HCT 31.5 (  L) 10/14/2016   MCV 84.7 10/14/2016   PLT 296 10/14/2016    Recent Labs Lab 10/14/16 0310  NA 135  K 3.6  CL 105  CO2 23  BUN 21*  CREATININE 0.78  CALCIUM 8.6*  PROT 5.6*  BILITOT 0.3  ALKPHOS 74  ALT 8*  AST 19  GLUCOSE 100*   Lab Results  Component Value Date   CHOL 199 09/01/2012   HDL 42 09/01/2012   LDLCALC 97 09/01/2012   TRIG 302 (H) 09/01/2012    Radiology Studies    Ct Abdomen Pelvis W Contrast  Result Date: 10/13/2016 CLINICAL DATA:  Acute onset of generalized abdominal pain, nausea vomiting. Chronic weight loss. Severe focal tenderness at the right hip. Initial encounter. EXAM: CT ABDOMEN AND PELVIS WITH CONTRAST TECHNIQUE: Multidetector CT imaging of the abdomen and pelvis was performed using the standard protocol following bolus administration of intravenous contrast. CONTRAST:  80mL ISOVUE-300 IOPAMIDOL (ISOVUE-300) INJECTION 61% COMPARISON:  CT of the abdomen and pelvis from 02/19/2016 FINDINGS: Lower chest: The visualized lung  bases are grossly clear. Scattered coronary artery calcifications are seen. 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 Hepatobiliary: The gallbladder is diffusely inflamed and edematous, with scattered air noted in the gallbladder. An underlying stone is again seen. The appearance is suspicious for emphysematous cholecystitis. Alternatively, the first segment of the duodenum directly abuts the gallbladder, with associated soft tissue inflammation and question of communication. A cholecystoduodenal fistula cannot be excluded. There is underlying edema and congestion at the gallbladder fossa. The liver is otherwise unremarkable. A single tiny focus of air at the left hepatic lobe likely reflects pneumobilia. The common bile duct is grossly unremarkable in appearance. Pancreas: The pancreas is grossly unremarkable in appearance. 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 Spleen: The spleen is unremarkable in appearance. Adrenals/Urinary Tract: The adrenal glands are unremarkable in appearance. Scattered right renal cysts are noted. The kidneys are otherwise unremarkable. There is no evidence of hydronephrosis. No renal or ureteral stones are seen. No perinephric stranding is appreciated. Stomach/Bowel: The stomach is grossly unremarkable in appearance. The visualized small bowel is otherwise grossly unremarkable. The appendix remains normal in caliber. There is no evidence of appendicitis. Mild diverticulosis is noted along the sigmoid colon, without evidence of diverticulitis. Vascular/Lymphatic: The patient is status post aortobifemoral bypass graft and abdominal aortic aneurysm repair. Underlying diffuse calcification is noted. There is no evidence of recurrent aneurysm. Minimal nonspecific soft tissue inflammation is  seen tracking anterior to the right common iliac artery graft. No definite retroperitoneal or pelvic sidewall lymphadenopathy is seen. Reproductive: The bladder is mildly distended and within normal limits. The uterus is grossly unremarkable in appearance. The ovaries are relatively symmetric. No suspicious adnexal masses are seen. Other: Postoperative change is noted along the anterior abdominal wall, and at both lower quadrants. Vague diffuse soft tissue inflammation is noted overlying the right greater femoral trochanter, with trace associated fluid extending from the greater femoral trochanter into the soft tissues, measuring approximately 3.3 x 1.8 x 0.7 cm. This could reflect an infected bursitis. Evolving abscess cannot be entirely excluded. Musculoskeletal: No acute osseous abnormalities are identified. Multilevel vacuum phenomenon is noted along the lower thoracic and lumbar spine. There is grade 2 anterolisthesis of L4 on L5, reflecting underlying facet disease. The visualized musculature  is unremarkable in appearance. IMPRESSION: 1. Gallbladder diffusely inflamed and edematous, with scattered air in the gallbladder. Underlying stone seen. The appearance is suspicious for emphysematous cholecystitis. Alternatively, the first segment of the duodenum directly abuts the gallbladder, with associated soft tissue inflammation and question of communication. A cholecystoduodenal fistula cannot be excluded. 2. Underlying edema and congestion at the gallbladder fossa. Tiny focus of suspected pneumobilia noted at the left hepatic lobe. 3. Vague diffuse soft tissue inflammation overlying the right greater femoral trochanter, with trace associated fluid extending from the greater femoral trochanter into the soft tissues, measuring approximately 3.3 x 1.8 x 0.7 cm. This could reflect an infected bursitis, with surrounding phlegmon. Evolving abscess cannot be entirely excluded. 4. 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 5. Scattered coronary artery calcifications seen. 6. 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 7. Aortobifemoral bypass graft appears grossly patent. Underlying diffuse aortic atherosclerosis. Nonspecific inflammation along the anterior aspect of the right common iliac artery graft. 8. Mild degenerative change at the lower thoracic and lumbar spine. These results were called by telephone at the time of interpretation on 10/13/2016 at 11:40 pm to Dr. Vanetta MuldersSCOTT ZACKOWSKI, who verbally acknowledged these results. Electronically Signed   By: Roanna RaiderJeffery  Chang M.D.   On: 10/13/2016 23:43   Assessment & Plan    Acute Cholecystitis: Appears to be symptomatically improved since arrival. Plan for laparoscopic verus open cholecystectomy. Asked to evaluate for pre-op clearance.   03/2016:  She had an abnormal Myoview study and subsequent cardiac catheterization revealed significant coronary artery disease. Had right groin pain beginning in October.  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  Right common femoral artery duplex demonstrates no evidence of pseudoaneurysm.   Appears to have moderate functional capacity at baseline. METS appear to be < 4 though difficult to full ascertain. Moderate risk following CABG with bypass of her lesioned vessels. Did well with a high risk procedure in August.  Stress test unlikely to change the course of action. She had no LV dysfunction on her LHC in May. Unable to see her ECHO results. Would get a stat ECHO to ensure no decline in her LV function prior to surgery.  She would like to  precede with surgery.   SignedValentino Nose, Alquan Morrish PGY2 IM Resident [phone]  10/14/2016, 9:36 AM

## 2016-10-14 NOTE — Progress Notes (Signed)
  Echocardiogram 2D Echocardiogram has been performed.  Arvil ChacoFoster, Taylinn Brabant 10/14/2016, 1:52 PM

## 2016-10-14 NOTE — Progress Notes (Signed)
Patient seen and examined, admitted by Dr. Toniann FailKakrakandy this morning  Briefly 75 year old female with history of coronary artery disease s/p CABG in 03/2016, AAA repair in 05/2016, admitted with emphysematous cholecystitis. Surgery consulted.  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 surgery has been consulted, continue NPO, IVF, empiric IV antibiotics - As per recommendations from general surgery I have consulted cardiology for preop clearance for cholecystectomy. Patient is a high risk for surgery given her recent CABG in May 2017 and AAA repair in August 2017.  Will follow closely.    RAI,RIPUDEEP M.D. [facility] 10/14/2016, 9:19 AM  Pager: [phone]

## 2016-10-14 NOTE — Consult Note (Signed)
Reason for Consult: Acute cholecystitis Referring Physician: Fredia Sorrow  Nicole Dean is an 75 y.o. female.  HPI: Nicole Dean has a history of coronary artery disease, status post CABG by Dr. Cyndia Bent in May of this year. 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 Doren Custard in August. She was discharged home in the beginning of September and since that time she has had some abdominal pain. It is diffuse in nature and is always been similar to her postoperative, incisional pain. 4 days ago, she developed severe nausea and vomiting. She has been unable to keep down much except for a little bit of liquids and Ensure. Her daughter is an Chief Strategy Officer here at [employer]. Nicole Dean came to emergency department this evening where further evaluation reveals leukocytosis of 15,700. 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 I was asked to see her in consultation.  Past Medical History:  Diagnosis Date  . AAA (abdominal aortic aneurysm) (HCC)    VVS- following   . Anemia   . Anxiety   . Arthritis    hands  . Coronary artery disease   . Depression   . GERD (gastroesophageal reflux disease)   . History of bronchitis   . History of pneumonia   . Hyperlipidemia   . Hypertension   . Numbness and tingling of right leg   . Shortness of breath dyspnea    sometimes even with resting; pt. denies since heart surgery  . Wears glasses     Past Surgical History:  Procedure Laterality Date  . ABDOMINAL AORTIC ANEURYSM REPAIR N/A 07/22/2016   Procedure: JUXTARENAL  ABDOMINAL AORTIC ANEURYSM REPAIR;  Surgeon: Angelia Mould, MD;  Location: [place];  Service: Vascular;  Laterality: N/A;  . AORTA - BILATERAL FEMORAL ARTERY BYPASS GRAFT N/A 07/22/2016   Procedure: AORTA BIFEMORAL BYPASS GRAFT;  Surgeon:  Angelia Mould, MD;  Location: [place];  Service: Vascular;  Laterality: N/A;  . CARDIAC CATHETERIZATION N/A 03/31/2016   Procedure: Left Heart Cath and Coronary Angiography;  Surgeon: Peter M Martinique, MD;  Location: [place] [facility];  Service: Cardiovascular;  Laterality: N/A;  . COLONOSCOPY    . CORONARY ARTERY BYPASS GRAFT N/A 04/04/2016   Procedure: CORONARY ARTERY BYPASS GRAFTING (CABG) x 4 using bilateral internal mammary arteries and right saphenous vein harvested by endovein;  Surgeon: Gaye Pollack, MD;  Location: [place] [facility];  Service: Open Heart Surgery;  Laterality: N/A;  . EYE SURGERY Bilateral    cataracts removed./ IOL  . PERIPHERAL VASCULAR CATHETERIZATION N/A 03/03/2016   Procedure: Abdominal Aortogram w/Lower Extremity;  Surgeon: Angelia Mould, MD;  Location: [place] [facility];  Service: Cardiovascular;  Laterality: N/A;  . REPAIR ILIAC ARTERY Right 07/22/2016   Procedure: RIGHT COMMON ILIAC ANEURYSM REPAIR;  Surgeon: Angelia Mould, MD;  Location: [place];  Service: Vascular;  Laterality: Right;  . SHOULDER ARTHROSCOPY W/ ROTATOR CUFF REPAIR Right   . TEE WITHOUT CARDIOVERSION N/A 04/04/2016   Procedure: TRANSESOPHAGEAL ECHOCARDIOGRAM (TEE);  Surgeon: Gaye Pollack, MD;  Location: [place];  Service: Open Heart Surgery;  Laterality: N/A;    Family History  Problem Relation Age of Onset  . Cancer Father     stomach    Social History:  reports that she quit smoking about 20 years ago. Her smoking use included Cigarettes. She has a  52.50 pack-year smoking history. She has never used smokeless tobacco. She reports that she does not drink alcohol or use drugs.  Allergies:  Allergies  Allergen Reactions  . Codeine Nausea And Vomiting    Severe, ended up in ED  . Penicillins Nausea Only and Other (See Comments)    Has patient had a PCN reaction causing immediate rash, facial/tongue/throat swelling, SOB or lightheadedness with hypotension: Yes Has patient had a PCN  reaction causing severe rash involving mucus membranes or skin necrosis: No Has patient had a PCN reaction that required hospitalization No Has patient had a PCN reaction occurring within the last 10 years: No If all of the above answers are "NO", then may proceed with Cephalosporin use.     Medications:  Scheduled: . ondansetron  4 mg Intravenous Once   Continuous: . sodium chloride 75 mL/hr at 10/13/16 2110  . ciprofloxacin     PRN:  Results for orders placed or performed during the hospital encounter of 10/13/16 (from the past 48 hour(s))  Urinalysis, Routine w reflex microscopic     Status: Abnormal   Collection Time: 10/13/16  4:19 PM  Result Value Ref Range   Color, Urine AMBER (A) YELLOW    Comment: BIOCHEMICALS MAY BE AFFECTED BY COLOR   APPearance CLOUDY (A) CLEAR   Specific Gravity, Urine 1.025 1.005 - 1.030   pH 5.0 5.0 - 8.0   Glucose, UA NEGATIVE NEGATIVE mg/dL   Hgb urine dipstick NEGATIVE NEGATIVE   Bilirubin Urine SMALL (A) NEGATIVE   Ketones, ur NEGATIVE NEGATIVE mg/dL   Protein, ur NEGATIVE NEGATIVE mg/dL   Nitrite NEGATIVE NEGATIVE   Leukocytes, UA SMALL (A) NEGATIVE  Urine microscopic-add on     Status: Abnormal   Collection Time: 10/13/16  4:19 PM  Result Value Ref Range   Squamous Epithelial / LPF 0-5 (A) NONE SEEN   WBC, UA NONE SEEN 0 - 5 WBC/hpf   RBC / HPF 0-5 0 - 5 RBC/hpf   Bacteria, UA NONE SEEN NONE SEEN   Casts HYALINE CASTS (A) NEGATIVE   Crystals CA OXALATE CRYSTALS (A) NEGATIVE   Urine-Other LESS THAN 10 mL OF URINE SUBMITTED   Lipase, blood     Status: None   Collection Time: 10/13/16  4:27 PM  Result Value Ref Range   Lipase 41 11 - 51 U/L  Comprehensive metabolic panel     Status: Abnormal   Collection Time: 10/13/16  4:27 PM  Result Value Ref Range   Sodium 134 (L) 135 - 145 mmol/L   Potassium 4.5 3.5 - 5.1 mmol/L   Chloride 97 (L) 101 - 111 mmol/L   CO2 24 22 - 32 mmol/L   Glucose, Bld 105 (H) 65 - 99 mg/dL   BUN 27 (H) 6 -  20 mg/dL   Creatinine, Ser 1.14 (H) 0.44 - 1.00 mg/dL   Calcium 9.5 8.9 - 10.3 mg/dL   Total Protein 6.8 6.5 - 8.1 g/dL   Albumin 3.5 3.5 - 5.0 g/dL   AST 25 15 - 41 U/L   ALT 10 (L) 14 - 54 U/L   Alkaline Phosphatase 100 38 - 126 U/L   Total Bilirubin 0.3 0.3 - 1.2 mg/dL   GFR calc non Af Amer 46 (L) >60 mL/min   GFR calc Af Amer 54 (L) >60 mL/min    Comment: (NOTE) The eGFR has been calculated using the CKD EPI equation. This calculation has not been validated in all clinical situations. eGFR's persistently <60  mL/min signify possible Chronic Kidney Disease.    Anion gap 13 5 - 15  CBC     Status: Abnormal   Collection Time: 10/13/16  4:27 PM  Result Value Ref Range   WBC 15.7 (H) 4.0 - 10.5 K/uL   RBC 4.37 3.87 - 5.11 MIL/uL   Hemoglobin 12.0 12.0 - 15.0 g/dL   HCT 37.1 36.0 - 46.0 %   MCV 84.9 78.0 - 100.0 fL   MCH 27.5 26.0 - 34.0 pg   MCHC 32.3 30.0 - 36.0 g/dL   RDW 17.0 (H) 11.5 - 15.5 %   Platelets 428 (H) 150 - 400 K/uL    Ct Abdomen Pelvis W Contrast  Result Date: 10/13/2016 CLINICAL DATA:  Acute onset of generalized abdominal pain, nausea vomiting. Chronic weight loss. Severe focal tenderness at the right hip. Initial encounter. EXAM: CT ABDOMEN AND PELVIS WITH CONTRAST TECHNIQUE: Multidetector CT imaging of the abdomen and pelvis was performed using the standard protocol following bolus administration of intravenous contrast. CONTRAST:  50m ISOVUE-300 IOPAMIDOL (ISOVUE-300) INJECTION 61% COMPARISON:  CT of the abdomen and pelvis from 02/19/2016 FINDINGS: Lower chest: The visualized lung bases are grossly clear. Scattered coronary artery calcifications are seen. 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 Hepatobiliary: The gallbladder is diffusely inflamed and edematous, with scattered air noted in the gallbladder. An underlying stone is again seen. The appearance is  suspicious for emphysematous cholecystitis. Alternatively, the first segment of the duodenum directly abuts the gallbladder, with associated soft tissue inflammation and question of communication. A cholecystoduodenal fistula cannot be excluded. There is underlying edema and congestion at the gallbladder fossa. The liver is otherwise unremarkable. A single tiny focus of air at the left hepatic lobe likely reflects pneumobilia. The common bile duct is grossly unremarkable in appearance. Pancreas: The pancreas is grossly unremarkable in appearance. 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 Spleen: The spleen is unremarkable in appearance. Adrenals/Urinary Tract: The adrenal glands are unremarkable in appearance. Scattered right renal cysts are noted. The kidneys are otherwise unremarkable. There is no evidence of hydronephrosis. No renal or ureteral stones are seen. No perinephric stranding is appreciated. Stomach/Bowel: The stomach is grossly unremarkable in appearance. The visualized small bowel is otherwise grossly unremarkable. The appendix remains normal in caliber. There is no evidence of appendicitis. Mild diverticulosis is noted along the sigmoid colon, without evidence of diverticulitis. Vascular/Lymphatic: The patient is status post aortobifemoral bypass graft and abdominal aortic aneurysm repair. Underlying diffuse calcification is noted. There is no evidence of recurrent aneurysm. Minimal nonspecific soft tissue inflammation is seen tracking anterior to the right common iliac artery graft. No definite retroperitoneal or pelvic sidewall lymphadenopathy is seen. Reproductive: The bladder is mildly distended and within normal limits. The uterus is grossly unremarkable in appearance. The ovaries are relatively symmetric. No suspicious adnexal masses are seen. Other: Postoperative change is noted along the  anterior abdominal wall, and at both lower quadrants. Vague diffuse soft tissue inflammation is noted overlying the right greater femoral trochanter, with trace associated fluid extending from the greater femoral trochanter into the soft tissues, measuring approximately 3.3 x 1.8 x 0.7 cm. This could reflect an infected bursitis. Evolving abscess cannot be entirely excluded. Musculoskeletal: No acute osseous abnormalities are identified. Multilevel vacuum phenomenon is noted along the lower thoracic and  lumbar spine. There is grade 2 anterolisthesis of L4 on L5, reflecting underlying facet disease. The visualized musculature is unremarkable in appearance. IMPRESSION: 1. Gallbladder diffusely inflamed and edematous, with scattered air in the gallbladder. Underlying stone seen. The appearance is suspicious for emphysematous cholecystitis. Alternatively, the first segment of the duodenum directly abuts the gallbladder, with associated soft tissue inflammation and question of communication. A cholecystoduodenal fistula cannot be excluded. 2. Underlying edema and congestion at the gallbladder fossa. Tiny focus of suspected pneumobilia noted at the left hepatic lobe. 3. Vague diffuse soft tissue inflammation overlying the right greater femoral trochanter, with trace associated fluid extending from the greater femoral trochanter into the soft tissues, measuring approximately 3.3 x 1.8 x 0.7 cm. This could reflect an infected bursitis, with surrounding phlegmon. Evolving abscess cannot be entirely excluded. 4. 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 5. Scattered coronary artery calcifications seen. 6. 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 7. Aortobifemoral bypass graft appears grossly patent. Underlying diffuse aortic atherosclerosis. Nonspecific  inflammation along the anterior aspect of the right common iliac artery graft. 8. Mild degenerative change at the lower thoracic and lumbar spine. These results were called by telephone at the time of interpretation on 10/13/2016 at 11:40 pm to Dr. Fredia Sorrow, who verbally acknowledged these results. Electronically Signed   By: Garald Balding M.D.   On: 10/13/2016 23:43    Review of Systems  Constitutional: Positive for malaise/fatigue and weight loss. Negative for fever.  HENT: Negative.   Eyes: Negative.   Respiratory: Negative for cough and shortness of breath.   Cardiovascular: Negative for chest pain.  Gastrointestinal: Positive for abdominal pain, nausea and vomiting.  Genitourinary: Negative.   Musculoskeletal: Negative.   Skin: Negative.   Neurological: Negative.   Endo/Heme/Allergies: Negative.   Psychiatric/Behavioral: Negative.    Blood pressure (!) 110/50, pulse 86, temperature 97.7 F (36.5 C), temperature source Oral, resp. rate 17, SpO2 95 %. Physical Exam  Constitutional: She is oriented to person, place, and time. She appears well-developed and well-nourished. No distress.  HENT:  Head: Normocephalic.  Right Ear: External ear normal.  Left Ear: External ear normal.  Nose: Nose normal.  Mouth/Throat: Oropharynx is clear and moist.  Eyes: EOM are normal.  Neck: Neck supple. No tracheal deviation present.  Cardiovascular: Normal rate and normal heart sounds.   Respiratory: Effort normal and breath sounds normal. No respiratory distress. She has no wheezes. She has no rales.  GI: Soft. She exhibits no distension. There is tenderness. There is no rebound and no guarding.  Healed midline scar, mild tenderness in the right upper quadrant without guarding, no generalized tenderness, no peritonitis, bowel sounds are present  Musculoskeletal: Normal range of motion. She exhibits no edema.  Neurological: She is alert and oriented to person, place, and time. She exhibits  abnormal muscle tone.  Skin: Skin is warm.  Psychiatric: She has a normal mood and affect.    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 Agree with medical admission, IV antibiotics, and bowel rest. I will discuss her case in detail with Dr. Dalbert Batman later this morning we will clarify plans for possible cholecystectomy, laparoscopic versus open. I spoke in detail with her and her daughter regarding this and I answered her questions.  Khambrel Amsden E 10/14/2016, 12:47 AM

## 2016-10-14 NOTE — ED Notes (Signed)
Nicole Dean (Daughter) to be called for updates if any [phone]. Requesting to be called when surgery comes for consult.

## 2016-10-15 ENCOUNTER — Inpatient Hospital Stay (HOSPITAL_COMMUNITY): Payer: Medicare HMO | Admitting: Anesthesiology

## 2016-10-15 ENCOUNTER — Encounter: Payer: Self-pay | Admitting: Vascular Surgery

## 2016-10-15 ENCOUNTER — Encounter (HOSPITAL_COMMUNITY)
Admission: EM | Disposition: A | Discharge status: Home / Self Care | Payer: Self-pay | Source: Home / Self Care | Attending: Internal Medicine

## 2016-10-15 DIAGNOSIS — E43 Unspecified severe protein-calorie malnutrition: Secondary | ICD-10-CM | POA: Insufficient documentation

## 2016-10-15 DIAGNOSIS — K810 Acute cholecystitis: Secondary | ICD-10-CM | POA: Diagnosis present

## 2016-10-15 HISTORY — PX: CHOLECYSTECTOMY, LAPAROSCOPIC: SHX56

## 2016-10-15 HISTORY — PX: CHOLECYSTECTOMY: SHX55

## 2016-10-15 LAB — COMPREHENSIVE METABOLIC PANEL
ALBUMIN: 2.7 g/dL — AB (ref 3.5–5.0)
ALT: 8 U/L — ABNORMAL LOW (ref 14–54)
ANION GAP: 8 (ref 5–15)
AST: 21 U/L (ref 15–41)
Alkaline Phosphatase: 79 U/L (ref 38–126)
BUN: 6 mg/dL (ref 6–20)
CO2: 24 mmol/L (ref 22–32)
Calcium: 8.9 mg/dL (ref 8.9–10.3)
Chloride: 105 mmol/L (ref 101–111)
Creatinine, Ser: 0.46 mg/dL (ref 0.44–1.00)
GFR calc Af Amer: >60 mL/min (ref >60)
GFR calc non Af Amer: >60 mL/min (ref >60)
GLUCOSE: 87 mg/dL (ref 65–99)
POTASSIUM: 3.4 mmol/L — AB (ref 3.5–5.1)
SODIUM: 137 mmol/L (ref 135–145)
Total Bilirubin: 0.5 mg/dL (ref 0.3–1.2)
Total Protein: 5.6 g/dL — ABNORMAL LOW (ref 6.5–8.1)

## 2016-10-15 LAB — BASIC METABOLIC PANEL
Anion gap: 9 (ref 5–15)
BUN: 6 mg/dL (ref 6–20)
CHLORIDE: 105 mmol/L (ref 101–111)
CO2: 22 mmol/L (ref 22–32)
CREATININE: 0.58 mg/dL (ref 0.44–1.00)
Calcium: 8.8 mg/dL — ABNORMAL LOW (ref 8.9–10.3)
GFR calc non Af Amer: >60 mL/min (ref >60)
Glucose, Bld: 181 mg/dL — ABNORMAL HIGH (ref 65–99)
POTASSIUM: 4.2 mmol/L (ref 3.5–5.1)
SODIUM: 136 mmol/L (ref 135–145)

## 2016-10-15 LAB — CBC
HCT: 32.7 % — ABNORMAL LOW (ref 36.0–46.0)
HCT: 35 % — ABNORMAL LOW (ref 36.0–46.0)
HEMOGLOBIN: 11.1 g/dL — AB (ref 12.0–15.0)
Hemoglobin: 10.3 g/dL — ABNORMAL LOW (ref 12.0–15.0)
MCH: 26.6 pg (ref 26.0–34.0)
MCH: 27.3 pg (ref 26.0–34.0)
MCHC: 31.5 g/dL (ref 30.0–36.0)
MCHC: 31.7 g/dL (ref 30.0–36.0)
MCV: 84.5 fL (ref 78.0–100.0)
MCV: 86 fL (ref 78.0–100.0)
PLATELETS: 294 10*3/uL (ref 150–400)
Platelets: 375 10*3/uL (ref 150–400)
RBC: 3.87 MIL/uL (ref 3.87–5.11)
RBC: 4.07 MIL/uL (ref 3.87–5.11)
RDW: 16.8 % — AB (ref 11.5–15.5)
RDW: 16.7 % — ABNORMAL HIGH (ref 11.5–15.5)
WBC: 4.8 10*3/uL (ref 4.0–10.5)
WBC: 19.7 10*3/uL — AB (ref 4.0–10.5)

## 2016-10-15 LAB — SURGICAL PCR SCREEN
MRSA, PCR: NEGATIVE
STAPHYLOCOCCUS AUREUS: NEGATIVE

## 2016-10-15 LAB — PREPARE RBC (CROSSMATCH)

## 2016-10-15 SURGERY — LAPAROSCOPIC CHOLECYSTECTOMY WITH INTRAOPERATIVE CHOLANGIOGRAM
Anesthesia: General | Site: Abdomen

## 2016-10-15 MED ORDER — LACTATED RINGERS IV SOLN
INTRAVENOUS | Status: DC
  Administered 2016-10-15: 18:00:00 via INTRAVENOUS

## 2016-10-15 MED ORDER — LACTATED RINGERS IV SOLN
INTRAVENOUS | Status: DC
  Administered 2016-10-15: 12:00:00 via INTRAVENOUS

## 2016-10-15 MED ORDER — FENTANYL CITRATE (PF) 100 MCG/2ML IJ SOLN
INTRAMUSCULAR | Status: AC
  Filled 2016-10-15: qty 2

## 2016-10-15 MED ORDER — SODIUM CHLORIDE 0.9 % IV SOLN: Freq: Once | INTRAVENOUS | Status: DC

## 2016-10-15 MED ORDER — SUGAMMADEX SODIUM 200 MG/2ML IV SOLN
INTRAVENOUS | Status: DC | PRN
  Administered 2016-10-15: 200 mg via INTRAVENOUS

## 2016-10-15 MED ORDER — HEMOSTATIC AGENTS (NO CHARGE) OPTIME
TOPICAL | Status: DC | PRN
  Administered 2016-10-15: 1 via TOPICAL

## 2016-10-15 MED ORDER — KCL IN DEXTROSE-NACL 40-5-0.9 MEQ/L-%-% IV SOLN
INTRAVENOUS | Status: AC
  Administered 2016-10-15 (×2): via INTRAVENOUS
  Filled 2016-10-15 (×2): qty 1000

## 2016-10-15 MED ORDER — HYDROMORPHONE HCL 2 MG/ML IJ SOLN
INTRAMUSCULAR | Status: AC
  Filled 2016-10-15: qty 1

## 2016-10-15 MED ORDER — METHOCARBAMOL 500 MG PO TABS
500.0000 mg | ORAL_TABLET | Freq: Four times a day (QID) | ORAL | Status: DC | PRN
  Administered 2016-10-18 – 2016-10-19 (×2): 500 mg via ORAL
  Filled 2016-10-15 (×2): qty 1

## 2016-10-15 MED ORDER — BUPIVACAINE HCL (PF) 0.5 % IJ SOLN
INTRAMUSCULAR | Status: DC | PRN
  Administered 2016-10-15: 9 mL

## 2016-10-15 MED ORDER — DEXAMETHASONE SODIUM PHOSPHATE 10 MG/ML IJ SOLN
INTRAMUSCULAR | Status: DC | PRN
  Administered 2016-10-15: 10 mg via INTRAVENOUS

## 2016-10-15 MED ORDER — PROPOFOL 10 MG/ML IV BOLUS
INTRAVENOUS | Status: AC
  Filled 2016-10-15: qty 20

## 2016-10-15 MED ORDER — HYDROMORPHONE HCL 1 MG/ML IJ SOLN
1.0000 mg | INTRAMUSCULAR | Status: DC | PRN
  Administered 2016-10-15 – 2016-10-16 (×4): 1 mg via INTRAVENOUS
  Filled 2016-10-15 (×3): qty 1

## 2016-10-15 MED ORDER — ENOXAPARIN SODIUM 40 MG/0.4ML ~~LOC~~ SOLN
40.0000 mg | SUBCUTANEOUS | Status: DC
  Administered 2016-10-16 – 2016-10-23 (×8): 40 mg via SUBCUTANEOUS
  Filled 2016-10-15 (×8): qty 0.4

## 2016-10-15 MED ORDER — HYDROMORPHONE HCL 1 MG/ML IJ SOLN
INTRAMUSCULAR | Status: AC
  Filled 2016-10-15: qty 0.5

## 2016-10-15 MED ORDER — ROCURONIUM BROMIDE 50 MG/5ML IV SOSY
PREFILLED_SYRINGE | INTRAVENOUS | Status: DC | PRN
  Administered 2016-10-15: 30 mg via INTRAVENOUS

## 2016-10-15 MED ORDER — ONDANSETRON HCL 4 MG/2ML IJ SOLN
INTRAMUSCULAR | Status: DC | PRN
Start: 2016-10-15 — End: 2016-10-15
  Administered 2016-10-15: 4 mg via INTRAVENOUS

## 2016-10-15 MED ORDER — KCL IN DEXTROSE-NACL 20-5-0.45 MEQ/L-%-% IV SOLN
INTRAVENOUS | Status: AC
  Filled 2016-10-15: qty 1000

## 2016-10-15 MED ORDER — ONDANSETRON HCL 4 MG/2ML IJ SOLN
4.0000 mg | Freq: Four times a day (QID) | INTRAMUSCULAR | Status: DC | PRN
  Administered 2016-10-16 – 2016-10-22 (×5): 4 mg via INTRAVENOUS
  Filled 2016-10-15 (×5): qty 2

## 2016-10-15 MED ORDER — CIPROFLOXACIN IN D5W 400 MG/200ML IV SOLN
400.0000 mg | Freq: Two times a day (BID) | INTRAVENOUS | Status: DC
  Administered 2016-10-15 – 2016-10-17 (×4): 400 mg via INTRAVENOUS
  Filled 2016-10-15 (×6): qty 200

## 2016-10-15 MED ORDER — FENTANYL CITRATE (PF) 100 MCG/2ML IJ SOLN
INTRAMUSCULAR | Status: AC
  Filled 2016-10-15: qty 4

## 2016-10-15 MED ORDER — HYDROMORPHONE HCL 1 MG/ML IJ SOLN
INTRAMUSCULAR | Status: AC
  Filled 2016-10-15: qty 1

## 2016-10-15 MED ORDER — SODIUM CHLORIDE 0.9 % IR SOLN
Status: DC | PRN
  Administered 2016-10-15: 1000 mL

## 2016-10-15 MED ORDER — OXYCODONE HCL 5 MG PO TABS
ORAL_TABLET | ORAL | Status: AC
  Filled 2016-10-15: qty 2

## 2016-10-15 MED ORDER — PANTOPRAZOLE SODIUM 40 MG IV SOLR
40.0000 mg | Freq: Every day | INTRAVENOUS | Status: DC
  Administered 2016-10-15: 40 mg via INTRAVENOUS
  Filled 2016-10-15: qty 40

## 2016-10-15 MED ORDER — 0.9 % SODIUM CHLORIDE (POUR BTL) OPTIME
TOPICAL | Status: DC | PRN
  Administered 2016-10-15: 4000 mL

## 2016-10-15 MED ORDER — PROPOFOL 10 MG/ML IV BOLUS
INTRAVENOUS | Status: DC | PRN
  Administered 2016-10-15: 20 mg via INTRAVENOUS
  Administered 2016-10-15: 110 mg via INTRAVENOUS

## 2016-10-15 MED ORDER — FENTANYL CITRATE (PF) 100 MCG/2ML IJ SOLN
INTRAMUSCULAR | Status: DC | PRN
  Administered 2016-10-15: 50 ug via INTRAVENOUS
  Administered 2016-10-15: 100 ug via INTRAVENOUS
  Administered 2016-10-15 (×3): 50 ug via INTRAVENOUS

## 2016-10-15 MED ORDER — BUPIVACAINE HCL (PF) 0.5 % IJ SOLN
INTRAMUSCULAR | Status: AC
  Filled 2016-10-15: qty 30

## 2016-10-15 MED ORDER — METRONIDAZOLE IN NACL 5-0.79 MG/ML-% IV SOLN
500.0000 mg | Freq: Three times a day (TID) | INTRAVENOUS | Status: DC
  Administered 2016-10-15 – 2016-10-22 (×21): 500 mg via INTRAVENOUS
  Filled 2016-10-15 (×23): qty 100

## 2016-10-15 MED ORDER — SENNA 8.6 MG PO TABS
1.0000 | ORAL_TABLET | Freq: Two times a day (BID) | ORAL | Status: DC
  Administered 2016-10-15 – 2016-10-23 (×12): 8.6 mg via ORAL
  Filled 2016-10-15 (×14): qty 1

## 2016-10-15 MED ORDER — HYDROMORPHONE HCL 1 MG/ML IJ SOLN
0.2500 mg | INTRAMUSCULAR | Status: DC | PRN
  Administered 2016-10-15 (×4): 0.5 mg via INTRAVENOUS

## 2016-10-15 MED ORDER — ONDANSETRON 4 MG PO TBDP
4.0000 mg | ORAL_TABLET | Freq: Four times a day (QID) | ORAL | Status: DC | PRN
  Administered 2016-10-19: 4 mg via ORAL
  Filled 2016-10-15: qty 1

## 2016-10-15 MED ORDER — LIDOCAINE HCL (CARDIAC) 20 MG/ML IV SOLN
INTRAVENOUS | Status: DC | PRN
  Administered 2016-10-15: 80 mg via INTRAVENOUS

## 2016-10-15 MED ORDER — PROMETHAZINE HCL 25 MG/ML IJ SOLN
INTRAMUSCULAR | Status: AC
  Filled 2016-10-15: qty 1

## 2016-10-15 MED ORDER — PHENYLEPHRINE HCL 10 MG/ML IJ SOLN
INTRAMUSCULAR | Status: DC | PRN
  Administered 2016-10-15 (×2): 80 ug via INTRAVENOUS

## 2016-10-15 MED ORDER — OXYCODONE HCL 5 MG PO TABS
5.0000 mg | ORAL_TABLET | ORAL | Status: DC | PRN
  Administered 2016-10-15 – 2016-10-23 (×35): 10 mg via ORAL
  Filled 2016-10-15 (×35): qty 2

## 2016-10-15 MED ORDER — SUCCINYLCHOLINE CHLORIDE 200 MG/10ML IV SOSY
PREFILLED_SYRINGE | INTRAVENOUS | Status: DC | PRN
  Administered 2016-10-15: 80 mg via INTRAVENOUS

## 2016-10-15 MED ORDER — ESMOLOL HCL 100 MG/10ML IV SOLN
INTRAVENOUS | Status: DC | PRN
  Administered 2016-10-15: 20 mg via INTRAVENOUS

## 2016-10-15 MED ORDER — IOPAMIDOL (ISOVUE-300) INJECTION 61%
INTRAVENOUS | Status: AC
  Filled 2016-10-15: qty 50

## 2016-10-15 MED ORDER — PROMETHAZINE HCL 25 MG/ML IJ SOLN
6.2500 mg | INTRAMUSCULAR | Status: DC | PRN
  Administered 2016-10-15: 6.25 mg via INTRAVENOUS

## 2016-10-15 SURGICAL SUPPLY — 62 items
APPLIER CLIP ROT 10 11.4 M/L (STAPLE)
BLADE SURG 10 STRL SS (BLADE) ×3 IMPLANT
BLADE SURG ROTATE 9660 (MISCELLANEOUS) IMPLANT
CANISTER SUCTION 2500CC (MISCELLANEOUS) ×3 IMPLANT
CHLORAPREP W/TINT 26ML (MISCELLANEOUS) ×3 IMPLANT
CLIP APPLIE ROT 10 11.4 M/L (STAPLE) IMPLANT
COVER MAYO STAND STRL (DRAPES) ×3 IMPLANT
COVER SURGICAL LIGHT HANDLE (MISCELLANEOUS) ×3 IMPLANT
DERMABOND ADVANCED (GAUZE/BANDAGES/DRESSINGS) ×2
DERMABOND ADVANCED .7 DNX12 (GAUZE/BANDAGES/DRESSINGS) ×1 IMPLANT
DRAIN CHANNEL 19F RND (DRAIN) ×3 IMPLANT
DRAPE C-ARM 42X72 X-RAY (DRAPES) IMPLANT
DRSG TEGADERM 2-3/8X2-3/4 SM (GAUZE/BANDAGES/DRESSINGS) ×6 IMPLANT
DRSG TELFA 3X8 NADH (GAUZE/BANDAGES/DRESSINGS) ×3 IMPLANT
ELECT BLADE 4.0 EZ CLEAN MEGAD (MISCELLANEOUS) ×6
ELECT REM PT RETURN 9FT ADLT (ELECTROSURGICAL) ×3
ELECTRODE BLDE 4.0 EZ CLN MEGD (MISCELLANEOUS) ×2 IMPLANT
ELECTRODE REM PT RTRN 9FT ADLT (ELECTROSURGICAL) ×1 IMPLANT
EVACUATOR SILICONE 100CC (DRAIN) ×3 IMPLANT
GAUZE SPONGE 4X4 12PLY STRL (GAUZE/BANDAGES/DRESSINGS) ×3 IMPLANT
GAUZE SPONGE 4X4 16PLY XRAY LF (GAUZE/BANDAGES/DRESSINGS) ×3 IMPLANT
GLOVE BIOGEL M 7.0 STRL (GLOVE) ×3 IMPLANT
GLOVE BIOGEL PI IND STRL 8 (GLOVE) ×1 IMPLANT
GLOVE BIOGEL PI INDICATOR 8 (GLOVE) ×2
GLOVE ECLIPSE 7.0 STRL STRAW (GLOVE) ×3 IMPLANT
GLOVE ECLIPSE 7.5 STRL STRAW (GLOVE) ×3 IMPLANT
GLOVE EUDERMIC 7 POWDERFREE (GLOVE) ×6 IMPLANT
GLOVE INDICATOR 7.0 STRL GRN (GLOVE) ×3 IMPLANT
GOWN STRL REUS W/ TWL LRG LVL3 (GOWN DISPOSABLE) ×2 IMPLANT
GOWN STRL REUS W/ TWL XL LVL3 (GOWN DISPOSABLE) ×1 IMPLANT
GOWN STRL REUS W/TWL LRG LVL3 (GOWN DISPOSABLE) ×4
GOWN STRL REUS W/TWL XL LVL3 (GOWN DISPOSABLE) ×2
HEMOSTAT SNOW SURGICEL 2X4 (HEMOSTASIS) ×3 IMPLANT
KIT BASIN OR (CUSTOM PROCEDURE TRAY) ×3 IMPLANT
KIT ROOM TURNOVER OR (KITS) ×3 IMPLANT
NS IRRIG 1000ML POUR BTL (IV SOLUTION) ×3 IMPLANT
PAD ARMBOARD 7.5X6 YLW CONV (MISCELLANEOUS) ×3 IMPLANT
PENCIL BUTTON HOLSTER BLD 10FT (ELECTRODE) ×3 IMPLANT
POUCH SPECIMEN RETRIEVAL 10MM (ENDOMECHANICALS) IMPLANT
SCISSORS LAP 5X35 DISP (ENDOMECHANICALS) ×6 IMPLANT
SET CHOLANGIOGRAPH 5 50 .035 (SET/KITS/TRAYS/PACK) IMPLANT
SET IRRIG TUBING LAPAROSCOPIC (IRRIGATION / IRRIGATOR) ×3 IMPLANT
SLEEVE ENDOPATH XCEL 5M (ENDOMECHANICALS) ×15 IMPLANT
SPECIMEN JAR SMALL (MISCELLANEOUS) ×3 IMPLANT
SPONGE INTESTINAL PEANUT (DISPOSABLE) ×3 IMPLANT
SPONGE LAP 18X18 X RAY DECT (DISPOSABLE) ×3 IMPLANT
SUT ETHILON 2 0 FS 18 (SUTURE) ×3 IMPLANT
SUT MNCRL AB 4-0 PS2 18 (SUTURE) IMPLANT
SUT PDS AB 1 CT  36 (SUTURE) ×4
SUT PDS AB 1 CT 36 (SUTURE) ×2 IMPLANT
SUT VICRYL AB 2 0 TIES (SUTURE) ×3 IMPLANT
SWAB CULTURE LIQ STUART DBL (MISCELLANEOUS) ×3 IMPLANT
SWAB CULTURE LIQUID MINI MALE (MISCELLANEOUS) ×3 IMPLANT
TOWEL OR 17X24 6PK STRL BLUE (TOWEL DISPOSABLE) ×3 IMPLANT
TOWEL OR 17X26 10 PK STRL BLUE (TOWEL DISPOSABLE) ×3 IMPLANT
TRAY LAPAROSCOPIC MC (CUSTOM PROCEDURE TRAY) ×3 IMPLANT
TROCAR XCEL BLUNT TIP 100MML (ENDOMECHANICALS) ×3 IMPLANT
TROCAR XCEL NON-BLD 5MMX100MML (ENDOMECHANICALS) ×3 IMPLANT
TUBE CONNECTING 12'X1/4 (SUCTIONS) ×1
TUBE CONNECTING 12X1/4 (SUCTIONS) ×2 IMPLANT
TUBING INSUFFLATION (TUBING) ×3 IMPLANT
YANKAUER SUCT BULB TIP NO VENT (SUCTIONS) ×3 IMPLANT

## 2016-10-15 NOTE — Progress Notes (Signed)
Subjective: Stable and alert Still has right upper quadrant pain but no nausea or vomiting Appreciate cardiology evaluation and preop risk assessment Remains afebrile CBC 4800.  Hemoglobin 10.3.  Potassium 3.4.  LFTs normal. 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   Objective: Vital signs in last 24 hours: Temp:  [98.2 F (36.8 C)-98.6 F (37 C)] 98.6 F (37 C) (11/15 0349) Pulse Rate:  [69-87] 87 (11/15 0349) Resp:  [17-20] 17 (11/14 1254) BP: (92-136)/(40-60) 136/60 (11/15 0349) SpO2:  [95 %-97 %] 95 % (11/15 0349) Last BM Date: 10/13/16  Intake/Output from previous day: 11/14 0701 - 11/15 0700 In: 1462.5 [I.V.:862.5; IV Piggyback:600] Out: -  Intake/Output this shift: Total I/O In: 300 [IV Piggyback:300] Out: -   General appearance: alert,  and Pleasant.  Minimal distress. Resp: clear to auscultation bilaterally GI: Soft left side.  Tender right upper quadrant.  No mass.  Old midline incision well-healed.  Lab Results:   Recent Labs  10/14/16 0310 10/15/16 0330  WBC 7.5 4.8  HGB 9.9* 10.3*  HCT 31.5* 32.7*  PLT 296 294   BMET  Recent Labs  10/14/16 0310 10/15/16 0330  NA 135 137  K 3.6 3.4*  CL 105 105  CO2 23 24  GLUCOSE 100* 87  BUN 21* 6  CREATININE 0.78 0.46  CALCIUM 8.6* 8.9   PT/INR No results for input(s): LABPROT, INR in the last 72 hours. ABG No results for input(s): PHART, HCO3 in the last 72 hours.  Invalid input(s): PCO2, PO2  Studies/Results: Ct Abdomen Pelvis W Contrast  Result Date: 10/13/2016 CLINICAL DATA:  Acute onset of generalized abdominal pain, nausea vomiting. Chronic weight loss. Severe focal tenderness at the right hip. Initial encounter. EXAM: CT ABDOMEN AND PELVIS WITH CONTRAST TECHNIQUE: Multidetector CT imaging of the abdomen and pelvis was performed using the standard protocol following bolus administration of intravenous contrast. CONTRAST:  80mL ISOVUE-300 IOPAMIDOL  (ISOVUE-300) INJECTION 61% COMPARISON:  CT of the abdomen and pelvis from 02/19/2016 FINDINGS: Lower chest: The visualized lung bases are grossly clear. Scattered coronary artery calcifications are seen. 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 Hepatobiliary: The gallbladder is diffusely inflamed and edematous, with scattered air noted in the gallbladder. An underlying stone is again seen. The appearance is suspicious for emphysematous cholecystitis. Alternatively, the first segment of the duodenum directly abuts the gallbladder, with associated soft tissue inflammation and question of communication. A cholecystoduodenal fistula cannot be excluded. There is underlying edema and congestion at the gallbladder fossa. The liver is otherwise unremarkable. A single tiny focus of air at the left hepatic lobe likely reflects pneumobilia. The common bile duct is grossly unremarkable in appearance. Pancreas: The pancreas is grossly unremarkable in appearance. 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 Spleen: The spleen is unremarkable in appearance. Adrenals/Urinary Tract: The adrenal glands are unremarkable in appearance. Scattered right renal cysts are noted. The kidneys are otherwise unremarkable. There is no evidence of hydronephrosis. No renal or ureteral stones are seen. No perinephric stranding is appreciated. Stomach/Bowel: The stomach is grossly unremarkable in appearance. The visualized small bowel is otherwise grossly unremarkable. The appendix remains normal in caliber. There is no evidence of appendicitis. Mild diverticulosis is noted along the sigmoid colon, without evidence of diverticulitis. Vascular/Lymphatic: The patient is status post aortobifemoral bypass graft  and abdominal aortic aneurysm repair.  Underlying diffuse calcification is noted. There is no evidence of recurrent aneurysm. Minimal nonspecific soft tissue inflammation is seen tracking anterior to the right common iliac artery graft. No definite retroperitoneal or pelvic sidewall lymphadenopathy is seen. Reproductive: The bladder is mildly distended and within normal limits. The uterus is grossly unremarkable in appearance. The ovaries are relatively symmetric. No suspicious adnexal masses are seen. Other: Postoperative change is noted along the anterior abdominal wall, and at both lower quadrants. Vague diffuse soft tissue inflammation is noted overlying the right greater femoral trochanter, with trace associated fluid extending from the greater femoral trochanter into the soft tissues, measuring approximately 3.3 x 1.8 x 0.7 cm. This could reflect an infected bursitis. Evolving abscess cannot be entirely excluded. Musculoskeletal: No acute osseous abnormalities are identified. Multilevel vacuum phenomenon is noted along the lower thoracic and lumbar spine. There is grade 2 anterolisthesis of L4 on L5, reflecting underlying facet disease. The visualized musculature is unremarkable in appearance. IMPRESSION: 1. Gallbladder diffusely inflamed and edematous, with scattered air in the gallbladder. Underlying stone seen. The appearance is suspicious for emphysematous cholecystitis. Alternatively, the first segment of the duodenum directly abuts the gallbladder, with associated soft tissue inflammation and question of communication. A cholecystoduodenal fistula cannot be excluded. 2. Underlying edema and congestion at the gallbladder fossa. Tiny focus of suspected pneumobilia noted at the left hepatic lobe. 3. Vague diffuse soft tissue inflammation overlying the right greater femoral trochanter, with trace associated fluid extending from the greater femoral trochanter into the soft tissues, measuring approximately 3.3 x 1.8 x 0.7 cm. This could reflect an  infected bursitis, with surrounding phlegmon. Evolving abscess cannot be entirely excluded. 4. 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 5. Scattered coronary artery calcifications seen. 6. 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 7. Aortobifemoral bypass graft appears grossly patent. Underlying diffuse aortic atherosclerosis. Nonspecific inflammation along the anterior aspect of the right common iliac artery graft. 8. Mild degenerative change at the lower thoracic and lumbar spine. These results were called by telephone at the time of interpretation on 10/13/2016 at 11:40 pm to Dr. Vanetta MuldersSCOTT ZACKOWSKI, who verbally acknowledged these results. Electronically Signed   By: Roanna RaiderJeffery  Chang M.D.   On: 10/13/2016 23:43    Anti-infectives: Anti-infectives    Start     Dose/Rate Route Frequency Ordered Stop   10/14/16 1000  ciprofloxacin (CIPRO) IVPB 400 mg    Comments:  Cipro 400 mg IV q12h for CrCl > 30 mL/min   400 mg 200 mL/hr over 60 Minutes Intravenous Every 12 hours 10/14/16 0307     10/14/16 0315  metroNIDAZOLE (FLAGYL) IVPB 500 mg     500 mg 100 mL/hr over 60 Minutes Intravenous Every 8 hours 10/14/16 0302     10/14/16 0030  ciprofloxacin (CIPRO) IVPB 400 mg     400 mg 200 mL/hr over 60 Minutes Intravenous  Once 10/14/16 0020 10/14/16 1035      Assessment/Plan: s/p Procedure(s): LAPAROSCOPIC VS OPEN CHOLECYSTECTOMY WITH INTRAOPERATIVE CHOLANGIOGRAM, POSSIBLE CHOLECYSTODUODENAL FISTULA REPAIR   Acute cholecystitis with cholelithiasis. Remains stable and symptoms improved but still tender. Best management option for biliary tract disease is to proceed with surgery today. Some air seen in gallbladder suggesting either emphysematous cholecystitis or cholecystoduodenal fistula  She appears to be a reasonable surgical  candidate We'll tentatively  plan for laparoscopic versus open cholecystectomy today. I told her that she may have a cholecystoduodenal fistula or she may not.  I have told her that that we might give to the surgery laparoscopically but high risk of being converted to open due to complexly I've explained the indications, details, techniques, and numerous risk of the surgery with her.  She's aware the risk of bleeding, infection, injury to adjacent organs, conversion to open laparotomy, bile leak, cardiac pulmonary and thromboembolic problems.  She understands all these issues well.  At this time all of her questions are answered.  She agrees with this plan.  Marland Kitchen  History open abdominal aortic aneurysm repair August 2017. History CABG May 2017-doing well symptomatically.  Acceptable cardiac risk for proposed procedure. Hypertension.  On IV metoprolol   LOS: 1 day    Dean Wonder M 10/15/2016

## 2016-10-15 NOTE — Progress Notes (Signed)
Initial Nutrition Assessment  DOCUMENTATION CODES:   Severe malnutrition in context of acute illness/injury  INTERVENTION:    Advance diet as medically appropriate, add supplements when/as able  NUTRITION DIAGNOSIS:   Malnutrition related to acute illness as evidenced by percent weight loss, energy intake < or equal to 50% for > or equal to 5 days  GOAL:   Patient will meet greater than or equal to 90% of their needs  MONITOR:   Diet advancement, PO intake, Supplement acceptance, Labs, Weight trends, I & O's  REASON FOR ASSESSMENT:   Consult, Malnutrition Screening Tool Poor PO  ASSESSMENT:   75 y.o. Female with history of CAD status post CABG in May of this year, abdominal aneurysm repair in August of this year following which patient has been having persistent abdominal pain presents to the ER because of acute worsening of abdominal pain mostly in the right upper quadrant with nausea vomiting last 4 days.   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  RD spoke with pt prior to OR departure. Reports a poor appetite x 1 week PTA. Also reveals a 20 lb weight loss in the last 3 months (severe for time frame). She was trying to drink Boost and/or Ensure supplements.  UBW is usually 138 lbs.  Nutrition focused physical exam completed.  No muscle or subcutaneous fat depletion noticed.  Diet Order:  Diet NPO time specified  Skin:  Reviewed, no issues  Last BM:  11/13  Height:   Ht Readings from Last 1 Encounters:  10/14/16 5\' 2"  (1.575 m)    Weight:   Wt Readings from Last 1 Encounters:  10/14/16 120 lb 11.2 oz (54.7 kg)    Ideal Body Weight:  50 kg  BMI:  Body mass index is 22.08 kg/m.  Estimated Nutritional Needs:   Kcal:  1450-1650  Protein:  70-80 gm  Fluid:  >/= 1.5 L  EDUCATION NEEDS:   No education needs identified at this time  Maureen ChattersKatie Da Authement, RD, LDN Pager #: [phone] After-Hours Pager  #: [phone]

## 2016-10-15 NOTE — Anesthesia Procedure Notes (Signed)
Procedure Name: Intubation Date/Time: 10/15/2016 1:03 PM Performed by: Jeani HawkingBERRY, Kila Godina J Pre-anesthesia Checklist: Patient identified, Suction available, Emergency Drugs available, Timeout performed and Patient being monitored Patient Re-evaluated:Patient Re-evaluated prior to inductionOxygen Delivery Method: Circle system utilized Preoxygenation: Pre-oxygenation with 100% oxygen Intubation Type: IV induction and Rapid sequence Laryngoscope Size: Mac and 3 Tube type: Oral Tube size: 7.0 mm Number of attempts: 1 Airway Equipment and Method: Stylet Placement Confirmation: ETT inserted through vocal cords under direct vision,  positive ETCO2,  CO2 detector and breath sounds checked- equal and bilateral Secured at: 21 cm Tube secured with: Tape Dental Injury: Teeth and Oropharynx as per pre-operative assessment

## 2016-10-15 NOTE — Anesthesia Postprocedure Evaluation (Signed)
Anesthesia Post Note  Patient: Nicole GivensJanice D Dean  Procedure(s) Performed: Procedure(s) (LRB): LAPAROSCOPIC CONVERTED TO OPEN CHOLECYSTECTOMY (N/A)  Patient location during evaluation: PACU Level of consciousness: awake, awake and alert and oriented Pain management: pain level controlled Vital Signs Assessment: post-procedure vital signs reviewed and stable Respiratory status: spontaneous breathing, nonlabored ventilation and respiratory function stable Cardiovascular status: blood pressure returned to baseline Anesthetic complications: no    Last Vitals:  Vitals:   10/15/16 2045 10/15/16 2113  BP: 128/76   Pulse: (!) 122 (!) 115  Resp: (!) 24 12  Temp:      Last Pain:  Vitals:   10/15/16 2113  TempSrc:   PainSc: 10-Worst pain ever                 Carlye Panameno COKER

## 2016-10-15 NOTE — Transfer of Care (Signed)
Immediate Anesthesia Transfer of Care Note  Patient: Nicole GivensJanice D Tritz  Procedure(s) Performed: Procedure(s): LAPAROSCOPIC CONVERTED TO OPEN CHOLECYSTECTOMY (N/A)  Patient Location: PACU  Anesthesia Type:General  Level of Consciousness: awake, alert , oriented and patient cooperative  Airway & Oxygen Therapy: Patient Spontanous Breathing  Post-op Assessment: Report given to RN and Post -op Vital signs reviewed and stable  Post vital signs: Reviewed and stable  Last Vitals:  Vitals:   10/15/16 0349 10/15/16 1445  BP: 136/60   Pulse: 87   Resp:    Temp: 37 C 36.2 C    Last Pain:  Vitals:   10/15/16 0825  TempSrc:   PainSc: 8          Complications: No apparent anesthesia complications

## 2016-10-15 NOTE — Progress Notes (Addendum)
PROGRESS NOTE  Nicole Dean  ZOX:[id number] DOB: 1941-11-12  DOA: 10/13/2016 PCP: [facility]   Brief Narrative:  75 year old female with a PMH of CAD status post CABG May 2017, AAA repair August 2017, anxiety, depression, GERD, HLD, HTN, presented to ED with worsening RUQ abdominal pain of 4 days' duration. CT abdomen and pelvis done in ED showed features concerning for cholecystitis with possible fistula. General surgery and cardiology were consulted. Cardiology has cleared patient for surgery and general surgery plans cholecystectomy 11/15.   Assessment & Plan:   Principal Problem:   Acute cholecystitis Active Problems:   HYPERTENSION, BENIGN ESSENTIAL   S/P CABG x 4   Status post AAA (abdominal aortic aneurysm) repair   Generalized abdominal pain   1. Acute cholecystitis with cholelithiasis: General surgery consultation and follow-up appreciated. Cardiology input appreciated and have cleared her for cholecystectomy. As per general surgery, patient may have either emphysematous cholecystitis or cholecystoduodenal fistula. Surgery plans laparoscopic versus open cholecystectomy on 11/15. 2. CAD status post CABG: No chest pain reported. Cardiology input appreciated and have cleared her for surgery. Resume oral beta blockers post procedure. 3. Status post AAA repair August 2017 4. Essential hypertension: Controlled. 5. Hypokalemia: Replace in IV fluids and follow BMP in a.m. 6. Anemia: Follow CBC postop, in a.m.   DVT prophylaxis: SCDs Code Status: Full Family Communication: None at bedside Disposition Plan: DC home when medically improved.   Consultants:   General surgery  Cardiology   Procedures:   None  Antimicrobials:   IV Cipro 11/12 >  IV metronidazole 11/12 >    Subjective: Seen this morning prior to procedure. Complained of right upper quadrant abdominal pain but better than on admission. No nausea or vomiting. Appeared  slightly anxious and nervous regarding the procedure and was reassured.  Objective:  Vitals:   10/14/16 0530 10/14/16 0800 10/14/16 1254 10/15/16 0349  BP: (!) 93/44  (!) 92/40 136/60  Pulse: 69  85 87  Resp: 20  17   Temp: 98.2 F (36.8 C)  98.5 F (36.9 C) 98.6 F (37 C)  TempSrc: Oral  Oral Oral  SpO2: 97%  95% 95%  Weight:      Height:  5\' 2"  (1.575 m)      Intake/Output Summary (Last 24 hours) at 10/15/16 1332 Last data filed at 10/14/16 2211  Gross per 24 hour  Intake            812.5 ml  Output                0 ml  Net            812.5 ml   Filed Weights   10/14/16 0136  Weight: 54.7 kg (120 lb 11.2 oz)    Examination:  General exam: Pleasant elderly female sitting up comfortably at edge of bed. Respiratory system: Clear to auscultation. Respiratory effort normal. Cardiovascular system: S1 & S2 heard, RRR. No JVD, murmurs, rubs, gallops or clicks. No pedal edema. Telemetry: Sinus rhythm. Gastrointestinal system: Abdomen is nondistended, soft and RUQ tenderness without peritoneal signs. No organomegaly or masses felt. Normal bowel sounds heard. Central nervous system: Alert and oriented. No focal neurological deficits. Extremities: Symmetric 5 x 5 power. Skin: No rashes, lesions or ulcers Psychiatry: Judgement and insight appear normal. Mood & affect appropriate.     Data Reviewed: I have personally reviewed following labs and imaging studies  CBC:  Recent Labs Lab 10/13/16 1627 10/14/16 0310 10/15/16 0330  WBC 15.7* 7.5 4.8  NEUTROABS  --  4.6  --   HGB 12.0 9.9* 10.3*  HCT 37.1 31.5* 32.7*  MCV 84.9 84.7 84.5  PLT 428* 296 294   Basic Metabolic Panel:  Recent Labs Lab 10/13/16 1627 10/14/16 0310 10/15/16 0330  NA 134* 135 137  K 4.5 3.6 3.4*  CL 97* 105 105  CO2 24 23 24   GLUCOSE 105* 100* 87  BUN 27* 21* 6  CREATININE 1.14* 0.78 0.46  CALCIUM 9.5 8.6* 8.9   GFR: Estimated Creatinine Clearance: 48.8 mL/min (by C-G formula based on  SCr of 0.46 mg/dL). Liver Function Tests:  Recent Labs Lab 10/13/16 1627 10/14/16 0310 10/15/16 0330  AST 25 19 21   ALT 10* 8* 8*  ALKPHOS 100 74 79  BILITOT 0.3 0.3 0.5  PROT 6.8 5.6* 5.6*  ALBUMIN 3.5 2.6* 2.7*    Recent Labs Lab 10/13/16 1627  LIPASE 41   No results for input(s): AMMONIA in the last 168 hours. Coagulation Profile: No results for input(s): INR, PROTIME in the last 168 hours. Cardiac Enzymes: No results for input(s): CKTOTAL, CKMB, CKMBINDEX, TROPONINI in the last 168 hours. BNP (last 3 results) No results for input(s): PROBNP in the last 8760 hours. HbA1C: No results for input(s): HGBA1C in the last 72 hours. CBG: No results for input(s): GLUCAP in the last 168 hours. Lipid Profile: No results for input(s): CHOL, HDL, LDLCALC, TRIG, CHOLHDL, LDLDIRECT in the last 72 hours. Thyroid Function Tests: No results for input(s): TSH, T4TOTAL, FREET4, T3FREE, THYROIDAB in the last 72 hours. Anemia Panel: No results for input(s): VITAMINB12, FOLATE, FERRITIN, TIBC, IRON, RETICCTPCT in the last 72 hours.  Sepsis Labs: No results for input(s): PROCALCITON, LATICACIDVEN in the last 168 hours.  Recent Results (from the past 240 hour(s))  Surgical PCR screen     Status: None   Collection Time: 10/15/16  4:35 AM  Result Value Ref Range Status   MRSA, PCR NEGATIVE NEGATIVE Final   Staphylococcus aureus NEGATIVE NEGATIVE Final    Comment:        The Xpert SA Assay (FDA approved for NASAL specimens in patients over 74 years of age), is one component of a comprehensive surveillance program.  Test performance has been validated by [facility] for patients greater than or equal to 74 year old. It is not intended to diagnose infection nor to guide or monitor treatment.          Radiology Studies: Ct Abdomen Pelvis W Contrast  Result Date: 10/13/2016 CLINICAL DATA:  Acute onset of generalized abdominal pain, nausea vomiting. Chronic weight loss.  Severe focal tenderness at the right hip. Initial encounter. EXAM: CT ABDOMEN AND PELVIS WITH CONTRAST TECHNIQUE: Multidetector CT imaging of the abdomen and pelvis was performed using the standard protocol following bolus administration of intravenous contrast. CONTRAST:  80mL ISOVUE-300 IOPAMIDOL (ISOVUE-300) INJECTION 61% COMPARISON:  CT of the abdomen and pelvis from 02/19/2016 FINDINGS: Lower chest: The visualized lung bases are grossly clear. Scattered coronary artery calcifications are seen. 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 Hepatobiliary: The gallbladder is diffusely inflamed and edematous, with scattered air noted in the gallbladder. An underlying stone is again seen. The appearance is suspicious for emphysematous cholecystitis. Alternatively, the first segment of the duodenum directly abuts the gallbladder, with associated soft tissue inflammation and question of communication. A cholecystoduodenal fistula cannot be excluded. There is  underlying edema and congestion at the gallbladder fossa. The liver is otherwise unremarkable. A single tiny focus of air at the left hepatic lobe likely reflects pneumobilia. The common bile duct is grossly unremarkable in appearance. Pancreas: The pancreas is grossly unremarkable in appearance. 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 Spleen: The spleen is unremarkable in appearance. Adrenals/Urinary Tract: The adrenal glands are unremarkable in appearance. Scattered right renal cysts are noted. The kidneys are otherwise unremarkable. There is no evidence of hydronephrosis. No renal or ureteral stones are seen. No perinephric stranding is appreciated. Stomach/Bowel: The stomach is grossly unremarkable in appearance. The visualized small bowel is otherwise grossly  unremarkable. The appendix remains normal in caliber. There is no evidence of appendicitis. Mild diverticulosis is noted along the sigmoid colon, without evidence of diverticulitis. Vascular/Lymphatic: The patient is status post aortobifemoral bypass graft and abdominal aortic aneurysm repair. Underlying diffuse calcification is noted. There is no evidence of recurrent aneurysm. Minimal nonspecific soft tissue inflammation is seen tracking anterior to the right common iliac artery graft. No definite retroperitoneal or pelvic sidewall lymphadenopathy is seen. Reproductive: The bladder is mildly distended and within normal limits. The uterus is grossly unremarkable in appearance. The ovaries are relatively symmetric. No suspicious adnexal masses are seen. Other: Postoperative change is noted along the anterior abdominal wall, and at both lower quadrants. Vague diffuse soft tissue inflammation is noted overlying the right greater femoral trochanter, with trace associated fluid extending from the greater femoral trochanter into the soft tissues, measuring approximately 3.3 x 1.8 x 0.7 cm. This could reflect an infected bursitis. Evolving abscess cannot be entirely excluded. Musculoskeletal: No acute osseous abnormalities are identified. Multilevel vacuum phenomenon is noted along the lower thoracic and lumbar spine. There is grade 2 anterolisthesis of L4 on L5, reflecting underlying facet disease. The visualized musculature is unremarkable in appearance. IMPRESSION: 1. Gallbladder diffusely inflamed and edematous, with scattered air in the gallbladder. Underlying stone seen. The appearance is suspicious for emphysematous cholecystitis. Alternatively, the first segment of the duodenum directly abuts the gallbladder, with associated soft tissue inflammation and question of communication. A cholecystoduodenal fistula cannot be excluded. 2. Underlying edema and congestion at the gallbladder fossa. Tiny focus of suspected  pneumobilia noted at the left hepatic lobe. 3. Vague diffuse soft tissue inflammation overlying the right greater femoral trochanter, with trace associated fluid extending from the greater femoral trochanter into the soft tissues, measuring approximately 3.3 x 1.8 x 0.7 cm. This could reflect an infected bursitis, with surrounding phlegmon. Evolving abscess cannot be entirely excluded. 4. 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 5. Scattered coronary artery calcifications seen. 6. 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 7. Aortobifemoral bypass graft appears grossly patent. Underlying diffuse aortic atherosclerosis. Nonspecific inflammation along the anterior aspect of the right common iliac artery graft. 8. Mild degenerative change at the lower thoracic and lumbar spine. These results were called by telephone at the time of interpretation on 10/13/2016 at 11:40 pm to Dr. Vanetta Mulders, who verbally acknowledged these results. Electronically Signed   By: Roanna Raider M.D.   On: 10/13/2016 23:43        Scheduled Meds: . Chlorhexidine Gluconate Cloth  6 each Topical Once  . [MAR Hold] ciprofloxacin  400 mg  Intravenous Q12H  . [MAR Hold] mouth rinse  15 mL Mouth Rinse BID  . [MAR Hold] metoprolol  2.5 mg Intravenous Q8H  . [MAR Hold] metronidazole  500 mg Intravenous Q8H  . [MAR Hold] ondansetron  4 mg Intravenous Once   Continuous Infusions: . dextrose 5 % and 0.9 % NaCl with KCl 40 mEq/L 75 mL/hr at 10/15/16 0826  . lactated ringers 10 mL/hr at 10/15/16 1224     LOS: 1 day     [facility]ALGI,Leyli Kevorkian, MD [facility] Pager [phone]  If 7PM-7AM, please contact night-coverage [email or web address] Password [email or web address] 10/15/2016, 1:32 PM

## 2016-10-15 NOTE — Op Note (Addendum)
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 Derrell LollingIngram, M.D., FACS  Assistant:                      Jimmye NormanJames Wyatt, M.D., FACS   Indication for Assistant: Complex exposure due to severe inflammation.  Surgeon assistant required to reduce incidence of intraoperative and postoperative complications  Operative Indications:   This is a 75 year old female with a history of coronary artery bypass grafting in May of this shear, abdominal aortic aneurysm grafting in August of this year.  She did well with that surgery.  She's had some abdominal pain for a few weeks but has been mild.  4 days prior to admission she developed severe nausea and vomiting.  Evaluation in the emergency department revealed white blood cell count of 15,700.  Normal liver function tests.  Normal lipase, and CT scan demonstrating significant inflammation of the gallbladder and duodenum with emphysematous cholecystitis versus a cholecystoduodenal fistula.  She was admitted and placed on antibiotics and symptomatically improved.  She continued have some pain and some tenderness although not dramatic.  She was evaluated by cardiology who felt she was stable from a cardiac standpoint.  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  The colon and omentum were densely adherent to the gallbladder and the gallbladder was not visible despite placing multiple trochars.  This necessitated converting to an open cholecystectomy through a right  subcostal incision.  The gallbladder was necrotic.  Once we entered the gallbladder it was filled with liquid pus and some stones.  Cultures were taken.  We were able to resect the transverse colon and duodenum off of the gallbladder and see that there was no fistula to either structure.  Procedure in Detail:          Following the induction of general endotracheal anesthesia the patient's abdomen was prepped and draped in a sterile fashion.  Surgical timeout was performed.  Intravenous antibiotics were updated.  0.5% Marcaine was used as a local infiltration anesthetic.     A 5 mm optical trocar was placed in the left subcostal region.  We entered the abdomen atraumatically.  We can see adhesions up and down the midline.  We were able to slowly find a window below the falciform ligament and above the adhesions and visualize the right upper quadrant.  We then put 2 trochars in the right subcostal region and from the right side we were able to take down the midline adhesions for the most part.  There are some intestinal adhesions in the lower midline and we left those alone.  We started to see if we could dissect the gallbladder and it was not safe to continue the dissection due to intense inflammation and bleeding.  We  abandoned the laparoscopic approach and removed all the trochars.  A right subcostal incision was made in the usual fashion.  Self-retaining retractors were placed.  I slowly dissected the omentum and transverse colon off of the gallbladder.  We entered the gallbladder and found purulent fluid under pressure which was cultured.  We evacuated all of this.  There are some black stones in the gallbladder which we evacuated.  We slowly dissected the gallbladder out of the liver.  We slowly dissected the transverse colon and duodenum away from the gallbladder and at the end of the case the structures were soft and uninjured.  We took the dissection all the way  down until we could identify the cystic  duct and the cystic artery.  The structures were clamped and divided and the gallbladder was removed.  Cystic duct and cystic artery were ligated with 2-0 Vicryl ties.  We spent some time irrigating and cauterizing the bed of the gallbladder.  We placed a 1719 [place] Blake drain in subhepatic space and extended it across through the foramen of Winslow slightly.  I then  placed a piece of Snow-brand hemostatic sponge in the gall bladder bed and everything looked hemostatic.  The posterior rectus sheath and transversus abdominis were closed with a running suture of #1 PDS.  The anterior rectus sheath and internal and external oblique were closed in a running suture of #1 PDS.  The skin was closed loosely with a few staples and a lot of Telfa wicks.  The patient tolerated the procedure well was taken to PACU in stable condition.  EBL 300-400 mL.  Counts correct.  Complications none.     Angelia MouldHaywood M. Derrell LollingIngram, M.D., FACS General and Minimally Invasive Surgery Breast and Colorectal Surgery  10/15/2016 2:48 PM

## 2016-10-15 NOTE — Anesthesia Procedure Notes (Deleted)
Performed by: Trayquan Kolakowski J       

## 2016-10-15 NOTE — Anesthesia Preprocedure Evaluation (Signed)
Anesthesia Evaluation  Patient identified by MRN, date of birth, ID band Patient awake  General Assessment Comment:Recent CABG  Reviewed: Allergy & Precautions, Patient's Chart, lab work & pertinent test results  Airway Mallampati: II  TM Distance: >3 FB Neck ROM: Full    Dental   Pulmonary former smoker,    breath sounds clear to auscultation       Cardiovascular hypertension, + CAD, + CABG and + Peripheral Vascular Disease   Rhythm:Regular Rate:Normal     Neuro/Psych    GI/Hepatic GERD  ,  Endo/Other  negative endocrine ROS  Renal/GU negative Renal ROS     Musculoskeletal  (+) Arthritis ,   Abdominal   Peds  Hematology negative hematology ROS (+)   Anesthesia Other Findings   Reproductive/Obstetrics                                                             Anesthesia Evaluation  Patient identified by MRN, date of birth, ID band Patient awake    Reviewed: Allergy & Precautions, H&P , NPO status , Patient's Chart, lab work & pertinent test results  Airway Mallampati: II   Neck ROM: full    Dental   Pulmonary shortness of breath, former smoker,    breath sounds clear to auscultation       Cardiovascular hypertension, + CAD, + CABG and + Peripheral Vascular Disease   Rhythm:regular Rate:Normal  CABG 03/2016.  Normal LV fxn.   Neuro/Psych PSYCHIATRIC DISORDERS Anxiety Depression    GI/Hepatic GERD  ,  Endo/Other    Renal/GU      Musculoskeletal  (+) Arthritis ,   Abdominal   Peds  Hematology   Anesthesia Other Findings   Reproductive/Obstetrics                             Anesthesia Physical Anesthesia Plan  ASA: III  Anesthesia Plan: General   Post-op Pain Management:    Induction: Intravenous  Airway Management Planned: Oral ETT  Additional Equipment: Arterial line, CVP and Ultrasound Guidance Line  Placement  Intra-op Plan:   Post-operative Plan: Possible Post-op intubation/ventilation and Extubation in OR  Informed Consent: I have reviewed the patients History and Physical, chart, labs and discussed the procedure including the risks, benefits and alternatives for the proposed anesthesia with the patient or authorized representative who has indicated his/her understanding and acceptance.     Plan Discussed with: CRNA, Anesthesiologist and Surgeon  Anesthesia Plan Comments:         Anesthesia Quick Evaluation  Anesthesia Physical Anesthesia Plan  ASA: III  Anesthesia Plan: General   Post-op Pain Management:    Induction: Intravenous  Airway Management Planned: Oral ETT  Additional Equipment:   Intra-op Plan:   Post-operative Plan: Extubation in OR  Informed Consent: I have reviewed the patients History and Physical, chart, labs and discussed the procedure including the risks, benefits and alternatives for the proposed anesthesia with the patient or authorized representative who has indicated his/her understanding and acceptance.   Dental advisory given  Plan Discussed with: CRNA  Anesthesia Plan Comments:         Anesthesia Quick Evaluation

## 2016-10-16 ENCOUNTER — Inpatient Hospital Stay (HOSPITAL_COMMUNITY): Payer: Medicare HMO

## 2016-10-16 ENCOUNTER — Encounter (HOSPITAL_COMMUNITY): Payer: Self-pay | Admitting: General Surgery

## 2016-10-16 LAB — COMPREHENSIVE METABOLIC PANEL
ALBUMIN: 2.4 g/dL — AB (ref 3.5–5.0)
ALK PHOS: 69 U/L (ref 38–126)
ALT: 16 U/L (ref 14–54)
ANION GAP: 6 (ref 5–15)
AST: 42 U/L — ABNORMAL HIGH (ref 15–41)
BILIRUBIN TOTAL: 0.6 mg/dL (ref 0.3–1.2)
BUN: 6 mg/dL (ref 6–20)
CALCIUM: 8.5 mg/dL — AB (ref 8.9–10.3)
CO2: 24 mmol/L (ref 22–32)
Chloride: 106 mmol/L (ref 101–111)
Creatinine, Ser: 0.48 mg/dL (ref 0.44–1.00)
Glucose, Bld: 131 mg/dL — ABNORMAL HIGH (ref 65–99)
POTASSIUM: 4.3 mmol/L (ref 3.5–5.1)
Sodium: 136 mmol/L (ref 135–145)
TOTAL PROTEIN: 5.1 g/dL — AB (ref 6.5–8.1)

## 2016-10-16 LAB — CBC
HCT: 25.6 % — ABNORMAL LOW (ref 36.0–46.0)
HEMATOCRIT: 32.7 % — AB (ref 36.0–46.0)
HEMOGLOBIN: 10.1 g/dL — AB (ref 12.0–15.0)
Hemoglobin: 8.1 g/dL — ABNORMAL LOW (ref 12.0–15.0)
MCH: 26.2 pg (ref 26.0–34.0)
MCH: 27.2 pg (ref 26.0–34.0)
MCHC: 30.9 g/dL (ref 30.0–36.0)
MCHC: 31.6 g/dL (ref 30.0–36.0)
MCV: 84.9 fL (ref 78.0–100.0)
MCV: 85.9 fL (ref 78.0–100.0)
PLATELETS: 287 10*3/uL (ref 150–400)
Platelets: 350 10*3/uL (ref 150–400)
RBC: 3.85 MIL/uL — ABNORMAL LOW (ref 3.87–5.11)
RBC: 2.98 MIL/uL — ABNORMAL LOW (ref 3.87–5.11)
RDW: 17.1 % — AB (ref 11.5–15.5)
RDW: 17.1 % — AB (ref 11.5–15.5)
WBC: 14.1 10*3/uL — ABNORMAL HIGH (ref 4.0–10.5)
WBC: 18.5 10*3/uL — AB (ref 4.0–10.5)

## 2016-10-16 MED ORDER — SODIUM CHLORIDE 0.9 % IV BOLUS (SEPSIS)
1000.0000 mL | Freq: Once | INTRAVENOUS | Status: AC
  Administered 2016-10-16: 1000 mL via INTRAVENOUS

## 2016-10-16 MED ORDER — CARVEDILOL 3.125 MG PO TABS
3.1250 mg | ORAL_TABLET | Freq: Two times a day (BID) | ORAL | Status: DC
  Administered 2016-10-16 (×2): 3.125 mg via ORAL
  Filled 2016-10-16 (×2): qty 1

## 2016-10-16 MED ORDER — SERTRALINE HCL 100 MG PO TABS
100.0000 mg | ORAL_TABLET | Freq: Every day | ORAL | Status: DC
  Administered 2016-10-16 – 2016-10-23 (×8): 100 mg via ORAL
  Filled 2016-10-16 (×8): qty 1

## 2016-10-16 MED ORDER — LORAZEPAM 0.5 MG PO TABS
0.5000 mg | ORAL_TABLET | Freq: Every day | ORAL | Status: DC
  Administered 2016-10-17 – 2016-10-22 (×6): 0.5 mg via ORAL
  Filled 2016-10-16 (×6): qty 1

## 2016-10-16 MED ORDER — KCL IN DEXTROSE-NACL 40-5-0.9 MEQ/L-%-% IV SOLN
INTRAVENOUS | Status: AC
  Administered 2016-10-16: 11:00:00 via INTRAVENOUS
  Filled 2016-10-16 (×2): qty 1000

## 2016-10-16 MED ORDER — SODIUM CHLORIDE 0.9 % IV BOLUS (SEPSIS)
250.0000 mL | Freq: Once | INTRAVENOUS | Status: AC
  Administered 2016-10-16: 250 mL via INTRAVENOUS

## 2016-10-16 MED ORDER — SODIUM CHLORIDE 0.9 % IV BOLUS (SEPSIS)
500.0000 mL | Freq: Once | INTRAVENOUS | Status: AC
  Administered 2016-10-16: 500 mL via INTRAVENOUS

## 2016-10-16 MED ORDER — ATORVASTATIN CALCIUM 40 MG PO TABS
40.0000 mg | ORAL_TABLET | Freq: Every day | ORAL | Status: DC
  Administered 2016-10-16 – 2016-10-22 (×7): 40 mg via ORAL
  Filled 2016-10-16 (×7): qty 1

## 2016-10-16 MED ORDER — PANTOPRAZOLE SODIUM 40 MG PO TBEC
40.0000 mg | DELAYED_RELEASE_TABLET | Freq: Every day | ORAL | Status: DC
  Administered 2016-10-16 – 2016-10-23 (×8): 40 mg via ORAL
  Filled 2016-10-16 (×8): qty 1

## 2016-10-16 MED ORDER — ASPIRIN 325 MG PO TABS
325.0000 mg | ORAL_TABLET | Freq: Every day | ORAL | Status: DC
  Administered 2016-10-16 – 2016-10-23 (×8): 325 mg via ORAL
  Filled 2016-10-16 (×8): qty 1

## 2016-10-16 NOTE — Progress Notes (Addendum)
At 5:10, pt used BSC and got back into bed.  Pt stated her mouth "felt funny" and she felt strange.  I asked the pt to state her name and slurred speech was noted.  Raymon MuttonWhitney Davis, RN called to bedside and noted slurred speech and change in patients orientation.  Rapid RN and MD called.  New orders received.  Upon arrival of Rapid RN and primary MD, symptoms subsided.  Surgery MD Andrey CampanileWilson called and ok with starting Aspirin 325 PO daily.  CT results pending.

## 2016-10-16 NOTE — Progress Notes (Signed)
Dr. Andrey CampanileWilson with surgery notified of patient's BP 80s/50s. Also notified of possible SQ air in patient's mid lower abdomen.  No CT recommended at this time. Possible CBC. Agreed with NS bolus infusion ordered by [facility].  Information from conversation [facility] to [facility] who is at bedside. Will continue to monitor patient.

## 2016-10-16 NOTE — Progress Notes (Addendum)
Addendum  I was paged to modify at about 5:15 PM stating that patient had slurred speech. Arrived to bedside. Rapid response nurse is evaluating patient. Transient slurred speech lasted for approximately 5 minutes when patient was out of bed to bedside commode and then resolved spontaneously.  Patient alert and oriented. No cranial nerve or focal neurological deficits at this time. Blood pressure 111/66 mmHg. Heart rate in the 110s, sinus tachycardia. Patient in no obvious distress.  Assessment and plan Transient slurred speech/dysathria. Unclear etiology.? Related to mild hypotension/orthostatic hypotension. TIA is a possibility but seems less likely. Obtain CT head without contrast stat. Bedside RN swallow screen. Neuro checks every 2 hours. Aspirin 325 MG daily if okay with surgery (just had open cholecystectomy 10/15/16). Discussed with neuro hospitalist on call Dr. Roxy Mannsster who agrees with Mx.  Marcellus ScottHONGALGI,Haralambos Yeatts, MD, FACP, FHM. [facility] Pager [phone]  If 7PM-7AM, please contact night-coverage [email or web address] Password [email or web address] 10/16/2016, 5:39 PM

## 2016-10-16 NOTE — Progress Notes (Signed)
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 CT abdomen and pelvis done in ED showed features concerning for cholecystitis with possible fistula. General surgery and cardiology were consulted. Cardiology cleared patient and patient underwent diagnostic laparoscopy with lysis of adhesions, conversion to open cholecystectomy 10/15/16. Postop transferred to stepdown unit. Improving.   Assessment & Plan:   Principal Problem:   Acute cholecystitis Active Problems:   HYPERTENSION, BENIGN ESSENTIAL   S/P CABG x 4   Status post AAA (abdominal aortic aneurysm) repair   Generalized abdominal pain   Acute gangrenous cholecystitis   Protein-calorie malnutrition, severe   1. Acute cholecystitis with cholelithiasis, s/p cholecystectomy 11/15: General surgery was consulted and after cardiology clearance, patient underwent surgery on 10/14/16. She had diagnostic laparoscopy with lysis of adhesions, conversion to open cholecystectomy for acute gangrenous gallbladder, empyema of gallbladder and severe adhesions. Management per general surgery. Gallbladder/abscess culture results: Moderate gram-negative rods-follow final results. 2. CAD status post CABG: No chest pain reported. Cardiology input appreciated and have cleared her for surgery. Resumed reduced dose carvedilol 3.125 MG twice a day postoperatively on 11/16. Titrate to home dose in a.m. provided blood pressure tolerates. 3. Status post AAA repair August 2017 4. Essential hypertension: Soft blood pressures with systolic in the 90s. Started reduced dose carvedilol due to sinus tachycardia. Continue IV fluids. 5. Hypokalemia: Replaced 6. Anemia: Stable  postoperatively.   DVT prophylaxis: SCDs Code Status: Full Family Communication: None at bedside Disposition Plan: DC home when medically improved. She was transferred to stepdown unit postoperatively on 11/15.   Consultants:   General surgery  Cardiology   Procedures:  Diagnostic laparoscopy with lysis of adhesions, conversion to open cholecystectomy 11/15  Antimicrobials:   IV Cipro 11/12 >  IV metronidazole 11/12 >    Subjective: Appropriate postop pain in the abdomen. Tolerated some oral liquids. Denies any other complaints. No dyspnea or chest pain. As per RN, no acute issues.  Objective:  Vitals:   10/16/16 0328 10/16/16 0748 10/16/16 1058 10/16/16 1440  BP: (!) 107/59 (!) 95/59 (!) 96/52 (!) 93/52  Pulse: (!) 110 (!) 108 (!) 115 (!) 115  Resp: (!) 21 (!) 22 (!) 24 (!) 21  Temp: 97.5 F (36.4 C) 98.3 F (36.8 C) 98.4 F (36.9 C) 99.7 F (37.6 C)  TempSrc: Oral Oral Oral Oral  SpO2: 96% 98% 92% 90%  Weight:      Height:        Intake/Output Summary (Last 24 hours) at 10/16/16 1633 Last data filed at 10/16/16 1500  Gross per 24 hour  Intake             1815 ml  Output             1231 ml  Net              584 ml   Filed Weights   10/14/16 0136 10/15/16 2045  Weight: 54.7 kg (120 lb 11.2 oz) 55.5 kg (122 lb 5.7 oz)    Examination:  General exam: Pleasant elderly female lying comfortably propped up in bed. Respiratory system: Clear to auscultation. Respiratory effort normal. Cardiovascular system: S1 & S2 heard, RRR. No JVD, murmurs, rubs, gallops or clicks. No  pedal edema. Telemetry: Sinus tachycardia in the 110s. Gastrointestinal system: Abdomen: Surgical site dressing is clean and dry. Appropriate tenderness in upper abdomen without peritoneal signs. No organomegaly or masses felt. Normal bowel sounds heard. Central nervous system: Alert and oriented. No focal neurological deficits. Extremities: Symmetric 5 x 5 power. Skin: No rashes, lesions or  ulcers Psychiatry: Judgement and insight appear normal. Mood & affect appropriate.     Data Reviewed: I have personally reviewed following labs and imaging studies  CBC:  Recent Labs Lab 10/13/16 1627 10/14/16 0310 10/15/16 0330 10/15/16 1655 10/16/16 0426  WBC 15.7* 7.5 4.8 19.7* 14.1*  NEUTROABS  --  4.6  --   --   --   HGB 12.0 9.9* 10.3* 11.1* 10.1*  HCT 37.1 31.5* 32.7* 35.0* 32.7*  MCV 84.9 84.7 84.5 86.0 84.9  PLT 428* 296 294 375 350   Basic Metabolic Panel:  Recent Labs Lab 10/13/16 1627 10/14/16 0310 10/15/16 0330 10/15/16 1655 10/16/16 0426  NA 134* 135 137 136 136  K 4.5 3.6 3.4* 4.2 4.3  CL 97* 105 105 105 106  CO2 24 23 24 22 24   GLUCOSE 105* 100* 87 181* 131*  BUN 27* 21* 6 6 6   CREATININE 1.14* 0.78 0.46 0.58 0.48  CALCIUM 9.5 8.6* 8.9 8.8* 8.5*   GFR: Estimated Creatinine Clearance: 48.8 mL/min (by C-G formula based on SCr of 0.48 mg/dL). Liver Function Tests:  Recent Labs Lab 10/13/16 1627 10/14/16 0310 10/15/16 0330 10/16/16 0426  AST 25 19 21  42*  ALT 10* 8* 8* 16  ALKPHOS 100 74 79 69  BILITOT 0.3 0.3 0.5 0.6  PROT 6.8 5.6* 5.6* 5.1*  ALBUMIN 3.5 2.6* 2.7* 2.4*    Recent Labs Lab 10/13/16 1627  LIPASE 41   No results for input(s): AMMONIA in the last 168 hours. Coagulation Profile: No results for input(s): INR, PROTIME in the last 168 hours. Cardiac Enzymes: No results for input(s): CKTOTAL, CKMB, CKMBINDEX, TROPONINI in the last 168 hours. BNP (last 3 results) No results for input(s): PROBNP in the last 8760 hours. HbA1C: No results for input(s): HGBA1C in the last 72 hours. CBG: No results for input(s): GLUCAP in the last 168 hours. Lipid Profile: No results for input(s): CHOL, HDL, LDLCALC, TRIG, CHOLHDL, LDLDIRECT in the last 72 hours. Thyroid Function Tests: No results for input(s): TSH, T4TOTAL, FREET4, T3FREE, THYROIDAB in the last 72 hours. Anemia Panel: No results for input(s): VITAMINB12, FOLATE, FERRITIN,  TIBC, IRON, RETICCTPCT in the last 72 hours.  Sepsis Labs: No results for input(s): PROCALCITON, LATICACIDVEN in the last 168 hours.  Recent Results (from the past 240 hour(s))  Surgical PCR screen     Status: None   Collection Time: 10/15/16  4:35 AM  Result Value Ref Range Status   MRSA, PCR NEGATIVE NEGATIVE Final   Staphylococcus aureus NEGATIVE NEGATIVE Final    Comment:        The Xpert SA Assay (FDA approved for NASAL specimens in patients over 74 years of age), is one component of a comprehensive surveillance program.  Test performance has been validated by [facility] for patients greater than or equal to 74 year old. It is not intended to diagnose infection nor to guide or monitor treatment.   Aerobic/Anaerobic Culture (surgical/deep wound)     Status: None (Preliminary result)   Collection Time: 10/15/16  1:50 PM  Result Value Ref Range Status   Specimen Description ABSCESS  Final   Special Requests GALL BLADDER  Final   Gram Stain   Final    MODERATE WBC PRESENT,BOTH PMN AND MONONUCLEAR FEW GRAM NEGATIVE RODS FEW GRAM POSITIVE COCCI IN CHAINS    Culture MODERATE GRAM NEGATIVE RODS  Final   Report Status PENDING  Incomplete         Radiology Studies: No results found.      Scheduled Meds: . sodium chloride   Intravenous Once  . atorvastatin  40 mg Oral q1800  . carvedilol  3.125 mg Oral BID WC  . Chlorhexidine Gluconate Cloth  6 each Topical Once  . ciprofloxacin  400 mg Intravenous Q12H  . enoxaparin (LOVENOX) injection  40 mg Subcutaneous Q24H  . LORazepam  0.5 mg Oral QHS  . mouth rinse  15 mL Mouth Rinse BID  . metronidazole  500 mg Intravenous Q8H  . pantoprazole  40 mg Oral Daily  . senna  1 tablet Oral BID  . sertraline  100 mg Oral Q breakfast   Continuous Infusions: . dextrose 5 % and 0.9 % NaCl with KCl 40 mEq/L 50 mL/hr at 10/16/16 1045  . lactated ringers Stopped (10/15/16 2045)     LOS: 2 days     [facility] - Extended CareNGALGI,Nicole Fojtik,  MD [facility] Pager [phone]  If 7PM-7AM, please contact night-coverage [email or web address] Password [facility] 10/16/2016, 4:33 PM

## 2016-10-16 NOTE — Progress Notes (Signed)
Patient did well s/p diagnostic lap with lysis of adhesions and cholecystectomy.  Hemodynamically stable.  No new recs at this time.  Continue cardiac meds when taking PO.  Call with any questions.

## 2016-10-16 NOTE — Progress Notes (Signed)
Patient ID: Nicole Dean, female   DOB: Jan 09, 1941, 75 y.o.   MRN: [id number]  [facility]  1 Day Post-Op  Subjective: Very sore today, some persistent nausea no vomiting. Diet advanced to clears per medicine. No flatus or BM  Objective: Vital signs in last 24 hours: Temp:  [97.2 F (36.2 C)-98 F (36.7 C)] 97.5 F (36.4 C) (11/16 0328) Pulse Rate:  [79-122] 108 (11/16 0748) Resp:  [9-24] 22 (11/16 0748) BP: (95-146)/(59-85) 95/59 (11/16 0748) SpO2:  [92 %-100 %] 98 % (11/16 0748) Weight:  [122 lb 5.7 oz (55.5 kg)] 122 lb 5.7 oz (55.5 kg) (11/15 2045) Last BM Date: 10/13/16  Intake/Output from previous day: 11/15 0701 - 11/16 0700 In: 2542.5 [I.V.:2242.5; IV Piggyback:300] Out: 991 [Urine:650; Drains:66; Blood:275] Intake/Output this shift: Total I/O In: 100 [IV Piggyback:100] Out: 265 [Urine:250; Drains:15]  PE: Gen:  Alert, NAD, pleasant Cardio: RRR Pulm:  CTAB, effort normal Abd: Soft, mild distension, appropriately tender, clean/dry dressing in place, hypoactive BS, drain with minimal serosanguinous drainage  Lab Results:   Recent Labs  10/15/16 1655 10/16/16 0426  WBC 19.7* 14.1*  HGB 11.1* 10.1*  HCT 35.0* 32.7*  PLT 375 350   BMET  Recent Labs  10/15/16 1655 10/16/16 0426  NA 136 136  K 4.2 4.3  CL 105 106  CO2 22 24  GLUCOSE 181* 131*  BUN 6 6  CREATININE 0.58 0.48  CALCIUM 8.8* 8.5*   PT/INR No results for input(s): LABPROT, INR in the last 72 hours. CMP     Component Value Date/Time   NA 136 10/16/2016 0426   K 4.3 10/16/2016 0426   CL 106 10/16/2016 0426   CO2 24 10/16/2016 0426   GLUCOSE 131 (H) 10/16/2016 0426   BUN 6 10/16/2016 0426   CREATININE 0.48 10/16/2016 0426   CREATININE 0.62 06/04/2016 0958   CALCIUM 8.5 (L) 10/16/2016 0426   PROT 5.1 (L) 10/16/2016 0426   ALBUMIN 2.4 (L) 10/16/2016 0426   AST 42 (H) 10/16/2016 0426   ALT 16 10/16/2016 0426   ALKPHOS 69 10/16/2016 0426   BILITOT 0.6  10/16/2016 0426   GFRNONAA >60 10/16/2016 0426   GFRAA >60 10/16/2016 0426   Lipase     Component Value Date/Time   LIPASE 41 10/13/2016 1627       Studies/Results: No results found.  Anti-infectives: Anti-infectives    Start     Dose/Rate Route Frequency Ordered Stop   10/15/16 1900  ciprofloxacin (CIPRO) IVPB 400 mg     400 mg 200 mL/hr over 60 Minutes Intravenous Every 12 hours 10/15/16 1825     10/15/16 1830  metroNIDAZOLE (FLAGYL) IVPB 500 mg     500 mg 100 mL/hr over 60 Minutes Intravenous Every 8 hours 10/15/16 1825     10/14/16 1000  ciprofloxacin (CIPRO) IVPB 400 mg  Status:  Discontinued    Comments:  Cipro 400 mg IV q12h for CrCl > 30 mL/min   400 mg 200 mL/hr over 60 Minutes Intravenous Every 12 hours 10/14/16 0307 10/15/16 2104   10/14/16 0315  metroNIDAZOLE (FLAGYL) IVPB 500 mg  Status:  Discontinued     500 mg 100 mL/hr over 60 Minutes Intravenous Every 8 hours 10/14/16 0302 10/15/16 2103   10/14/16 0030  ciprofloxacin (CIPRO) IVPB 400 mg     400 mg 200 mL/hr over 60 Minutes Intravenous  Once 10/14/16 0020 10/14/16 1035       Assessment/Plan Diagnostic laparoscopy with lysis of  adhesions, conversion to open cholecystectomy 11/15 Dr. Derrell LollingIngram - POD 1 -gram stain: MODERATE WBC PRESENT,BOTH PMN AND MONONUCLEAR FEW GRAM NEGATIVE RODS FEW GRAM POSITIVE COCCI IN CHAINS; culture pending - WBC trending down 14.1, LFTs WNL - drain 66cc/24hr serosanguinous - still intermittently tachycardic and hypotensive  CAD status post CABG Status post AAA repair August 2017 HTN Anemia - Hg 10.1 today, continue to monitor Hypokalemia - resolved  ID - IV Cipro 11/12 >>, IV metronidazole 11/12 >> FEN - clears, IVF VTE - lovenox  Plan - diet advanced to clears per medicine, if n/v worsens may have to go back to NPO. Continue IVF at 100/hr. Encouraged ambulation today. Continue to monitor drain output. Continue to follow OR cultures. Labs in AM   LOS: 2 days    Edson SnowballBROOKE  A MILLER , [facility] 10/16/2016, 9:22 AM Pager: [phone] Consults: [phone] Mon-Fri 7:00 am-4:30 pm Sat-Sun 7:00 am-11:30 am

## 2016-10-16 NOTE — Significant Event (Signed)
Rapid Response Event Note  Overview: Time Called: 1713 Arrival Time: 1720 Event Type: Neurologic  Initial Focused Assessment: Per RN she [facility] bed patient began to be confused and had a facial droop and slurred speech.  BP 95/58 ST 115  RR 24  O2 sat 91 on RA.   Upon my arrival symptoms have resolved. NIHSS 0 Patient a little pale and very pale fingers.  Good pulses.  Placed on 2 L [place].  Patient skin color pink  Interventions: Hongalgi at bedside to assess patient Stat head CT done NIHSS 0  Plan of Care (if not transferred): Neuro check q 2 hour RN to call if symptoms return RN to complete stroke swallow screen RN to notify surgeon of event.  Event Summary: Name of Physician Notified: Hongalgi at 1713  Name of Consulting Physician Notified: Roxy Mannsster at 1715  Outcome: Stayed in room and stabalized  Event End Time: 1800  Marcellina MillinLayton, Emalee Knies

## 2016-10-16 NOTE — Care Management Note (Signed)
Case Management Note  Patient Details  Name: Talitha GivensJanice D Haston MRN: [id number] Date of Birth: 16-Oct-1941  Subjective/Objective:   S/p open chole, on 2 liters, clear diet, jp drain , has been a little tachy, she lives alone, but her son will be staying with her at dc for 1 or 2 weeks ,til how ever long she needs him per patient.  Her daughter will transport her home at dc.  [facility] [place] [place] She has had HH services with [facility] in the past and would like to work with them if she needs it again.  NCM will cont to follow for dc needs.                  Action/Plan:   Expected Discharge Date:                  Expected Discharge Plan:  Home w Home Health Services  In-House Referral:     Discharge planning Services  CM Consult  Post Acute Care Choice:    Choice offered to:     DME Arranged:    DME Agency:     HH Arranged:    HH Agency:     Status of Service:  In process, will continue to follow  If discussed at Long Length of Stay Meetings, dates discussed:    Additional Comments:  Leone Havenaylor, Eliza Green Clinton, RN 10/16/2016, 1:26 PM

## 2016-10-17 LAB — COMPREHENSIVE METABOLIC PANEL
ALT: 12 U/L — AB (ref 14–54)
AST: 26 U/L (ref 15–41)
Albumin: 1.8 g/dL — ABNORMAL LOW (ref 3.5–5.0)
Alkaline Phosphatase: 56 U/L (ref 38–126)
Anion gap: 4 — ABNORMAL LOW (ref 5–15)
BILIRUBIN TOTAL: 0.3 mg/dL (ref 0.3–1.2)
BUN: 6 mg/dL (ref 6–20)
CALCIUM: 7.5 mg/dL — AB (ref 8.9–10.3)
CHLORIDE: 113 mmol/L — AB (ref 101–111)
CO2: 21 mmol/L — ABNORMAL LOW (ref 22–32)
CREATININE: 0.5 mg/dL (ref 0.44–1.00)
Glucose, Bld: 103 mg/dL — ABNORMAL HIGH (ref 65–99)
Potassium: 4.1 mmol/L (ref 3.5–5.1)
Sodium: 138 mmol/L (ref 135–145)
TOTAL PROTEIN: 3.8 g/dL — AB (ref 6.5–8.1)

## 2016-10-17 LAB — HEMOGLOBIN AND HEMATOCRIT, BLOOD
HCT: 30.4 % — ABNORMAL LOW (ref 36.0–46.0)
Hemoglobin: 9.7 g/dL — ABNORMAL LOW (ref 12.0–15.0)

## 2016-10-17 LAB — LACTIC ACID, PLASMA: LACTIC ACID, VENOUS: 1.8 mmol/L (ref 0.5–1.9)

## 2016-10-17 LAB — PREPARE RBC (CROSSMATCH)

## 2016-10-17 MED ORDER — DEXTROSE 5 % IV SOLN
1.0000 g | INTRAVENOUS | Status: DC
  Administered 2016-10-17 – 2016-10-22 (×6): 1 g via INTRAVENOUS
  Filled 2016-10-17 (×8): qty 10

## 2016-10-17 MED ORDER — KCL IN DEXTROSE-NACL 40-5-0.9 MEQ/L-%-% IV SOLN
INTRAVENOUS | Status: AC
  Administered 2016-10-17: 14:00:00 via INTRAVENOUS
  Filled 2016-10-17: qty 1000

## 2016-10-17 MED ORDER — SODIUM CHLORIDE 0.9 % IV BOLUS (SEPSIS)
500.0000 mL | Freq: Once | INTRAVENOUS | Status: AC
  Administered 2016-10-17 (×2): 500 mL via INTRAVENOUS

## 2016-10-17 MED ORDER — BOOST / RESOURCE BREEZE PO LIQD
1.0000 | Freq: Three times a day (TID) | ORAL | Status: DC
  Administered 2016-10-17 – 2016-10-19 (×5): 1 via ORAL
  Administered 2016-10-20 (×2): via ORAL
  Administered 2016-10-20 – 2016-10-21 (×3): 1 via ORAL

## 2016-10-17 MED ORDER — SODIUM CHLORIDE 0.9 % IV SOLN: Freq: Once | INTRAVENOUS | Status: DC

## 2016-10-17 MED ORDER — SODIUM CHLORIDE 0.9 % IV BOLUS (SEPSIS)
500.0000 mL | Freq: Once | INTRAVENOUS | Status: AC
  Administered 2016-10-17: 500 mL via INTRAVENOUS

## 2016-10-17 MED ORDER — BISACODYL 10 MG RE SUPP
10.0000 mg | Freq: Two times a day (BID) | RECTAL | Status: AC
  Administered 2016-10-17: 10 mg via RECTAL
  Filled 2016-10-17 (×2): qty 1

## 2016-10-17 NOTE — Progress Notes (Signed)
2 Days Post-Op  Subjective: Very pleasant.  Alert.  Oriented. Complains of incisional pain, very politely Had some nausea and vomiting Said she passed flatus Voiding without difficulty  Urine output adequate. BP a little soft.  Running in the 80s and 90s IV at 100 mL/h Hemoglobin has drifted down to 8.1, not surprising.  Potassium 4.1.  Creatinine 0.5.  LFTs normal.  Glucose 103.  Lactic acid 1.8.  Objective: Vital signs in last 24 hours: Temp:  [97.9 F (36.6 C)-100.3 F (37.9 C)] 97.9 F (36.6 C) (11/17 0335) Pulse Rate:  [89-118] 89 (11/17 0500) Resp:  [15-30] 15 (11/17 0500) BP: (80-115)/(41-72) 84/42 (11/17 0500) SpO2:  [90 %-100 %] 98 % (11/17 0500) Last BM Date: 10/13/16  Intake/Output from previous day: 11/16 0701 - 11/17 0700 In: 6237.1 [P.O.:600; I.V.:1337.1; IV Piggyback:4300] Out: 1166 [Urine:900; Emesis/NG output:1; Drains:265] Intake/Output this shift: Total I/O In: 5514.6 [P.O.:240; I.V.:1074.6; IV Piggyback:4200] Out: 625 [Urine:400; Drains:225]  General appearance: Alert and oriented.  Mental status normal.  Skin warm and dry.  Slightly pale Resp: clear to auscultation bilaterally GI: Soft.  Silent.  Slightly distended.  Wound clean.  JP drainage thin bloody.  No bowel or enteric drainage.  Lab Results:  Results for orders placed or performed during the hospital encounter of 10/13/16 (from the past 24 hour(s))  CBC     Status: Abnormal   Collection Time: 10/16/16  8:35 PM  Result Value Ref Range   WBC 18.5 (H) 4.0 - 10.5 K/uL   RBC 2.98 (L) 3.87 - 5.11 MIL/uL   Hemoglobin 8.1 (L) 12.0 - 15.0 g/dL   HCT 54.025.6 (L) 98.136.0 - 19.146.0 %   MCV 85.9 78.0 - 100.0 fL   MCH 27.2 26.0 - 34.0 pg   MCHC 31.6 30.0 - 36.0 g/dL   RDW 47.817.1 (H) 29.511.5 - 62.115.5 %   Platelets 287 150 - 400 K/uL  Comprehensive metabolic panel     Status: Abnormal   Collection Time: 10/17/16  2:22 AM  Result Value Ref Range   Sodium 138 135 - 145 mmol/L   Potassium 4.1 3.5 - 5.1 mmol/L    Chloride 113 (H) 101 - 111 mmol/L   CO2 21 (L) 22 - 32 mmol/L   Glucose, Bld 103 (H) 65 - 99 mg/dL   BUN 6 6 - 20 mg/dL   Creatinine, Ser 3.080.50 0.44 - 1.00 mg/dL   Calcium 7.5 (L) 8.9 - 10.3 mg/dL   Total Protein 3.8 (L) 6.5 - 8.1 g/dL   Albumin 1.8 (L) 3.5 - 5.0 g/dL   AST 26 15 - 41 U/L   ALT 12 (L) 14 - 54 U/L   Alkaline Phosphatase 56 38 - 126 U/L   Total Bilirubin 0.3 0.3 - 1.2 mg/dL   GFR calc non Af Amer >60 >60 mL/min   GFR calc Af Amer >60 >60 mL/min   Anion gap 4 (L) 5 - 15  Lactic acid, plasma     Status: None   Collection Time: 10/17/16  2:22 AM  Result Value Ref Range   Lactic Acid, Venous 1.8 0.5 - 1.9 mmol/L     Studies/Results: Ct Head Wo Contrast  Result Date: 10/16/2016 CLINICAL DATA:  A aphasia.  Left facial droop, improving. EXAM: CT HEAD WITHOUT CONTRAST TECHNIQUE: Contiguous axial images were obtained from the base of the skull through the vertex without intravenous contrast. COMPARISON:  None. FINDINGS: Brain: 2 oval areas of decreased density in the pons, 1 centrally on the right  and 1 anteriorly on the left. No intracranial hemorrhage or mass effect. 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 Vascular: No hyperdense vessel or unexpected calcification. Skull: Normal. Negative for fracture or focal lesion. Sinuses/Orbits: No acute finding. Other: None. IMPRESSION: 1. Possible subacute infarcts or areas of chronic small vessel ischemic change in the pons on both sides. 2. Mild diffuse cerebral atrophy. Electronically Signed   By: Beckie Salts M.D.   On: 10/16/2016 18:05   Mr Maxine Glenn Head Wo Contrast  Result Date: 10/16/2016 CLINICAL DATA:  Slurred speech and facial droop. Altered mental status. Follow-up abnormal CT HEAD. EXAM: MRI HEAD WITHOUT CONTRAST MRA HEAD WITHOUT CONTRAST TECHNIQUE: Multiplanar, multiecho pulse sequences of the brain and surrounding structures were obtained without intravenous contrast. Angiographic images of the head were obtained  using MRA technique without contrast. COMPARISON:  CT HEAD October 16, 2016 at 1747 hours FINDINGS: MRI HEAD FINDINGS BRAIN: No reduced diffusion to suggest acute ischemia. No susceptibility artifact to suggest hemorrhage. A few nonspecific punctate foci of susceptibility artifact. The ventricles and sulci are normal for patient's age. LEFT cerebellar infarcts. Scattered sub cm supratentorial and pontine white matter FLAIR T2 hyperintensities are less than expected for age most compatible with chronic small vessel ischemic disease. No suspicious parenchymal signal, masses or mass effect. No abnormal extra-axial fluid collections. No extra-axial masses though, contrast enhanced sequences would be more sensitive. VASCULAR: Normal major intracranial vascular flow voids present at skull base. SKULL AND UPPER CERVICAL SPINE: No abnormal sellar expansion. No suspicious calvarial bone marrow signal. Craniocervical junction maintained. SINUSES/ORBITS: The mastoid air-cells and included paranasal sinuses are well-aerated. Status post bilateral ocular lens implants. The included ocular globes and orbital contents are non-suspicious. OTHER: Patient is edentulous. MRA HEAD FINDINGS ANTERIOR CIRCULATION: Normal flow related enhancement of the included cervical, petrous, cavernous and supraclinoid internal carotid arteries. Patent anterior communicating artery. Normal flow related enhancement of the anterior and middle cerebral arteries, including distal segments. No large vessel occlusion, high-grade stenosis, abnormal luminal irregularity, aneurysm. POSTERIOR CIRCULATION: RIGHT vertebral artery is dominant. Basilar artery is patent, with normal flow related enhancement of the main branch vessels. Normal flow related enhancement of the posterior cerebral arteries. Small RIGHT, robust LEFT posterior communicating arteries present. No large vessel occlusion, high-grade stenosis, abnormal luminal irregularity, aneurysm.  IMPRESSION: MRI HEAD: No acute intracranial process, specifically no acute ischemia. 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 Minimal chronic small vessel ischemic disease. MRA HEAD: Negative; no emergent large vessel or severe stenosis. Electronically Signed   By: Awilda Metro M.D.   On: 10/16/2016 22:28   Mr Brain Wo Contrast  Result Date: 10/16/2016 CLINICAL DATA:  Slurred speech and facial droop. Altered mental status. Follow-up abnormal CT HEAD. EXAM: MRI HEAD WITHOUT CONTRAST MRA HEAD WITHOUT CONTRAST TECHNIQUE: Multiplanar, multiecho pulse sequences of the brain and surrounding structures were obtained without intravenous contrast. Angiographic images of the head were obtained using MRA technique without contrast. COMPARISON:  CT HEAD October 16, 2016 at 1747 hours FINDINGS: MRI HEAD FINDINGS BRAIN: No reduced diffusion to suggest acute ischemia. No susceptibility artifact to suggest hemorrhage. A few nonspecific punctate foci of susceptibility artifact. The ventricles and sulci are normal for patient's age. LEFT cerebellar infarcts. Scattered sub cm supratentorial and pontine white matter FLAIR T2 hyperintensities are less than expected for age most compatible with chronic small vessel ischemic disease. No suspicious parenchymal signal, masses or mass effect. No abnormal extra-axial fluid collections. No extra-axial masses though, contrast enhanced sequences would be more sensitive. VASCULAR:  Normal major intracranial vascular flow voids present at skull base. SKULL AND UPPER CERVICAL SPINE: No abnormal sellar expansion. No suspicious calvarial bone marrow signal. Craniocervical junction maintained. SINUSES/ORBITS: The mastoid air-cells and included paranasal sinuses are well-aerated. Status post bilateral ocular lens implants. The included ocular globes and orbital contents are non-suspicious. OTHER: Patient is edentulous. MRA HEAD FINDINGS ANTERIOR CIRCULATION: Normal flow related enhancement of the  included cervical, petrous, cavernous and supraclinoid internal carotid arteries. Patent anterior communicating artery. Normal flow related enhancement of the anterior and middle cerebral arteries, including distal segments. No large vessel occlusion, high-grade stenosis, abnormal luminal irregularity, aneurysm. POSTERIOR CIRCULATION: RIGHT vertebral artery is dominant. Basilar artery is patent, with normal flow related enhancement of the main branch vessels. Normal flow related enhancement of the posterior cerebral arteries. Small RIGHT, robust LEFT posterior communicating arteries present. No large vessel occlusion, high-grade stenosis, abnormal luminal irregularity, aneurysm. IMPRESSION: MRI HEAD: No acute intracranial process, specifically no acute ischemia. 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 Minimal chronic small vessel ischemic disease. MRA HEAD: Negative; no emergent large vessel or severe stenosis. Electronically Signed   By: Awilda Metroourtnay  Bloomer M.D.   On: 10/16/2016 22:28    . sodium chloride   Intravenous Once  . sodium chloride   Intravenous Once  . aspirin  325 mg Oral Daily  . atorvastatin  40 mg Oral q1800  . bisacodyl  10 mg Rectal BID  . Chlorhexidine Gluconate Cloth  6 each Topical Once  . ciprofloxacin  400 mg Intravenous Q12H  . enoxaparin (LOVENOX) injection  40 mg Subcutaneous Q24H  . LORazepam  0.5 mg Oral QHS  . mouth rinse  15 mL Mouth Rinse BID  . metronidazole  500 mg Intravenous Q8H  . pantoprazole  40 mg Oral Daily  . senna  1 tablet Oral BID  . sertraline  100 mg Oral Q breakfast     Assessment/Plan: s/p Procedure(s): LAPAROSCOPIC CONVERTED TO OPEN CHOLECYSTECTOMY    Diagnostic laparoscopy with lysis of adhesions, conversion to open cholecystectomy 11/15 Dr. Derrell LollingIngram - POD 2 -gram stain: FEW GRAM NEGATIVE RODS FEW GRAM POSITIVE COCCI IN CHAINS; culture pending -Still has ileus.  Clear liquids only.  Duphalac suppository - drainage as expected - still  intermittently tachycardic and hypotensive -Continue IV at 100 mL per hour -Transfuse 1 unit PRBC -Leave in SDU -Okay to mobilize continue antibiotics for gangrenous cholecystitis with empyema of gallbladder.  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 .prob

## 2016-10-17 NOTE — Progress Notes (Signed)
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 CT abdomen and pelvis done in ED showed features concerning for cholecystitis with possible fistula. General surgery and cardiology were consulted. Cardiology cleared patient and patient underwent diagnostic laparoscopy with lysis of adhesions, conversion to open cholecystectomy 10/15/16. Postop transferred to stepdown unit. Improving.  Assessment & Plan Acute cholecystitis with cholelithiasis, s/p cholecystectomy 11/15 - General surgery was consulted and after cardiology clearance, patient underwent surgery on 10/14/16 - s/p diagnostic laparoscopy with lysis of adhesions, conversion to open cholecystectomy for acute gangrenous gallbladder, empyema of gallbladder and severe adhesions - Management per general surgery - Gallbladder/abscess culture results, E. Coli resistant to Cipro, ABX changed to Rocephin today 10/17/2016 - pt overall better  Post op fever  - noted this AM T up to 100.3 F, with HR up to 101 and RR > 22, WBC up from 14 --> 18 (11/16), no new WBC - this is highly concerning for underlying sepsis - please note this could also be related to the above, gallbladder cultures with E. Coli but now noted to be resistant to Cipro and ABX already change to Rocephin  - if we note persistent fever and worsening WBC after Rocephin initiation, will have to proceed with full sepsis work up  CAD status post CABG - No chest pain reported. Cardiology input appreciated and have cleared her for surgery.  - Resumed reduced dose carvedilol 3.125 mg BID postop on 11/16, this was stopped due to low BP - may resume in AM based on BP control   Brief episode of  slurred speech 11/16 and hypotension - resolved quickly - pt now back to her baseline - MRI brain notable for remote hx of old, small left cerebellar infarct but no sings of acute infarction  - suspect all related to hypotension but still needs close monitoring  Status post AAA repair August 2017 - outpatient follow up  Essential hypertension - with episodes of hypotension last night 11/16 - off antihypertensives for now, SBP still in 90's this AM - monitor closely with no plan to resume Coreg yet   Hypokalemia - supplemented and WNL this AM - BMP in AM  Anemia of chronic disease  - slight drop in Hg since admission but no signs of active bleeding - will ask for anemia panel in AM - CBC in AM - currently no indication for transfusion   Severe PCM - nutritionist consulted - Boost Breeze PO TID requested   DVT prophylaxis: Lovenox SQ Code Status: Full Family Communication: None at bedside Disposition Plan: DC home when medically improved and when surgery team clears. She was transferred to stepdown unit postoperatively on 11/15.  Consultants:   General surgery  Cardiology   Procedures:   Diagnostic laparoscopy with lysis of adhesions, conversion to open cholecystectomy 11/15  Antimicrobials:   IV Cipro 11/12 --> 11/17, changed to Rocephin 11/17 based on sensitivity report -->  IV metronidazole 11/12 -->   Subjective: Appropriate postop pain in the abdomen. Tolerated some oral liquids. Denies any other complaints.  Objective:  Vitals:   10/17/16 0730 10/17/16 1100 10/17/16 1130 10/17/16 1204  BP:   (!) 97/49 (!) 95/46  Pulse: 88 (!) 102 91 91  Resp: 16 11 16 13   Temp: 98.2 F (36.8 C)  98.1 F (36.7 C) 98.5 F (36.9 C)  TempSrc: Oral  Oral Oral  SpO2: 98% 99% 97% 100%  Weight:      Height:        Intake/Output Summary (Last 24 hours) at 10/17/16 1358 Last data filed at 10/17/16 1157  Gross per 24 hour  Intake          7079.59 ml  Output              1336 ml  Net          5743.59 ml   Filed Weights   10/14/16 0136 10/15/16 2045  Weight: 54.7 kg (120 lb 11.2 oz) 55.5 kg (122 lb 5.7 oz)    Examination:  General exam: Pleasant elderly female lying comfortably propped up in bed. Respiratory system: Clear to auscultation. Respiratory effort normal. Cardiovascular system: S1 & S2 heard, RRR. No JVD, murmurs, rubs, gallops or clicks. No pedal edema. Gastrointestinal system: Abdomen: Surgical site dressing is clean and dry. Appropriate tenderness in upper abdomen without peritoneal signs. No organomegaly or masses felt. Normal bowel sounds heard. Central nervous system: Alert and oriented. No focal neurological deficits.  Data Reviewed: I have personally reviewed following labs and imaging studies  CBC:  Recent Labs Lab 10/14/16 0310 10/15/16 0330 10/15/16 1655 10/16/16 0426 10/16/16 2035  WBC 7.5 4.8 19.7* 14.1* 18.5*  NEUTROABS 4.6  --   --   --   --   HGB 9.9* 10.3* 11.1* 10.1* 8.1*  HCT 31.5* 32.7* 35.0* 32.7* 25.6*  MCV 84.7 84.5 86.0 84.9 85.9  PLT 296 294 375 350 287   Basic Metabolic Panel:  Recent Labs Lab 10/14/16 0310 10/15/16 0330 10/15/16 1655 10/16/16 0426 10/17/16 0222  NA 135 137 136 136 138  K 3.6 3.4* 4.2 4.3 4.1  CL 105 105 105 106 113*  CO2 23 24 22 24  21*  GLUCOSE 100* 87 181* 131* 103*  BUN 21* 6 6 6 6   CREATININE 0.78 0.46 0.58 0.48 0.50  CALCIUM 8.6* 8.9 8.8* 8.5* 7.5*   Liver Function Tests:  Recent Labs Lab 10/13/16 1627 10/14/16 0310 10/15/16 0330 10/16/16 0426 10/17/16 0222  AST 25 19 21  42* 26  ALT 10* 8* 8* 16 12*  ALKPHOS 100 74 79 69 56  BILITOT 0.3 0.3 0.5 0.6 0.3  PROT 6.8 5.6* 5.6* 5.1* 3.8*  ALBUMIN 3.5 2.6* 2.7* 2.4* 1.8*    Recent Labs Lab 10/13/16 1627  LIPASE 41   Sepsis Labs:  Recent Labs Lab 10/17/16 0222  LATICACIDVEN 1.8    Recent Results (from the past 240 hour(s))  Surgical PCR screen     Status: None   Collection Time: 10/15/16  4:35 AM    Result Value Ref Range Status   MRSA, PCR NEGATIVE NEGATIVE Final   Staphylococcus aureus NEGATIVE NEGATIVE Final  Aerobic/Anaerobic Culture (surgical/deep wound)     Status: None (Preliminary result)   Collection Time: 10/15/16  1:50 PM  Result Value Ref Range Status   Specimen Description ABSCESS  Final   Special Requests GALL BLADDER  Final   Gram Stain   Final    MODERATE WBC PRESENT,BOTH PMN AND MONONUCLEAR FEW GRAM NEGATIVE RODS FEW GRAM POSITIVE COCCI IN CHAINS    Culture   Final    MODERATE ESCHERICHIA COLI MODERATE PROTEUS MIRABILIS SUSCEPTIBILITIES TO FOLLOW    Report Status PENDING  Incomplete   Organism ID, Bacteria ESCHERICHIA  COLI  Final      Susceptibility   Escherichia coli - MIC*    AMPICILLIN >=32 RESISTANT Resistant     CEFAZOLIN <=4 SENSITIVE Sensitive     CEFEPIME <=1 SENSITIVE Sensitive     CEFTAZIDIME <=1 SENSITIVE Sensitive     CEFTRIAXONE <=1 SENSITIVE Sensitive     CIPROFLOXACIN >=4 RESISTANT Resistant     GENTAMICIN <=1 SENSITIVE Sensitive     IMIPENEM <=0.25 SENSITIVE Sensitive     TRIMETH/SULFA <=20 SENSITIVE Sensitive     AMPICILLIN/SULBACTAM 16 INTERMEDIATE Intermediate     PIP/TAZO <=4 SENSITIVE Sensitive     Extended ESBL NEGATIVE Sensitive     * MODERATE ESCHERICHIA COLI       Radiology Studies: Ct Head Wo Contrast  Result Date: 10/16/2016 CLINICAL DATA:  A aphasia.  Left facial droop, improving. EXAM: CT HEAD WITHOUT CONTRAST TECHNIQUE: Contiguous axial images were obtained from the base of the skull through the vertex without intravenous contrast. COMPARISON:  None. FINDINGS: Brain: 2 oval areas of decreased density in the pons, 1 centrally on the right and 1 anteriorly on the left. No intracranial hemorrhage or mass effect. 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 Vascular: No hyperdense vessel or unexpected calcification. Skull: Normal. Negative for fracture or focal lesion. Sinuses/Orbits: No acute finding. Other: None.  IMPRESSION: 1. Possible subacute infarcts or areas of chronic small vessel ischemic change in the pons on both sides. 2. Mild diffuse cerebral atrophy. Electronically Signed   By: Beckie Salts M.D.   On: 10/16/2016 18:05   Mr Maxine Glenn Head Wo Contrast  Result Date: 10/16/2016 CLINICAL DATA:  Slurred speech and facial droop. Altered mental status. Follow-up abnormal CT HEAD. EXAM: MRI HEAD WITHOUT CONTRAST MRA HEAD WITHOUT CONTRAST TECHNIQUE: Multiplanar, multiecho pulse sequences of the brain and surrounding structures were obtained without intravenous contrast. Angiographic images of the head were obtained using MRA technique without contrast. COMPARISON:  CT HEAD October 16, 2016 at 1747 hours FINDINGS: MRI HEAD FINDINGS BRAIN: No reduced diffusion to suggest acute ischemia. No susceptibility artifact to suggest hemorrhage. A few nonspecific punctate foci of susceptibility artifact. The ventricles and sulci are normal for patient's age. LEFT cerebellar infarcts. Scattered sub cm supratentorial and pontine white matter FLAIR T2 hyperintensities are less than expected for age most compatible with chronic small vessel ischemic disease. No suspicious parenchymal signal, masses or mass effect. No abnormal extra-axial fluid collections. No extra-axial masses though, contrast enhanced sequences would be more sensitive. VASCULAR: Normal major intracranial vascular flow voids present at skull base. SKULL AND UPPER CERVICAL SPINE: No abnormal sellar expansion. No suspicious calvarial bone marrow signal. Craniocervical junction maintained. SINUSES/ORBITS: The mastoid air-cells and included paranasal sinuses are well-aerated. Status post bilateral ocular lens implants. The included ocular globes and orbital contents are non-suspicious. OTHER: Patient is edentulous. MRA HEAD FINDINGS ANTERIOR CIRCULATION: Normal flow related enhancement of the included cervical, petrous, cavernous and supraclinoid internal carotid arteries.  Patent anterior communicating artery. Normal flow related enhancement of the anterior and middle cerebral arteries, including distal segments. No large vessel occlusion, high-grade stenosis, abnormal luminal irregularity, aneurysm. POSTERIOR CIRCULATION: RIGHT vertebral artery is dominant. Basilar artery is patent, with normal flow related enhancement of the main branch vessels. Normal flow related enhancement of the posterior cerebral arteries. Small RIGHT, robust LEFT posterior communicating arteries present. No large vessel occlusion, high-grade stenosis, abnormal luminal irregularity, aneurysm. IMPRESSION: MRI HEAD: No acute intracranial process, specifically no acute ischemia. 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 Minimal chronic small vessel  ischemic disease. MRA HEAD: Negative; no emergent large vessel or severe stenosis. Electronically Signed   By: Awilda Metroourtnay  Bloomer M.D.   On: 10/16/2016 22:28   Mr Brain Wo Contrast  Result Date: 10/16/2016 CLINICAL DATA:  Slurred speech and facial droop. Altered mental status. Follow-up abnormal CT HEAD. EXAM: MRI HEAD WITHOUT CONTRAST MRA HEAD WITHOUT CONTRAST TECHNIQUE: Multiplanar, multiecho pulse sequences of the brain and surrounding structures were obtained without intravenous contrast. Angiographic images of the head were obtained using MRA technique without contrast. COMPARISON:  CT HEAD October 16, 2016 at 1747 hours FINDINGS: MRI HEAD FINDINGS BRAIN: No reduced diffusion to suggest acute ischemia. No susceptibility artifact to suggest hemorrhage. A few nonspecific punctate foci of susceptibility artifact. The ventricles and sulci are normal for patient's age. LEFT cerebellar infarcts. Scattered sub cm supratentorial and pontine white matter FLAIR T2 hyperintensities are less than expected for age most compatible with chronic small vessel ischemic disease. No suspicious parenchymal signal, masses or mass effect. No abnormal extra-axial fluid collections. No  extra-axial masses though, contrast enhanced sequences would be more sensitive. VASCULAR: Normal major intracranial vascular flow voids present at skull base. SKULL AND UPPER CERVICAL SPINE: No abnormal sellar expansion. No suspicious calvarial bone marrow signal. Craniocervical junction maintained. SINUSES/ORBITS: The mastoid air-cells and included paranasal sinuses are well-aerated. Status post bilateral ocular lens implants. The included ocular globes and orbital contents are non-suspicious. OTHER: Patient is edentulous. MRA HEAD FINDINGS ANTERIOR CIRCULATION: Normal flow related enhancement of the included cervical, petrous, cavernous and supraclinoid internal carotid arteries. Patent anterior communicating artery. Normal flow related enhancement of the anterior and middle cerebral arteries, including distal segments. No large vessel occlusion, high-grade stenosis, abnormal luminal irregularity, aneurysm. POSTERIOR CIRCULATION: RIGHT vertebral artery is dominant. Basilar artery is patent, with normal flow related enhancement of the main branch vessels. Normal flow related enhancement of the posterior cerebral arteries. Small RIGHT, robust LEFT posterior communicating arteries present. No large vessel occlusion, high-grade stenosis, abnormal luminal irregularity, aneurysm. IMPRESSION: MRI HEAD: No acute intracranial process, specifically no acute ischemia. 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 Minimal chronic small vessel ischemic disease. MRA HEAD: Negative; no emergent large vessel or severe stenosis. Electronically Signed   By: Awilda Metroourtnay  Bloomer M.D.   On: 10/16/2016 22:28   Scheduled Meds: . sodium chloride   Intravenous Once  . sodium chloride   Intravenous Once  . aspirin  325 mg Oral Daily  . atorvastatin  40 mg Oral q1800  . bisacodyl  10 mg Rectal BID  . cefTRIAXone (ROCEPHIN)  IV  1 g Intravenous Q24H  . Chlorhexidine Gluconate Cloth  6 each Topical Once  . enoxaparin (LOVENOX) injection  40 mg  Subcutaneous Q24H  . feeding supplement  1 Container Oral TID BM  . LORazepam  0.5 mg Oral QHS  . mouth rinse  15 mL Mouth Rinse BID  . metronidazole  500 mg Intravenous Q8H  . pantoprazole  40 mg Oral Daily  . senna  1 tablet Oral BID  . sertraline  100 mg Oral Q breakfast   Continuous Infusions:   LOS: 3 days    Debbora PrestoMAGICK-Gloriann Riede, MD [facility] Pager [phone]  If 7PM-7AM, please contact night-coverage [email or web address] Password [email or web address] 10/17/2016, 1:58 PM

## 2016-10-17 NOTE — Progress Notes (Signed)
Gallbladder cultures growing Escherichia coli resistant to Cipro  Plan: Discontinue Cipro Start Rocephin Continue Flagyl Pending final cultures  Ayannah Faddis M

## 2016-10-17 NOTE — Progress Notes (Addendum)
Shift summary: Brief HPI: Pt admitted secondary to acute cholecystitis and taken to surgery on 10/15/16. Laparoscopic surgery was transitioned to open cholecystectomy for gangrenous gallbladder. Pt has had soft BPs since surgery. 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  At shift change (1900 hrs), NP was called by Dr. Waymon AmatoHongalgi, [facility] attending, to report on pt. Around 1700 hrs 11/16, pt was OOB with assistance and felt "funny". MD went to bedside. Pt's BP was soft.  MD stated there was no focal neuro deficit noted at that time except slurred speech and confusion for approximately 5 mins. Symptoms resolved quickly. CT showed possible subacute infarcts right and left pons.  Boluses were started and IVF increased for hypotension. ASA given for prophylaxis (on this at home as well but has been held because of surgery on 11/15).  MD asked NP to f/up tests and go see pt.  NP to bedside initially around 1930 hrs.  S: Pt says that she feels better now and presently has no weakness, dizziness, HA, CP, numbness, tingling, disorientation, vision or speech changes. Stated she felt "funny" with event and that her face and left arm felt "tingly". No "black out spell" or syncopal episode this afternoon. "Never" had anything like this happen to her before. She is voiding.  O: Well appearing elderly WF in NAD. Afebrile. BP 80s. HR 90-105. O2 sat normal on 3L. RR normal. Pt is alert and oriented. RRR. Normal respiratory effort. No focal neuro deficit noted. Speech is clear and fluent. ? Slight left facial droop but haven't seen pt before. A/P: 1. Episodic slurred speech with tingling and mild confusion 11/16 afternoon. ? Etiology-TIA, stroke, vs hypotension.   ? orthostasis. NP called neuro about CT results and discussed incident. Neuro agrees with ASA and recommends MRI/MRA head without contrast. So ordered. NP to call neuro when MRI/MRA  results and he will review and determine further plan.  2. Hypotension-she has grade I diastolic dysfunction with EF 60% done just about 2 weeks ago. No volume overload noted on exam. Urine is dark. Will continue to bolus and hydrate with maintenance fluids. Holding Coreg. Hgb down to 8.1 (10.1) postop and this may be dilutional as well.  3. S/p open cholecystectomy-surgeon called earlier by RN to see if testing needed given hypotension postop. See note.  NP, with pt's permission, discussed issues and treatment plan with pt and son in room. Thoroughly explained situation and that further testing would be done for earlier event with MRI and neuro would be called again after testing. Explained fluids for BP and that it was good that pt was alert and making urine.  Update around 1am-further boluses have been given for hypotension. Ordered LA, BMP which are pending. Pt still voiding well. Alert. Do not notice left facial droop now. NP spoke with neuro again about an hour ago. MRI/MRA negative for acute. Some evidence of old infarct. MRA with clean cranial vessels. Per neuro, earlier event was likely secondary to hypotension.  NP updated pt at bedside and called son on the phone. Pt is resting quietly. Continue boluses and IVF. Should we reach about 5L of fluid and still hypotensive, will call PCCM, although pt looks good even with soft BP.  KJKG, NP [facility] Update: Pt remains with soft BP in the 80s after about 5L IVF. She remains oriented and has made 400cc urine in last 6 hours. NP discussed pt  with critical care who looked in on pt via camera. LA 1.8. Per PCCM, can hold on further fluids at this time and if pt remains alert with good mental status and is voiding, can just continue to watch. Will follow.  KJKG, NP [facility]

## 2016-10-17 NOTE — Progress Notes (Signed)
Nutrition Follow Up  DOCUMENTATION CODES:   Severe malnutrition in context of acute illness/injury  INTERVENTION:    Boost Breeze po TID, each supplement provides 250 kcal and 9 grams of protein  NUTRITION DIAGNOSIS:   Malnutrition related to acute illness as evidenced by percent weight loss, energy intake < or equal to 50% for > or equal to 5 days, ongoing  GOAL:   Patient will meet greater than or equal to 90% of their needs, progressing  MONITOR:   Diet advancement, PO intake, Supplement acceptance, Labs, Weight trends, I & O's  ASSESSMENT:   75 y.o. Female with history of CAD status post CABG in May of this year, abdominal aneurysm repair in August of this year following which patient has been having persistent abdominal pain presents to the ER because of acute worsening of abdominal pain mostly in the right upper quadrant with nausea vomiting last 4 days.   Patient s/p procedure 11/15: DIAGNOSTIC LAPAROSCOPY LYSIS OF ADHESIONS CONVERSION TO OPEN CHOLECYSTECTOMY  Diet advanced to Clear Liquids 11/16 >> PO intake 65% per flowsheet records. Surgery note reviewed >> pt with some nausea, vomiting, and incisional pain. Malnutrition identified upon initial assessment and is ongoing. Would benefit from oral nutrition supplements >> will order. Labs and medications reviewed.  Diet Order:  Diet clear liquid Room service appropriate? Yes; Fluid consistency: Thin  Skin:  Reviewed, no issues  Last BM:  11/13  Height:   Ht Readings from Last 1 Encounters:  10/15/16 5\' 2"  (1.575 m)    Weight:   Wt Readings from Last 1 Encounters:  10/15/16 122 lb 5.7 oz (55.5 kg)    Ideal Body Weight:  50 kg  BMI:  Body mass index is 22.38 kg/m.  Estimated Nutritional Needs:   Kcal:  1450-1650  Protein:  70-80 gm  Fluid:  >/= 1.5 L  EDUCATION NEEDS:   No education needs identified at this time  Maureen ChattersKatie Arbie Reisz, RD, LDN Pager #: [phone] After-Hours Pager #:  [phone]

## 2016-10-18 LAB — TYPE AND SCREEN
ABO/RH(D): A POS
Antibody Screen: NEGATIVE
UNIT DIVISION: 0
UNIT DIVISION: 0
Unit division: 0
Unit division: 0

## 2016-10-18 LAB — CBC
HEMATOCRIT: 28.8 % — AB (ref 36.0–46.0)
Hemoglobin: 9.2 g/dL — ABNORMAL LOW (ref 12.0–15.0)
MCH: 27.8 pg (ref 26.0–34.0)
MCHC: 31.9 g/dL (ref 30.0–36.0)
MCV: 87 fL (ref 78.0–100.0)
Platelets: 255 10*3/uL (ref 150–400)
RBC: 3.31 MIL/uL — ABNORMAL LOW (ref 3.87–5.11)
RDW: 16.9 % — AB (ref 11.5–15.5)
WBC: 10 10*3/uL (ref 4.0–10.5)

## 2016-10-18 LAB — BASIC METABOLIC PANEL
Anion gap: 7 (ref 5–15)
CALCIUM: 7.9 mg/dL — AB (ref 8.9–10.3)
CO2: 20 mmol/L — AB (ref 22–32)
CREATININE: 0.36 mg/dL — AB (ref 0.44–1.00)
Chloride: 111 mmol/L (ref 101–111)
GFR calc Af Amer: >60 mL/min (ref >60)
GFR calc non Af Amer: >60 mL/min (ref >60)
GLUCOSE: 101 mg/dL — AB (ref 65–99)
Potassium: 3.3 mmol/L — ABNORMAL LOW (ref 3.5–5.1)
Sodium: 138 mmol/L (ref 135–145)

## 2016-10-18 LAB — IRON AND TIBC
Iron: 11 ug/dL — ABNORMAL LOW (ref 28–170)
SATURATION RATIOS: 7 % — AB (ref 10.4–31.8)
TIBC: 167 ug/dL — ABNORMAL LOW (ref 250–450)
UIBC: 156 ug/dL

## 2016-10-18 LAB — RETICULOCYTES
RBC.: 3.31 MIL/uL — AB (ref 3.87–5.11)
RETIC COUNT ABSOLUTE: 39.7 10*3/uL (ref 19.0–186.0)
Retic Ct Pct: 1.2 % (ref 0.4–3.1)

## 2016-10-18 LAB — FERRITIN: FERRITIN: 240 ng/mL (ref 11–307)

## 2016-10-18 LAB — FOLATE: Folate: 12.1 ng/mL (ref >5.9)

## 2016-10-18 LAB — VITAMIN B12: Vitamin B-12: 719 pg/mL (ref 180–914)

## 2016-10-18 MED ORDER — POTASSIUM CHLORIDE CRYS ER 20 MEQ PO TBCR
40.0000 meq | EXTENDED_RELEASE_TABLET | Freq: Once | ORAL | Status: AC
  Administered 2016-10-18: 40 meq via ORAL
  Filled 2016-10-18: qty 2

## 2016-10-18 MED ORDER — BISACODYL 10 MG RE SUPP: 10.0000 mg | Freq: Every day | RECTAL | Status: DC | PRN

## 2016-10-18 MED ORDER — BISACODYL 10 MG RE SUPP: 10.0000 mg | Freq: Two times a day (BID) | RECTAL | Status: AC

## 2016-10-18 MED ORDER — HYDROMORPHONE HCL 2 MG/ML IJ SOLN: 1.0000 mg | INTRAMUSCULAR | Status: DC | PRN

## 2016-10-18 NOTE — Progress Notes (Addendum)
3 Days Post-Op  Subjective: Stable and alert.  Pleasant and cooperative,  Voiding okay Had 2 small bowel movements.  No nausea or vomiting  Cultures growing Escherichia coli resistant to penicillin/Cipro , and also growing Proteus. Antibiotic switch to Rocephin and Flagyl continued.  Received 1 unit PRBC to yesterday.  Normotensive.  Hemoglobin up to 9.2, appropriate rise.  WBC down to 10,000.  Cardiology has signed off.  Objective: Vital signs in last 24 hours: Temp:  [97.6 F (36.4 C)-99.2 F (37.3 C)] 97.6 F (36.4 C) (11/18 0415) Pulse Rate:  [88-109] 108 (11/18 0415) Resp:  [11-31] 21 (11/18 0415) BP: (88-130)/(45-71) 130/71 (11/18 0415) SpO2:  [90 %-100 %] 94 % (11/18 0415) Last BM Date: 10/17/16  Intake/Output from previous day: 11/17 0701 - 11/18 0700 In: 2105 [P.O.:360; I.V.:1060; Blood:335; IV Piggyback:350] Out: 1035 [Urine:850; Drains:185] Intake/Output this shift: Total I/O In: 740 [P.O.:240; I.V.:200; IV Piggyback:300] Out: 400 [Urine:300; Drains:100]    General appearance: Extremely pleasant.  Alert.  Mental status normal.  Mild distress. Resp: clear to auscultation bilaterally GI: Soft.   slightly distended.   Well-healed midline incision.  Subjectively tender in right upper quadrant but no guarding and no mass. Wound is clean.  No signs of infection.  Wicks removed and redressed.  JP drainage now clear, dark amber color   Lab Results:  Results for orders placed or performed during the hospital encounter of 10/13/16 (from the past 24 hour(s))  Prepare RBC     Status: None   Collection Time: 10/17/16  6:14 AM  Result Value Ref Range   Order Confirmation ORDER PROCESSED BY BLOOD BANK   Hemoglobin and hematocrit, blood     Status: Abnormal   Collection Time: 10/17/16  4:38 PM  Result Value Ref Range   Hemoglobin 9.7 (L) 12.0 - 15.0 g/dL   HCT 16.130.4 (L) 09.636.0 - 04.546.0 %  CBC     Status: Abnormal   Collection Time: 10/18/16  3:59 AM  Result Value Ref  Range   WBC 10.0 4.0 - 10.5 K/uL   RBC 3.31 (L) 3.87 - 5.11 MIL/uL   Hemoglobin 9.2 (L) 12.0 - 15.0 g/dL   HCT 40.928.8 (L) 81.136.0 - 91.446.0 %   MCV 87.0 78.0 - 100.0 fL   MCH 27.8 26.0 - 34.0 pg   MCHC 31.9 30.0 - 36.0 g/dL   RDW 78.216.9 (H) 95.611.5 - 21.315.5 %   Platelets 255 150 - 400 K/uL  Reticulocytes     Status: Abnormal   Collection Time: 10/18/16  3:59 AM  Result Value Ref Range   Retic Ct Pct 1.2 0.4 - 3.1 %   RBC. 3.31 (L) 3.87 - 5.11 MIL/uL   Retic Count, Manual 39.7 19.0 - 186.0 K/uL     Studies/Results: No results found.  . sodium chloride   Intravenous Once  . sodium chloride   Intravenous Once  . aspirin  325 mg Oral Daily  . atorvastatin  40 mg Oral q1800  . bisacodyl  10 mg Rectal BID  . bisacodyl  10 mg Rectal BID  . cefTRIAXone (ROCEPHIN)  IV  1 g Intravenous Q24H  . Chlorhexidine Gluconate Cloth  6 each Topical Once  . enoxaparin (LOVENOX) injection  40 mg Subcutaneous Q24H  . feeding supplement  1 Container Oral TID BM  . LORazepam  0.5 mg Oral QHS  . mouth rinse  15 mL Mouth Rinse BID  . metronidazole  500 mg Intravenous Q8H  . pantoprazole  40 mg  Oral Daily  . senna  1 tablet Oral BID  . sertraline  100 mg Oral Q breakfast     Assessment/Plan: s/p Procedure(s): LAPAROSCOPIC CONVERTED TO OPEN CHOLECYSTECTOMY   Diagnostic laparoscopy with lysis of adhesions, conversion to open cholecystectomy 11/15 Dr. Derrell LollingIngram -Empyema of gallbladder - POD 3 -Cultures with Escherichia coli resistant to Cipro.  Also Proteus. -Antibiotics adjusted to Rocephin and Flagyl yesterday -Ileus slowly resolving.  Advance to full liquids.  Dulcolax suppository - drainage - low volume bile leak suspected. - still intermittently tachycardic and hypotensive -Continue IV at 100 mL per hour -Transfer to 6 N. -Okay to mobilize -PT consult continue antibiotics for gangrenous cholecystitis with empyema of gallbladder.  CAD status post CABG Status post AAA repair August 2017 HTN Anemia  - Hg 10.1 today, continue to monitor Hypokalemia - resolved  ID-Rocephin and Flagyl FEN - clears, IVF VTE - lovenox   @PROBHOSP @  LOS: 4 days    Nicole Dean M 10/18/2016  . .prob

## 2016-10-18 NOTE — Progress Notes (Signed)
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 CT abdomen and pelvis done in ED showed features concerning for cholecystitis with possible fistula. General surgery and cardiology were consulted. Cardiology cleared patient and patient underwent diagnostic laparoscopy with lysis of adhesions, conversion to open cholecystectomy 10/15/16. Postop transferred to stepdown unit. Improving.  Assessment & Plan Acute cholecystitis with cholelithiasis, s/p cholecystectomy 11/15 - s/p diagnostic laparoscopy with lysis of adhesions, conversion to open cholecystectomy for acute gangrenous gallbladder, empyema of gallbladder and severe adhesions, post op day #3 - Management per general surgery - Gallbladder/abscess culture results, E. Coli resistant to Cipro, Proteus, ABX changed to Rocephin 10/17/2016 - pt overall better, diet advanced to clears - pt moved to 6N today   Post op fever  - noted 11/17 AM T up to 100.3 F, with HR up to 101 and RR > 22, WBC up from 14 --> 18 (11/16), no new WBC - please note this could have been related to the above, gallbladder cultures with E. Coli but now noted to be resistant to Cipro and ABX already change to Rocephin  - no fevers overnight and WBC is WNL this AM, pt clinically improving, says she feels better   CAD status post CABG - No chest pain reported. Cardiology input appreciated and have cleared her for surgery.  - Resumed reduced dose carvedilol 3.125 mg BID postop on 11/16, this was stopped due to low BP - may resume in AM based on BP control   Brief episode of slurred speech 11/16 and hypotension - resolved quickly - pt now back to her baseline - MRI brain notable  for remote hx of old, small left cerebellar infarct but no sings of acute infarction  - suspect all related to hypotension but still needs close monitoring  Status post AAA repair August 2017 - outpatient follow up  Essential hypertension - with episodes of hypotension last night 11/16 - off antihypertensives for now, SBP in 120's this AM - monitor closely with no plan to resume Coreg yet   Hypokalemia - supplement and repeat BMP in AM  Anemia of chronic disease  - slight drop in Hg since admission but no signs of active bleeding - anemia panel pending  - CBC in AM - currently no indication for transfusion   Severe PCM - nutritionist consulted - Boost Breeze PO TID requested   DVT prophylaxis: Lovenox SQ Code Status: Full Family Communication: None at bedside Disposition Plan: DC home when medically improved and when surgery team clears.  Consultants:   General surgery  Cardiology   Procedures:   Diagnostic laparoscopy with lysis of adhesions, conversion to open cholecystectomy 11/15  Antimicrobials:   IV Cipro 11/12 --> 11/17, changed to Rocephin 11/17 based on sensitivity report -->  IV metronidazole 11/12 -->   Subjective: Denies chest pain, no abd concerns.   Objective:  Vitals:   10/18/16 0000 10/18/16 0200 10/18/16 0415 10/18/16 0810  BP: (!) 113/58 115/60 130/71 122/63  Pulse: 100 94 (!) 108 100  Resp: 18 15 (!) 21 18  Temp:   97.6 F (36.4 C) 97.6 F (36.4 C)  TempSrc:   Oral Oral  SpO2: 95% 97% 94% 96%  Weight:  Height:        Intake/Output Summary (Last 24 hours) at 10/18/16 0847 Last data filed at 10/18/16 0700  Gross per 24 hour  Intake             2225 ml  Output              830 ml  Net             1395 ml   Filed Weights   10/14/16 0136 10/15/16 2045  Weight: 54.7 kg (120 lb 11.2 oz) 55.5 kg (122 lb 5.7 oz)    Examination:  General exam: Pleasant elderly female lying comfortably propped up in bed. Respiratory system:  Clear to auscultation. Respiratory effort normal. Cardiovascular system: S1 & S2 heard, RRR. No JVD, murmurs, rubs, gallops or clicks. No pedal edema. Gastrointestinal system: Abdomen: Surgical site dressing is clean and dry. Appropriate tenderness in upper abdomen without peritoneal signs. No organomegaly or masses felt. Normal bowel sounds heard. Central nervous system: Alert and oriented. No focal neurological deficits.  Data Reviewed: I have personally reviewed following labs and imaging studies  CBC:  Recent Labs Lab 10/14/16 0310 10/15/16 0330 10/15/16 1655 10/16/16 0426 10/16/16 2035 10/17/16 1638 10/18/16 0359  WBC 7.5 4.8 19.7* 14.1* 18.5*  --  10.0  NEUTROABS 4.6  --   --   --   --   --   --   HGB 9.9* 10.3* 11.1* 10.1* 8.1* 9.7* 9.2*  HCT 31.5* 32.7* 35.0* 32.7* 25.6* 30.4* 28.8*  MCV 84.7 84.5 86.0 84.9 85.9  --  87.0  PLT 296 294 375 350 287  --  255   Basic Metabolic Panel:  Recent Labs Lab 10/15/16 0330 10/15/16 1655 10/16/16 0426 10/17/16 0222 10/18/16 0359  NA 137 136 136 138 138  K 3.4* 4.2 4.3 4.1 3.3*  CL 105 105 106 113* 111  CO2 24 22 24  21* 20*  GLUCOSE 87 181* 131* 103* 101*  BUN 6 6 6 6  <5*  CREATININE 0.46 0.58 0.48 0.50 0.36*  CALCIUM 8.9 8.8* 8.5* 7.5* 7.9*   Liver Function Tests:  Recent Labs Lab 10/13/16 1627 10/14/16 0310 10/15/16 0330 10/16/16 0426 10/17/16 0222  AST 25 19 21  42* 26  ALT 10* 8* 8* 16 12*  ALKPHOS 100 74 79 69 56  BILITOT 0.3 0.3 0.5 0.6 0.3  PROT 6.8 5.6* 5.6* 5.1* 3.8*  ALBUMIN 3.5 2.6* 2.7* 2.4* 1.8*    Recent Labs Lab 10/13/16 1627  LIPASE 41   Sepsis Labs:  Recent Labs Lab 10/17/16 0222  LATICACIDVEN 1.8    Recent Results (from the past 240 hour(s))  Surgical PCR screen     Status: None   Collection Time: 10/15/16  4:35 AM  Result Value Ref Range Status   MRSA, PCR NEGATIVE NEGATIVE Final   Staphylococcus aureus NEGATIVE NEGATIVE Final  Aerobic/Anaerobic Culture (surgical/deep wound)      Status: None (Preliminary result)   Collection Time: 10/15/16  1:50 PM  Result Value Ref Range Status   Specimen Description ABSCESS  Final   Special Requests GALL BLADDER  Final   Gram Stain   Final    MODERATE WBC PRESENT,BOTH PMN AND MONONUCLEAR FEW GRAM NEGATIVE RODS FEW GRAM POSITIVE COCCI IN CHAINS    Culture   Final    MODERATE ESCHERICHIA COLI MODERATE PROTEUS MIRABILIS SUSCEPTIBILITIES TO FOLLOW    Report Status PENDING  Incomplete   Organism ID, Bacteria ESCHERICHIA COLI  Final  Susceptibility   Escherichia coli - MIC*    AMPICILLIN >=32 RESISTANT Resistant     CEFAZOLIN <=4 SENSITIVE Sensitive     CEFEPIME <=1 SENSITIVE Sensitive     CEFTAZIDIME <=1 SENSITIVE Sensitive     CEFTRIAXONE <=1 SENSITIVE Sensitive     CIPROFLOXACIN >=4 RESISTANT Resistant     GENTAMICIN <=1 SENSITIVE Sensitive     IMIPENEM <=0.25 SENSITIVE Sensitive     TRIMETH/SULFA <=20 SENSITIVE Sensitive     AMPICILLIN/SULBACTAM 16 INTERMEDIATE Intermediate     PIP/TAZO <=4 SENSITIVE Sensitive     Extended ESBL NEGATIVE Sensitive     * MODERATE ESCHERICHIA COLI       Radiology Studies: Ct Head Wo Contrast  Result Date: 10/16/2016 CLINICAL DATA:  A aphasia.  Left facial droop, improving. EXAM: CT HEAD WITHOUT CONTRAST TECHNIQUE: Contiguous axial images were obtained from the base of the skull through the vertex without intravenous contrast. COMPARISON:  None. FINDINGS: Brain: 2 oval areas of decreased density in the pons, 1 centrally on the right and 1 anteriorly on the left. No intracranial hemorrhage or mass effect. 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 Vascular: No hyperdense vessel or unexpected calcification. Skull: Normal. Negative for fracture or focal lesion. Sinuses/Orbits: No acute finding. Other: None. IMPRESSION: 1. Possible subacute infarcts or areas of chronic small vessel ischemic change in the pons on both sides. 2. Mild diffuse cerebral atrophy. Electronically Signed    By: Beckie Salts M.D.   On: 10/16/2016 18:05   Mr Maxine Glenn Head Wo Contrast  Result Date: 10/16/2016 CLINICAL DATA:  Slurred speech and facial droop. Altered mental status. Follow-up abnormal CT HEAD. EXAM: MRI HEAD WITHOUT CONTRAST MRA HEAD WITHOUT CONTRAST TECHNIQUE: Multiplanar, multiecho pulse sequences of the brain and surrounding structures were obtained without intravenous contrast. Angiographic images of the head were obtained using MRA technique without contrast. COMPARISON:  CT HEAD October 16, 2016 at 1747 hours FINDINGS: MRI HEAD FINDINGS BRAIN: No reduced diffusion to suggest acute ischemia. No susceptibility artifact to suggest hemorrhage. A few nonspecific punctate foci of susceptibility artifact. The ventricles and sulci are normal for patient's age. LEFT cerebellar infarcts. Scattered sub cm supratentorial and pontine white matter FLAIR T2 hyperintensities are less than expected for age most compatible with chronic small vessel ischemic disease. No suspicious parenchymal signal, masses or mass effect. No abnormal extra-axial fluid collections. No extra-axial masses though, contrast enhanced sequences would be more sensitive. VASCULAR: Normal major intracranial vascular flow voids present at skull base. SKULL AND UPPER CERVICAL SPINE: No abnormal sellar expansion. No suspicious calvarial bone marrow signal. Craniocervical junction maintained. SINUSES/ORBITS: The mastoid air-cells and included paranasal sinuses are well-aerated. Status post bilateral ocular lens implants. The included ocular globes and orbital contents are non-suspicious. OTHER: Patient is edentulous. MRA HEAD FINDINGS ANTERIOR CIRCULATION: Normal flow related enhancement of the included cervical, petrous, cavernous and supraclinoid internal carotid arteries. Patent anterior communicating artery. Normal flow related enhancement of the anterior and middle cerebral arteries, including distal segments. No large vessel occlusion,  high-grade stenosis, abnormal luminal irregularity, aneurysm. POSTERIOR CIRCULATION: RIGHT vertebral artery is dominant. Basilar artery is patent, with normal flow related enhancement of the main branch vessels. Normal flow related enhancement of the posterior cerebral arteries. Small RIGHT, robust LEFT posterior communicating arteries present. No large vessel occlusion, high-grade stenosis, abnormal luminal irregularity, aneurysm. IMPRESSION: MRI HEAD: No acute intracranial process, specifically no acute ischemia. 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 Minimal chronic small vessel ischemic disease. MRA HEAD: Negative; no emergent large  vessel or severe stenosis. Electronically Signed   By: Awilda Metroourtnay  Bloomer M.D.   On: 10/16/2016 22:28   Mr Brain Wo Contrast  Result Date: 10/16/2016 CLINICAL DATA:  Slurred speech and facial droop. Altered mental status. Follow-up abnormal CT HEAD. EXAM: MRI HEAD WITHOUT CONTRAST MRA HEAD WITHOUT CONTRAST TECHNIQUE: Multiplanar, multiecho pulse sequences of the brain and surrounding structures were obtained without intravenous contrast. Angiographic images of the head were obtained using MRA technique without contrast. COMPARISON:  CT HEAD October 16, 2016 at 1747 hours FINDINGS: MRI HEAD FINDINGS BRAIN: No reduced diffusion to suggest acute ischemia. No susceptibility artifact to suggest hemorrhage. A few nonspecific punctate foci of susceptibility artifact. The ventricles and sulci are normal for patient's age. LEFT cerebellar infarcts. Scattered sub cm supratentorial and pontine white matter FLAIR T2 hyperintensities are less than expected for age most compatible with chronic small vessel ischemic disease. No suspicious parenchymal signal, masses or mass effect. No abnormal extra-axial fluid collections. No extra-axial masses though, contrast enhanced sequences would be more sensitive. VASCULAR: Normal major intracranial vascular flow voids present at skull base. SKULL AND  UPPER CERVICAL SPINE: No abnormal sellar expansion. No suspicious calvarial bone marrow signal. Craniocervical junction maintained. SINUSES/ORBITS: The mastoid air-cells and included paranasal sinuses are well-aerated. Status post bilateral ocular lens implants. The included ocular globes and orbital contents are non-suspicious. OTHER: Patient is edentulous. MRA HEAD FINDINGS ANTERIOR CIRCULATION: Normal flow related enhancement of the included cervical, petrous, cavernous and supraclinoid internal carotid arteries. Patent anterior communicating artery. Normal flow related enhancement of the anterior and middle cerebral arteries, including distal segments. No large vessel occlusion, high-grade stenosis, abnormal luminal irregularity, aneurysm. POSTERIOR CIRCULATION: RIGHT vertebral artery is dominant. Basilar artery is patent, with normal flow related enhancement of the main branch vessels. Normal flow related enhancement of the posterior cerebral arteries. Small RIGHT, robust LEFT posterior communicating arteries present. No large vessel occlusion, high-grade stenosis, abnormal luminal irregularity, aneurysm. IMPRESSION: MRI HEAD: No acute intracranial process, specifically no acute ischemia. 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 Minimal chronic small vessel ischemic disease. MRA HEAD: Negative; no emergent large vessel or severe stenosis. Electronically Signed   By: Awilda Metroourtnay  Bloomer M.D.   On: 10/16/2016 22:28   Scheduled Meds: . sodium chloride   Intravenous Once  . sodium chloride   Intravenous Once  . aspirin  325 mg Oral Daily  . atorvastatin  40 mg Oral q1800  . bisacodyl  10 mg Rectal BID  . bisacodyl  10 mg Rectal BID  . cefTRIAXone (ROCEPHIN)  IV  1 g Intravenous Q24H  . Chlorhexidine Gluconate Cloth  6 each Topical Once  . enoxaparin (LOVENOX) injection  40 mg Subcutaneous Q24H  . feeding supplement  1 Container Oral TID BM  . LORazepam  0.5 mg Oral QHS  . mouth rinse  15 mL Mouth Rinse BID    . metronidazole  500 mg Intravenous Q8H  . pantoprazole  40 mg Oral Daily  . senna  1 tablet Oral BID  . sertraline  100 mg Oral Q breakfast   Continuous Infusions:   LOS: 4 days    Debbora PrestoMAGICK-MYERS, ISKRA, MD [facility] Pager [phone]  If 7PM-7AM, please contact night-coverage [email or web address] Password [email or web address] 10/18/2016, 8:47 AM

## 2016-10-18 NOTE — Evaluation (Signed)
Physical Therapy Evaluation Patient Details Name: Nicole GivensJanice D Dean MRN: [id number] DOB: 03/26/1941 Today's Date: 10/18/2016   History of Present Illness  Patient is a 75 y/o female with hx of anxiety, CAD, HTN, HLD, AAA repair, depression presents s/p diagnostic laparoscopy with lysis of adhesions with conversion to open cholecystectomy 10/15/16  Clinical Impression  Patient presents with pain, deconditioning/weakness, decreased balance and impaired mobility s/p above. Pain is pt's biggest limiting factor at this time. Tolerated short distance ambulation with Min A for balance. Pt plans to have son stay with her at d/c. Anticipate once pain is improved, mobility will improve as well. Will try RW next session. Will follow acutely to maximize independence and mobility prior to return home.     Follow Up Recommendations Home health PT;Supervision for mobility/OOB    Equipment Recommendations  None recommended by PT    Recommendations for Other Services       Precautions / Restrictions Precautions Precautions: Fall Restrictions Weight Bearing Restrictions: No      Mobility  Bed Mobility               General bed mobility comments: Up in chair upon PT arrival.   Transfers Overall transfer level: Needs assistance Equipment used: None Transfers: Sit to/from Stand Sit to Stand: Min assist         General transfer comment: Assist to boost to standing with cues for hand placement. Slow to stand due to pain.  Ambulation/Gait Ambulation/Gait assistance: Min assist Ambulation Distance (Feet): 30 Feet Assistive device: 1 person hand held assist Gait Pattern/deviations: Step-through pattern;Decreased stride length;Trunk flexed;Wide base of support Gait velocity: decreased Gait velocity interpretation: Below normal speed for age/gender General Gait Details: Slow, unsteady gait requiring hand held assist for support and Min A for balance; holding IV pole in other hand. Limited by  pain.  Stairs            Wheelchair Mobility    Modified Rankin (Stroke Patients Only)       Balance Overall balance assessment: Needs assistance Sitting-balance support: Feet supported;Single extremity supported Sitting balance-Leahy Scale: Fair Sitting balance - Comments: Requires UE support as position of comfort due to pain.   Standing balance support: During functional activity;Single extremity supported Standing balance-Leahy Scale: Poor Standing balance comment: Reliant on UE support and Min A for dynamic standing.                             Pertinent Vitals/Pain Pain Assessment: 0-10 Pain Score: 8  (8.5) Pain Location: incision Pain Descriptors / Indicators: Sore;Operative site guarding;Aching;Grimacing;Guarding Pain Intervention(s): Monitored during session;Repositioned;Limited activity within patient's tolerance;Patient requesting pain meds-RN notified    Home Living Family/patient expects to be discharged to:: Private residence Living Arrangements: Children Available Help at Discharge: Family;Available 24 hours/day Type of Home: House Home Access: Stairs to enter Entrance Stairs-Rails: Doctor, general practiceight;Left Entrance Stairs-Number of Steps: 5 Home Layout: One level Home Equipment: Walker - 2 wheels;Shower seat;Grab bars - tub/shower      Prior Function Level of Independence: Independent         Comments: pt enjoys baking and gardening. Son will be staying with her at d/c. Having a hard time getting around since the last surgery     Hand Dominance   Dominant Hand: Right    Extremity/Trunk Assessment   Upper Extremity Assessment: Defer to OT evaluation           Lower Extremity Assessment: Generalized weakness  Communication   Communication: No difficulties  Cognition Arousal/Alertness: Awake/alert Behavior During Therapy: WFL for tasks assessed/performed Overall Cognitive Status: Within Functional Limits for tasks  assessed                      General Comments      Exercises     Assessment/Plan    PT Assessment Patient needs continued PT services  PT Problem List Decreased strength;Decreased mobility;Decreased balance;Decreased activity tolerance;Pain          PT Treatment Interventions DME instruction;Therapeutic activities;Gait training;Therapeutic exercise;Patient/family education;Functional mobility training;Stair training;Balance training    PT Goals (Current goals can be found in the Care Plan section)  Acute Rehab PT Goals Patient Stated Goal: to make this pain ease off PT Goal Formulation: With patient Time For Goal Achievement: 11/01/16 Potential to Achieve Goals: Good    Frequency Min 3X/week   Barriers to discharge Inaccessible home environment stairs to enter home    Co-evaluation               End of Session Equipment Utilized During Treatment: Gait belt Activity Tolerance: Patient limited by pain Patient left: in chair;with call bell/phone within reach Nurse Communication: Mobility status         Time: [id number] PT Time Calculation (min) (ACUTE ONLY): 20 min   Charges:   PT Evaluation $PT Eval Moderate Complexity: 1 Procedure     PT G Codes:        Nicole Dean 10/18/2016, 11:37 AM  Mylo RedShauna Barbra Miner, PT, DPT [phone]

## 2016-10-18 NOTE — Progress Notes (Signed)
Patient and belongings transferred to 6N05 via bed. VSS.  Daughter notified of transfer.  CCMD and Elink notified.

## 2016-10-19 LAB — CBC
HCT: 29.6 % — ABNORMAL LOW (ref 36.0–46.0)
HEMOGLOBIN: 9.3 g/dL — AB (ref 12.0–15.0)
MCH: 27.1 pg (ref 26.0–34.0)
MCHC: 31.4 g/dL (ref 30.0–36.0)
MCV: 86.3 fL (ref 78.0–100.0)
Platelets: 292 10*3/uL (ref 150–400)
RBC: 3.43 MIL/uL — AB (ref 3.87–5.11)
RDW: 17.3 % — ABNORMAL HIGH (ref 11.5–15.5)
WBC: 10.8 10*3/uL — ABNORMAL HIGH (ref 4.0–10.5)

## 2016-10-19 LAB — BASIC METABOLIC PANEL
ANION GAP: 6 (ref 5–15)
BUN: <5 mg/dL — ABNORMAL LOW (ref 6–20)
CHLORIDE: 110 mmol/L (ref 101–111)
CO2: 21 mmol/L — ABNORMAL LOW (ref 22–32)
Calcium: 8.2 mg/dL — ABNORMAL LOW (ref 8.9–10.3)
Creatinine, Ser: 0.46 mg/dL (ref 0.44–1.00)
GFR calc non Af Amer: >60 mL/min (ref >60)
Glucose, Bld: 95 mg/dL (ref 65–99)
POTASSIUM: 3.6 mmol/L (ref 3.5–5.1)
SODIUM: 137 mmol/L (ref 135–145)

## 2016-10-19 MED ORDER — IBUPROFEN 400 MG PO TABS
400.0000 mg | ORAL_TABLET | Freq: Four times a day (QID) | ORAL | Status: DC | PRN
  Administered 2016-10-20: 400 mg via ORAL
  Filled 2016-10-19: qty 1

## 2016-10-19 MED ORDER — TRAMADOL HCL 50 MG PO TABS
50.0000 mg | ORAL_TABLET | ORAL | Status: DC | PRN
  Administered 2016-10-19 – 2016-10-22 (×4): 100 mg via ORAL
  Filled 2016-10-19 (×4): qty 2

## 2016-10-19 MED ORDER — ACETAMINOPHEN 325 MG PO TABS
650.0000 mg | ORAL_TABLET | Freq: Four times a day (QID) | ORAL | Status: DC
  Administered 2016-10-19 – 2016-10-23 (×13): 650 mg via ORAL
  Filled 2016-10-19 (×14): qty 2

## 2016-10-19 NOTE — Progress Notes (Signed)
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 CT abdomen and pelvis done in ED showed features concerning for cholecystitis with possible fistula. General surgery and cardiology were consulted. Cardiology cleared patient and patient underwent diagnostic laparoscopy with lysis of adhesions, conversion to open cholecystectomy 10/15/16. Postop transferred to stepdown unit. Improving.  Assessment & Plan Acute cholecystitis with cholelithiasis, s/p cholecystectomy 11/15 - s/p diagnostic laparoscopy with lysis of adhesions, conversion to open cholecystectomy for acute gangrenous gallbladder, empyema of gallbladder and severe adhesions, post op day #4, draining output 140 mL yesterday and today 20 mL - Management per general surgery - Gallbladder/abscess culture results, E. Coli resistant to Cipro, Proteus, ABX changed to Rocephin 10/17/2016 - pt overall better, diet advanced to clears and so far pt tolerating well   Post op ileus - still no BM and likely from narcotics - keep on bowel regimen   Post op fever  - noted 11/17 AM T up to 100.3 F, with HR up to 101 and RR > 22, WBC up from 14 --> 18 (11/16) - please note this could have been related to the above, gallbladder cultures with E. Coli but now noted to be resistant to Cipro and ABX already change to Rocephin  - no fevers in the past 48 hours   CAD status post CABG - No chest pain reported. Cardiology input appreciated and have cleared her for surgery.  - Resumed reduced dose carvedilol 3.125 mg BID postop on 11/16, this was stopped due to low BP - may resume in AM based on BP control   Brief episode of slurred speech 11/16 and hypotension -  resolved quickly - pt now back to her baseline - MRI brain notable for remote hx of old, small left cerebellar infarct but no sings of acute infarction  - suspect all related to hypotension but still needs close monitoring  Status post AAA repair August 2017 - outpatient follow up  Essential hypertension - with episodes of hypotension last night 11/16 - off antihypertensives for now, SBP in 120's this AM - monitor closely with no plan to resume Coreg yet   Hypokalemia - supplemented and WNL this AM - BMP In AM  Anemia of chronic disease  - slight drop in Hg since admission but no signs of active bleeding - Hg overall stable  - CBC in AM - currently no indication for transfusion   Severe PCM - nutritionist consulted - Boost Breeze PO TID requested   DVT prophylaxis: Lovenox SQ Code Status: Full Family Communication: None at bedside Disposition Plan: DC home when medically improved and when surgery team clears.  Consultants:   General surgery  Cardiology   Procedures:   Diagnostic laparoscopy with lysis of adhesions, conversion to open cholecystectomy 11/15  Antimicrobials:   IV Cipro 11/12 --> 11/17, changed to Rocephin 11/17 based on sensitivity report -->  IV metronidazole 11/12 -->   Subjective: Denies chest pain, no abd concerns. No BM over 72 hours.   Objective:  Vitals:   10/18/16 0810 10/18/16 1400 10/18/16 2109 10/19/16 0534  BP: 122/63 109/63 (!) 106/53 (!) 103/48  Pulse: 100 93 (!) 104 95  Resp: 18 18 18 17   Temp: 97.6 F (36.4  C) 97.6 F (36.4 C) 98.7 F (37.1 C) 98 F (36.7 C)  TempSrc: Oral Oral Oral Oral  SpO2: 96% 100% 94% 92%  Weight:      Height:        Intake/Output Summary (Last 24 hours) at 10/19/16 1239 Last data filed at 10/19/16 0800  Gross per 24 hour  Intake              850 ml  Output              770 ml  Net               80 ml   Filed Weights   10/14/16 0136 10/15/16 2045  Weight: 54.7 kg (120 lb 11.2 oz) 55.5 kg  (122 lb 5.7 oz)    Examination:  General exam: Pleasant elderly female lying comfortably propped up in bed. Respiratory system: Clear to auscultation. Respiratory effort normal. Cardiovascular system: S1 & S2 heard, RRR. No JVD, murmurs, rubs, gallops or clicks. No pedal edema. Gastrointestinal system: Abdomen: Surgical site dressing is clean and dry. Appropriate tenderness in upper abdomen without peritoneal signs. No organomegaly or masses felt. Normal bowel sounds heard. Central nervous system: Alert and oriented. No focal neurological deficits.  Data Reviewed: I have personally reviewed following labs and imaging studies  CBC:  Recent Labs Lab 10/14/16 0310  10/15/16 1655 10/16/16 0426 10/16/16 2035 10/17/16 1638 10/18/16 0359 10/19/16 0525  WBC 7.5  < > 19.7* 14.1* 18.5*  --  10.0 10.8*  NEUTROABS 4.6  --   --   --   --   --   --   --   HGB 9.9*  < > 11.1* 10.1* 8.1* 9.7* 9.2* 9.3*  HCT 31.5*  < > 35.0* 32.7* 25.6* 30.4* 28.8* 29.6*  MCV 84.7  < > 86.0 84.9 85.9  --  87.0 86.3  PLT 296  < > 375 350 287  --  255 292  < > = values in this interval not displayed. Basic Metabolic Panel:  Recent Labs Lab 10/15/16 1655 10/16/16 0426 10/17/16 0222 10/18/16 0359 10/19/16 0525  NA 136 136 138 138 137  K 4.2 4.3 4.1 3.3* 3.6  CL 105 106 113* 111 110  CO2 22 24 21* 20* 21*  GLUCOSE 181* 131* 103* 101* 95  BUN 6 6 6  <5* <5*  CREATININE 0.58 0.48 0.50 0.36* 0.46  CALCIUM 8.8* 8.5* 7.5* 7.9* 8.2*   Liver Function Tests:  Recent Labs Lab 10/13/16 1627 10/14/16 0310 10/15/16 0330 10/16/16 0426 10/17/16 0222  AST 25 19 21  42* 26  ALT 10* 8* 8* 16 12*  ALKPHOS 100 74 79 69 56  BILITOT 0.3 0.3 0.5 0.6 0.3  PROT 6.8 5.6* 5.6* 5.1* 3.8*  ALBUMIN 3.5 2.6* 2.7* 2.4* 1.8*    Recent Labs Lab 10/13/16 1627  LIPASE 41   Sepsis Labs:  Recent Labs Lab 10/17/16 0222  LATICACIDVEN 1.8    Recent Results (from the past 240 hour(s))  Surgical PCR screen     Status:  None   Collection Time: 10/15/16  4:35 AM  Result Value Ref Range Status   MRSA, PCR NEGATIVE NEGATIVE Final   Staphylococcus aureus NEGATIVE NEGATIVE Final  Aerobic/Anaerobic Culture (surgical/deep wound)     Status: None (Preliminary result)   Collection Time: 10/15/16  1:50 PM  Result Value Ref Range Status   Specimen Description ABSCESS  Final   Special Requests GALL BLADDER  Final   Gram Stain  Final    MODERATE WBC PRESENT,BOTH PMN AND MONONUCLEAR FEW GRAM NEGATIVE RODS FEW GRAM POSITIVE COCCI IN CHAINS    Culture   Final    MODERATE ESCHERICHIA COLI MODERATE PROTEUS MIRABILIS SUSCEPTIBILITIES TO FOLLOW    Report Status PENDING  Incomplete   Organism ID, Bacteria ESCHERICHIA COLI  Final      Susceptibility   Escherichia coli - MIC*    AMPICILLIN >=32 RESISTANT Resistant     CEFAZOLIN <=4 SENSITIVE Sensitive     CEFEPIME <=1 SENSITIVE Sensitive     CEFTAZIDIME <=1 SENSITIVE Sensitive     CEFTRIAXONE <=1 SENSITIVE Sensitive     CIPROFLOXACIN >=4 RESISTANT Resistant     GENTAMICIN <=1 SENSITIVE Sensitive     IMIPENEM <=0.25 SENSITIVE Sensitive     TRIMETH/SULFA <=20 SENSITIVE Sensitive     AMPICILLIN/SULBACTAM 16 INTERMEDIATE Intermediate     PIP/TAZO <=4 SENSITIVE Sensitive     Extended ESBL NEGATIVE Sensitive     * MODERATE ESCHERICHIA COLI       Radiology Studies: No results found. Scheduled Meds: . sodium chloride   Intravenous Once  . sodium chloride   Intravenous Once  . acetaminophen  650 mg Oral Q6H  . aspirin  325 mg Oral Daily  . atorvastatin  40 mg Oral q1800  . cefTRIAXone (ROCEPHIN)  IV  1 g Intravenous Q24H  . Chlorhexidine Gluconate Cloth  6 each Topical Once  . enoxaparin (LOVENOX) injection  40 mg Subcutaneous Q24H  . feeding supplement  1 Container Oral TID BM  . LORazepam  0.5 mg Oral QHS  . mouth rinse  15 mL Mouth Rinse BID  . metronidazole  500 mg Intravenous Q8H  . pantoprazole  40 mg Oral Daily  . senna  1 tablet Oral BID  .  sertraline  100 mg Oral Q breakfast   Continuous Infusions:   LOS: 5 days    Debbora PrestoMAGICK-MYERS, ISKRA, MD [facility] Pager [phone]  If 7PM-7AM, please contact night-coverage [email or web address] Password [email or web address] 10/19/2016, 12:39 PM

## 2016-10-19 NOTE — Progress Notes (Signed)
4 Days Post-Op  Subjective: Biggest complaint is pain she still feels like it is not controlled. She has not had a Bm, BS are hyperactive also.  50 mg oxycodone yesterday.   Objective: Vital signs in last 24 hours: Temp:  [97.6 F (36.4 C)-98.7 F (37.1 C)] 98 F (36.7 C) (11/19 0534) Pulse Rate:  [93-104] 95 (11/19 0534) Resp:  [17-18] 17 (11/19 0534) BP: (103-109)/(48-63) 103/48 (11/19 0534) SpO2:  [92 %-100 %] 92 % (11/19 0534) Last BM Date: 10/15/16 720 PO Urine 800 Drain 140 Afebrile, VSS Labs OK, WBC 10.8, H/H is stable Intake/Output from previous day: 11/18 0701 - 11/19 0700 In: 870 [P.O.:720; IV Piggyback:150] Out: 940 [Urine:800; Drains:140] Intake/Output this shift: Total I/O In: 100 [P.O.:100] Out: 20 [Drains:20]  General appearance: alert, cooperative and no distress Resp: clear to auscultation bilaterally GI: soft, still distended some, BS hyperactive, tolerating diet, but no BM. mild lower extremity edema  Lab Results:   Recent Labs  10/18/16 0359 10/19/16 0525  WBC 10.0 10.8*  HGB 9.2* 9.3*  HCT 28.8* 29.6*  PLT 255 292    BMET  Recent Labs  10/18/16 0359 10/19/16 0525  NA 138 137  K 3.3* 3.6  CL 111 110  CO2 20* 21*  GLUCOSE 101* 95  BUN <5* <5*  CREATININE 0.36* 0.46  CALCIUM 7.9* 8.2*   PT/INR No results for input(s): LABPROT, INR in the last 72 hours.   Recent Labs Lab 10/13/16 1627 10/14/16 0310 10/15/16 0330 10/16/16 0426 10/17/16 0222  AST 25 19 21  42* 26  ALT 10* 8* 8* 16 12*  ALKPHOS 100 74 79 69 56  BILITOT 0.3 0.3 0.5 0.6 0.3  PROT 6.8 5.6* 5.6* 5.1* 3.8*  ALBUMIN 3.5 2.6* 2.7* 2.4* 1.8*     Lipase     Component Value Date/Time   LIPASE 41 10/13/2016 1627     Studies/Results: No results found.  Medications: . sodium chloride   Intravenous Once  . sodium chloride   Intravenous Once  . aspirin  325 mg Oral Daily  . atorvastatin  40 mg Oral q1800  . cefTRIAXone (ROCEPHIN)  IV  1 g Intravenous Q24H   . Chlorhexidine Gluconate Cloth  6 each Topical Once  . enoxaparin (LOVENOX) injection  40 mg Subcutaneous Q24H  . feeding supplement  1 Container Oral TID BM  . LORazepam  0.5 mg Oral QHS  . mouth rinse  15 mL Mouth Rinse BID  . metronidazole  500 mg Intravenous Q8H  . pantoprazole  40 mg Oral Daily  . senna  1 tablet Oral BID  . sertraline  100 mg Oral Q breakfast     Assessment/Plan Acute gangrenous gallbladder, empyema of gallbladder, severe adhesions S/p Diagnostic laparoscopy with lysis of adhesions, conversion to open cholecystectomy, 10/15/16, Dr. Claud KelpHaywood Ingram  CAD status post CABG 04/04/16 Status post AAA repair 07/22/16 HTN Anemia - Hg 10.1 today, continue to monitor Hypokalemia - resolved ID - IV Cipro 11/12  - 11/16 completed >>, IV metronidazole 11/12  Day 7, Ceftriaxone 10/17/16 >> day 3 FEN - Full liquids/No IV fluids VTE - lovenox  Plan:  Add Tylenol q 6h, add ibuprofen(normal creatinine), PT, increase mobilization.    LOS: 5 days    Nicole Dean 10/19/2016 [phone]

## 2016-10-20 LAB — CBC
HEMATOCRIT: 29.4 % — AB (ref 36.0–46.0)
Hemoglobin: 9.4 g/dL — ABNORMAL LOW (ref 12.0–15.0)
MCH: 27.8 pg (ref 26.0–34.0)
MCHC: 32 g/dL (ref 30.0–36.0)
MCV: 87 fL (ref 78.0–100.0)
PLATELETS: 303 10*3/uL (ref 150–400)
RBC: 3.38 MIL/uL — ABNORMAL LOW (ref 3.87–5.11)
RDW: 17.4 % — AB (ref 11.5–15.5)
WBC: 9 10*3/uL (ref 4.0–10.5)

## 2016-10-20 LAB — COMPREHENSIVE METABOLIC PANEL
ALT: 9 U/L — ABNORMAL LOW (ref 14–54)
AST: 13 U/L — ABNORMAL LOW (ref 15–41)
Albumin: 1.8 g/dL — ABNORMAL LOW (ref 3.5–5.0)
Alkaline Phosphatase: 56 U/L (ref 38–126)
Anion gap: 7 (ref 5–15)
BILIRUBIN TOTAL: 0.5 mg/dL (ref 0.3–1.2)
CHLORIDE: 108 mmol/L (ref 101–111)
CO2: 22 mmol/L (ref 22–32)
Calcium: 7.8 mg/dL — ABNORMAL LOW (ref 8.9–10.3)
Creatinine, Ser: 0.47 mg/dL (ref 0.44–1.00)
Glucose, Bld: 87 mg/dL (ref 65–99)
POTASSIUM: 3.4 mmol/L — AB (ref 3.5–5.1)
Sodium: 137 mmol/L (ref 135–145)
TOTAL PROTEIN: 4.1 g/dL — AB (ref 6.5–8.1)

## 2016-10-20 MED ORDER — HYDROMORPHONE HCL 2 MG/ML IJ SOLN: 1.0000 mg | INTRAMUSCULAR | Status: DC | PRN

## 2016-10-20 MED ORDER — KCL IN DEXTROSE-NACL 20-5-0.9 MEQ/L-%-% IV SOLN
INTRAVENOUS | Status: DC
  Administered 2016-10-20: 12:00:00 via INTRAVENOUS
  Filled 2016-10-20 (×3): qty 1000

## 2016-10-20 NOTE — Progress Notes (Addendum)
PROGRESS NOTE  Nicole GivensJanice D Dean  [id number] DOB: Nov 22, 1941  DOA: 10/13/2016  [facility]   Brief Narrative:  75 year old female with a PMH of CAD status post CABG May 2017, AAA repair August 2017, anxiety, depression, GERD, HLD, HTN, presented to ED with worsening RUQ abdominal pain of 4 days' duration. CT abdomen and pelvis done in ED showed features concerning for cholecystitis with possible fistula. General surgery and cardiology were consulted. Cardiology cleared patient and patient underwent diagnostic laparoscopy with lysis of adhesions, conversion to open cholecystectomy 10/15/16. Postop transferred to stepdown unit. Improving.  Assessment & Plan Acute cholecystitis with cholelithiasis, s/p cholecystectomy 11/15 - s/p diagnostic laparoscopy with lysis of adhesions, conversion to open cholecystectomy for acute gangrenous gallbladder, empyema of gallbladder and severe adhesions, post op day #5 - Management per general surgery - Gallbladder/abscess culture results, E. Coli resistant to Cipro, Proteus, ABX changed to Rocephin 10/17/2016 - pt overall better, diet advanced to clears and so far pt tolerating well  - had BM yesterday, none this AM - may be able to advance diet if surgery team ok   Post op ileus - from narcotics, had BM yesterday but not this AM - keep on bowel regimen   Post op fever  - noted 11/17 AM T up to 100.3 F, with HR up to 101 and RR > 22, WBC up from 14 --> 18 (11/16) - please note this could have been related to the above, gallbladder cultures with E. Coli but now noted to be resistant to Cipro and ABX already change to Rocephin  - no fevers in the past 48 hours   CAD status post CABG - No chest pain reported. Cardiology input appreciated and have cleared her for surgery.  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 2017 - outpatient follow up  Essential hypertension - with episodes of hypotension last night 11/16 - off antihypertensives for now, SBP in 100's this AM - monitor closely with no plan to resume Coreg yet   Hypokalemia - supplement and WNL this AM - BMP In AM  Anemia of chronic disease  - slight drop in Hg since admission but no signs of active bleeding - Hg overall stable  - CBC in AM - currently no indication for transfusion   Severe PCM - nutritionist consulted - Boost Breeze PO TID requested   DVT prophylaxis: Lovenox SQ Code Status: Full Family Communication: None at bedside Disposition Plan: DC home when medically improved and when surgery team clears.  Consultants:   General surgery  Cardiology   Procedures:   Diagnostic laparoscopy with lysis of adhesions, conversion to open cholecystectomy 11/15  Antimicrobials:   IV Cipro 11/12 --> 11/17, changed to Rocephin 11/17 based on sensitivity report -->  IV metronidazole 11/12 -->   Subjective: Denies chest pain, no abd concerns. No BM over 72 hours.   Objective:  Vitals:   10/19/16 0534 10/19/16 1755 10/19/16 2129 10/20/16 0527  BP: (!) 103/48 117/62 125/61 (!) 106/53  Pulse: 95 95 96 93  Resp: 17  18 19   Temp: 98 F (36.7 C) 98.3 F (36.8 C) 97.9 F (36.6 C) 98.1 F (36.7 C)  TempSrc: Oral Oral Oral Oral  SpO2: 92% 97% 98% 98%  Weight:      Height:        Intake/Output Summary (Last 24 hours) at 10/20/16 1059 Last data filed at 10/20/16 0945  Gross per 24 hour  Intake             1090 ml  Output              871 ml  Net              219 ml   Filed Weights   10/14/16 0136 10/15/16 2045  Weight: 54.7 kg  (120 lb 11.2 oz) 55.5 kg (122 lb 5.7 oz)    Examination:  General exam: Pleasant elderly female lying comfortably propped up in bed. Respiratory system: Clear to auscultation. Respiratory effort normal. Cardiovascular system: S1 & S2 heard, RRR. No JVD, murmurs, rubs, gallops or clicks. No pedal edema. Gastrointestinal system: Abdomen: Surgical site dressing is clean and dry. Appropriate tenderness in upper abdomen without peritoneal signs. No organomegaly or masses felt. Normal bowel sounds heard. Central nervous system: Alert and oriented. No focal neurological deficits.  Data Reviewed: I have personally reviewed following labs and imaging studies  CBC:  Recent Labs Lab 10/14/16 0310  10/16/16 0426 10/16/16 2035 10/17/16 1638 10/18/16 0359 10/19/16 0525 10/20/16 0546  WBC 7.5  < > 14.1* 18.5*  --  10.0 10.8* 9.0  NEUTROABS 4.6  --   --   --   --   --   --   --   HGB 9.9*  < > 10.1* 8.1* 9.7* 9.2* 9.3* 9.4*  HCT 31.5*  < > 32.7* 25.6* 30.4* 28.8* 29.6* 29.4*  MCV 84.7  < > 84.9 85.9  --  87.0 86.3 87.0  PLT 296  < > 350 287  --  255 292 303  < > = values in this interval not displayed. Basic Metabolic Panel:  Recent Labs Lab 10/16/16 0426 10/17/16 0222 10/18/16 0359 10/19/16 0525 10/20/16 0546  NA 136 138 138 137 137  K 4.3 4.1 3.3* 3.6 3.4*  CL 106 113* 111 110 108  CO2 24 21* 20* 21* 22  GLUCOSE 131* 103* 101* 95 87  BUN 6 6 <5* <5* <5*  CREATININE 0.48 0.50 0.36* 0.46 0.47  CALCIUM 8.5* 7.5* 7.9* 8.2* 7.8*   Liver Function Tests:  Recent Labs Lab 10/14/16 0310 10/15/16 0330 10/16/16 0426 10/17/16 0222 10/20/16 0546  AST 19 21 42* 26 13*  ALT 8* 8* 16 12* 9*  ALKPHOS 74 79 69 56 56  BILITOT 0.3 0.5 0.6 0.3 0.5  PROT 5.6* 5.6* 5.1* 3.8* 4.1*  ALBUMIN 2.6* 2.7* 2.4* 1.8* 1.8*    Recent Labs Lab 10/13/16 1627  LIPASE 41   Sepsis Labs:  Recent Labs Lab 10/17/16 0222  LATICACIDVEN 1.8    Recent Results (from the past 240 hour(s))  Surgical  PCR screen     Status: None   Collection Time: 10/15/16  4:35 AM  Result Value Ref Range Status   MRSA, PCR NEGATIVE NEGATIVE Final   Staphylococcus aureus NEGATIVE NEGATIVE Final  Aerobic/Anaerobic Culture (surgical/deep wound)     Status: None (Preliminary result)   Collection Time: 10/15/16  1:50 PM  Result Value Ref Range Status   Specimen Description ABSCESS  Final   Special Requests  GALL BLADDER  Final   Gram Stain   Final    MODERATE WBC PRESENT,BOTH PMN AND MONONUCLEAR FEW GRAM NEGATIVE RODS FEW GRAM POSITIVE COCCI IN CHAINS    Culture   Final    MODERATE ESCHERICHIA COLI MODERATE PROTEUS MIRABILIS SUSCEPTIBILITIES TO FOLLOW    Report Status PENDING  Incomplete   Organism ID, Bacteria ESCHERICHIA COLI  Final      Susceptibility   Escherichia coli - MIC*    AMPICILLIN >=32 RESISTANT Resistant     CEFAZOLIN <=4 SENSITIVE Sensitive     CEFEPIME <=1 SENSITIVE Sensitive     CEFTAZIDIME <=1 SENSITIVE Sensitive     CEFTRIAXONE <=1 SENSITIVE Sensitive     CIPROFLOXACIN >=4 RESISTANT Resistant     GENTAMICIN <=1 SENSITIVE Sensitive     IMIPENEM <=0.25 SENSITIVE Sensitive     TRIMETH/SULFA <=20 SENSITIVE Sensitive     AMPICILLIN/SULBACTAM 16 INTERMEDIATE Intermediate     PIP/TAZO <=4 SENSITIVE Sensitive     Extended ESBL NEGATIVE Sensitive     * MODERATE ESCHERICHIA COLI       Radiology Studies: No results found. Scheduled Meds: . sodium chloride   Intravenous Once  . sodium chloride   Intravenous Once  . acetaminophen  650 mg Oral Q6H  . aspirin  325 mg Oral Daily  . atorvastatin  40 mg Oral q1800  . cefTRIAXone (ROCEPHIN)  IV  1 g Intravenous Q24H  . Chlorhexidine Gluconate Cloth  6 each Topical Once  . enoxaparin (LOVENOX) injection  40 mg Subcutaneous Q24H  . feeding supplement  1 Container Oral TID BM  . LORazepam  0.5 mg Oral QHS  . mouth rinse  15 mL Mouth Rinse BID  . metronidazole  500 mg Intravenous Q8H  . pantoprazole  40 mg Oral Daily  . senna  1  tablet Oral BID  . sertraline  100 mg Oral Q breakfast   Continuous Infusions:   LOS: 6 days    Debbora PrestoMAGICK-Tam Delisle, MD [facility] Pager [phone]  If 7PM-7AM, please contact night-coverage [email or web address] Password [email or web address] 10/20/2016, 10:59 AM

## 2016-10-20 NOTE — Progress Notes (Signed)
Physical Therapy Treatment Patient Details Name: Nicole Dean MRN: [id number] DOB: [birth year] Today's Date: 10/20/2016    History of Present Illness Patient is a 75 y/o female with hx of anxiety, CAD, HTN, HLD, AAA repair, depression presents s/p diagnostic laparoscopy with lysis of adhesions with conversion to open cholecystectomy 10/15/16    PT Comments    Pt demonstrates improved gait distance and speed with use of RW this session. Pt has been performing short distance gait with HHA with RN to bathroom, but will benefit from use of RW for safety once returning home. Initiated seated le exercises. Pt is limited by pain with gait distance but was able to achieve 150' this session.   Follow Up Recommendations  Home health PT;Supervision for mobility/OOB     Equipment Recommendations  None recommended by PT    Recommendations for Other Services       Precautions / Restrictions Precautions Precautions: Fall Restrictions Weight Bearing Restrictions: No    Mobility  Bed Mobility                  Transfers Overall transfer level: Needs assistance Equipment used: Rolling walker (2 wheeled) Transfers: Sit to/from Stand Sit to Stand: Min guard         General transfer comment: Min guard for safety, able to perfrom standing with proper hand placement  Ambulation/Gait Ambulation/Gait assistance: Min guard Ambulation Distance (Feet): 150 Feet Assistive device: Rolling walker (2 wheeled) Gait Pattern/deviations: Step-through pattern;Wide base of support Gait velocity: decreased Gait velocity interpretation: Below normal speed for age/gender General Gait Details: slow gait with RW with cues for proper hand placement on RW   Stairs            Wheelchair Mobility    Modified Rankin (Stroke Patients Only)       Balance                                    Cognition Arousal/Alertness: Awake/alert Behavior During Therapy: WFL for tasks  assessed/performed Overall Cognitive Status: Within Functional Limits for tasks assessed                      Exercises Total Joint Exercises Ankle Circles/Pumps: AROM;Both;20 reps;Seated Long Arc Quad: AROM;Both;10 reps;Supine    General Comments        Pertinent Vitals/Pain Pain Assessment: 0-10 Pain Score: 8  Pain Location: right abdomen with gait Pain Descriptors / Indicators: Aching;Cramping Pain Intervention(s): Monitored during session;Repositioned;Premedicated before session    Home Living                      Prior Function            PT Goals (current goals can now be found in the care plan section) Acute Rehab PT Goals Patient Stated Goal: to make this pain ease off Progress towards PT goals: Progressing toward goals    Frequency    Min 3X/week      PT Plan Current plan remains appropriate    Co-evaluation             End of Session Equipment Utilized During Treatment: Gait belt Activity Tolerance: Patient tolerated treatment well;Patient limited by pain Patient left: in chair;with call bell/phone within reach     Time: 1337-1410 PT Time Calculation (min) (ACUTE ONLY): 33 min  Charges:  $Gait Training: 23-37 mins  G Codes:      Colin BroachSabra M. Laylah Riga PT, DPT  [phone]  10/20/2016, 2:24 PM

## 2016-10-20 NOTE — Care Management Important Message (Signed)
Important Message  Patient Details  Name: Nicole GivensJanice D Amer MRN: [id number] Date of Birth: October 29, 1941   Medicare Important Message Given:  Yes    Rylie [place] Stefan ChurchBratton 10/20/2016, 11:45 AM

## 2016-10-20 NOTE — Progress Notes (Signed)
5 Days Post-Op  Subjective: Rather anxious about going on with diet, she vomited last PM after some really good soup.  Minimal breakfast but willing to try some soft foods.  Sites all look good, drain still looks bilious. Not having much pain with it.  Objective: Vital signs in last 24 hours: Temp:  [97.9 F (36.6 C)-98.3 F (36.8 C)] 98.1 F (36.7 C) (11/20 0527) Pulse Rate:  [93-96] 93 (11/20 0527) Resp:  [18-19] 19 (11/20 0527) BP: (106-125)/(53-62) 106/53 (11/20 0527) SpO2:  [97 %-98 %] 98 % (11/20 0527) Last BM Date: 10/19/16 640 PO 1190 IV 650 urine recorded 1 stool Afebrile, VSS Labs OK Intake/Output from previous day: 11/19 0701 - 11/20 0700 In: 1190 [P.O.:640; IV Piggyback:550] Out: 831 [Urine:650; Drains:180; Stool:1] Intake/Output this shift: Total I/O In: -  Out: 60 [Drains:60]  General appearance: alert, cooperative and no distress Resp: clear to auscultation bilaterally GI: soft, sore, sites OK, drain is still bilious, + BM, BS still high pitched.  Lab Results:   Recent Labs  10/19/16 0525 10/20/16 0546  WBC 10.8* 9.0  HGB 9.3* 9.4*  HCT 29.6* 29.4*  PLT 292 303    BMET  Recent Labs  10/19/16 0525 10/20/16 0546  NA 137 137  K 3.6 3.4*  CL 110 108  CO2 21* 22  GLUCOSE 95 87  BUN <5* <5*  CREATININE 0.46 0.47  CALCIUM 8.2* 7.8*   PT/INR No results for input(s): LABPROT, INR in the last 72 hours.   Recent Labs Lab 10/14/16 0310 10/15/16 0330 10/16/16 0426 10/17/16 0222 10/20/16 0546  AST 19 21 42* 26 13*  ALT 8* 8* 16 12* 9*  ALKPHOS 74 79 69 56 56  BILITOT 0.3 0.5 0.6 0.3 0.5  PROT 5.6* 5.6* 5.1* 3.8* 4.1*  ALBUMIN 2.6* 2.7* 2.4* 1.8* 1.8*     Lipase     Component Value Date/Time   LIPASE 41 10/13/2016 1627     Studies/Results: No results found.  Medications: . sodium chloride   Intravenous Once  . sodium chloride   Intravenous Once  . acetaminophen  650 mg Oral Q6H  . aspirin  325 mg Oral Daily  . atorvastatin   40 mg Oral q1800  . cefTRIAXone (ROCEPHIN)  IV  1 g Intravenous Q24H  . Chlorhexidine Gluconate Cloth  6 each Topical Once  . enoxaparin (LOVENOX) injection  40 mg Subcutaneous Q24H  . feeding supplement  1 Container Oral TID BM  . LORazepam  0.5 mg Oral QHS  . mouth rinse  15 mL Mouth Rinse BID  . metronidazole  500 mg Intravenous Q8H  . pantoprazole  40 mg Oral Daily  . senna  1 tablet Oral BID  . sertraline  100 mg Oral Q breakfast     Assessment/Plan Acute gangrenous gallbladder, empyemaof gallbladder, severe adhesions S/p Diagnostic laparoscopy with lysis of adhesions, conversion to open cholecystectomy, 10/15/16, Dr. 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  PT ordered but have not been in to help her so far.  I will give her some K+ thru the IV    LOS: 6 days  Sherrie GeorgeJENNINGS,Yadir Zentner 10/20/2016 [phone]

## 2016-10-21 ENCOUNTER — Other Ambulatory Visit: Payer: Self-pay | Admitting: *Deleted

## 2016-10-21 DIAGNOSIS — I739 Peripheral vascular disease, unspecified: Secondary | ICD-10-CM

## 2016-10-21 LAB — BASIC METABOLIC PANEL
Anion gap: 6 (ref 5–15)
CALCIUM: 7.5 mg/dL — AB (ref 8.9–10.3)
CO2: 22 mmol/L (ref 22–32)
Chloride: 110 mmol/L (ref 101–111)
Creatinine, Ser: 0.36 mg/dL — ABNORMAL LOW (ref 0.44–1.00)
GFR calc Af Amer: >60 mL/min (ref >60)
GLUCOSE: 102 mg/dL — AB (ref 65–99)
Potassium: 3 mmol/L — ABNORMAL LOW (ref 3.5–5.1)
Sodium: 138 mmol/L (ref 135–145)

## 2016-10-21 LAB — CBC
HEMATOCRIT: 29.4 % — AB (ref 36.0–46.0)
Hemoglobin: 9.5 g/dL — ABNORMAL LOW (ref 12.0–15.0)
MCH: 28 pg (ref 26.0–34.0)
MCHC: 32.3 g/dL (ref 30.0–36.0)
MCV: 86.7 fL (ref 78.0–100.0)
PLATELETS: 312 10*3/uL (ref 150–400)
RBC: 3.39 MIL/uL — ABNORMAL LOW (ref 3.87–5.11)
RDW: 17.7 % — AB (ref 11.5–15.5)
WBC: 7.8 10*3/uL (ref 4.0–10.5)

## 2016-10-21 LAB — MAGNESIUM: MAGNESIUM: 1.3 mg/dL — AB (ref 1.7–2.4)

## 2016-10-21 MED ORDER — POTASSIUM CHLORIDE CRYS ER 20 MEQ PO TBCR
20.0000 meq | EXTENDED_RELEASE_TABLET | Freq: Two times a day (BID) | ORAL | Status: DC
  Administered 2016-10-21: 20 meq via ORAL
  Filled 2016-10-21: qty 1

## 2016-10-21 MED ORDER — POTASSIUM CHLORIDE CRYS ER 20 MEQ PO TBCR
40.0000 meq | EXTENDED_RELEASE_TABLET | Freq: Once | ORAL | Status: AC
  Administered 2016-10-21: 40 meq via ORAL
  Filled 2016-10-21: qty 2

## 2016-10-21 MED ORDER — FUROSEMIDE 20 MG PO TABS
20.0000 mg | ORAL_TABLET | Freq: Once | ORAL | Status: AC
Start: 2016-10-21 — End: 2016-10-21
  Administered 2016-10-21: 20 mg via ORAL
  Filled 2016-10-21: qty 1

## 2016-10-21 NOTE — Progress Notes (Signed)
6 Days Post-Op  Subjective: Tolerating fulls well now.  + BM, sites look fine. She is going home with children to help. Drain fluid is still green.  Objective: Vital signs in last 24 hours: Temp:  [97.5 F (36.4 C)-99.2 F (37.3 C)] 99.2 F (37.3 C) (11/21 0529) Pulse Rate:  [83-106] 106 (11/21 0529) Resp:  [19] 19 (11/21 0529) BP: (97-128)/(58-65) 128/65 (11/21 0529) SpO2:  [97 %-99 %] 97 % (11/21 0529) Last BM Date: 10/21/16 Drain 130 1600 urine  PO not recorded TM 99.2 K+ 3.0  WBC 7.8   Intake/Output from previous day: 11/20 0701 - 11/21 0700 In: 1250 [I.V.:950; IV Piggyback:300] Out: 1730 [Urine:1600; Drains:130] Intake/Output this shift: Total I/O In: 240 [P.O.:240] Out: 200 [Urine:200]  General appearance: alert, cooperative and no distress Resp: clear to auscultation bilaterally GI: soft sore, sites OK, drainage is green, + BS and BM Extremities: mild lower extremity edema  Lab Results:   Recent Labs  10/20/16 0546 10/21/16 0300  WBC 9.0 7.8  HGB 9.4* 9.5*  HCT 29.4* 29.4*  PLT 303 312    BMET  Recent Labs  10/20/16 0546 10/21/16 0300  NA 137 138  K 3.4* 3.0*  CL 108 110  CO2 22 22  GLUCOSE 87 102*  BUN <5* <5*  CREATININE 0.47 0.36*  CALCIUM 7.8* 7.5*   PT/INR No results for input(s): LABPROT, INR in the last 72 hours.   Recent Labs Lab 10/15/16 0330 10/16/16 0426 10/17/16 0222 10/20/16 0546  AST 21 42* 26 13*  ALT 8* 16 12* 9*  ALKPHOS 79 69 56 56  BILITOT 0.5 0.6 0.3 0.5  PROT 5.6* 5.1* 3.8* 4.1*  ALBUMIN 2.7* 2.4* 1.8* 1.8*     Lipase     Component Value Date/Time   LIPASE 41 10/13/2016 1627     Studies/Results: No results found.  Medications: . sodium chloride   Intravenous Once  . sodium chloride   Intravenous Once  . acetaminophen  650 mg Oral Q6H  . aspirin  325 mg Oral Daily  . atorvastatin  40 mg Oral q1800  . cefTRIAXone (ROCEPHIN)  IV  1 g Intravenous Q24H  . Chlorhexidine Gluconate Cloth  6 each  Topical Once  . enoxaparin (LOVENOX) injection  40 mg Subcutaneous Q24H  . feeding supplement  1 Container Oral TID BM  . LORazepam  0.5 mg Oral QHS  . mouth rinse  15 mL Mouth Rinse BID  . metronidazole  500 mg Intravenous Q8H  . pantoprazole  40 mg Oral Daily  . potassium chloride  40 mEq Oral Once  . senna  1 tablet Oral BID  . sertraline  100 mg Oral Q breakfast   . 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  Saline lock IV replace K+, lasix for extra fluid.recheck labs in AM. Check mag.  Nicole Dean gave on dose of K+ already.  LOS: 7 days    Nicole Dean 10/21/2016 [phone]

## 2016-10-21 NOTE — Progress Notes (Signed)
PROGRESS NOTE  Nicole Dean  ZOX:[id number] DOB: 1941/06/20  DOA: 10/13/2016  PCP: [facility]   Brief Narrative:  75 year old female with a PMH of CAD status post CABG May 2017, AAA repair August 2017, anxiety, depression, GERD, HLD, HTN, presented to ED with worsening RUQ abdominal pain of 4 days' duration. CT abdomen and pelvis done in ED showed features concerning for cholecystitis with possible fistula. General surgery and cardiology were consulted. Cardiology cleared patient and patient underwent diagnostic laparoscopy with lysis of adhesions, conversion to open cholecystectomy 10/15/16. Postop transferred to stepdown unit. Improving.  Assessment & Plan Acute cholecystitis with cholelithiasis, s/p cholecystectomy 11/15 - s/p diagnostic laparoscopy with lysis of adhesions, conversion to open cholecystectomy for acute gangrenous gallbladder, empyema of gallbladder and severe adhesions, post op day #6 - Management per general surgery - Gallbladder/abscess culture results, E. Coli resistant to Cipro, Proteus, ABX changed to Rocephin 10/17/2016 - pt overall better, diet advanced to clears and so far pt tolerating well  - had BM yesterday and this AM - may be able to advance diet if surgery team ok   Post op ileus - from narcotics, had BM yesterday and today, keep on bowel regimen   Post op fever  - noted 11/17 AM T up to 100.3 F, with HR up to 101 and RR > 22, WBC up from 14 --> 18 (11/16) --> 7.8 (WNL this AM) - please note this could have been related to the above, gallbladder cultures with E. Coli but now noted to be resistant to Cipro and ABX already changed to Rocephin  - today is day #5 of effective ABX treatment with Rocephin  - no fevers in the past 48 hours   CAD status post CABG - No chest pain reported. Cardiology input appreciated and have cleared her for surgery.  - Resumed reduced dose carvedilol 3.125 mg BID postop on 11/16, this was  stopped due to low BP - may resume in AM based on BP control, SBP still in low 100's  Brief episode of slurred speech 11/16 and hypotension - resolved quickly - pt now back to her baseline - MRI brain notable for remote hx of old, small left cerebellar infarct but no sings of acute infarction  - suspect all related to hypotension   Status post AAA repair August 2017 - outpatient follow up  Essential hypertension - with episodes of hypotension last night 11/16 - off antihypertensives for now, SBP in 100's this AM - monitor closely with no plan to resume Coreg yet   Hypokalemia - supplement this AM as K Is low at 3 - BMP In AM  Anemia of chronic disease  - slight drop in Hg since admission but no signs of active bleeding - Hg overall stable  - CBC in AM - currently no indication for transfusion   Severe PCM - nutritionist consulted - Boost Breeze PO TID requested   DVT prophylaxis: Lovenox SQ Code Status: Full Family Communication: None at bedside Disposition Plan: DC home when medically improved, tolerating solids, and when surgery team clears,   Consultants:   General surgery  Cardiology   Procedures:   Diagnostic laparoscopy with lysis of adhesions, conversion to open cholecystectomy 11/15  Antimicrobials:   IV Cipro 11/12 --> 11/17, changed to Rocephin 11/17 based on sensitivity report -->  IV metronidazole 11/12 -->   Subjective: Denies chest pain, no abd concerns.   Objective:  Vitals:   10/20/16 0527 10/20/16 1439 10/20/16 2041  10/21/16 0529  BP: (!) 106/53 (!) 97/58 111/60 128/65  Pulse: 93 83 95 (!) 106  Resp: 19  19 19   Temp: 98.1 F (36.7 C) 97.5 F (36.4 C) 98 F (36.7 C) 99.2 F (37.3 C)  TempSrc: Oral Oral Oral Oral  SpO2: 98% 99% 97% 97%  Weight:      Height:        Intake/Output Summary (Last 24 hours) at 10/21/16 1044 Last data filed at 10/21/16 0913  Gross per 24 hour  Intake             1490 ml  Output             1870 ml    Net             -380 ml   Filed Weights   10/14/16 0136 10/15/16 2045  Weight: 54.7 kg (120 lb 11.2 oz) 55.5 kg (122 lb 5.7 oz)    Examination:  General exam: Pleasant elderly female lying comfortably propped up in bed. Respiratory system: Clear to auscultation. Respiratory effort normal. Cardiovascular system: S1 & S2 heard, RRR. No JVD, murmurs, rubs, gallops or clicks. No pedal edema. Gastrointestinal system: Abdomen: Surgical site dressing is clean and dry. Appropriate tenderness in upper abdomen without peritoneal signs. No organomegaly or masses felt. Normal bowel sounds heard. Central nervous system: Alert and oriented. No focal neurological deficits.  Data Reviewed: I have personally reviewed following labs and imaging studies  CBC:  Recent Labs Lab 10/16/16 2035 10/17/16 1638 10/18/16 0359 10/19/16 0525 10/20/16 0546 10/21/16 0300  WBC 18.5*  --  10.0 10.8* 9.0 7.8  HGB 8.1* 9.7* 9.2* 9.3* 9.4* 9.5*  HCT 25.6* 30.4* 28.8* 29.6* 29.4* 29.4*  MCV 85.9  --  87.0 86.3 87.0 86.7  PLT 287  --  255 292 303 312   Basic Metabolic Panel:  Recent Labs Lab 10/17/16 0222 10/18/16 0359 10/19/16 0525 10/20/16 0546 10/21/16 0300  NA 138 138 137 137 138  K 4.1 3.3* 3.6 3.4* 3.0*  CL 113* 111 110 108 110  CO2 21* 20* 21* 22 22  GLUCOSE 103* 101* 95 87 102*  BUN 6 <5* <5* <5* <5*  CREATININE 0.50 0.36* 0.46 0.47 0.36*  CALCIUM 7.5* 7.9* 8.2* 7.8* 7.5*   Liver Function Tests:  Recent Labs Lab 10/15/16 0330 10/16/16 0426 10/17/16 0222 10/20/16 0546  AST 21 42* 26 13*  ALT 8* 16 12* 9*  ALKPHOS 79 69 56 56  BILITOT 0.5 0.6 0.3 0.5  PROT 5.6* 5.1* 3.8* 4.1*  ALBUMIN 2.7* 2.4* 1.8* 1.8*   Sepsis Labs:  Recent Labs Lab 10/17/16 0222  LATICACIDVEN 1.8    Recent Results (from the past 240 hour(s))  Surgical PCR screen     Status: None   Collection Time: 10/15/16  4:35 AM  Result Value Ref Range Status   MRSA, PCR NEGATIVE NEGATIVE Final   Staphylococcus  aureus NEGATIVE NEGATIVE Final  Aerobic/Anaerobic Culture (surgical/deep wound)     Status: None (Preliminary result)   Collection Time: 10/15/16  1:50 PM  Result Value Ref Range Status   Specimen Description ABSCESS  Final   Special Requests GALL BLADDER  Final   Gram Stain   Final    MODERATE WBC PRESENT,BOTH PMN AND MONONUCLEAR FEW GRAM NEGATIVE RODS FEW GRAM POSITIVE COCCI IN CHAINS    Culture   Final    MODERATE ESCHERICHIA COLI MODERATE PROTEUS MIRABILIS SUSCEPTIBILITIES TO FOLLOW    Report Status PENDING  Incomplete  Organism ID, Bacteria ESCHERICHIA COLI  Final      Susceptibility   Escherichia coli - MIC*    AMPICILLIN >=32 RESISTANT Resistant     CEFAZOLIN <=4 SENSITIVE Sensitive     CEFEPIME <=1 SENSITIVE Sensitive     CEFTAZIDIME <=1 SENSITIVE Sensitive     CEFTRIAXONE <=1 SENSITIVE Sensitive     CIPROFLOXACIN >=4 RESISTANT Resistant     GENTAMICIN <=1 SENSITIVE Sensitive     IMIPENEM <=0.25 SENSITIVE Sensitive     TRIMETH/SULFA <=20 SENSITIVE Sensitive     AMPICILLIN/SULBACTAM 16 INTERMEDIATE Intermediate     PIP/TAZO <=4 SENSITIVE Sensitive     Extended ESBL NEGATIVE Sensitive     * MODERATE ESCHERICHIA COLI       Radiology Studies: No results found. Scheduled Meds: . sodium chloride   Intravenous Once  . sodium chloride   Intravenous Once  . acetaminophen  650 mg Oral Q6H  . aspirin  325 mg Oral Daily  . atorvastatin  40 mg Oral q1800  . cefTRIAXone (ROCEPHIN)  IV  1 g Intravenous Q24H  . Chlorhexidine Gluconate Cloth  6 each Topical Once  . enoxaparin (LOVENOX) injection  40 mg Subcutaneous Q24H  . feeding supplement  1 Container Oral TID BM  . LORazepam  0.5 mg Oral QHS  . mouth rinse  15 mL Mouth Rinse BID  . metronidazole  500 mg Intravenous Q8H  . pantoprazole  40 mg Oral Daily  . senna  1 tablet Oral BID  . sertraline  100 mg Oral Q breakfast   Continuous Infusions: . dextrose 5 % and 0.9 % NaCl with KCl 20 mEq/L 50 mL/hr at 10/20/16 1211     LOS: 7 days    Debbora PrestoMAGICK-Akasia Ahmad, MD [facility] Pager [phone]  If 7PM-7AM, please contact night-coverage [email or web address] Password [email or web address] 10/21/2016, 10:44 AM

## 2016-10-21 NOTE — Care Management Note (Signed)
Case Management Note  Patient Details  Name: Nicole Dean MRN: [id number] Date of Birth: [birth year]  Subjective/Objective:                    Action/Plan:  Spoke with patient at bedside. Patient has had home health in past with [facility] and wants them again. Her son is going to stay with her at discharge. Her daughter plans on transporting her home at discharge. She is currently on room air . Expected Discharge Date:                  Expected Discharge Plan:  Home w Home Health Services  In-House Referral:     Discharge planning Services  CM Consult  Post Acute Care Choice:  Home Health Choice offered to:  Patient  DME Arranged:    DME Agency:     HH Arranged:  RN, PT, Nurse's Aide HH Agency:  [facility]  Status of Service:  Completed, signed off  If discussed at Long Length of Stay Meetings, dates discussed:    Additional Comments:  Kingsley PlanWile, Aubery Douthat Marie, RN 10/21/2016, 11:14 AM

## 2016-10-22 ENCOUNTER — Inpatient Hospital Stay (HOSPITAL_COMMUNITY): Admission: RE | Admit: 2016-10-22 | Payer: Medicare HMO | Source: Ambulatory Visit

## 2016-10-22 ENCOUNTER — Ambulatory Visit: Payer: Medicare HMO | Admitting: Vascular Surgery

## 2016-10-22 DIAGNOSIS — E876 Hypokalemia: Secondary | ICD-10-CM

## 2016-10-22 LAB — CBC
HEMATOCRIT: 29 % — AB (ref 36.0–46.0)
HEMOGLOBIN: 9.3 g/dL — AB (ref 12.0–15.0)
MCH: 27.8 pg (ref 26.0–34.0)
MCHC: 32.1 g/dL (ref 30.0–36.0)
MCV: 86.6 fL (ref 78.0–100.0)
Platelets: 335 10*3/uL (ref 150–400)
RBC: 3.35 MIL/uL — ABNORMAL LOW (ref 3.87–5.11)
RDW: 17.4 % — ABNORMAL HIGH (ref 11.5–15.5)
WBC: 7 10*3/uL (ref 4.0–10.5)

## 2016-10-22 LAB — BASIC METABOLIC PANEL
Anion gap: 7 (ref 5–15)
BUN: <5 mg/dL — ABNORMAL LOW (ref 6–20)
CHLORIDE: 108 mmol/L (ref 101–111)
CO2: 24 mmol/L (ref 22–32)
CREATININE: 0.36 mg/dL — AB (ref 0.44–1.00)
Calcium: 7.5 mg/dL — ABNORMAL LOW (ref 8.9–10.3)
GFR calc non Af Amer: >60 mL/min (ref >60)
GLUCOSE: 95 mg/dL (ref 65–99)
Potassium: 3.3 mmol/L — ABNORMAL LOW (ref 3.5–5.1)
Sodium: 139 mmol/L (ref 135–145)

## 2016-10-22 MED ORDER — MAGNESIUM SULFATE 4 GM/100ML IV SOLN
4.0000 g | Freq: Once | INTRAVENOUS | Status: AC
  Administered 2016-10-22: 4 g via INTRAVENOUS
  Filled 2016-10-22: qty 100

## 2016-10-22 MED ORDER — SENNA 8.6 MG PO TABS: 1.0000 | ORAL_TABLET | Freq: Two times a day (BID) | ORAL | 0 refills | Status: AC

## 2016-10-22 MED ORDER — METRONIDAZOLE 500 MG PO TABS
500.0000 mg | ORAL_TABLET | Freq: Three times a day (TID) | ORAL | Status: DC
  Administered 2016-10-22 – 2016-10-23 (×3): 500 mg via ORAL
  Filled 2016-10-22 (×3): qty 1

## 2016-10-22 MED ORDER — ENSURE ENLIVE PO LIQD
237.0000 mL | Freq: Two times a day (BID) | ORAL | Status: DC
  Administered 2016-10-22 – 2016-10-23 (×2): 237 mL via ORAL

## 2016-10-22 MED ORDER — ONDANSETRON 4 MG PO TBDP: 4.0000 mg | ORAL_TABLET | Freq: Three times a day (TID) | ORAL | 0 refills | Status: DC | PRN

## 2016-10-22 MED ORDER — OXYCODONE HCL 5 MG PO TABS: 5.0000 mg | ORAL_TABLET | Freq: Four times a day (QID) | ORAL | 0 refills | Status: DC | PRN

## 2016-10-22 MED ORDER — CARVEDILOL 3.125 MG PO TABS
3.1250 mg | ORAL_TABLET | Freq: Two times a day (BID) | ORAL | Status: DC
  Administered 2016-10-22 – 2016-10-23 (×2): 3.125 mg via ORAL
  Filled 2016-10-22 (×2): qty 1

## 2016-10-22 MED ORDER — CARVEDILOL 6.25 MG PO TABS: 3.1250 mg | ORAL_TABLET | Freq: Two times a day (BID) | ORAL | Status: DC

## 2016-10-22 MED ORDER — LORAZEPAM 0.5 MG PO TABS: 0.5000 mg | ORAL_TABLET | Freq: Every day | ORAL | 0 refills | Status: DC

## 2016-10-22 MED ORDER — POTASSIUM CHLORIDE CRYS ER 20 MEQ PO TBCR
40.0000 meq | EXTENDED_RELEASE_TABLET | Freq: Once | ORAL | Status: AC
  Administered 2016-10-22: 40 meq via ORAL
  Filled 2016-10-22: qty 2

## 2016-10-22 MED ORDER — CEFUROXIME AXETIL 500 MG PO TABS: 500.0000 mg | ORAL_TABLET | Freq: Two times a day (BID) | ORAL | 0 refills | Status: DC

## 2016-10-22 MED ORDER — WHITE PETROLATUM GEL
Status: AC
  Administered 2016-10-22: 09:00:00
  Filled 2016-10-22: qty 1

## 2016-10-22 MED ORDER — ACETAMINOPHEN 325 MG PO TABS: 650.0000 mg | ORAL_TABLET | ORAL | Status: AC | PRN

## 2016-10-22 NOTE — Progress Notes (Signed)
Addendum  Patient had been discharged but a short while ago, she became nauseous and had an episode of nonbloody emesis following which she continued to be nauseous. Patient and family/daughter at bedside concerned about this and preferred not to be discharged tonight, monitor overnight and reassess in a.m. Hence discontinued discharge. Check BMP and magnesium in a.m. Start carvedilol 3.125 MG twice a day as was planned at discharge. Monitor closely. Discussed with RN and advised them to inform surgical team regarding this development.  Marcellus ScottHONGALGI,Kaytlin Burklow, MD, FACP, FHM. [facility] Pager [phone]  If 7PM-7AM, please contact night-coverage [email or web address] Password [email or web address] 10/22/2016, 5:34 PM

## 2016-10-22 NOTE — Progress Notes (Addendum)
Nutrition Follow-up  DOCUMENTATION CODES:   Severe malnutrition in context of acute illness/injury  INTERVENTION:   -D/c Boost Breeze po TID, each supplement provides 250 kcal and 9 grams of protein -Ensure Enlive po BID, each supplement provides 350 kcal and 20 grams of protein  NUTRITION DIAGNOSIS:   Malnutrition related to acute illness as evidenced by percent weight loss, energy intake < or equal to 50% for > or equal to 5 days.  Ongoing  GOAL:   Patient will meet greater than or equal to 90% of their needs  Progressing  MONITOR:   Diet advancement, PO intake, Supplement acceptance, Labs, Weight trends, I & O's  REASON FOR ASSESSMENT:   Consult, Malnutrition Screening Tool Poor PO  ASSESSMENT:   75 y.o. Female with history of CAD status post CABG in May of this year, abdominal aneurysm repair in August of this year following which patient has been having persistent abdominal pain presents to the ER because of acute worsening of abdominal pain mostly in the right upper quadrant with nausea vomiting last 4 days.  Patient s/p procedure 11/15: DIAGNOSTIC LAPAROSCOPY LYSIS OF ADHESIONS CONVERSION TO OPEN CHOLECYSTECTOMY  Pt transferred from SDU to surgical floor on 10/18/16.   Attempted to speak with pt x 2, however, in with MD or receiving drain education with RN at times of visits.   Pt has now been advanced to a soft diet. Intake remains variable; PO: 25-100%. Pt has been refusing Boost Breeze supplements per MAR. RD will modify orders to Ensure Enlive due to diet advancement.   Per RNCM, plan for pt to discharge home today.   Labs reviewed: K: 3.3, Mg: 1.3.  Diet Order:  DIET SOFT Room service appropriate? Yes; Fluid consistency: Thin  Skin:  Reviewed, no issues  Last BM:  10/21/16  Height:   Ht Readings from Last 1 Encounters:  10/15/16 5\' 2"  (1.575 m)    Weight:   Wt Readings from Last 1 Encounters:  10/15/16 122 lb 5.7 oz (55.5 kg)    Ideal  Body Weight:  50 kg  BMI:  Body mass index is 22.38 kg/m.  Estimated Nutritional Needs:   Kcal:  1450-1650  Protein:  70-80 gm  Fluid:  >/= 1.5 L  EDUCATION NEEDS:   No education needs identified at this time  Tinsleigh Slovacek A. Mayford KnifeWilliams, RD, LDN, CDE Pager: [phone] After hours Pager: [phone]

## 2016-10-22 NOTE — Discharge Instructions (Addendum)
[facility]      [facility] °[phone] ° °OPEN ABDOMINAL SURGERY: POST OP INSTRUCTIONS ° °Always review your discharge instruction sheet given to you by the facility where your surgery was performed. ° °IF YOU HAVE DISABILITY OR FAMILY LEAVE FORMS, YOU MUST BRING THEM TO THE OFFICE FOR PROCESSING.  PLEASE DO NOT GIVE THEM TO YOUR DOCTOR. ° °1. A prescription for pain medication may be given to you upon discharge.  Take your pain medication as prescribed, if needed.  If narcotic pain medicine is not needed, then you may take acetaminophen (Tylenol) or ibuprofen (Advil) as needed. °2. Take your usually prescribed medications unless otherwise directed. °3. If you need a refill on your pain medication, please contact your pharmacy. They will contact our office to request authorization.  Prescriptions will not be filled after 5pm or on week-ends. °4. You should follow a light diet the first few days after arrival home, such as soup and crackers, pudding, etc.unless your doctor has advised otherwise. A high-fiber, low fat diet can be resumed as tolerated.   Be sure to include lots of fluids daily. Most patients will experience some swelling and bruising on the chest and neck area.  Ice packs will help.  Swelling and bruising can take several days to resolve °5. Most patients will experience some swelling and bruising in the area of the incision. Ice pack will help. Swelling and bruising can take several days to resolve..  °6. It is common to experience some constipation if taking pain medication after surgery.  Increasing fluid intake and taking a stool softener will usually help or prevent this problem from occurring.  A mild laxative (Milk of Magnesia or Miralax) should be taken according to package directions if there are no bowel movements after 48 hours. °7.  You may have steri-strips (small skin tapes) in place directly over the incision.  These strips should be left on the skin for 7-10 days.  If your  surgeon used skin glue on the incision, you may shower in 24 hours.  The glue will flake off over the next 2-3 weeks.  Any sutures or staples will be removed at the office during your follow-up visit. You may find that a light gauze bandage over your incision may keep your staples from being rubbed or pulled. You may shower and replace the bandage daily. °8. ACTIVITIES:  You may resume regular (light) daily activities beginning the next day--such as daily self-care, walking, climbing stairs--gradually increasing activities as tolerated.  You may have sexual intercourse when it is comfortable.  Refrain from any heavy lifting or straining until approved by your doctor. °a. You may drive when you no longer are taking prescription pain medication, you can comfortably wear a seatbelt, and you can safely maneuver your car and apply brakes °b. Return to Work: ___________________________________ °9. You should see your doctor in the office for a follow-up appointment approximately two weeks after your surgery.  Make sure that you call for this appointment within a day or two after you arrive home to insure a convenient appointment time. °OTHER INSTRUCTIONS:  °_____________________________________________________________ °_____________________________________________________________ ° °WHEN TO CALL YOUR DOCTOR: °1. Fever over 101.0 °2. Inability to urinate °3. Nausea and/or vomiting °4. Extreme swelling or bruising °5. Continued bleeding from incision. °6. Increased pain, redness, or drainage from the incision. °7. Difficulty swallowing or breathing °8. Muscle cramping or spasms. °9. Numbness or tingling in hands or feet or around lips. ° °The clinic staff is available to   answer your questions during regular business hours.  Please dont hesitate to call and ask to speak to one of the nurses if you have concerns.  For further questions, please visit [email or web address]    Surgical Drain Home Care Record  drainage each day, and color of drainage Introduction Surgical drains are used to remove extra fluid that normally builds up in a surgical wound after surgery. A surgical drain helps to heal a surgical wound. Different kinds of surgical drains include:  Active drains. These drains use suction to pull drainage away from the surgical wound. Drainage flows through a tube to a container outside of the body. It is important to keep the bulb or the drainage container flat (compressed) at all times, except while you empty it. Flattening the bulb or container creates suction. The two most common types of active drains are bulb drains and Hemovac drains.  Passive drains. These drains allow fluid to drain naturally, by gravity. Drainage flows through a tube to a bandage (dressing) or a container outside of the body. Passive drains do not need to be emptied. The most common type of passive drain is the Penrose drain. A drain is placed during surgery. Immediately after surgery, drainage is usually bright red and a little thicker than water. The drainage may gradually turn yellow or pink and become thinner. It is likely that your health care provider will remove the drain when the drainage stops or when the amount decreases to 1-2 Tbsp (15-30 mL) during a 24-hour period. How to care for your surgical drain  Keep the skin around the drain dry and covered with a dressing at all times.  Check your drain area every day for signs of infection. Check for:  More redness, swelling, or pain.  Pus or a bad smell.  Cloudy drainage. Follow instructions from your health care provider about how to take care of your drain and how to change your dressing. Change your dressing at least one time every day. Change it more often if needed to keep the dressing dry. Make sure you: 1. Gather your supplies, including:  Tape.  Germ-free cleaning solution (sterile saline).  Split gauze drain sponge: 4 x 4 inches (10 x 10  cm).  Gauze square: 4 x 4 inches (10 x 10 cm). 2. Wash your hands with soap and water before you change your dressing. If soap and water are not available, use hand sanitizer. 3. Remove the old dressing. Avoid using scissors to do that. 4. Use sterile saline to clean your skin around the drain. 5. Place the tube through the slit in a drain sponge. Place the drain sponge so that it covers your wound. 6. Place the gauze square or another drain sponge on top of the drain sponge that is on the wound. Make sure the tube is between those layers. 7. Tape the dressing to your skin. 8. If you have an active bulb or Hemovac drain, tape the drainage tube to your skin 1-2 inches (2.5-5 cm) below the place where the tube enters your body. Taping keeps the tube from pulling on any stitches (sutures) that you have. 9. Wash your hands with soap and water. 10. Write down the color of your drainage and how often you change your dressing. How to empty your active bulb or Hemovac drain 1. Make sure that you have a measuring cup that you can empty your drainage into. 2. Wash your hands with soap and water. If soap and water are not available,  use hand sanitizer. 3. Gently move your fingers down the tube while squeezing very lightly. This is called stripping the tube. This clears any drainage, clots, or tissue from the tube.  Do not pull on the tube.  You may need to strip the tube several times every day to keep the tube clear. 4. Open the bulb cap or the drain plug. Do not touch the inside of the cap or the bottom of the plug. 5. Empty all of the drainage into the measuring cup. 6. Compress the bulb or the container and replace the cap or the plug. To compress the bulb or the container, squeeze it firmly in the middle while you close the cap or plug the container. 7. Write down the amount of drainage that you have in each 24-hour period. If you have less than 2 Tbsp (30 mL) of drainage during 24 hours, contact  your health care provider. 8. Flush the drainage down the toilet. 9. Wash your hands with soap and water. Contact a health care provider if:  You have more redness, swelling, or pain around your drain area.  The amount of drainage that you have is increasing instead of decreasing.  You have pus or a bad smell coming from your drain area.  You have a fever.  You have drainage that is cloudy.  There is a sudden stop or a sudden decrease in the amount of drainage that you have.  Your tube falls out.  Your active draindoes not stay compressedafter you empty it. This information is not intended to replace advice given to you by your health care provider. Make sure you discuss any questions you have with your health care provider. Document Released: 11/14/2000 Document Revised: 04/24/2016 Document Reviewed: 06/06/2015  2017 Elsevier   Pain medications  How can pain medicine affect me?  You were given a prescription for pain medicine. This medicine may make you tired or drowsy and may affect your ability to think clearly. Pain medicine may also affect your ability to drive or perform certain physical activities. It may not be possible to make all of your pain go away, but you should be comfortable enough to move, breathe, and take care of yourself. How often should I take pain medicine and how much should I take?  Take pain medicine only as directed by your health care provider and only as needed for pain.  You do not need to take pain medicine if you are not having pain, unless directed by your health care provider.  You can take less than the prescribed dose if you find that a smaller amount of medicine controls your pain. What restrictions do I have while taking pain medicine? Follow these instructions after you start taking pain medicine, while you are taking the medicine, and for 8 hours after you stop taking the medicine:  Do not drive.  Do not operate machinery.  Do not  operate power tools.  Do not sign legal documents.  Do not drink alcohol.  Do not take sleeping pills.  Do not supervise children by yourself.  Do not participate in activities that require climbing or being in high places.  Do not enter a body of water--such as a lake, river, ocean, spa, or swimming pool--without an adult nearby who can monitor and help you. How can I keep others safe while I am taking pain medicine?  Store your pain medicine as directed by your health care provider. Make sure that it is placed where children  and pets cannot reach it.  Never share your pain medicine with anyone.  Do not save any leftover pills. If you have any leftover pain medicine, get rid of it or destroy it as directed by your health care provider. What else do I need to know about taking pain medicine?  Use a stool softener if you become constipated from your pain medicine. Increasing your intake of fruits and vegetables will also help with constipation.  Write down the times when you take your pain medicine. Look at the times before you take your next dose of medicine. It is easy to become confused while on pain medicine. Recording the times helps you to avoid an overdose.  If your pain is severe, do not try to treat it yourself by taking more pills than instructed on your prescription. Contact your health care provider for help.  You may have been prescribed a pain medicine that contains acetaminophen. Do not take any other acetaminophen while taking this medicine. An overdose of acetaminophen can result in severe liver damage. Acetaminophen is found in many over-the-counter (OTC) and prescription medicines. If you are taking any medicines in addition to your pain medicine, check the active ingredients on those medicines to see if acetaminophen is listed. When should I call my health care provider?  Your medicine is not helping to make the pain go away.  You vomit or have diarrhea shortly  after taking the medicine.  You develop new pain in areas that did not hurt before.  You have an allergic reaction to your medicine. This may include: ? Itchiness. ? Swelling. ? Dizziness. ? Developing a new rash. When should I call 911 or go to the emergency room?  You feel dizzy or you faint.  You are very confused or disoriented.  You repeatedly vomit.  Your skin or lips turn pale or bluish in color.  You have shortness of breath or you are breathing much more slowly than usual.  You have a severe allergic reaction to your medicine. This includes: ? Developing tongue swelling. ? Having difficulty breathing. This information is not intended to replace advice given to you by your health care provider. Make sure you discuss any questions you have with your health care provider. Document Released: 02/23/2001 Document Revised: 06/06/2016 Document Reviewed: 09/21/2014

## 2016-10-22 NOTE — Progress Notes (Signed)
Physical Therapy Treatment Patient Details Name: Nicole GivensJanice D Dean MRN: [id number] DOB: 02/07/1941 Today's Date: 10/22/2016    History of Present Illness Patient is a 75 y/o female with hx of anxiety, CAD, HTN, HLD, AAA repair, depression presents s/p diagnostic laparoscopy with lysis of adhesions with conversion to open cholecystectomy 10/15/16    PT Comments    Pt admitted with above diagnosis. Pt currently with functional limitations due to balance and endurance deficits. Pt progressing well. Does best with RW and pt understands to use the RW at home. Pt will benefit from skilled PT to increase their independence and safety with mobility to allow discharge to the venue listed below.    Follow Up Recommendations  Home health PT;Supervision for mobility/OOB     Equipment Recommendations  None recommended by PT    Recommendations for Other Services       Precautions / Restrictions Precautions Precautions: Fall Precaution Comments: drain Restrictions Weight Bearing Restrictions: No    Mobility  Bed Mobility               General bed mobility comments: Up in chair upon PT arrival.   Transfers Overall transfer level: Needs assistance Equipment used: Rolling walker (2 wheeled) Transfers: Sit to/from Stand Sit to Stand: Min guard         General transfer comment: Min guard for safety, able to perfrom standing with proper hand placement  Ambulation/Gait Ambulation/Gait assistance: Min guard Ambulation Distance (Feet): 175 Feet Assistive device: Rolling walker (2 wheeled) Gait Pattern/deviations: Step-through pattern Gait velocity: decreased Gait velocity interpretation: Below normal speed for age/gender General Gait Details: Pt initially wanted to walk without RW but was holding onto furniture.  Instructed pt it is safer to use the equipment until she is steady and she agreed.  She is min assist without RW but min gurard to supervision with RW. slow gait with RW with  cues for proper hand placement on RW   Stairs            Wheelchair Mobility    Modified Rankin (Stroke Patients Only)       Balance Overall balance assessment: Needs assistance Sitting-balance support: No upper extremity supported;Feet supported Sitting balance-Leahy Scale: Fair     Standing balance support: Bilateral upper extremity supported;During functional activity Standing balance-Leahy Scale: Poor Standing balance comment: relies on RW for balance in standing.                     Cognition Arousal/Alertness: Awake/alert Behavior During Therapy: WFL for tasks assessed/performed Overall Cognitive Status: Within Functional Limits for tasks assessed                      Exercises Total Joint Exercises Ankle Circles/Pumps: AROM;Both;20 reps;Seated Long Arc Quad: AROM;Both;10 reps;Supine General Exercises - Lower Extremity Ankle Circles/Pumps: AROM;Both;5 reps;Seated Long Arc Quad: AROM;Both;5 reps;Seated Hip Flexion/Marching: AROM;Both;5 reps;Seated    General Comments        Pertinent Vitals/Pain Pain Assessment: 0-10 Pain Score: 5  Pain Location: right abdomen Pain Descriptors / Indicators: Aching;Grimacing;Sore Pain Intervention(s): Limited activity within patient's tolerance;Monitored during session;Repositioned;Premedicated before session  VSS    Home Living                      Prior Function            PT Goals (current goals can now be found in the care plan section) Acute Rehab PT Goals Patient Stated Goal:  to make this pain ease off Progress towards PT goals: Progressing toward goals    Frequency    Min 3X/week      PT Plan Current plan remains appropriate    Co-evaluation             End of Session Equipment Utilized During Treatment: Gait belt Activity Tolerance: Patient tolerated treatment well Patient left: in chair;with call bell/phone within reach     Time: 1322-1340 PT Time  Calculation (min) (ACUTE ONLY): 18 min  Charges:  $Gait Training: 8-22 mins                    G Codes:      Berline LopesDawn F Merit Maybee 10/22/2016, 2:40 PM Entergy CorporationDawn Alieah Brinton,PT Acute Rehabilitation [phone] (pager)

## 2016-10-22 NOTE — Progress Notes (Signed)
Clydie BraunKaren with [facility] notified of patient's discharge today, home with jp drain.

## 2016-10-22 NOTE — Progress Notes (Signed)
S: slept well, pain controlled. Tolerating diet without issue, having bowel function.   Vitals, labs, intake/output, and orders reviewed at this time.  Gen: A&Ox3, no distress  H&N: EOMI, atraumatic, neck supple Chest: unlabored respirations, RRR Abd: soft, nontender, nondistended, incision clean and intact without surrounding erythema, scant serosang drainage from medial aspect of RUQ incision. JP with green sludgy output Ext: warm Neuro: grossly normal  Lines/tubes/drains: RUQ Jp. PIV  A/P:  Acute gangrenous gallbladder, empyemaof gallbladder, severe adhesions S/p Diagnostic laparoscopy with lysis of adhesions, conversion to open cholecystectomy, 10/15/16, Dr. Claud KelpHaywood Ingram  POD 7. 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  Recovering well. From surgery standpoint she is safe for discharge. She needs drain teaching and instructions for dry dressing changes to the leaking part of her incision   Nicole Blakeshelsea Alanea Woolridge, MD [facility] [place] Pager [phone]

## 2016-10-22 NOTE — Discharge Summary (Addendum)
Physician Discharge Summary  Nicole Dean ZOX:[id number] DOB: August 13, 1941  PCP: [facility] [place]  Admit date: 10/13/2016 Discharge date: 10/22/2016  Recommendations for Outpatient Follow-up:  1. Dr. Claud Kelp, General Surgery on 10/27/16 at 8:15 AM. To be seen with repeat labs (CBC, BMP & magnesium). 2. PCP at [facility] [place] in 1 week.  Home Health: PT, RN and nursing aide. Equipment/Devices: Patient will go home with RUQ JP drain until outpatient follow-up with general surgery.  Discharge Condition: Improved and stable.  CODE STATUS: Full.  Diet recommendation: Soft diet.  Discharge Diagnoses:  Principal Problem:   Acute cholecystitis Active Problems:   HYPERTENSION, BENIGN ESSENTIAL   S/P CABG x 4   Status post AAA (abdominal aortic aneurysm) repair   Generalized abdominal pain   Acute gangrenous cholecystitis   Protein-calorie malnutrition, severe   Brief/Interim Summary: 75 year old female with a PMH of CAD status post CABG May 2017, AAA repair August 2017, anxiety, depression, GERD, HLD, HTN, presented to ED with worsening RUQ abdominal pain of 4 days' duration. CT abdomen and pelvis done in ED showed features concerning for cholecystitis with possible fistula. General surgery and cardiology were consulted. Cardiology cleared patient and patient underwent diagnostic laparoscopy with lysis of adhesions, conversion to open cholecystectomy 10/15/16. Postop transferred to stepdown unit. Improving.  Assessment & Plan Acute cholecystitis with cholelithiasis, s/p cholecystectomy 11/15 - s/p diagnostic laparoscopy with lysis of adhesions, conversion to open cholecystectomy for acute gangrenous gallbladder, empyema of gallbladder and severe adhesions. - Gen. surgery assisted with management. Diet was gradually advanced and she has tolerated soft diet and is having BM. Discussed with surgical team who have cleared her for  discharge today with JP drain and they have arranged for outpatient follow-up in 5 days with repeat labs. They have educated patient regarding dressing changes. Home health services have also been arranged to assist with same. - Gallbladder/abscess culture results, E. Coli resistant to Cipro and Proteus mirabilis. She was initially on Cipro and Flagyl but based on sensitivity results, ABX changed to Rocephin 10/17/2016-has completed 6 days of this. Surgical team recommends total 10 days of antibiotics. - Discussed with infectious disease M.D. on call who agrees with transitioning to oral Ceftin to complete total 10 days treatment. - Patient states that she has run out of tramadol. In the hospital, she has been receiving and tolerating OxyIR for pain management. She will be discharged on a short supply of OxyIR to control her postop pain.   Surgical pathology 10/15/16: Diagnosis Gallbladder CHOLELITHIASIS GALLBLADDER WITH SUPPURATIVE INFLAMMATION, ADHESION AND REACTIVE CHANGES NO EVIDENCE OF MALIGNANCY  Post op ileus - Resolved.  Post op fever  - noted 11/17 AM T up to 100.3 F, with HR up to 101 and RR > 22, WBC up from 14 --> 18 (11/16) --> 7.8 (WNL this AM) - please note this could have been related to the above, gallbladder cultures with E. Coli but now noted to be resistant to Cipro and ABX already changed to Rocephin  - today is day #6 of effective ABX treatment with Rocephin. Rest of antibiotic management as indicated above.  - Fever and leukocytosis resolved. Antibiotic management as above.  CAD status post CABG - No chest pain reported. Cardiology input appreciated she underwent surgery after cardiology clearance - Resumed reduced dose carvedilol 3.125 mg BID postop on 11/16, this was stopped subsequently due to low BP - Resume low dose carvedilol 3.125 MG twice a day at discharge. This can  be titrated up to her previous dose of 6.25 MG twice a day during outpatient follow-up with  PCP.  Brief episode of slurred speech 11/16 and hypotension - resolved quickly - pt now back to her baseline - MRI brain notable for remote hx of old, small left cerebellar infarct but no signs of acute infarction  - suspect all related to hypotension   Status post AAA repair August 2017 - outpatient follow up  Essential hypertension - with episodes of hypotension night 11/16 - Blood pressures have normalized. Resume low-dose carvedilol as indicated above.Discontinue HCTZ given issues with persistent hypokalemia and hypomagnesemia.  Hypokalemia/hypomagnesemia - Replaced prior to discharge. Follow BMP and magnesium during outpatient follow-up-arranged by surgical team.  Anemia of chronic disease  - slight drop in Hg since admission but no signs of active bleeding - Hg overall stable  - currently no indication for transfusion   Severe PCM - nutritionist consulted and was given nutritional supplements in the hospital. Diet as tolerated as outpatient.  Chronic insomnia  - patient is on low-dose lorazepam at bedtime. She states that she has run out of this and requested a new prescription. Provided short supply until outpatient follow-up with PCP.    Consultants:   General surgery  Cardiology   Procedures:   Diagnostic laparoscopy with lysis of adhesions, conversion to open cholecystectomy 11/15   Discharge Instructions  Discharge Instructions    Call MD for:  extreme fatigue    Complete by:  As directed    Call MD for:  hives    Complete by:  As directed    Call MD for:  persistant dizziness or light-headedness    Complete by:  As directed    Call MD for:  persistant nausea and vomiting    Complete by:  As directed    Call MD for:  redness, tenderness, or signs of infection (pain, swelling, redness, odor or green/yellow discharge around incision site)    Complete by:  As directed    Call MD for:  severe uncontrolled pain    Complete by:  As directed     Call MD for:  temperature >100.4    Complete by:  As directed    Discharge instructions    Complete by:  As directed    DIET: Soft diet, low salt and heart healthy.   Discharge wound care:    Complete by:  As directed    Abdominal drain management and dressing changes as instructed to you by the surgical team.   Increase activity slowly    Complete by:  As directed        Medication List    STOP taking these medications   ferrous fumarate-b12-vitamic C-folic acid capsule Commonly known as:  TRINSICON / FOLTRIN   hydrochlorothiazide 25 MG tablet Commonly known as:  HYDRODIURIL   omeprazole 40 MG capsule Commonly known as:  PRILOSEC   oxyCODONE-acetaminophen 5-325 MG tablet Commonly known as:  PERCOCET/ROXICET   traMADol 50 MG tablet Commonly known as:  ULTRAM     TAKE these medications   acetaminophen 325 MG tablet Commonly known as:  TYLENOL Take 2 tablets (650 mg total) by mouth every 4 (four) hours as needed for mild pain, moderate pain or fever.   aspirin 325 MG EC tablet Take 1 tablet (325 mg total) by mouth daily.   atorvastatin 40 MG tablet Commonly known as:  LIPITOR Take 1 tablet (40 mg total) by mouth daily. What changed:  when to take this  calcium carbonate 500 MG chewable tablet Commonly known as:  TUMS - dosed in mg elemental calcium Chew 5 tablets by mouth daily.   carvedilol 6.25 MG tablet Commonly known as:  COREG Take 0.5 tablets (3.125 mg total) by mouth 2 (two) times daily. What changed:  how much to take   cefUROXime 500 MG tablet Commonly known as:  CEFTIN Take 1 tablet (500 mg total) by mouth 2 (two) times daily with a meal. Start taking on:  10/23/2016   esomeprazole 40 MG capsule Commonly known as:  NEXIUM Take 40 mg by mouth daily at 12 noon.   LORazepam 0.5 MG tablet Commonly known as:  ATIVAN Take 1 tablet (0.5 mg total) by mouth at bedtime.   MULTIVITAMIN/IRON PO Take 1 tablet by mouth daily.   ondansetron 4 MG  disintegrating tablet Commonly known as:  ZOFRAN-ODT Take 1 tablet (4 mg total) by mouth every 8 (eight) hours as needed for nausea or vomiting. What changed:  medication strength  how much to take   oxyCODONE 5 MG immediate release tablet Commonly known as:  Oxy IR/ROXICODONE Take 1-2 tablets (5-10 mg total) by mouth every 6 (six) hours as needed for severe pain or breakthrough pain.   potassium chloride 10 MEQ tablet Commonly known as:  K-DUR Take 1 tablet (10 mEq total) by mouth daily.   senna 8.6 MG Tabs tablet Commonly known as:  SENOKOT Take 1 tablet (8.6 mg total) by mouth 2 (two) times daily.   ZOLOFT 100 MG tablet Generic drug:  sertraline Take 100 mg by mouth daily with breakfast.      Follow-up Information    Ernestene MentionINGRAM,HAYWOOD M, MD Follow up on 10/27/2016.   Specialty:  General Surgery Why:  Appt is 8:15 AM. Please go to [facility] on 2nd floor of our building to have labs drawn prior to your appointment (CBC, BMP, Magnesium); lab opens at 730AM , be at the office 30 minutes early for check in.  Bring photo ID and insurance information.  Contact information: [address] [place] [address] [phone]        [facility]. Schedule an appointment as soon as possible for a visit in 1 week(s).   Specialty:  Family Medicine Contact information: [address] [place] [phone]          Allergies  Allergen Reactions  . Codeine Nausea And Vomiting    Severe, ended up in ED  . Penicillins Nausea Only and Other (See Comments)    Has patient had a PCN reaction causing immediate rash, facial/tongue/throat swelling, SOB or lightheadedness with hypotension: Yes Has patient had a PCN reaction causing severe rash involving mucus membranes or skin necrosis: No Has patient had a PCN reaction that required hospitalization No Has patient had a PCN reaction occurring within the last 10 years:  No If all of the above answers are "NO", then may proceed with Cephalosporin use.       Procedures/Studies: Ct Head Wo Contrast  Result Date: 10/16/2016 CLINICAL DATA:  A aphasia.  Left facial droop, improving. EXAM: CT HEAD WITHOUT CONTRAST TECHNIQUE: Contiguous axial images were obtained from the base of the skull through the vertex without intravenous contrast. COMPARISON:  None. FINDINGS: Brain: 2 oval areas of decreased density in the pons, 1 centrally on the right and 1 anteriorly on the left. No intracranial hemorrhage or mass effect. 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 Vascular: No hyperdense  vessel or unexpected calcification. Skull: Normal. Negative for fracture or focal lesion. Sinuses/Orbits: No acute finding. Other: None. IMPRESSION: 1. Possible subacute infarcts or areas of chronic small vessel ischemic change in the pons on both sides. 2. Mild diffuse cerebral atrophy. Electronically Signed   By: Beckie SaltsSteven  Reid M.D.   On: 10/16/2016 18:05   Mr Maxine GlennMra Head Wo Contrast  Result Date: 10/16/2016 CLINICAL DATA:  Slurred speech and facial droop. Altered mental status. Follow-up abnormal CT HEAD. EXAM: MRI HEAD WITHOUT CONTRAST MRA HEAD WITHOUT CONTRAST TECHNIQUE: Multiplanar, multiecho pulse sequences of the brain and surrounding structures were obtained without intravenous contrast. Angiographic images of the head were obtained using MRA technique without contrast. COMPARISON:  CT HEAD October 16, 2016 at 1747 hours FINDINGS: MRI HEAD FINDINGS BRAIN: No reduced diffusion to suggest acute ischemia. No susceptibility artifact to suggest hemorrhage. A few nonspecific punctate foci of susceptibility artifact. The ventricles and sulci are normal for patient's age. LEFT cerebellar infarcts. Scattered sub cm supratentorial and pontine white matter FLAIR T2 hyperintensities are less than expected for age most compatible with chronic small vessel ischemic disease. No suspicious  parenchymal signal, masses or mass effect. No abnormal extra-axial fluid collections. No extra-axial masses though, contrast enhanced sequences would be more sensitive. VASCULAR: Normal major intracranial vascular flow voids present at skull base. SKULL AND UPPER CERVICAL SPINE: No abnormal sellar expansion. No suspicious calvarial bone marrow signal. Craniocervical junction maintained. SINUSES/ORBITS: The mastoid air-cells and included paranasal sinuses are well-aerated. Status post bilateral ocular lens implants. The included ocular globes and orbital contents are non-suspicious. OTHER: Patient is edentulous. MRA HEAD FINDINGS ANTERIOR CIRCULATION: Normal flow related enhancement of the included cervical, petrous, cavernous and supraclinoid internal carotid arteries. Patent anterior communicating artery. Normal flow related enhancement of the anterior and middle cerebral arteries, including distal segments. No large vessel occlusion, high-grade stenosis, abnormal luminal irregularity, aneurysm. POSTERIOR CIRCULATION: RIGHT vertebral artery is dominant. Basilar artery is patent, with normal flow related enhancement of the main branch vessels. Normal flow related enhancement of the posterior cerebral arteries. Small RIGHT, robust LEFT posterior communicating arteries present. No large vessel occlusion, high-grade stenosis, abnormal luminal irregularity, aneurysm. IMPRESSION: MRI HEAD: No acute intracranial process, specifically no acute ischemia. 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 Minimal chronic small vessel ischemic disease. MRA HEAD: Negative; no emergent large vessel or severe stenosis. Electronically Signed   By: Awilda Metroourtnay  Bloomer M.D.   On: 10/16/2016 22:28   Mr Brain Wo Contrast  Result Date: 10/16/2016 CLINICAL DATA:  Slurred speech and facial droop. Altered mental status. Follow-up abnormal CT HEAD. EXAM: MRI HEAD WITHOUT CONTRAST MRA HEAD WITHOUT CONTRAST TECHNIQUE: Multiplanar, multiecho pulse  sequences of the brain and surrounding structures were obtained without intravenous contrast. Angiographic images of the head were obtained using MRA technique without contrast. COMPARISON:  CT HEAD October 16, 2016 at 1747 hours FINDINGS: MRI HEAD FINDINGS BRAIN: No reduced diffusion to suggest acute ischemia. No susceptibility artifact to suggest hemorrhage. A few nonspecific punctate foci of susceptibility artifact. The ventricles and sulci are normal for patient's age. LEFT cerebellar infarcts. Scattered sub cm supratentorial and pontine white matter FLAIR T2 hyperintensities are less than expected for age most compatible with chronic small vessel ischemic disease. No suspicious parenchymal signal, masses or mass effect. No abnormal extra-axial fluid collections. No extra-axial masses though, contrast enhanced sequences would be more sensitive. VASCULAR: Normal major intracranial vascular flow voids present at skull base. SKULL AND UPPER CERVICAL SPINE: No abnormal sellar expansion. No suspicious calvarial  bone marrow signal. Craniocervical junction maintained. SINUSES/ORBITS: The mastoid air-cells and included paranasal sinuses are well-aerated. Status post bilateral ocular lens implants. The included ocular globes and orbital contents are non-suspicious. OTHER: Patient is edentulous. MRA HEAD FINDINGS ANTERIOR CIRCULATION: Normal flow related enhancement of the included cervical, petrous, cavernous and supraclinoid internal carotid arteries. Patent anterior communicating artery. Normal flow related enhancement of the anterior and middle cerebral arteries, including distal segments. No large vessel occlusion, high-grade stenosis, abnormal luminal irregularity, aneurysm. POSTERIOR CIRCULATION: RIGHT vertebral artery is dominant. Basilar artery is patent, with normal flow related enhancement of the main branch vessels. Normal flow related enhancement of the posterior cerebral arteries. Small RIGHT, robust LEFT  posterior communicating arteries present. No large vessel occlusion, high-grade stenosis, abnormal luminal irregularity, aneurysm. IMPRESSION: MRI HEAD: No acute intracranial process, specifically no acute ischemia. 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 Minimal chronic small vessel ischemic disease. MRA HEAD: Negative; no emergent large vessel or severe stenosis. Electronically Signed   By: Awilda Metro M.D.   On: 10/16/2016 22:28   Ct Abdomen Pelvis W Contrast  Result Date: 10/13/2016 CLINICAL DATA:  Acute onset of generalized abdominal pain, nausea vomiting. Chronic weight loss. Severe focal tenderness at the right hip. Initial encounter. EXAM: CT ABDOMEN AND PELVIS WITH CONTRAST TECHNIQUE: Multidetector CT imaging of the abdomen and pelvis was performed using the standard protocol following bolus administration of intravenous contrast. CONTRAST:  80mL ISOVUE-300 IOPAMIDOL (ISOVUE-300) INJECTION 61% COMPARISON:  CT of the abdomen and pelvis from 02/19/2016 FINDINGS: Lower chest: The visualized lung bases are grossly clear. Scattered coronary artery calcifications are seen. 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 Hepatobiliary: The gallbladder is diffusely inflamed and edematous, with scattered air noted in the gallbladder. An underlying stone is again seen. The appearance is suspicious for emphysematous cholecystitis. Alternatively, the first segment of the duodenum directly abuts the gallbladder, with associated soft tissue inflammation and question of communication. A cholecystoduodenal fistula cannot be excluded. There is underlying edema and congestion at the gallbladder fossa. The liver is otherwise unremarkable. A single tiny focus of air at the left hepatic lobe likely reflects pneumobilia. The common bile duct is grossly unremarkable in appearance. Pancreas: The pancreas is grossly unremarkable  in appearance. 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 Spleen: The spleen is unremarkable in appearance. Adrenals/Urinary Tract: The adrenal glands are unremarkable in appearance. Scattered right renal cysts are noted. The kidneys are otherwise unremarkable. There is no evidence of hydronephrosis. No renal or ureteral stones are seen. No perinephric stranding is appreciated. Stomach/Bowel: The stomach is grossly unremarkable in appearance. The visualized small bowel is otherwise grossly unremarkable. The appendix remains normal in caliber. There is no evidence of appendicitis. Mild diverticulosis is noted along the sigmoid colon, without evidence of diverticulitis. Vascular/Lymphatic: The patient is status post aortobifemoral bypass graft and abdominal aortic aneurysm repair. Underlying diffuse calcification is noted. There is no evidence of recurrent aneurysm. Minimal nonspecific soft tissue inflammation is seen tracking anterior to the right common iliac artery graft. No definite retroperitoneal or pelvic sidewall lymphadenopathy is seen. Reproductive: The bladder is mildly distended and within normal limits. The uterus is grossly unremarkable in appearance. The ovaries are relatively symmetric. No suspicious adnexal masses are seen. Other: Postoperative change is noted along the anterior abdominal wall, and at both lower  quadrants. Vague diffuse soft tissue inflammation is noted overlying the right greater femoral trochanter, with trace associated fluid extending from the greater femoral trochanter into the soft tissues, measuring approximately 3.3 x 1.8 x 0.7 cm. This could reflect an infected bursitis. Evolving abscess cannot be entirely excluded. Musculoskeletal: No acute osseous abnormalities are identified. Multilevel vacuum phenomenon is noted along the lower thoracic and lumbar spine. There is grade 2  anterolisthesis of L4 on L5, reflecting underlying facet disease. The visualized musculature is unremarkable in appearance. IMPRESSION: 1. Gallbladder diffusely inflamed and edematous, with scattered air in the gallbladder. Underlying stone seen. The appearance is suspicious for emphysematous cholecystitis. Alternatively, the first segment of the duodenum directly abuts the gallbladder, with associated soft tissue inflammation and question of communication. A cholecystoduodenal fistula cannot be excluded. 2. Underlying edema and congestion at the gallbladder fossa. Tiny focus of suspected pneumobilia noted at the left hepatic lobe. 3. Vague diffuse soft tissue inflammation overlying the right greater femoral trochanter, with trace associated fluid extending from the greater femoral trochanter into the soft tissues, measuring approximately 3.3 x 1.8 x 0.7 cm. This could reflect an infected bursitis, with surrounding phlegmon. Evolving abscess cannot be entirely excluded. 4. 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 5. Scattered coronary artery calcifications seen. 6. 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 7. Aortobifemoral bypass graft appears grossly patent. Underlying diffuse aortic atherosclerosis. Nonspecific inflammation along the anterior aspect of the right common iliac artery graft. 8. Mild degenerative change at the lower thoracic and lumbar spine. These results were called by telephone at the time of interpretation on 10/13/2016 at 11:40 pm to Dr. Vanetta Mulders, who verbally acknowledged these results. Electronically Signed   By: Roanna Raider M.D.   On: 10/13/2016 23:43      Subjective: She states that she feels much better compared to initial admission. Abdominal pain has progressively decreased but still requires when  necessary OxyIR. Abdominal pain, right upper quadrant rated at 3-4/10, intermittent and nonradiating. Tolerating soft diet. Having BMs. Denies any other complaints. As per RN, ambulating steadily without acute issues.  Discharge Exam:  Vitals:   10/21/16 1401 10/21/16 2117 10/22/16 0502 10/22/16 1412  BP: (!) 119/56 (!) 121/53 118/75 (!) 119/55  Pulse: 89 87 (!) 57 80  Resp: 18 18 18 18   Temp: 98 F (36.7 C) 98.3 F (36.8 C) 98 F (36.7 C) 98 F (36.7 C)  TempSrc: Oral Oral  Oral  SpO2: 93% 96% 98% 96%  Weight:      Height:        General exam: Pleasant elderly female sitting up comfortably in chair this morning. Respiratory system: Clear to auscultation. Respiratory effort normal. Cardiovascular system: S1 & S2 heard, RRR. No JVD, murmurs, rubs, gallops or clicks. No pedal edema. Gastrointestinal system: Abdomen: Surgical site dressing is clean and dry. Appropriate tenderness in RUQ without peritoneal signs. JP drain with minimal bilious drainage. No organomegaly or masses felt. Normal bowel sounds heard. Central nervous system: Alert and oriented. No focal neurological deficits.    The results of significant diagnostics from this hospitalization (including imaging, microbiology, ancillary and laboratory) are listed below for reference.     Microbiology: Recent Results (from the past 240 hour(s))  Surgical PCR screen     Status: None   Collection Time: 10/15/16  4:35 AM  Result  Value Ref Range Status   MRSA, PCR NEGATIVE NEGATIVE Final   Staphylococcus aureus NEGATIVE NEGATIVE Final    Comment:        The Xpert SA Assay (FDA approved for NASAL specimens in patients over 34 years of age), is one component of a comprehensive surveillance program.  Test performance has been validated by [facility] for patients greater than or equal to 18 year old. It is not intended to diagnose infection nor to guide or monitor treatment.   Aerobic/Anaerobic Culture (surgical/deep  wound)     Status: None (Preliminary result)   Collection Time: 10/15/16  1:50 PM  Result Value Ref Range Status   Specimen Description ABSCESS  Final   Special Requests GALL BLADDER  Final   Gram Stain   Final    MODERATE WBC PRESENT,BOTH PMN AND MONONUCLEAR FEW GRAM NEGATIVE RODS FEW GRAM POSITIVE COCCI IN CHAINS    Culture   Final    MODERATE ESCHERICHIA COLI MODERATE PROTEUS MIRABILIS HOLDING FOR POSSIBLE ANAEROBE    Report Status PENDING  Incomplete   Organism ID, Bacteria ESCHERICHIA COLI  Final   Organism ID, Bacteria PROTEUS MIRABILIS  Final      Susceptibility   Escherichia coli - MIC*    AMPICILLIN >=32 RESISTANT Resistant     CEFAZOLIN <=4 SENSITIVE Sensitive     CEFEPIME <=1 SENSITIVE Sensitive     CEFTAZIDIME <=1 SENSITIVE Sensitive     CEFTRIAXONE <=1 SENSITIVE Sensitive     CIPROFLOXACIN >=4 RESISTANT Resistant     GENTAMICIN <=1 SENSITIVE Sensitive     IMIPENEM <=0.25 SENSITIVE Sensitive     TRIMETH/SULFA <=20 SENSITIVE Sensitive     AMPICILLIN/SULBACTAM 16 INTERMEDIATE Intermediate     PIP/TAZO <=4 SENSITIVE Sensitive     Extended ESBL NEGATIVE Sensitive     * MODERATE ESCHERICHIA COLI   Proteus mirabilis - MIC*    AMPICILLIN <=2 SENSITIVE Sensitive     CEFAZOLIN <=4 SENSITIVE Sensitive     CEFEPIME <=1 SENSITIVE Sensitive     CEFTAZIDIME <=1 SENSITIVE Sensitive     CEFTRIAXONE <=1 SENSITIVE Sensitive     CIPROFLOXACIN <=0.25 SENSITIVE Sensitive     GENTAMICIN <=1 SENSITIVE Sensitive     IMIPENEM 8 INTERMEDIATE Intermediate     TRIMETH/SULFA <=20 SENSITIVE Sensitive     AMPICILLIN/SULBACTAM <=2 SENSITIVE Sensitive     PIP/TAZO <=4 SENSITIVE Sensitive     * MODERATE PROTEUS MIRABILIS     Labs: BNP (last 3 results) No results for input(s): BNP in the last 8760 hours. Basic Metabolic Panel:  Recent Labs Lab 10/18/16 0359 10/19/16 0525 10/20/16 0546 10/21/16 0300 10/21/16 1600 10/22/16 0454  NA 138 137 137 138  --  139  K 3.3* 3.6 3.4* 3.0*   --  3.3*  CL 111 110 108 110  --  108  CO2 20* 21* 22 22  --  24  GLUCOSE 101* 95 87 102*  --  95  BUN <5* <5* <5* <5*  --  <5*  CREATININE 0.36* 0.46 0.47 0.36*  --  0.36*  CALCIUM 7.9* 8.2* 7.8* 7.5*  --  7.5*  MG  --   --   --   --  1.3*  --    Liver Function Tests:  Recent Labs Lab 10/16/16 0426 10/17/16 0222 10/20/16 0546  AST 42* 26 13*  ALT 16 12* 9*  ALKPHOS 69 56 56  BILITOT 0.6 0.3 0.5  PROT 5.1* 3.8* 4.1*  ALBUMIN 2.4* 1.8* 1.8*  No results for input(s): LIPASE, AMYLASE in the last 168 hours. No results for input(s): AMMONIA in the last 168 hours. CBC:  Recent Labs Lab 10/18/16 0359 10/19/16 0525 10/20/16 0546 10/21/16 0300 10/22/16 0454  WBC 10.0 10.8* 9.0 7.8 7.0  HGB 9.2* 9.3* 9.4* 9.5* 9.3*  HCT 28.8* 29.6* 29.4* 29.4* 29.0*  MCV 87.0 86.3 87.0 86.7 86.6  PLT 255 292 303 312 335   Urinalysis    Component Value Date/Time   COLORURINE AMBER (A) 10/13/2016 1619   APPEARANCEUR CLOUDY (A) 10/13/2016 1619   LABSPEC 1.025 10/13/2016 1619   PHURINE 5.0 10/13/2016 1619   GLUCOSEU NEGATIVE 10/13/2016 1619   HGBUR NEGATIVE 10/13/2016 1619   BILIRUBINUR SMALL (A) 10/13/2016 1619   KETONESUR NEGATIVE 10/13/2016 1619   PROTEINUR NEGATIVE 10/13/2016 1619   UROBILINOGEN 0.2 01/17/2014 0933   NITRITE NEGATIVE 10/13/2016 1619   LEUKOCYTESUR SMALL (A) 10/13/2016 1619      Time coordinating discharge: Over 30 minutes  SIGNED:  Marcellus Scott, MD, FACP, FHM. [facility] Pager [phone]  If 7PM-7AM, please contact night-coverage [email or web address] Password [facility] 10/22/2016, 4:52 PM

## 2016-10-22 NOTE — Progress Notes (Signed)
While going over discharge paperwork with daughter in room, patient became nauseous and threw up. Daughter was concerned about her going home sick and would rather her stay the night and go home tomorrow morning. MD Hangolgi entered room and I notified him of patient concerns and he said that was fine to just keep until tomorrow morning and to d/c the discharge order.

## 2016-10-23 LAB — BASIC METABOLIC PANEL
Anion gap: 7 (ref 5–15)
BUN: <5 mg/dL — ABNORMAL LOW (ref 6–20)
CALCIUM: 7.7 mg/dL — AB (ref 8.9–10.3)
CO2: 24 mmol/L (ref 22–32)
CREATININE: 0.4 mg/dL — AB (ref 0.44–1.00)
Chloride: 107 mmol/L (ref 101–111)
GFR calc non Af Amer: >60 mL/min (ref >60)
Glucose, Bld: 94 mg/dL (ref 65–99)
Potassium: 3.6 mmol/L (ref 3.5–5.1)
Sodium: 138 mmol/L (ref 135–145)

## 2016-10-23 LAB — AEROBIC/ANAEROBIC CULTURE W GRAM STAIN (SURGICAL/DEEP WOUND)

## 2016-10-23 LAB — AEROBIC/ANAEROBIC CULTURE (SURGICAL/DEEP WOUND)

## 2016-10-23 LAB — CBC
HEMATOCRIT: 30 % — AB (ref 36.0–46.0)
Hemoglobin: 9.6 g/dL — ABNORMAL LOW (ref 12.0–15.0)
MCH: 27.7 pg (ref 26.0–34.0)
MCHC: 32 g/dL (ref 30.0–36.0)
MCV: 86.7 fL (ref 78.0–100.0)
Platelets: 388 10*3/uL (ref 150–400)
RBC: 3.46 MIL/uL — ABNORMAL LOW (ref 3.87–5.11)
RDW: 17.7 % — AB (ref 11.5–15.5)
WBC: 7.5 10*3/uL (ref 4.0–10.5)

## 2016-10-23 LAB — MAGNESIUM: Magnesium: 1.8 mg/dL (ref 1.7–2.4)

## 2016-10-23 NOTE — Progress Notes (Signed)
Pt discharged to home.  Discharge instructions explained to pt.  Pt has JP drain and shown how to empty drain and change the dressing.  Pt has no further questions. Pt states she has all belongings. IV removed.  Pt taken off unit via wheelchair by staff.

## 2016-10-23 NOTE — Progress Notes (Addendum)
Progress note  Patient had been discharged on 10/22/16 but had an episode of nausea and nonbloody emesis followed by short period of persistent nausea and she and family were reluctant to go home and hence was monitored overnight. Today she states she feels much better. No further episodes of vomiting and denies nausea. Intermittent mild RUQ pain is controlled. Had normal BM this morning. Tolerating diet. Anxious to go home.  Sitting up comfortably in chair. Son at bedside. Does not appear in any distress. Vital signs and physical exam stable without new findings. Lab work reviewed. Hypokalemia and hypomagnesemia have resolved. Anemia is stable and asymptomatic.   Assessment and plan: 1. Acute gangrenous cholecystitis, empyema of the gallbladder, severe adhesions, status post diagnostic laparoscopy with lysis of adhesions, conversion to open cholecystectomy 10/15/16: Continues to improve. Last evenings vomiting seems to be a 1 off episode in her post op recovery. Discussed with surgical team who have evaluated her and have cleared her for discharge. Outpatient follow-up has been arranged. She will go home with a drain which will be evaluated during outpatient follow-up by surgery. Rest as per DC summary from 10/22/16. 2. Hypokalemia and hypomagnesemia: Replaced 3. Essential hypertension: Controlled. Has tolerated low-dose carvedilol.  She will be discharged home today.  Discussed in detail with patient's son at bedside. Updated care and answered questions.  Marcellus ScottHONGALGI,Gagandeep Pettet, MD, FACP, FHM. [facility] Pager [phone]  If 7PM-7AM, please contact night-coverage [email or web address] Password [email or web address] 10/23/2016, 12:18 PM

## 2016-10-23 NOTE — Progress Notes (Signed)
Patient ID: Nicole Dean, female   DOB: Jun 30, 1941, 75 y.o.   MRN: [id number]  [facility] Progress [facility]  8 Days Post-Op  Subjective: Feeling a little better this morning. Hoping to go home. Appetite slowly improving.  Objective: Vital signs in last 24 hours: Temp:  [98 F (36.7 C)-98.4 F (36.9 C)] 98.2 F (36.8 C) (11/23 0623) Pulse Rate:  [80-82] 82 (11/23 0623) Resp:  [18-19] 19 (11/23 0623) BP: (115-120)/(55-61) 120/60 (11/23 0623) SpO2:  [96 %-98 %] 98 % (11/23 0623) Last BM Date: 10/21/16  Intake/Output from previous day: 11/22 0701 - 11/23 0700 In: 120 [P.O.:120] Out: 125 [Urine:100; Drains:25] Intake/Output this shift: No intake/output data recorded.  PE: General appearance: alert, cooperative and no distress Resp: clear to auscultation bilaterally, effort normal GI: soft, nondistended, incisions healing well with staples intact, drain site with trace serosanguinous drainage around tubing, bilous fluid in drain, appropriately tender Extremities: mild lower extremity edema  Lab Results:   Recent Labs  10/22/16 0454 10/23/16 0400  WBC 7.0 7.5  HGB 9.3* 9.6*  HCT 29.0* 30.0*  PLT 335 388   BMET  Recent Labs  10/22/16 0454 10/23/16 0400  NA 139 138  K 3.3* 3.6  CL 108 107  CO2 24 24  GLUCOSE 95 94  BUN <5* <5*  CREATININE 0.36* 0.40*  CALCIUM 7.5* 7.7*   PT/INR No results for input(s): LABPROT, INR in the last 72 hours. CMP     Component Value Date/Time   NA 138 10/23/2016 0400   K 3.6 10/23/2016 0400   CL 107 10/23/2016 0400   CO2 24 10/23/2016 0400   GLUCOSE 94 10/23/2016 0400   BUN <5 (L) 10/23/2016 0400   CREATININE 0.40 (L) 10/23/2016 0400   CREATININE 0.62 06/04/2016 0958   CALCIUM 7.7 (L) 10/23/2016 0400   PROT 4.1 (L) 10/20/2016 0546   ALBUMIN 1.8 (L) 10/20/2016 0546   AST 13 (L) 10/20/2016 0546   ALT 9 (L) 10/20/2016 0546   ALKPHOS 56 10/20/2016 0546   BILITOT 0.5 10/20/2016 0546   GFRNONAA >60 10/23/2016 0400    GFRAA >60 10/23/2016 0400   Lipase     Component Value Date/Time   LIPASE 41 10/13/2016 1627       Studies/Results: No results found.  Anti-infectives: Anti-infectives    Start     Dose/Rate Route Frequency Ordered Stop   10/23/16 0000  cefUROXime (CEFTIN) 500 MG tablet     500 mg Oral 2 times daily with meals 10/22/16 1426     10/22/16 1200  metroNIDAZOLE (FLAGYL) tablet 500 mg     500 mg Oral Every 8 hours 10/22/16 1048     10/17/16 1245  cefTRIAXone (ROCEPHIN) 1 g in dextrose 5 % 50 mL IVPB     1 g 100 mL/hr over 30 Minutes Intravenous Every 24 hours 10/17/16 1236     10/15/16 1900  ciprofloxacin (CIPRO) IVPB 400 mg  Status:  Discontinued     400 mg 200 mL/hr over 60 Minutes Intravenous Every 12 hours 10/15/16 1825 10/17/16 1236   10/15/16 1830  metroNIDAZOLE (FLAGYL) IVPB 500 mg  Status:  Discontinued     500 mg 100 mL/hr over 60 Minutes Intravenous Every 8 hours 10/15/16 1825 10/22/16 1048   10/14/16 1000  ciprofloxacin (CIPRO) IVPB 400 mg  Status:  Discontinued    Comments:  Cipro 400 mg IV q12h for CrCl > 30 mL/min   400 mg 200 mL/hr over 60 Minutes Intravenous Every 12  hours 10/14/16 0307 10/15/16 2104   10/14/16 0315  metroNIDAZOLE (FLAGYL) IVPB 500 mg  Status:  Discontinued     500 mg 100 mL/hr over 60 Minutes Intravenous Every 8 hours 10/14/16 0302 10/15/16 2103   10/14/16 0030  ciprofloxacin (CIPRO) IVPB 400 mg     400 mg 200 mL/hr over 60 Minutes Intravenous  Once 10/14/16 0020 10/14/16 1035       Assessment/Plan Acute gangrenous gallbladder, empyemaof gallbladder, severe adhesions S/p Diagnostic laparoscopy with lysis of adhesions, conversion to open cholecystectomy, 10/15/16, Dr. Claud KelpHaywood Ingram  POD 8  CAD status post CABG 04/04/16 Status post AAA repair 07/22/16 HTN Anemia - Hg 9.6, stable Hypokalemia -  resolved Hypomag - resolved  ID - IV Cipro 11/12 - 11/16 completed >>, IV metronidazole 11/12 Day 10, Ceftriaxone 10/17/16 >>day 7 FEN -  soft diet VTE - Lovenox  Plan:  tolerating diet, pain controlled, electrolyte abnormalities resolved. Ready for discharge from a surgical standpoint. Patient will go home with drain. Abdominal binder if helpful with pain. She will follow-up with Dr. Derrell LollingIngram next week with labs prior to that appointment.   LOS: 9 days    Edson SnowballBROOKE A MILLER , [facility] 10/23/2016, 8:34 AM Pager: [phone] Consults: [phone] Mon-Fri 7:00 am-4:30 pm Sat-Sun 7:00 am-11:30 am

## 2016-10-27 ENCOUNTER — Other Ambulatory Visit: Payer: Self-pay | Admitting: Cardiovascular Disease

## 2016-11-07 ENCOUNTER — Encounter: Payer: Self-pay | Admitting: Cardiovascular Disease

## 2016-11-07 ENCOUNTER — Ambulatory Visit (INDEPENDENT_AMBULATORY_CARE_PROVIDER_SITE_OTHER): Payer: Medicare HMO | Admitting: Cardiovascular Disease

## 2016-11-07 VITALS — BP 144/70 | HR 94 | Ht 62.0 in | Wt 117.0 lb

## 2016-11-07 DIAGNOSIS — I10 Essential (primary) hypertension: Secondary | ICD-10-CM | POA: Diagnosis not present

## 2016-11-07 DIAGNOSIS — Z79899 Other long term (current) drug therapy: Secondary | ICD-10-CM

## 2016-11-07 DIAGNOSIS — I25119 Atherosclerotic heart disease of native coronary artery with unspecified angina pectoris: Secondary | ICD-10-CM

## 2016-11-07 LAB — BASIC METABOLIC PANEL
BUN: 13 mg/dL (ref 7–25)
CHLORIDE: 99 mmol/L (ref 98–110)
CO2: 23 mmol/L (ref 20–31)
Calcium: 9 mg/dL (ref 8.6–10.4)
Creat: 0.51 mg/dL — ABNORMAL LOW (ref 0.60–0.93)
Glucose, Bld: 91 mg/dL (ref 65–99)
POTASSIUM: 4.4 mmol/L (ref 3.5–5.3)
SODIUM: 138 mmol/L (ref 135–146)

## 2016-11-07 NOTE — Progress Notes (Signed)
Cardiology Office Note   Date:  11/07/2016   ID:  Nicole Dean, DOB 11/14/1941, MRN [id number]  PCP:  [facility]  Cardiologist:   Kristeen MissPhilip Debany Vantol, MD   Chief Complaint  Patient presents with  . Coronary Artery Disease  . Hypertension    Problem List 1. CAD - s/p CABG  04/04/16   Left internal mammary graft to the LAD  Right internal mammary graft to the PDA  SVG to diagonal        SVG to OM 2.  AAA - s/p repair with Ao- bi fem graft. 07/22/16 Nicole Bo- Dickson 2. Essential HTN 3. Hyperlipidemia     Nicole GivensJanice D Dean is a 75 y.o. female who presents for pre-op eval prior to AAA repair Was seen with daughter , Nicole CosierCrystal .  She has known about this AAA for a while,   Was offered repair but she declined - too much going on Recently found that the AAA had enlarged Does not have any abdominal pain  - has bilateral groin pain with walking   Has leg pain and weakness with climbing stairs.  No real CP or dyspnea   Former smoker - quit 17 year ago .   June 04, 2016: She has had CABG by Dr. Laneta SimmersBartle on May 5. She appears to be doing quite well. No CP , breathing is good.  She saw [name]. PA  on May 30. Her HCTZ was stopped at that time but Nicole NixonJanice developed significant leg edema. The HCTZ has been restarted and she seems to be doing quite well.  Still has not had herr AAA repaired yet  Dec. 8, 2017:  Nicole NixonJanice is seen with son, Dean NovemberMike .  CABG in May Had AAA repair in Aug Gall bladder surgery Nov. 15, 2017 [name]  Had  a wound vac .   Still draining.      Past Medical History:  Diagnosis Date  . AAA (abdominal aortic aneurysm) (HCC)    VVS- following   . Anemia   . Anxiety   . Arthritis    hands  . Cholecystitis 10/2016  . Coronary artery disease   . Depression   . GERD (gastroesophageal reflux disease)   . History of bronchitis   . History of pneumonia   . Hyperlipidemia   . Hypertension   . Numbness and tingling of right leg   .  Shortness of breath dyspnea    sometimes even with resting; pt. denies since heart surgery  . Wears glasses     Past Surgical History:  Procedure Laterality Date  . ABDOMINAL AORTIC ANEURYSM REPAIR N/A 07/22/2016   Procedure: JUXTARENAL  ABDOMINAL AORTIC ANEURYSM REPAIR;  Surgeon: Chuck Hinthristopher S Dickson, MD;  [facility];  Service: Vascular;  Laterality: N/A;  . AORTA - BILATERAL FEMORAL ARTERY BYPASS GRAFT N/A 07/22/2016   Procedure: AORTA BIFEMORAL BYPASS GRAFT;  Surgeon: Chuck Hinthristopher S Dickson, MD;  [facility];  Service: Vascular;  Laterality: N/A;  . CARDIAC CATHETERIZATION N/A 03/31/2016   Procedure: Left Heart Cath and Coronary Angiography;  Surgeon: Peter M SwazilandJordan, MD;  Location: [facility] INVASIVE CV LAB;  Service: Cardiovascular;  Laterality: N/A;  . CHOLECYSTECTOMY N/A 10/15/2016   Procedure: LAPAROSCOPIC CONVERTED TO OPEN CHOLECYSTECTOMY;  Surgeon: Claud KelpHaywood Ingram, MD;  Location: [facility] OR;  Service: General;  Laterality: N/A;  . CHOLECYSTECTOMY, LAPAROSCOPIC  10/15/2016  . COLONOSCOPY    . CORONARY ARTERY BYPASS GRAFT N/A 04/04/2016   Procedure: CORONARY ARTERY  BYPASS GRAFTING (CABG) x 4 using bilateral internal mammary arteries and right saphenous vein harvested by endovein;  Surgeon: Alleen Borne, MD;  Location: [facility] OR;  Service: Open Heart Surgery;  Laterality: N/A;  . EYE SURGERY Bilateral    cataracts removed./ IOL  . PERIPHERAL VASCULAR CATHETERIZATION N/A 03/03/2016   Procedure: Abdominal Aortogram w/Lower Extremity;  Surgeon: Chuck Hint, MD;  [facility] INVASIVE CV LAB;  Service: Cardiovascular;  Laterality: N/A;  . REPAIR ILIAC ARTERY Right 07/22/2016   Procedure: RIGHT COMMON ILIAC ANEURYSM REPAIR;  Surgeon: Chuck Hint, MD;  Location[facility] OR;  Service: Vascular;  Laterality: Right;  . SHOULDER ARTHROSCOPY W/ ROTATOR CUFF REPAIR Right   . TEE WITHOUT CARDIOVERSION N/A 04/04/2016   Procedure: TRANSESOPHAGEAL ECHOCARDIOGRAM (TEE);  Surgeon: Alleen Borne, MD;   Location[facility] OR;  Service: Open Heart Surgery;  Laterality: N/A;     Current Outpatient Prescriptions  Medication Sig Dispense Refill  . acetaminophen (TYLENOL) 325 MG tablet Take 2 tablets (650 mg total) by mouth every 4 (four) hours as needed for mild pain, moderate pain or fever.    Marland Kitchen aspirin EC 325 MG EC tablet Take 1 tablet (325 mg total) by mouth daily. 30 tablet 0  . atorvastatin (LIPITOR) 40 MG tablet Take 1 tablet (40 mg total) by mouth daily. (Patient taking differently: Take 40 mg by mouth daily at 6 PM. ) 30 tablet 11  . calcium carbonate (TUMS - DOSED IN MG ELEMENTAL CALCIUM) 500 MG chewable tablet Chew 5 tablets by mouth daily.    . carvedilol (COREG) 6.25 MG tablet Take 0.5 tablets (3.125 mg total) by mouth 2 (two) times daily.    Marland Kitchen esomeprazole (NEXIUM) 40 MG capsule Take 20 mg by mouth daily at 12 noon.     Marland Kitchen LORazepam (ATIVAN) 0.5 MG tablet Take 1 tablet (0.5 mg total) by mouth at bedtime. 10 tablet 0  . Multiple Vitamins-Iron (MULTIVITAMIN/IRON PO) Take 1 tablet by mouth daily.    . ondansetron (ZOFRAN-ODT) 4 MG disintegrating tablet Take 1 tablet (4 mg total) by mouth every 8 (eight) hours as needed for nausea or vomiting. 15 tablet 0  . potassium chloride (K-DUR) 10 MEQ tablet Take 1 tablet (10 mEq total) by mouth daily. 30 tablet 11  . senna (SENOKOT) 8.6 MG TABS tablet Take 1 tablet (8.6 mg total) by mouth 2 (two) times daily. 30 each 0  . sertraline (ZOLOFT) 100 MG tablet Take 100 mg by mouth daily with breakfast.      No current facility-administered medications for this visit.     Allergies:   Codeine and Penicillins    Social History:  The patient  reports that she quit smoking about 20 years ago. Her smoking use included Cigarettes. She has a 52.50 pack-year smoking history. She has never used smokeless tobacco. She reports that she does not drink alcohol or use drugs.   Family History:  The patient's family history includes Cancer in her father.    ROS:   Please see the history of present illness.    Review of Systems: Constitutional:  denies fever, chills, diaphoresis, appetite change and fatigue.  HEENT: denies photophobia, eye pain, redness, hearing loss, ear pain, congestion, sore throat, rhinorrhea, sneezing, neck pain, neck stiffness and tinnitus.  Respiratory: denies SOB, DOE, cough, chest tightness, and wheezing.  Cardiovascular: denies chest pain, palpitations and leg swelling.  Gastrointestinal: denies nausea, vomiting, abdominal pain, diarrhea, constipation, blood in stool.  Genitourinary: denies dysuria, urgency, frequency,  hematuria, flank pain and difficulty urinating.  Musculoskeletal: denies  myalgias, back pain, joint swelling, arthralgias and gait problem.   Skin: denies pallor, rash and wound.  Neurological: denies dizziness, seizures, syncope, weakness, light-headedness, numbness and headaches.   Hematological: denies adenopathy, easy bruising, personal or family bleeding history.  Psychiatric/ Behavioral: denies suicidal ideation, mood changes, confusion, nervousness, sleep disturbance and agitation.       All other systems are reviewed and negative.    PHYSICAL EXAM: VS:  BP (!) 144/70 (BP Location: Right Arm, Patient Position: Sitting, Cuff Size: Normal)   Pulse 94   Ht 5\' 2"  (1.575 m)   Wt 117 lb (53.1 kg)   SpO2 96%   BMI 21.40 kg/m  , BMI Body mass index is 21.4 kg/m. GEN: Well nourished, well developed, in no acute distress  HEENT: normal  Neck: no JVD, carotid bruits, or masses Cardiac: RRR; no murmurs, rubs, or gallops,no edema  Respiratory:  clear to auscultation bilaterally, normal work of breathing GI: soft, + abdomina bruit,  pulsitile mass  MS: no deformity or atrophy  Skin: warm and dry, no rash Neuro:  Strength and sensation are intact Psych: normal   EKG:  EKG is not ordered today.   Recent Labs: 10/20/2016: ALT 9 10/23/2016: BUN <5; Creatinine, Ser 0.40; Hemoglobin 9.6; Magnesium  1.8; Platelets 388; Potassium 3.6; Sodium 138    Lipid Panel    Component Value Date/Time   CHOL 199 09/01/2012 1429   TRIG 302 (H) 09/01/2012 1429   HDL 42 09/01/2012 1429   CHOLHDL 4.7 09/01/2012 1429   VLDL 60 (H) 09/01/2012 1429   LDLCALC 97 09/01/2012 1429   LDLDIRECT 82 03/23/2009 2152      Wt Readings from Last 3 Encounters:  11/07/16 117 lb (53.1 kg)  10/15/16 122 lb 5.7 oz (55.5 kg)  09/16/16 122 lb (55.3 kg)      Other studies Reviewed: Additional studies/ records that were reviewed today include: . Review of the above records demonstrates:    ASSESSMENT AND PLAN:  1.  AAA -   She's now status post abdominal aortic aneurysm repair. She has not seen Dr. Edilia Boickson for follow-up yet.  She missed her appointment because of emergent gallbladder surgery and cholecystitis. I've encouraged her to call Dr. Adele Danickson's office today and arrange for an appointment. She has great distal foot pulses today.    2. CAD :   She had an abnormal Myoview study and subsequent cardiac catheterization revealed significant coronary artery disease. She's now status post coronary artery bypass grafting  Left internal mammary graft to the LAD  Right internal mammary graft to the PDA  SVG to diagonal  SVG to OM Healing well.   3. Essential hypertension:  HCTZ was added to her medication list several weeks ago. We will check a basic metabolic profile today. Continue current medications. Her blood pressure is normal. She's not having any edema.   Current medicines are reviewed at length with the patient today.  The patient does not have concerns regarding medicines.  The following changes have been made:  no change  Labs/ tests ordered today include:  No orders of the defined types were placed in this encounter.    Disposition:   FU with me in 3  months      Kristeen MissPhilip Hopie Pellegrin, MD  11/07/2016 8:26 AM    [facility] [address], [place], [place]   [address] Phone: [phone]; Fax: [phone]  [employer]  [address] [place], [place]  [address] [phone]   Fax [phone]

## 2016-11-07 NOTE — Patient Instructions (Signed)
Medication Instructions:  Your physician recommends that you continue on your current medications as directed. Please refer to the Current Medication list given to you today.   Labwork: Your physician recommends that you return for lab work in: TODAY Designer, jewellery(Bmet)   Testing/Procedures: none  Follow-Up: Your physician recommends that you schedule a follow-up appointment in: 3 months with Dr. Elease HashimotoNahser.   Any Other Special Instructions Will Be Listed Below (If Applicable).     If you need a refill on your cardiac medications before your next appointment, please call your pharmacy.

## 2017-01-11 ENCOUNTER — Encounter (HOSPITAL_COMMUNITY): Payer: Self-pay | Admitting: Nurse Practitioner

## 2017-01-11 ENCOUNTER — Emergency Department (HOSPITAL_COMMUNITY)
Admission: EM | Admit: 2017-01-11 | Discharge: 2017-01-11 | Disposition: A | Discharge status: Home / Self Care | Payer: Medicare HMO | Attending: Emergency Medicine | Admitting: Emergency Medicine

## 2017-01-11 ENCOUNTER — Emergency Department (HOSPITAL_COMMUNITY): Payer: Medicare HMO

## 2017-01-11 DIAGNOSIS — I10 Essential (primary) hypertension: Secondary | ICD-10-CM | POA: Insufficient documentation

## 2017-01-11 DIAGNOSIS — Y939 Activity, unspecified: Secondary | ICD-10-CM | POA: Diagnosis not present

## 2017-01-11 DIAGNOSIS — Y999 Unspecified external cause status: Secondary | ICD-10-CM | POA: Insufficient documentation

## 2017-01-11 DIAGNOSIS — Y929 Unspecified place or not applicable: Secondary | ICD-10-CM | POA: Insufficient documentation

## 2017-01-11 DIAGNOSIS — Z79899 Other long term (current) drug therapy: Secondary | ICD-10-CM | POA: Diagnosis not present

## 2017-01-11 DIAGNOSIS — S0181XA Laceration without foreign body of other part of head, initial encounter: Secondary | ICD-10-CM | POA: Insufficient documentation

## 2017-01-11 DIAGNOSIS — Z7982 Long term (current) use of aspirin: Secondary | ICD-10-CM | POA: Diagnosis not present

## 2017-01-11 DIAGNOSIS — Z87891 Personal history of nicotine dependence: Secondary | ICD-10-CM | POA: Diagnosis not present

## 2017-01-11 DIAGNOSIS — I2510 Atherosclerotic heart disease of native coronary artery without angina pectoris: Secondary | ICD-10-CM | POA: Diagnosis not present

## 2017-01-11 DIAGNOSIS — S0990XA Unspecified injury of head, initial encounter: Secondary | ICD-10-CM | POA: Diagnosis present

## 2017-01-11 DIAGNOSIS — S0101XA Laceration without foreign body of scalp, initial encounter: Secondary | ICD-10-CM | POA: Insufficient documentation

## 2017-01-11 DIAGNOSIS — Z951 Presence of aortocoronary bypass graft: Secondary | ICD-10-CM | POA: Insufficient documentation

## 2017-01-11 DIAGNOSIS — W01119A Fall on same level from slipping, tripping and stumbling with subsequent striking against unspecified sharp object, initial encounter: Secondary | ICD-10-CM | POA: Insufficient documentation

## 2017-01-11 DIAGNOSIS — S0191XA Laceration without foreign body of unspecified part of head, initial encounter: Secondary | ICD-10-CM

## 2017-01-11 NOTE — Discharge Instructions (Signed)
Staple removal in 10 days with her primary care physician.

## 2017-01-11 NOTE — ED Provider Notes (Addendum)
[facility]-EMERGENCY DEPT Provider Note   CSN: [id number] Arrival date & time: 01/11/17  1734     History   Chief Complaint Chief Complaint  Patient presents with  . Head Injury    HPI Nicole Dean is a 76 y.o. female.Chief complaint fall, scalp laceration  HPI:  Patient presents after a fall at home. She states she was asleep. Her phone rang. She got up quickly. She states that she caught her foot and ankle against the edge of her recliner. She went backwards and struck the back of her head against her grandfather clock. No loss of consciousness. She has full recollection for the events. No neurological symptoms. No neck pain. Has bleeding from a left parietal-occipital scalp laceration. Is not anticoagulated.  Past Medical History:  Diagnosis Date  . AAA (abdominal aortic aneurysm) (HCC)    VVS- following   . Anemia   . Anxiety   . Arthritis    hands  . Cholecystitis 10/2016  . Coronary artery disease   . Depression   . GERD (gastroesophageal reflux disease)   . History of bronchitis   . History of pneumonia   . Hyperlipidemia   . Hypertension   . Numbness and tingling of right leg   . Shortness of breath dyspnea    sometimes even with resting; pt. denies since heart surgery  . Wears glasses     Patient Active Problem List   Diagnosis Date Noted  . Acute gangrenous cholecystitis 10/15/2016  . Protein-calorie malnutrition, severe 10/15/2016  . Acute cholecystitis 10/14/2016  . Generalized abdominal pain   . Status post AAA (abdominal aortic aneurysm) repair   . Coronary artery disease involving coronary bypass graft of native heart without angina pectoris   . Depression   . Anxiety state   . SOB (shortness of breath)   . Atherosclerosis of native artery of both lower extremities with intermittent claudication (HCC)   . Acute blood loss anemia   . Hypotension   . Tachypnea   . Leukocytosis   . Hyponatremia   . Abdominal aortic aneurysm without rupture (HCC)  07/22/2016  . CAD (coronary artery disease) 06/04/2016  . 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 Abnormal nuclear stress test 03/31/2016  . AAA (abdominal aortic aneurysm) (HCC) 03/25/2016  . Atherosclerotic peripheral vascular disease (HCC) 03/03/2016  . Depression with anxiety 08/09/2012  . DEGENERATIVE JOINT DISEASE 01/30/2010  . OSTEOPENIA 12/08/2007  . INSOMNIA, CHRONIC 08/30/2007  . DISORDER, DEPRESSIVE NEC 08/30/2007  . GERD 08/30/2007  . DIVERTICULAR DISEASE 08/30/2007  . HYPERLIPIDEMIA, MIXED 03/09/2007  . HYPERTENSION, BENIGN ESSENTIAL 03/09/2007    Past Surgical History:  Procedure Laterality Date  . ABDOMINAL AORTIC ANEURYSM REPAIR N/A 07/22/2016   Procedure: JUXTARENAL  ABDOMINAL AORTIC ANEURYSM REPAIR;  Surgeon: Chuck Hint, MD;  Location: [facility] OR;  Service: Vascular;  Laterality: N/A;  . AORTA - BILATERAL FEMORAL ARTERY BYPASS GRAFT N/A 07/22/2016   Procedure: AORTA BIFEMORAL BYPASS GRAFT;  Surgeon: Chuck Hint, MD;  Location: [facility] OR;  Service: Vascular;  Laterality: N/A;  . CARDIAC CATHETERIZATION N/A 03/31/2016   Procedure: Left Heart Cath and Coronary Angiography;  Surgeon: Peter M Swaziland, MD;  [facility]: [facility] INVASIVE CV LAB;  Service: Cardiovascular;  Laterality: N/A;  . CHOLECYSTECTOMY N/A 10/15/2016   Procedure: LAPAROSCOPIC CONVERTED TO OPEN CHOLECYSTECTOMY;  Surgeon: Claud Kelp, MD;  Location: [facility] OR;  Service: General;  Laterality: N/A;  . CHOLECYSTECTOMY, LAPAROSCOPIC  10/15/2016  . COLONOSCOPY    . CORONARY ARTERY BYPASS  GRAFT N/A 04/04/2016   Procedure: CORONARY ARTERY BYPASS GRAFTING (CABG) x 4 using bilateral internal mammary arteries and right saphenous vein harvested by endovein;  Surgeon: Alleen Borne, MD;  Location: [facility] OR;  Service: Open Heart Surgery;  Laterality: N/A;  . EYE SURGERY Bilateral    cataracts removed./ IOL  . PERIPHERAL VASCULAR CATHETERIZATION N/A 03/03/2016   Procedure: Abdominal Aortogram w/Lower Extremity;  Surgeon: Chuck Hint, MD;  Location: [facility] INVASIVE CV LAB;  Service: Cardiovascular;  Laterality: N/A;  . REPAIR ILIAC ARTERY Right 07/22/2016   Procedure: RIGHT COMMON ILIAC ANEURYSM REPAIR;  Surgeon: Chuck Hint, MD;  Location: [facility] OR;  Service: Vascular;  Laterality: Right;  . SHOULDER ARTHROSCOPY W/ ROTATOR CUFF REPAIR Right   . TEE WITHOUT CARDIOVERSION N/A 04/04/2016   Procedure: TRANSESOPHAGEAL ECHOCARDIOGRAM (TEE);  Surgeon: Alleen Borne, MD;  Location: [facility] OR;  Service: Open Heart Surgery;  Laterality: N/A;    OB History    No data available       Home Medications    Prior to Admission medications   Medication Sig Start Date End Date Taking? Authorizing Provider  acetaminophen (TYLENOL) 325 MG tablet Take 2 tablets (650 mg total) by mouth every 4 (four) hours as needed for mild pain, moderate pain or fever. 10/22/16   Elease Etienne, MD  aspirin EC 325 MG EC tablet Take 1 tablet (325 mg total) by mouth daily. 04/10/16   Erin R Barrett, PA-C  atorvastatin (LIPITOR) 40 MG tablet Take 1 tablet (40 mg total) by mouth daily. Patient taking differently: Take 40 mg by mouth daily at 6 PM.  03/25/16   Vesta Mixer, MD  calcium carbonate (TUMS - DOSED IN MG ELEMENTAL CALCIUM) 500 MG chewable tablet Chew 5 tablets by mouth daily.    Historical Provider, MD  carvedilol (COREG) 6.25 MG tablet Take 0.5 tablets (3.125 mg total) by mouth 2 (two) times daily. 10/22/16   Elease Etienne, MD  esomeprazole (NEXIUM) 40 MG capsule Take 20 mg by mouth daily at 12 noon.     Historical Provider, MD  hydrochlorothiazide (HYDRODIURIL) 25 MG tablet Take 25 mg by mouth daily.    Historical Provider, MD  LORazepam (ATIVAN) 0.5 MG tablet Take 1 tablet (0.5 mg total) by mouth at bedtime. 10/22/16   Elease Etienne, MD  Multiple Vitamins-Iron (MULTIVITAMIN/IRON PO) Take 1 tablet by mouth daily.    Historical Provider, MD  ondansetron (ZOFRAN-ODT) 4 MG disintegrating tablet Take 1 tablet (4 mg total) by mouth every  8 (eight) hours as needed for nausea or vomiting. 10/22/16   Elease Etienne, MD  potassium chloride (K-DUR) 10 MEQ tablet Take 1 tablet (10 mEq total) by mouth daily. 06/05/16   Vesta Mixer, MD  senna (SENOKOT) 8.6 MG TABS tablet Take 1 tablet (8.6 mg total) by mouth 2 (two) times daily. 10/22/16   Elease Etienne, MD  sertraline (ZOLOFT) 100 MG tablet Take 100 mg by mouth daily with breakfast.  02/05/16   Historical Provider, MD    Family History Family History  Problem Relation Age of Onset  . Cancer Father     stomach    Social History Social History  Substance Use Topics  . Smoking status: Former Smoker    Packs/day: 1.50    Years: 35.00    Types: Cigarettes    Quit date: 02/15/1996  . Smokeless tobacco: Never Used  . Alcohol use No     Allergies  Codeine and Penicillins   Review of Systems Review of Systems  Constitutional: Negative for appetite change, chills, diaphoresis, fatigue and fever.  HENT: Negative for mouth sores, sore throat and trouble swallowing.         laceration  Eyes: Negative for visual disturbance.  Respiratory: Negative for cough, chest tightness, shortness of breath and wheezing.   Cardiovascular: Negative for chest pain.  Gastrointestinal: Negative for abdominal distention, abdominal pain, diarrhea, nausea and vomiting.  Endocrine: Negative for polydipsia, polyphagia and polyuria.  Genitourinary: Negative for dysuria, frequency and hematuria.  Musculoskeletal: Negative for gait problem.  Skin: Positive for wound. Negative for color change, pallor and rash.  Neurological: Negative for dizziness, syncope, light-headedness and headaches.  Hematological: Does not bruise/bleed easily.  Psychiatric/Behavioral: Negative for behavioral problems and confusion.     Physical Exam Updated Vital Signs BP 125/61 (BP Location: Left Arm)   Pulse 73   Temp 99 F (37.2 C) (Oral)   Resp 18   Ht 5\' 2"  (1.575 m)   Wt 118 lb (53.5 kg)   SpO2 95%    BMI 21.58 kg/m   Physical Exam  Constitutional: She is oriented to person, place, and time. She appears well-developed and well-nourished. No distress.  HENT:  Head: Normocephalic.    4 cm left parietal-occipital scalp laceration  Eyes: Conjunctivae are normal. Pupils are equal, round, and reactive to light. No scleral icterus.  Neck: Normal range of motion. Neck supple. No thyromegaly present.  Cardiovascular: Normal rate and regular rhythm.  Exam reveals no gallop and no friction rub.   No murmur heard. Pulmonary/Chest: Effort normal and breath sounds normal. No respiratory distress. She has no wheezes. She has no rales.  Abdominal: Soft. Bowel sounds are normal. She exhibits no distension. There is no tenderness. There is no rebound.  Musculoskeletal: Normal range of motion.  Neurological: She is alert and oriented to person, place, and time.  Skin: Skin is warm and dry. No rash noted.  Psychiatric: She has a normal mood and affect. Her behavior is normal.     ED Treatments / Results  Labs (all labs ordered are listed, but only abnormal results are displayed) Labs Reviewed - No data to display  EKG  EKG Interpretation None       Radiology Ct Head Wo Contrast  Result Date: 01/11/2017 CLINICAL DATA:  Fall, posterior head laceration EXAM: CT HEAD WITHOUT CONTRAST TECHNIQUE: Contiguous axial images were obtained from the base of the skull through the vertex without intravenous contrast. COMPARISON:  MRI brain dated 10/16/2016 FINDINGS: Brain: No evidence of acute infarction, hemorrhage, hydrocephalus, extra-axial collection or mass lesion/mass effect. Mild subcortical white matter and periventricular small vessel ischemic changes. Vascular: Intracranial atherosclerosis. Skull: Normal. Negative for fracture or focal lesion. Sinuses/Orbits: The visualized paranasal sinuses are essentially clear. The mastoid air cells are unopacified. Other: Soft tissue swelling/laceration/hematoma  overlying the left vertex (series 2/ image 26). IMPRESSION: Soft tissue swelling/laceration/hematoma overlying the left vertex. No evidence of calvarial fracture. No evidence of acute intracranial abnormality. Mild small vessel ischemic changes. Electronically Signed   By: Charline BillsSriyesh  Krishnan M.D.   On: 01/11/2017 19:15    Procedures Procedures (including critical care time)  Medications Ordered in ED Medications - No data to display   Initial Impression / Assessment and Plan / ED Course  I have reviewed the triage vital signs and the nursing notes.  Pertinent labs & imaging results that were available during my care of the patient were reviewed by me  and considered in my medical decision making (see chart for details).     LACERATION REPAIR Performed by: Claudean Kinds Authorized by: Claudean Kinds Consent: Verbal consent obtained. Risks and benefits: risks, benefits and alternatives were discussed Consent given by: patient Patient identity confirmed: provided demographic data Prepped and Draped in normal sterile fashion Wound explored  Laceration Location: Lt parietalscalp  Laceration Length: 4cm  No Foreign Bodies seen or palpated  Anesthesia: local infiltration  Local anesthetic: lidocaine 1% c epinephrine  Anesthetic total: 4 ml  Irrigation method: syringe Amount of cleaning: standard  Skin closure: staples  Number of sutures: 6  Technique: staples  Patient tolerance: Patient tolerated the procedure well with no immediate complications.   Final Clinical Impressions(s) / ED Diagnoses   Final diagnoses:  Laceration of head without foreign body, unspecified part of head, initial encounter  Laceration of scalp, initial encounter    CT normal. Laceration repaired without difficulty. Will follow up with primary care in 10 days for staple removal.  New Prescriptions New Prescriptions   No medications on file     Rolland Porter, MD 01/11/17 2051      Rolland Porter, MD 01/11/17 2052

## 2017-01-11 NOTE — ED Triage Notes (Signed)
Pt presents with c/o head injury. Laceration to top of scalp oozing blood. The injury occurred this afternoon after she woke from a nap suddenly to answer the phone and tripped over her shoes. She hit her head on the grandfather clock. She denies LOC, dizziness, nausea, neck pain, back pain. She reports pain at the site of laceration. She takes a daily aspirin

## 2017-01-11 NOTE — ED Notes (Signed)
No signature pad 

## 2017-01-30 ENCOUNTER — Encounter: Payer: Self-pay | Admitting: Cardiovascular Disease

## 2017-02-09 ENCOUNTER — Ambulatory Visit: Payer: Medicare HMO | Admitting: Cardiovascular Disease

## 2017-02-23 ENCOUNTER — Ambulatory Visit (INDEPENDENT_AMBULATORY_CARE_PROVIDER_SITE_OTHER): Payer: Medicare HMO | Admitting: Cardiovascular Disease

## 2017-02-23 ENCOUNTER — Encounter: Payer: Self-pay | Admitting: Cardiovascular Disease

## 2017-02-23 VITALS — BP 108/52 | HR 64 | Ht 62.0 in | Wt 126.0 lb

## 2017-02-23 DIAGNOSIS — I10 Essential (primary) hypertension: Secondary | ICD-10-CM | POA: Diagnosis not present

## 2017-02-23 DIAGNOSIS — I2510 Atherosclerotic heart disease of native coronary artery without angina pectoris: Secondary | ICD-10-CM

## 2017-02-23 MED ORDER — ASPIRIN EC 81 MG PO TBEC: 81.0000 mg | DELAYED_RELEASE_TABLET | Freq: Every day | ORAL | Status: DC

## 2017-02-23 NOTE — Progress Notes (Signed)
Cardiology Office Note   Date:  02/23/2017   ID:  Nicole, Dean 09-Aug-1941, MRN [id number]  PCP:  [facility]  Cardiologist:   Kristeen Miss, MD   Chief Complaint  Patient presents with  . Hypertension    Problem List 1. CAD - s/p CABG  04/04/16   Left internal mammary graft to the LAD  Right internal mammary graft to the PDA  SVG to diagonal        SVG to OM 2.  AAA - s/p repair with Ao- bi fem graft. 07/22/16 [name] 2. Essential HTN 3. Hyperlipidemia    Previous notes:  Nicole Dean is a 76 y.o. female who presents for pre-op eval prior to AAA repair Was seen with daughter , Nicole Dean .  She has known about this AAA for a while,   Was offered repair but she declined - too much going on Recently found that the AAA had enlarged Does not have any abdominal pain  - has bilateral groin pain with walking   Has leg pain and weakness with climbing stairs.  No real CP or dyspnea   Former smoker - quit 17 year ago .   June 04, 2016: She has had CABG by Dr. Laneta Simmers on May 5. She appears to be doing quite well. No CP , breathing is good.  She saw Carlean Jews. PA  on May 30. Her HCTZ was stopped at that time but Moana developed significant leg edema. The HCTZ has been restarted and she seems to be doing quite well.  Still has not had herr AAA repaired yet  Dec. 8, 2017:  Nicole Dean is seen with son, Nicole Dean .  CABG in May Had AAA repair in Aug Gall bladder surgery Nov. 15, 2017 Derrell Lolling )  Had  a wound vac .   Still draining.     February 23, 2017:  Overall doing well .   BP is well controlled. Still healing from her GB surgery  No CP ,  Past Medical History:  Diagnosis Date  . AAA (abdominal aortic aneurysm) (HCC)    VVS- following   . Anemia   . Anxiety   . Arthritis    hands  . Cholecystitis 10/2016  . Coronary artery disease   . Depression   . GERD (gastroesophageal reflux disease)   . History of bronchitis   .  History of pneumonia   . Hyperlipidemia   . Hypertension   . Numbness and tingling of right leg   . Shortness of breath dyspnea    sometimes even with resting; pt. denies since heart surgery  . Wears glasses     Past Surgical History:  Procedure Laterality Date  . ABDOMINAL AORTIC ANEURYSM REPAIR N/A 07/22/2016   Procedure: JUXTARENAL  ABDOMINAL AORTIC ANEURYSM REPAIR;  Surgeon: Chuck Hint, MD;  Location: [facility] OR;  Service: Vascular;  Laterality: N/A;  . AORTA - BILATERAL FEMORAL ARTERY BYPASS GRAFT N/A 07/22/2016   Procedure: AORTA BIFEMORAL BYPASS GRAFT;  Surgeon: Chuck Hint, MD;  Location: [facility] OR;  Service: Vascular;  Laterality: N/A;  . CARDIAC CATHETERIZATION N/A 03/31/2016   Procedure: Left Heart Cath and Coronary Angiography;  Surgeon: Peter M Swaziland, MD;  Location: [facility] INVASIVE CV LAB;  Service: Cardiovascular;  Laterality: N/A;  . CHOLECYSTECTOMY N/A 10/15/2016   Procedure: LAPAROSCOPIC CONVERTED TO OPEN CHOLECYSTECTOMY;  Surgeon: Claud Kelp, MD;  Location: [facility] OR;  Service: General;  Laterality: N/A;  . CHOLECYSTECTOMY,  LAPAROSCOPIC  10/15/2016  . COLONOSCOPY    . CORONARY ARTERY BYPASS GRAFT N/A 04/04/2016   Procedure: CORONARY ARTERY BYPASS GRAFTING (CABG) x 4 using bilateral internal mammary arteries and right saphenous vein harvested by endovein;  Surgeon: Alleen Borne, MD;  Location: [facility] OR;  Service: Open Heart Surgery;  Laterality: N/A;  . EYE SURGERY Bilateral    cataracts removed./ IOL  . PERIPHERAL VASCULAR CATHETERIZATION N/A 03/03/2016   Procedure: Abdominal Aortogram w/Lower Extremity;  Surgeon: Chuck Hint, MD;  Location: [facility] INVASIVE CV LAB;  Service: Cardiovascular;  Laterality: N/A;  . REPAIR ILIAC ARTERY Right 07/22/2016   Procedure: RIGHT COMMON ILIAC ANEURYSM REPAIR;  Surgeon: Chuck Hint, MD;  Location: [facility] OR;  Service: Vascular;  Laterality: Right;  . SHOULDER ARTHROSCOPY W/ ROTATOR CUFF REPAIR Right   . TEE WITHOUT  CARDIOVERSION N/A 04/04/2016   Procedure: TRANSESOPHAGEAL ECHOCARDIOGRAM (TEE);  Surgeon: Alleen Borne, MD;  Location: [facility] OR;  Service: Open Heart Surgery;  Laterality: N/A;     Current Outpatient Prescriptions  Medication Sig Dispense Refill  . acetaminophen (TYLENOL) 325 MG tablet Take 2 tablets (650 mg total) by mouth every 4 (four) hours as needed for mild pain, moderate pain or fever.    Marland Kitchen aspirin EC 325 MG EC tablet Take 1 tablet (325 mg total) by mouth daily. 30 tablet 0  . atorvastatin (LIPITOR) 40 MG tablet Take 1 tablet (40 mg total) by mouth daily. (Patient taking differently: Take 40 mg by mouth daily at 6 PM. ) 30 tablet 11  . calcium carbonate (TUMS - DOSED IN MG ELEMENTAL CALCIUM) 500 MG chewable tablet Chew 5 tablets by mouth daily.    . carvedilol (COREG) 6.25 MG tablet Take 0.5 tablets (3.125 mg total) by mouth 2 (two) times daily.    Marland Kitchen esomeprazole (NEXIUM) 40 MG capsule Take 20 mg by mouth daily at 12 noon.     . hydrochlorothiazide (HYDRODIURIL) 25 MG tablet Take 25 mg by mouth daily.    Marland Kitchen LORazepam (ATIVAN) 0.5 MG tablet Take 1 tablet (0.5 mg total) by mouth at bedtime. 10 tablet 0  . Multiple Vitamins-Iron (MULTIVITAMIN/IRON PO) Take 1 tablet by mouth daily.    . potassium chloride (K-DUR) 10 MEQ tablet Take 1 tablet (10 mEq total) by mouth daily. 30 tablet 11  . senna (SENOKOT) 8.6 MG TABS tablet Take 1 tablet (8.6 mg total) by mouth 2 (two) times daily. 30 each 0  . sertraline (ZOLOFT) 100 MG tablet Take 100 mg by mouth daily with breakfast.      No current facility-administered medications for this visit.     Allergies:   Codeine and Penicillins    Social History:  The patient  reports that she quit smoking about 21 years ago. Her smoking use included Cigarettes. She has a 52.50 pack-year smoking history. She has never used smokeless tobacco. She reports that she does not drink alcohol or use drugs.   Family History:  The patient's family history includes Cancer  in her father.    ROS:  Please see the history of present illness.    Review of Systems: Constitutional:  denies fever, chills, diaphoresis, appetite change and fatigue.  HEENT: denies photophobia, eye pain, redness, hearing loss, ear pain, congestion, sore throat, rhinorrhea, sneezing, neck pain, neck stiffness and tinnitus.  Respiratory: denies SOB, DOE, cough, chest tightness, and wheezing.  Cardiovascular: denies chest pain, palpitations and leg swelling.  Gastrointestinal: denies nausea, vomiting, abdominal pain, diarrhea, constipation, blood in  stool.  Genitourinary: denies dysuria, urgency, frequency, hematuria, flank pain and difficulty urinating.  Musculoskeletal: denies  myalgias, back pain, joint swelling, arthralgias and gait problem.   Skin: denies pallor, rash and wound.  Neurological: denies dizziness, seizures, syncope, weakness, light-headedness, numbness and headaches.   Hematological: denies adenopathy, easy bruising, personal or family bleeding history.  Psychiatric/ Behavioral: denies suicidal ideation, mood changes, confusion, nervousness, sleep disturbance and agitation.       All other systems are reviewed and negative.    PHYSICAL EXAM: VS:  BP (!) 108/52 (BP Location: Left Arm, Patient Position: Sitting, Cuff Size: Normal)   Pulse 64   Ht 5\' 2"  (1.575 m)   Wt 126 lb (57.2 kg)   BMI 23.05 kg/m  , BMI Body mass index is 23.05 kg/m. GEN: Well nourished, well developed, in no acute distress  HEENT: normal  Neck: no JVD, carotid bruits, or masses Cardiac: RRR; no murmurs, rubs, or gallops,no edema  Respiratory:  clear to auscultation bilaterally, normal work of breathing GI: soft, + abdominal bruit,  pulsitile mass  MS: no deformity or atrophy  Skin: warm and dry, no rash Neuro:  Strength and sensation are intact Psych: normal   EKG:  EKG is not ordered today.   Recent Labs: 10/20/2016: ALT 9 10/23/2016: Hemoglobin 9.6; Magnesium 1.8; Platelets  388 11/07/2016: BUN 13; Creat 0.51; Potassium 4.4; Sodium 138    Lipid Panel    Component Value Date/Time   CHOL 199 09/01/2012 1429   TRIG 302 (H) 09/01/2012 1429   HDL 42 09/01/2012 1429   CHOLHDL 4.7 09/01/2012 1429   VLDL 60 (H) 09/01/2012 1429   LDLCALC 97 09/01/2012 1429   LDLDIRECT 82 03/23/2009 2152      Wt Readings from Last 3 Encounters:  02/23/17 126 lb (57.2 kg)  01/11/17 118 lb (53.5 kg)  11/07/16 117 lb (53.1 kg)      Other studies Reviewed: Additional studies/ records that were reviewed today include: . Review of the above records demonstrates:    ASSESSMENT AND PLAN:  1.  AAA -   She's now status post abdominal aortic aneurysm repair.    She has seen Dr. Edilia Boickson.    2. CAD :   She had an abnormal Myoview study and subsequent cardiac catheterization revealed significant coronary artery disease. She's now status post coronary artery bypass grafting  Left internal mammary graft to the LAD  Right internal mammary graft to the PDA  SVG to diagonal  SVG to OM  .   3. Essential hypertension:  Doing well BP is well controlled    Current medicines are reviewed at length with the patient today.  The patient does not have concerns regarding medicines.  The following changes have been made:  no change  Labs/ tests ordered today include:  No orders of the defined types were placed in this encounter.    Disposition:   FU with me in 1 year.     Kristeen MissPhilip Hearl Heikes, MD  02/23/2017 11:51 AM    [facility] [address], [place], [place]  [address] Phone: [phone]; Fax: [phone]

## 2017-02-23 NOTE — Patient Instructions (Signed)
Medication Instructions:  DECREASE Aspirin to 81 mg once daily   Labwork: TODAY - cholesterol, complete metabolic panel  Your physician recommends that you return for lab work in: 1 year on the day of or a few days before your office visit with Dr. Elease HashimotoNahser.  You will need to FAST for this appointment - nothing to eat or drink after midnight the night before except water.   Testing/Procedures: None Ordered   Follow-Up: Your physician wants you to follow-up in: 1 year with Dr. Elease HashimotoNahser.  You will receive a reminder letter in the mail two months in advance. If you don't receive a letter, please call our office to schedule the follow-up appointment.   If you need a refill on your cardiac medications before your next appointment, please call your pharmacy.   Thank you for choosing [facility]! Eligha BridegroomMichelle Zaakirah Kistner, RN [phone]

## 2017-02-24 ENCOUNTER — Telehealth: Payer: Self-pay | Admitting: Nurse Practitioner

## 2017-02-24 DIAGNOSIS — E782 Mixed hyperlipidemia: Secondary | ICD-10-CM

## 2017-02-24 DIAGNOSIS — E781 Pure hyperglyceridemia: Secondary | ICD-10-CM

## 2017-02-24 LAB — COMPREHENSIVE METABOLIC PANEL
ALT: 9 IU/L (ref 0–32)
AST: 17 IU/L (ref 0–40)
Albumin/Globulin Ratio: 1.6 (ref 1.2–2.2)
Albumin: 4 g/dL (ref 3.5–4.8)
Alkaline Phosphatase: 105 IU/L (ref 39–117)
BUN/Creatinine Ratio: 38 — ABNORMAL HIGH (ref 12–28)
BUN: 22 mg/dL (ref 8–27)
Bilirubin Total: <0.2 mg/dL (ref 0.0–1.2)
CALCIUM: 9.1 mg/dL (ref 8.7–10.3)
CO2: 24 mmol/L (ref 18–29)
CREATININE: 0.58 mg/dL (ref 0.57–1.00)
Chloride: 100 mmol/L (ref 96–106)
GFR calc Af Amer: 105 mL/min/{1.73_m2} (ref >59)
GFR, EST NON AFRICAN AMERICAN: 91 mL/min/{1.73_m2} (ref >59)
GLOBULIN, TOTAL: 2.5 g/dL (ref 1.5–4.5)
Glucose: 98 mg/dL (ref 65–99)
Potassium: 4.7 mmol/L (ref 3.5–5.2)
Sodium: 140 mmol/L (ref 134–144)
TOTAL PROTEIN: 6.5 g/dL (ref 6.0–8.5)

## 2017-02-24 LAB — LIPID PANEL
CHOL/HDL RATIO: 4.9 ratio — AB (ref 0.0–4.4)
CHOLESTEROL TOTAL: 192 mg/dL (ref 100–199)
HDL: 39 mg/dL — ABNORMAL LOW (ref >39)
LDL CALC: 100 mg/dL — AB (ref 0–99)
Triglycerides: 267 mg/dL — ABNORMAL HIGH (ref 0–149)
VLDL Cholesterol Cal: 53 mg/dL — ABNORMAL HIGH (ref 5–40)

## 2017-02-24 MED ORDER — FENOFIBRATE 145 MG PO TABS: 145.0000 mg | ORAL_TABLET | Freq: Every day | ORAL | 3 refills | Status: DC

## 2017-02-24 NOTE — Telephone Encounter (Signed)
Lab results and plan of care reviewed with patient who verbalized understanding and agreement. I got correct pharmacy information from patient and scheduled her for repeat lab on 6/27. She thanked me for the call.

## 2017-02-24 NOTE — Telephone Encounter (Signed)
-----   Message from Vesta MixerPhilip J Nahser, MD sent at 02/24/2017  8:50 AM EDT ----- Awilda Metrorigs are elevated.  Add fenofibrate 145 mg a day . Check labs in 3 months

## 2017-02-25 ENCOUNTER — Telehealth: Payer: Self-pay | Admitting: Cardiovascular Disease

## 2017-02-25 NOTE — Telephone Encounter (Signed)
Spoke with patient and advised her to continue atorvastatin with fenofibrate. She thanked me for the call.

## 2017-02-25 NOTE — Telephone Encounter (Signed)
Nicole Dean is wanting to know if she is supposed to take the old medicine(Atorvastatin 40mg )  w/the new(Fenofibrate 145mg )  or discontinue the old medication?  Thanks

## 2017-04-30 ENCOUNTER — Encounter: Payer: Self-pay | Admitting: Gastroenterology

## 2017-05-03 IMAGING — CT CT HEAD W/O CM
3 series · 16 of 47 positions shown, 19 images · non-contrast
Comparison: None.

CLINICAL DATA: A aphasia.  Left facial droop, improving.

EXAM:
CT HEAD WITHOUT CONTRAST
TECHNIQUE: Contiguous axial images were obtained from the base of the skull
through the vertex without intravenous contrast.

[Series 2: head 5.0 h30s · axial · 0.41mm/px · z∈[-114,+21]mm · 10 of 33 slices shown, 13 images]
[im 3/33  brain]
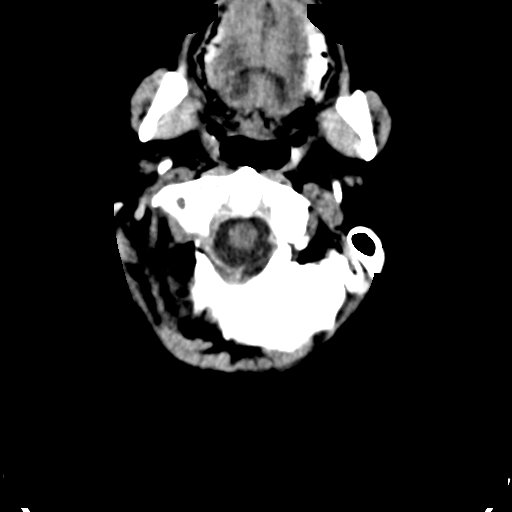
[im 3/33  bone]
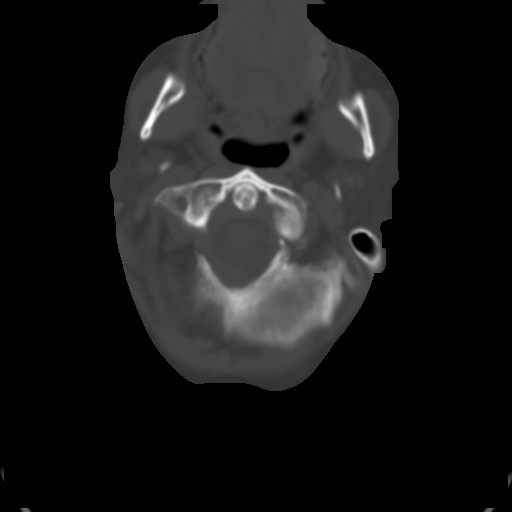
[im 6/33  brain]
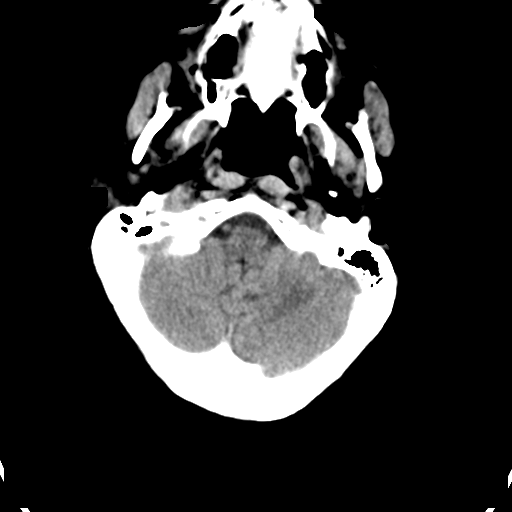
[im 9/33  brain]
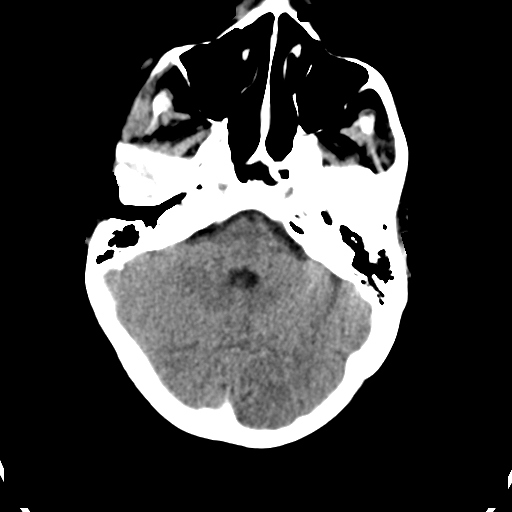
[im 12/33  brain]
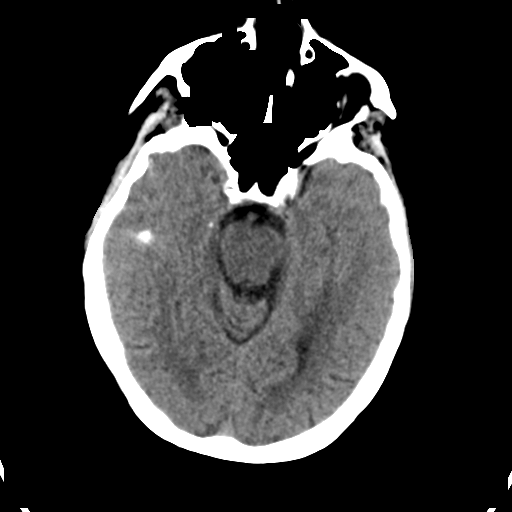
[im 15/33  brain]
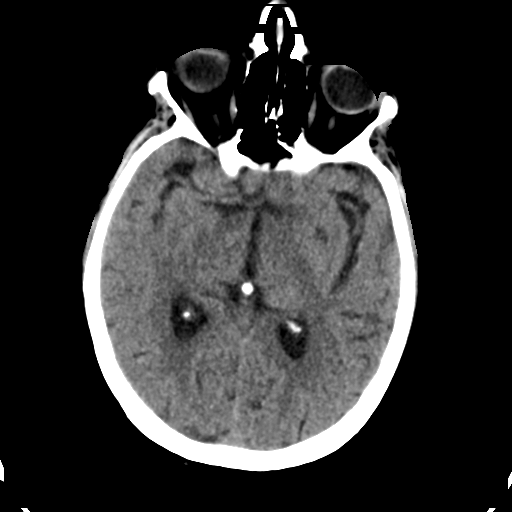
[im 15/33  bone]
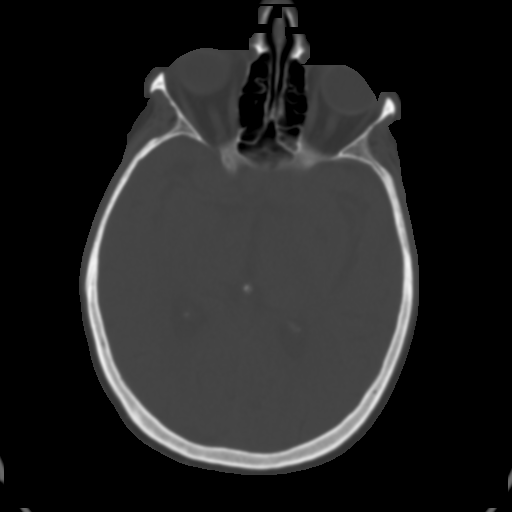
[im 18/33  brain]
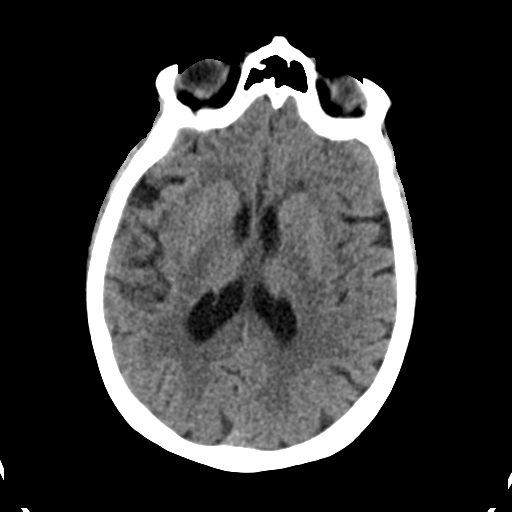
[im 21/33  brain]
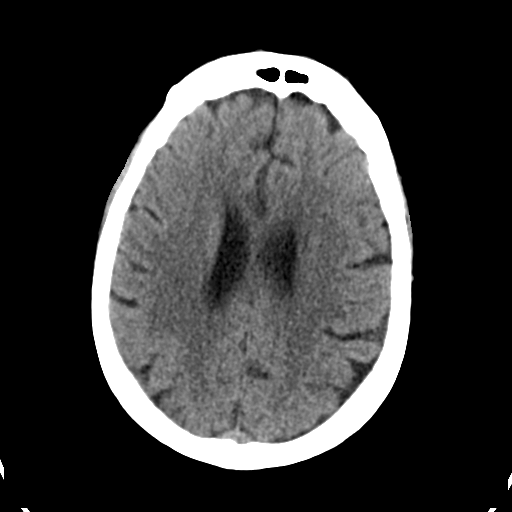
[im 25/33  brain]
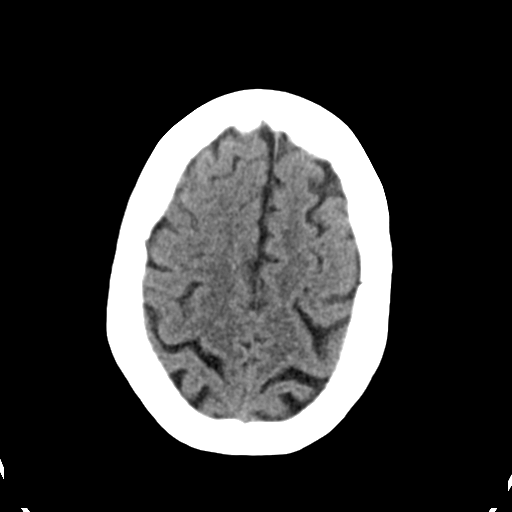
[im 27/33  brain]
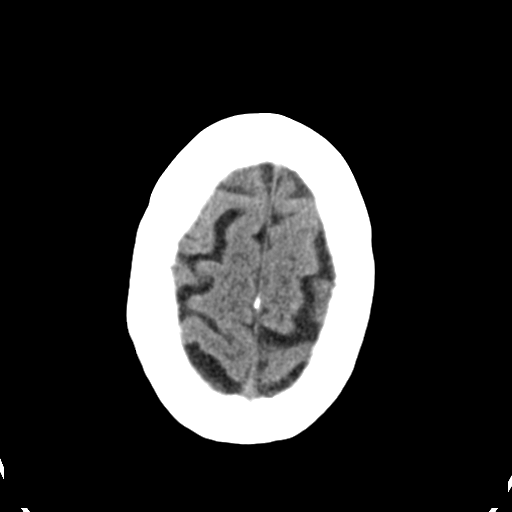
[im 27/33  bone]
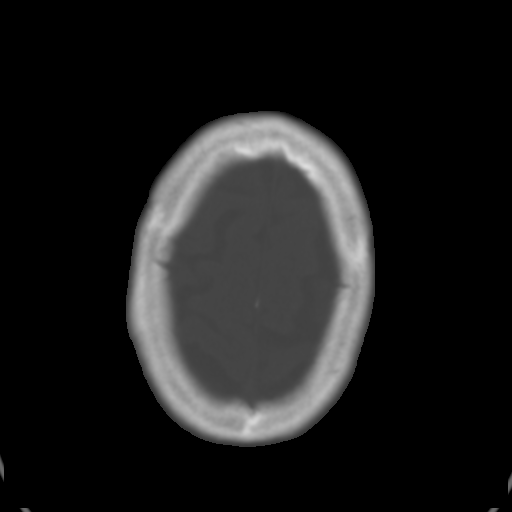
[im 30/33  brain]
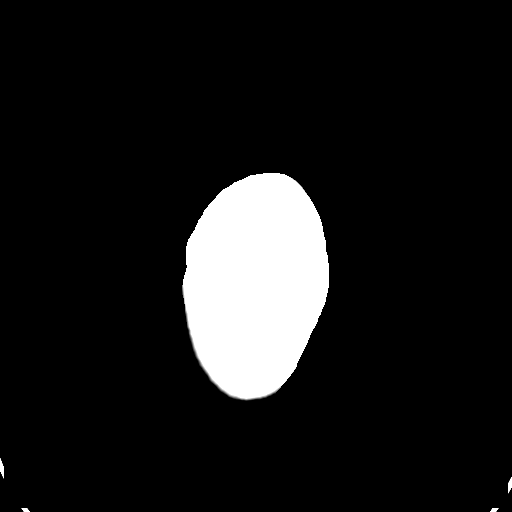

[Series 4: head 3.0 mpr cor · coronal · 0.30mm/px · 3 of 65 slices shown]
[im 22/65  brain]
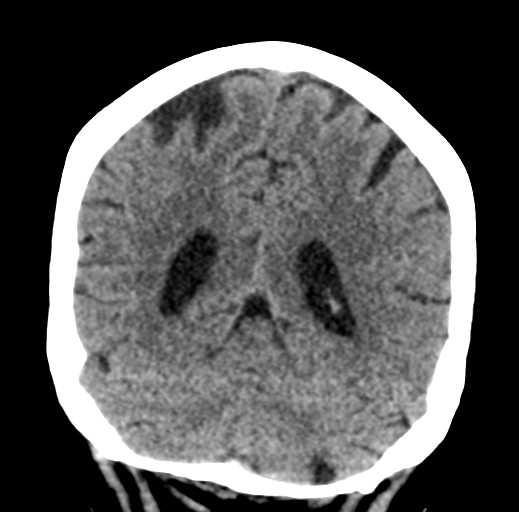
[im 29/65  brain]
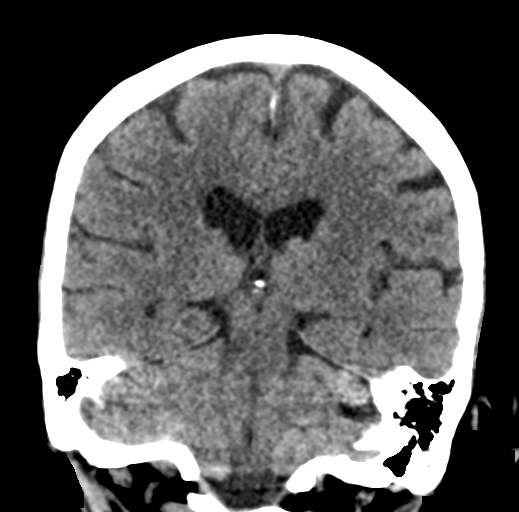
[im 36/65  brain]
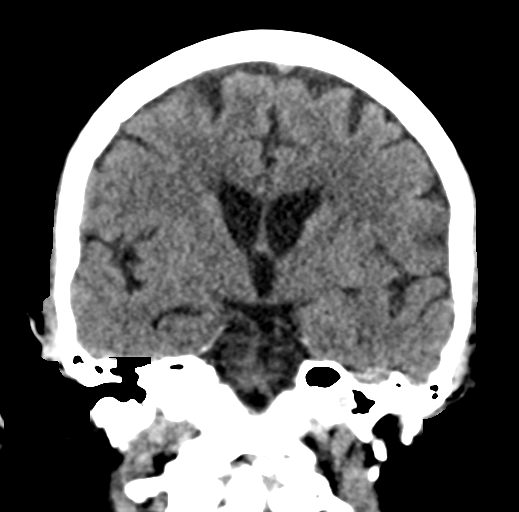

[Series 5: head 3.0 mpr sag · sagittal · 0.32mm/px · 3 of 46 slices shown]
[im 16/46  brain]
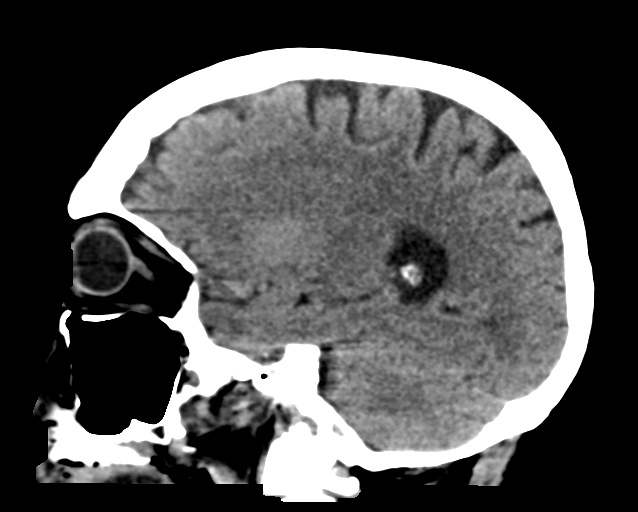
[im 23/46  brain]
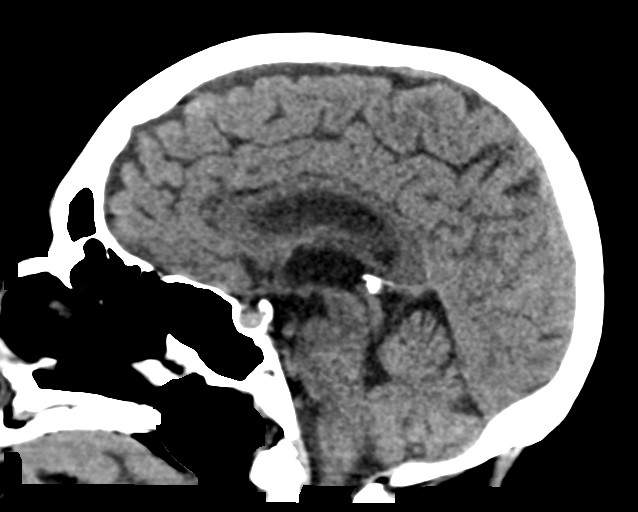
[im 31/46  brain]
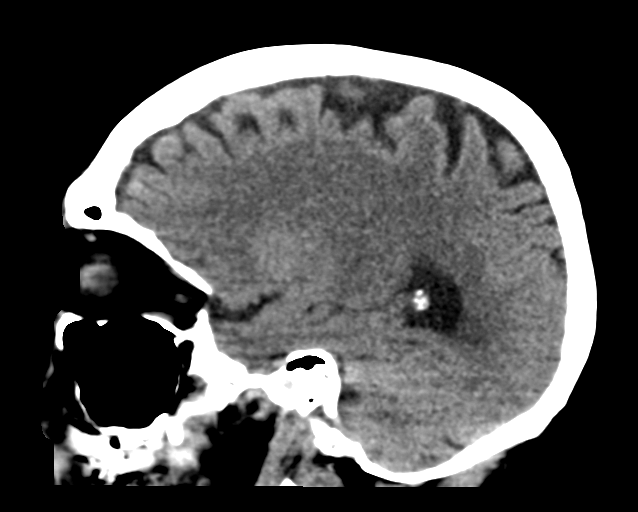

[16 of 47 positions shown; findings below may reference images not displayed]

FINDINGS: Brain: 2 oval areas of decreased density in the pons, 1 centrally on
the right and 1 anteriorly on the left. No intracranial hemorrhage
or mass effect. Small left inferior posterior fossa arachnoid cyst.

Vascular: No hyperdense vessel or unexpected calcification.

Skull: Normal. Negative for fracture or focal lesion.

Sinuses/Orbits: No acute finding.

Other: None.
IMPRESSION: 1. Possible subacute infarcts or areas of chronic small vessel
ischemic change in the pons on both sides.
2. Mild diffuse cerebral atrophy.

## 2017-05-19 ENCOUNTER — Encounter (INDEPENDENT_AMBULATORY_CARE_PROVIDER_SITE_OTHER): Payer: Self-pay

## 2017-05-19 ENCOUNTER — Ambulatory Visit (INDEPENDENT_AMBULATORY_CARE_PROVIDER_SITE_OTHER): Payer: Medicare HMO | Admitting: Physician Assistant

## 2017-05-19 ENCOUNTER — Encounter: Payer: Self-pay | Admitting: Physician Assistant

## 2017-05-19 VITALS — BP 108/52 | HR 68 | Ht 62.0 in | Wt 122.4 lb

## 2017-05-19 DIAGNOSIS — E782 Mixed hyperlipidemia: Secondary | ICD-10-CM

## 2017-05-19 DIAGNOSIS — I2510 Atherosclerotic heart disease of native coronary artery without angina pectoris: Secondary | ICD-10-CM

## 2017-05-19 NOTE — Patient Instructions (Signed)
Medication Instructions:  No changes.  See Gastroenterology next week.  If the doctor does not want you to stop your statin therapy (Lipitor), let us know.  I will change you to Crestor and recheck your labs in 2-3 months to see how your cholesterol does. If he takes you off of Lipitor, we will have to wait until he is ok with you resuming this drug before we can make a change. Stay off of the Fenofibrate.  Labwork: None   Testing/Procedures: None   Follow-Up: Dr. Delane GingerPhil Nahser as planned.   Any Other Special Instructions Will Be Listed Below (If Applicable).  If you need a refill on your cardiac medications before your next appointment, please call your pharmacy.

## 2017-05-19 NOTE — Progress Notes (Signed)
Cardiology Office Note:    Date:  05/19/2017   ID:  Nicole Dean, DOB 1941-08-30, MRN [id number]  PCP:  [facility]  Cardiologist:  Dr. Delane Ginger    Referring MD: Roe Coombs*   Chief Complaint  Patient presents with  . Hyperlipidemia    History of Present Illness:    Nicole Dean is a 76 y.o. female with a hx of CAD s/p CABG in 03/2016, AAA s/p repair in 07/2016, HTN, HL.  She is s/p cholecystectomy in 10/2016.  Last seen by Dr. Delane Ginger in 01/2017.    Ms. Nicole Dean returns for follow up on cholesterol.  Fenofibrate made her sick with nausea and her PCP stopped this a few weeks ago. She is having a lot of indigestion with some relief with proton pump inhibitor.  She has a referral to GI next week.  She denies chest pain, shortness of breath, syncope, orthopnea, paroxysmal nocturnal dyspnea, edema.  She does note some dysphagia.  She denies melena, hematochezia.    Prior CV studies:   The following studies were reviewed today:  Echo 10/2016 Mild LVH, EF 60-65, inf HK, Gr 1 DD, mild AI, trace MR/TR  Carotid US 03/2016 bilat ICA 1-39  LHC 03/2016 LM ost 70 LAD prox 90, mid 90 LCx ost 80 RCA prox 100 EF 55-65 Referred for coronary artery bypass grafting.   Past Medical History:  Diagnosis Date  . AAA (abdominal aortic aneurysm) (HCC)    VVS- following   . Anemia   . Anxiety   . Arthritis    hands  . Cholecystitis 10/2016  . Coronary artery disease   . Depression   . GERD (gastroesophageal reflux disease)   . History of bronchitis   . History of pneumonia   . Hyperlipidemia   . Hypertension   . Numbness and tingling of right leg   . Shortness of breath dyspnea    sometimes even with resting; pt. denies since heart surgery  . Wears glasses     Past Surgical History:  Procedure Laterality Date  . ABDOMINAL AORTIC ANEURYSM REPAIR N/A 07/22/2016   Procedure: JUXTARENAL  ABDOMINAL AORTIC ANEURYSM REPAIR;  Surgeon:  Chuck Hint, MD;  Location: [facility] OR;  Service: Vascular;  Laterality: N/A;  . AORTA - BILATERAL FEMORAL ARTERY BYPASS GRAFT N/A 07/22/2016   Procedure: AORTA BIFEMORAL BYPASS GRAFT;  Surgeon: Chuck Hint, MD;  Location: [facility] OR;  Service: Vascular;  Laterality: N/A;  . CARDIAC CATHETERIZATION N/A 03/31/2016   Procedure: Left Heart Cath and Coronary Angiography;  Surgeon: Peter M Swaziland, MD;  Location: [facility] INVASIVE CV LAB;  Service: Cardiovascular;  Laterality: N/A;  . CHOLECYSTECTOMY N/A 10/15/2016   Procedure: LAPAROSCOPIC CONVERTED TO OPEN CHOLECYSTECTOMY;  Surgeon: Claud Kelp, MD;  Location: [facility] OR;  Service: General;  Laterality: N/A;  . CHOLECYSTECTOMY, LAPAROSCOPIC  10/15/2016  . COLONOSCOPY    . CORONARY ARTERY BYPASS GRAFT N/A 04/04/2016   Procedure: CORONARY ARTERY BYPASS GRAFTING (CABG) x 4 using bilateral internal mammary arteries and right saphenous vein harvested by endovein;  Surgeon: Alleen Borne, MD;  Location: [facility] OR;  Service: Open Heart Surgery;  Laterality: N/A;  . EYE SURGERY Bilateral    cataracts removed./ IOL  . PERIPHERAL VASCULAR CATHETERIZATION N/A 03/03/2016   Procedure: Abdominal Aortogram w/Lower Extremity;  Surgeon: Chuck Hint, MD;  Location[facility] INVASIVE CV LAB;  Service: Cardiovascular;  Laterality: N/A;  . REPAIR ILIAC ARTERY Right 07/22/2016   Procedure: RIGHT  COMMON ILIAC ANEURYSM REPAIR;  Surgeon: Chuck Hint, MD;  Location: [facility] OR;  Service: Vascular;  Laterality: Right;  . SHOULDER ARTHROSCOPY W/ ROTATOR CUFF REPAIR Right   . TEE WITHOUT CARDIOVERSION N/A 04/04/2016   Procedure: TRANSESOPHAGEAL ECHOCARDIOGRAM (TEE);  Surgeon: Alleen Borne, MD;  Location: [facility] OR;  Service: Open Heart Surgery;  Laterality: N/A;    Current Medications: Current Meds  Medication Sig  . acetaminophen (TYLENOL) 325 MG tablet Take 2 tablets (650 mg total) by mouth every 4 (four) hours as needed for mild pain, moderate pain or fever.  Marland Kitchen aspirin EC  81 MG tablet Take 1 tablet (81 mg total) by mouth daily.  Marland Kitchen atorvastatin (LIPITOR) 40 MG tablet Take 1 tablet (40 mg total) by mouth daily.  . calcium carbonate (TUMS - DOSED IN MG ELEMENTAL CALCIUM) 500 MG chewable tablet Chew 5 tablets by mouth daily.  . carvedilol (COREG) 6.25 MG tablet Take 0.5 tablets (3.125 mg total) by mouth 2 (two) times daily.  . enalapril (VASOTEC) 20 MG tablet Take 20 mg by mouth daily.  Marland Kitchen esomeprazole (NEXIUM) 40 MG capsule Take 20 mg by mouth daily at 12 noon.   . hydrochlorothiazide (HYDRODIURIL) 25 MG tablet Take 25 mg by mouth daily.  Marland Kitchen LORazepam (ATIVAN) 0.5 MG tablet Take 1 tablet (0.5 mg total) by mouth at bedtime.  . Multiple Vitamins-Iron (MULTIVITAMIN/IRON PO) Take 1 tablet by mouth daily.  . potassium chloride (K-DUR) 10 MEQ tablet Take 1 tablet (10 mEq total) by mouth daily.  Marland Kitchen senna (SENOKOT) 8.6 MG TABS tablet Take 1 tablet (8.6 mg total) by mouth 2 (two) times daily.  . sertraline (ZOLOFT) 100 MG tablet Take 100 mg by mouth daily with breakfast.      Allergies:   Codeine and Penicillins   Social History   Social History  . Marital status: Divorced    Spouse name: N/A  . Number of children: N/A  . Years of education: N/A   Social History Main Topics  . Smoking status: Former Smoker    Packs/day: 1.50    Years: 35.00    Types: Cigarettes    Quit date: 02/15/1996  . Smokeless tobacco: Never Used  . Alcohol use No  . Drug use: No  . Sexual activity: Not Asked   Other Topics Concern  . None   Social History Narrative  . None     Family Hx: The patient's family history includes Cancer in her father.  ROS:   Please see the history of present illness.    ROS All other systems reviewed and are negative.   EKGs/Labs/Other Test Reviewed:    EKG:  EKG is  ordered today.  The ekg ordered today demonstrates NSR, HR 68, normal axis, QTc 418 ms, no changes.   Recent Labs: 10/23/2016: Hemoglobin 9.6; Magnesium 1.8; Platelets  388 02/23/2017: ALT 9; BUN 22; Creatinine, Ser 0.58; Potassium 4.7; Sodium 140   Recent Lipid Panel Lab Results  Component Value Date/Time   CHOL 192 02/23/2017 12:10 PM   TRIG 267 (H) 02/23/2017 12:10 PM   HDL 39 (L) 02/23/2017 12:10 PM   CHOLHDL 4.9 (H) 02/23/2017 12:10 PM   CHOLHDL 4.7 09/01/2012 02:29 PM   LDLCALC 100 (H) 02/23/2017 12:10 PM   LDLDIRECT 82 03/23/2009 09:52 PM    Physical Exam:    VS:  BP (!) 108/52   Pulse 68   Ht 5\' 2"  (1.575 m)   Wt 122 lb 6.4 oz (55.5 kg)  BMI 22.39 kg/m     Wt Readings from Last 3 Encounters:  05/19/17 122 lb 6.4 oz (55.5 kg)  02/23/17 126 lb (57.2 kg)  01/11/17 118 lb (53.5 kg)     Physical Exam  Constitutional: She is oriented to person, place, and time. She appears well-developed and well-nourished. No distress.  HENT:  Head: Normocephalic and atraumatic.  Eyes: No scleral icterus.  Neck: No JVD present.  Cardiovascular: Normal rate and regular rhythm.   No murmur heard. Pulmonary/Chest: Effort normal. She has no wheezes. She has no rales.  Abdominal: Soft.  Musculoskeletal: She exhibits no edema.  Neurological: She is alert and oriented to person, place, and time.  Skin: Skin is warm and dry.  Psychiatric: She has a normal mood and affect.    ASSESSMENT:    1. HYPERLIPIDEMIA, MIXED   2. Coronary artery disease involving native coronary artery of native heart without angina pectoris    PLAN:    In order of problems listed above:  1. HYPERLIPIDEMIA, MIXED - She has a recent hx of cholecystectomy and has been having nausea with taking Fenofibrate.  Her symptoms are resolved off of Fenofibrate.  She had a recent LDL of 100 (goal < 70) and Trig's < 400.  She is still having a lot of GERD symptoms and sees GI next week.  I think she may do better on Rosuvastatin than Atorvastatin.  I will wait for her to see GI first.  If her statin Rx does not have to be placed on hold, we can change Atorvastatin to Rosuvastatin 20 mg QD  with FU labs in 2-3 mos.  2. Coronary artery disease  S/p CABG in 03/2016.  She denies angina.  Continue ASA, statin.   Dispo:  Return in about 9 months (around 02/16/2018) for Scheduled Follow Up, w/ Dr. Elease HashimotoNahser.   Medication Adjustments/Labs and Tests Ordered: Current medicines are reviewed at length with the patient today.  Concerns regarding medicines are outlined above.  Orders/Tests:  No orders of the defined types were placed in this encounter.  Medication changes: No orders of the defined types were placed in this encounter.  Signed, Tereso NewcomerScott Lenorris Karger, PA-C  05/19/2017 5:28 PM    [facility] [address], [place], [place]  [address] Phone: [phone]; Fax: [phone]

## 2017-05-20 ENCOUNTER — Other Ambulatory Visit: Payer: Self-pay | Admitting: Physician Assistant

## 2017-05-20 NOTE — Addendum Note (Signed)
Addended by: Kerrie BuffaloANIELS, MICHELE on: 05/20/2017 11:32 AM   Modules accepted: Orders

## 2017-05-26 ENCOUNTER — Encounter: Payer: Self-pay | Admitting: Gastroenterology

## 2017-05-26 ENCOUNTER — Ambulatory Visit (INDEPENDENT_AMBULATORY_CARE_PROVIDER_SITE_OTHER): Payer: Medicare HMO | Admitting: Gastroenterology

## 2017-05-26 VITALS — BP 128/60 | HR 60 | Ht 62.0 in | Wt 123.0 lb

## 2017-05-26 DIAGNOSIS — R1010 Upper abdominal pain, unspecified: Secondary | ICD-10-CM | POA: Diagnosis not present

## 2017-05-26 DIAGNOSIS — R110 Nausea: Secondary | ICD-10-CM

## 2017-05-26 DIAGNOSIS — R1310 Dysphagia, unspecified: Secondary | ICD-10-CM

## 2017-05-26 DIAGNOSIS — K219 Gastro-esophageal reflux disease without esophagitis: Secondary | ICD-10-CM | POA: Diagnosis not present

## 2017-05-26 NOTE — Progress Notes (Signed)
HPI :  76 y/o female with a history CAD s/p CABG, AAA s/p repair, cholecystitis s/p cholecystectomy, seen here in consultation, referred by Tereso NewcomerScott Weaver PA, for new patient evaluation today for dysphagia, nausea, abdominal pain, r.  She reports problems with chronic nausea and indigestion. Nausea comes and goes, usually after she eats she will feel nauseated. She does endorse some dysphagia - food doesn't go down right, can get caught up in the sternal notch ongoing for a few weeks. She reports no issues with liquids, dysphagia only occurs with solids. She does endorse heartburn. She can wake up with regurgitation when she sleeps. She has been on nexium for a long time, but taking prilosec more recently which has worked better recently dosed at 40mg  once daily. She thinks it is more controlled than when on nexium but she still has some breakthrough. She is not having much vomiting lately. She does have some epigastric pain which bothers her after she eats. She has had her gallbladder removed, in November, she has had some discomfort in the RUQ since which persists. She reports 24/7 discomfort which never goes away in the RUQ, tender over the abdominal scar.   She had been taking fenofibrate, which caused nausea. She stopped that and felt better. She tried taking it again and it made her nauseated, she has stopped it completely since that time. She is taking lipitor and was wondering if there was a relation to her symptoms, she notices no nausea at all when on lipitor. No trouble with her bowels or blood in the stools. Taking zofran as needed for nausea which helps. No headaches.   Brother and father had gastric cancer. No colon cancer in the family. She denies NSAID use.  She has had a remote EGD with Dr. Claretha CooperMagood in 2001, not sure what this showed.   Colonoscopy done one time, she thinks around 2000 and will never have another one, states it was the "worst experience of her life".    Past Medical  History:  Diagnosis Date  . AAA (abdominal aortic aneurysm) (HCC)    VVS- following   . Anemia   . Anxiety   . Arthritis    hands  . Cholecystitis 10/2016  . Coronary artery disease   . Depression   . GERD (gastroesophageal reflux disease)   . History of bronchitis   . History of pneumonia   . Hyperlipidemia   . Hypertension   . Numbness and tingling of right leg   . Shortness of breath dyspnea    sometimes even with resting; pt. denies since heart surgery  . Wears glasses      Past Surgical History:  Procedure Laterality Date  . ABDOMINAL AORTIC ANEURYSM REPAIR N/A 07/22/2016   Procedure: JUXTARENAL  ABDOMINAL AORTIC ANEURYSM REPAIR;  Surgeon: Chuck Hinthristopher S Dickson, MD;  [facility];  Service: Vascular;  Laterality: N/A;  . AORTA - BILATERAL FEMORAL ARTERY BYPASS GRAFT N/A 07/22/2016   Procedure: AORTA BIFEMORAL BYPASS GRAFT;  Surgeon: Chuck Hinthristopher S Dickson, MD;  [facility];  Service: Vascular;  Laterality: N/A;  . CARDIAC CATHETERIZATION N/A 03/31/2016   Procedure: Left Heart Cath and Coronary Angiography;  Surgeon: Peter M SwazilandJordan, MD;  Location: [facility] INVASIVE CV LAB;  Service: Cardiovascular;  Laterality: N/A;  . CHOLECYSTECTOMY N/A 10/15/2016   Procedure: LAPAROSCOPIC CONVERTED TO OPEN CHOLECYSTECTOMY;  Surgeon: Claud KelpHaywood Ingram, MD;  Location: [facility] OR;  Service: General;  Laterality: N/A;  . CHOLECYSTECTOMY, LAPAROSCOPIC  10/15/2016  . COLONOSCOPY    .  CORONARY ARTERY BYPASS GRAFT N/A 04/04/2016   Procedure: CORONARY ARTERY BYPASS GRAFTING (CABG) x 4 using bilateral internal mammary arteries and right saphenous vein harvested by endovein;  Surgeon: Alleen Borne, MD;  Location: [facility] OR;  Service: Open Heart Surgery;  Laterality: N/A;  . EYE SURGERY Bilateral    cataracts removed./ IOL  . PERIPHERAL VASCULAR CATHETERIZATION N/A 03/03/2016   Procedure: Abdominal Aortogram w/Lower Extremity;  Surgeon: Chuck Hint, MD;  Location[facility] INVASIVE CV LAB;  Service:  Cardiovascular;  Laterality: N/A;  . REPAIR ILIAC ARTERY Right 07/22/2016   Procedure: RIGHT COMMON ILIAC ANEURYSM REPAIR;  Surgeon: Chuck Hint, MD;  Location: [facility] OR;  Service: Vascular;  Laterality: Right;  . SHOULDER ARTHROSCOPY W/ ROTATOR CUFF REPAIR Right   . TEE WITHOUT CARDIOVERSION N/A 04/04/2016   Procedure: TRANSESOPHAGEAL ECHOCARDIOGRAM (TEE);  Surgeon: Alleen Borne, MD;  Location[facility] OR;  Service: Open Heart Surgery;  Laterality: N/A;   Family History  Problem Relation Age of Onset  . Cancer Father        stomach  . Heart disease Maternal Grandfather   . Stomach cancer Brother   . Heart disease Maternal Uncle    Social History  Substance Use Topics  . Smoking status: Former Smoker    Packs/day: 1.50    Years: 35.00    Types: Cigarettes    Quit date: 02/15/1996  . Smokeless tobacco: Never Used  . Alcohol use No   Current Outpatient Prescriptions  Medication Sig Dispense Refill  . acetaminophen (TYLENOL) 325 MG tablet Take 2 tablets (650 mg total) by mouth every 4 (four) hours as needed for mild pain, moderate pain or fever.    Marland Kitchen aspirin EC 81 MG tablet Take 1 tablet (81 mg total) by mouth daily.    Marland Kitchen atorvastatin (LIPITOR) 40 MG tablet Take 1 tablet (40 mg total) by mouth daily. 30 tablet 11  . calcium carbonate (TUMS - DOSED IN MG ELEMENTAL CALCIUM) 500 MG chewable tablet Chew 5 tablets by mouth daily.    . carvedilol (COREG) 6.25 MG tablet Take 0.5 tablets (3.125 mg total) by mouth 2 (two) times daily with a meal. 90 tablet 3  . enalapril (VASOTEC) 20 MG tablet Take 20 mg by mouth daily.    Marland Kitchen esomeprazole (NEXIUM) 40 MG capsule Take 20 mg by mouth daily at 12 noon.     . hydrochlorothiazide (HYDRODIURIL) 25 MG tablet Take 25 mg by mouth daily.    Marland Kitchen LORazepam (ATIVAN) 0.5 MG tablet Take 1 tablet (0.5 mg total) by mouth at bedtime. 10 tablet 0  . Multiple Vitamins-Iron (MULTIVITAMIN/IRON PO) Take 1 tablet by mouth daily.    . potassium chloride (K-DUR) 10 MEQ  tablet Take 1 tablet (10 mEq total) by mouth daily. 30 tablet 11  . senna (SENOKOT) 8.6 MG TABS tablet Take 1 tablet (8.6 mg total) by mouth 2 (two) times daily. 30 each 0  . sertraline (ZOLOFT) 100 MG tablet Take 100 mg by mouth daily with breakfast.      No current facility-administered medications for this visit.    Allergies  Allergen Reactions  . Codeine Nausea And Vomiting    Severe, ended up in ED  . Penicillins Nausea Only and Other (See Comments)    Has patient had a PCN reaction causing immediate rash, facial/tongue/throat swelling, SOB or lightheadedness with hypotension: Yes Has patient had a PCN reaction causing severe rash involving mucus membranes or skin necrosis: No Has patient had a PCN  reaction that required hospitalization No Has patient had a PCN reaction occurring within the last 10 years: No If all of the above answers are "NO", then may proceed with Cephalosporin use.      Review of Systems: All systems reviewed and negative except where noted in HPI.    Lab Results  Component Value Date   CREATININE 0.58 02/23/2017   BUN 22 02/23/2017   NA 140 02/23/2017   K 4.7 02/23/2017   CL 100 02/23/2017   CO2 24 02/23/2017    Lab Results  Component Value Date   ALT 9 02/23/2017   AST 17 02/23/2017   ALKPHOS 105 02/23/2017   BILITOT <0.2 02/23/2017     Physical Exam: BP 128/60   Pulse 60   Ht 5\' 2"  (1.575 m)   Wt 123 lb (55.8 kg)   BMI 22.50 kg/m  Constitutional: Pleasant,well-developed, female in no acute distress. HEENT: Normocephalic and atraumatic. Conjunctivae are normal. No scleral icterus. Neck supple.  Cardiovascular: Normal rate, regular rhythm.  Pulmonary/chest: Effort normal and breath sounds normal. No wheezing, rales or rhonchi. Abdominal: Soft, nondistended, tenderness along scar in RUQ with positive Carnett. There are no masses palpable. No hepatomegaly. Extremities: no edema Lymphadenopathy: No cervical adenopathy  noted. Neurological: Alert and oriented to person place and time. Skin: Skin is warm and dry. No rashes noted. Psychiatric: Normal mood and affect. Behavior is normal.   ASSESSMENT AND PLAN: 76 y/o female here for a new patient consultation for the following issues:  Dysphagia / GERD / nausea / epigastric pain - fenofibrate clearly precipitates nausea however this symptom has persisted despite stopping it. Now with progressive dysphagia to solids. I discussed differential of dysphagia with the patient and her daughter. I'm recommending an upper endoscopy to further evaluate and treat dysphagia and also evaluate her nausea, persistent reflux, abdominal pain / FH of gastric cancer. I discussed risks and benefits of endoscopy and anesthesia with the patient and her daughter, they wished to proceed. All questions answered. There was an opening for procedure tomorrow and they wished to proceed as soon as possible to get this done, further recommendations pending the result. In the interim, okay to continue her statin in regards to this question raised by cardiology, based on her history of don't think this is related at present time.  Abdominal wall / neuropathic pain - constant pain at the incision site of cholecystectomy since her surgery, positive Carnett sign on exam. Counseled her and abdominal wall pain, recommend trial of topical capsaicin cream to see if this helps. If not can consider lidocaine patches and/or trigger point injection.  Ileene Patrick, MD [facility] Pager [phone]  CC: Tereso Newcomer, PA

## 2017-05-26 NOTE — Patient Instructions (Signed)
If you are age 75 or older, your body mass index should be between 23-30. Your Body mass index is 22.5 kg/m. If this is out of the aforementioned range listed, please consider follow up with your Primary Care Provider.  If you are age 75 or younger, your body mass index should be between 19-25. Your Body mass index is 22.5 kg/m. If this is out of the aformentioned range listed, please consider follow up with your Primary Care Provider.   You have been scheduled for an endoscopy. Please follow written instructions given to you at your visit today. If you use inhalers (even only as needed), please bring them with you on the day of your procedure. Your physician has requested that you go to www.startemmi.com and enter the access code given to you at your visit today. This web site gives a general overview about your procedure. However, you should still follow specific instructions given to you by our office regarding your preparation for the procedure.  Please purchase Capcaisin cream over the counter and use as instructed.  Thank you.

## 2017-05-27 ENCOUNTER — Ambulatory Visit (AMBULATORY_SURGERY_CENTER): Payer: Medicare HMO | Admitting: Gastroenterology

## 2017-05-27 ENCOUNTER — Encounter: Payer: Self-pay | Admitting: Gastroenterology

## 2017-05-27 ENCOUNTER — Other Ambulatory Visit: Payer: Medicare HMO

## 2017-05-27 VITALS — BP 125/50 | HR 63 | Temp 97.8°F | Resp 18 | Ht 62.0 in | Wt 123.0 lb

## 2017-05-27 DIAGNOSIS — K259 Gastric ulcer, unspecified as acute or chronic, without hemorrhage or perforation: Secondary | ICD-10-CM | POA: Diagnosis not present

## 2017-05-27 DIAGNOSIS — R1310 Dysphagia, unspecified: Secondary | ICD-10-CM

## 2017-05-27 DIAGNOSIS — K2970 Gastritis, unspecified, without bleeding: Secondary | ICD-10-CM | POA: Diagnosis not present

## 2017-05-27 DIAGNOSIS — K3189 Other diseases of stomach and duodenum: Secondary | ICD-10-CM

## 2017-05-27 DIAGNOSIS — K2950 Unspecified chronic gastritis without bleeding: Secondary | ICD-10-CM

## 2017-05-27 DIAGNOSIS — K2990 Gastroduodenitis, unspecified, without bleeding: Secondary | ICD-10-CM | POA: Diagnosis not present

## 2017-05-27 HISTORY — DX: Gastric ulcer, unspecified as acute or chronic, without hemorrhage or perforation: K25.9

## 2017-05-27 MED ORDER — SODIUM CHLORIDE 0.9 % IV SOLN: 500.0000 mL | INTRAVENOUS | Status: DC

## 2017-05-27 MED ORDER — OMEPRAZOLE 40 MG PO CPDR: 40.0000 mg | DELAYED_RELEASE_CAPSULE | Freq: Two times a day (BID) | ORAL | 3 refills | Status: DC

## 2017-05-27 NOTE — Progress Notes (Signed)
Report to PACU, RN, vss, BBS= Clear.  

## 2017-05-27 NOTE — Patient Instructions (Signed)
Discharge instructions given. Handouts on a hiatal hernia and a dilatation diet. Hold all NSAIDS other than aspirin. Resume previous medications. YOU HAD AN ENDOSCOPIC PROCEDURE TODAY AT THE [facility]:   Refer to the procedure report that was given to you for any specific questions about what was found during the examination.  If the procedure report does not answer your questions, please call your gastroenterologist to clarify.  If you requested that your care partner not be given the details of your procedure findings, then the procedure report has been included in a sealed envelope for you to review at your convenience later.  YOU SHOULD EXPECT: Some feelings of bloating in the abdomen. Passage of more gas than usual.  Walking can help get rid of the air that was put into your GI tract during the procedure and reduce the bloating. If you had a lower endoscopy (such as a colonoscopy or flexible sigmoidoscopy) you may notice spotting of blood in your stool or on the toilet paper. If you underwent a bowel prep for your procedure, you may not have a normal bowel movement for a few days.  Please Note:  You might notice some irritation and congestion in your nose or some drainage.  This is from the oxygen used during your procedure.  There is no need for concern and it should clear up in a day or so.  SYMPTOMS TO REPORT IMMEDIATELY:   Following upper endoscopy (EGD)  Vomiting of blood or coffee ground material  New chest pain or pain under the shoulder blades  Painful or persistently difficult swallowing  New shortness of breath  Fever of 100F or higher  Black, tarry-looking stools  For urgent or emergent issues, a gastroenterologist can be reached at any hour by calling [phone].   DIET:  We do recommend a small meal at first, but then you may proceed to your regular diet.  Drink plenty of fluids but you should avoid alcoholic beverages for 24 hours.  ACTIVITY:  You  should plan to take it easy for the rest of today and you should NOT DRIVE or use heavy machinery until tomorrow (because of the sedation medicines used during the test).    FOLLOW UP: Our staff will call the number listed on your records the next business day following your procedure to check on you and address any questions or concerns that you may have regarding the information given to you following your procedure. If we do not reach you, we will leave a message.  However, if you are feeling well and you are not experiencing any problems, there is no need to return our call.  We will assume that you have returned to your regular daily activities without incident.  If any biopsies were taken you will be contacted by phone or by letter within the next 1-3 weeks.  Please call us at [phone] if you have not heard about the biopsies in 3 weeks.    SIGNATURES/CONFIDENTIALITY: You and/or your care partner have signed paperwork which will be entered into your electronic medical record.  These signatures attest to the fact that that the information above on your After Visit Summary has been reviewed and is understood.  Full responsibility of the confidentiality of this discharge information lies with you and/or your care-partner.

## 2017-05-27 NOTE — Op Note (Addendum)
[facility] Patient Name: Nicole ForehandJanice Dean Procedure Date: 05/27/2017 10:34 AM MRN: [id number] Endoscopist: Viviann SpareSteven P. Michaelyn Wall MD, MD Age: 76 Referring MD:  Date of Birth: 1941/03/04 Gender: Female Account #: [id number] Procedure:                Upper GI endoscopy Indications:              Epigastric abdominal pain, Dysphagia, Heartburn,                            Nausea Medicines:                Monitored Anesthesia Care Procedure:                Pre-Anesthesia Assessment:                           - Prior to the procedure, a History and Physical                            was performed, and patient medications and                            allergies were reviewed. The patient's tolerance of                            previous anesthesia was also reviewed. The risks                            and benefits of the procedure and the sedation                            options and risks were discussed with the patient.                            All questions were answered, and informed consent                            was obtained. Prior Anticoagulants: The patient has                            taken aspirin, last dose was 1 day prior to                            procedure. ASA Grade Assessment: III - A patient                            with severe systemic disease. After reviewing the                            risks and benefits, the patient was deemed in                            satisfactory condition to undergo the procedure.  After obtaining informed consent, the endoscope was                            passed under direct vision. Throughout the                            procedure, the patient's blood pressure, pulse, and                            oxygen saturations were monitored continuously. The                            Endoscope was introduced through the mouth, and                            advanced to the second part of duodenum.  The upper                            GI endoscopy was accomplished without difficulty.                            The patient tolerated the procedure well. Scope In: Scope Out: Findings:                 Esophagogastric landmarks were identified: the                            Z-line was found at 35 cm, the gastroesophageal                            junction was found at 35 cm and the upper extent of                            the gastric folds was found at 37 cm from the                            incisors.                           A 2 cm hiatal hernia was present.                           The exam of the esophagus was otherwise normal.                           A guidewire was placed and the scope was withdrawn.                            Empiric dilation was performed in the entire                            esophagus with a Savary dilator with no resistance  at 17 mm and 18 mm. Relook endoscopy showed a                            superficial wrent in the proximal esophagus around                            the UES.                           One superficial gastric ulcer with no stigmata of                            bleeding was found at the pylorus. The lesion was 6                            mm in largest dimension. Biopsies were taken from                            the periphery of the lesion with a cold forceps for                            histology.                           Diffuse moderately erythematous mucosa was found in                            the entire examined stomach. Biopsies were taken                            with a cold forceps for histology and Helicobacter                            pylori testing.                           The exam of the stomach was otherwise normal.                           The duodenal bulb and second portion of the                            duodenum were normal. Complications:            No immediate  complications. Estimated blood loss:                            Minimal. Estimated Blood Loss:     Estimated blood loss was minimal. Impression:               - Esophagogastric landmarks identified.                           - 2 cm hiatal hernia.                           -  Normal esophagus without obvious stenosis -                            empiric dilation to 18mm performed with mucosal                            wrent at the UES which is the likely cause of                            dysphagia                           - Gastric ulcer with no stigmata of bleeding.                            Biopsied.                           - Erythematous mucosa in the stomach. Biopsied.                           - Normal duodenal bulb and second portion of the                            duodenum. Recommendation:           - Patient has a contact number available for                            emergencies. The signs and symptoms of potential                            delayed complications were discussed with the                            patient. Return to normal activities tomorrow.                            Written discharge instructions were provided to the                            patient.                           - Resume previous diet.                           - Continue present medications.                           - Increase omeprazole to 40mg  twice daily (Epic                            lists nexium as PPI, but she states she takes  omeprazole)                           - Hold all NSAIDs other than aspirin                           - Await pathology results. Viviann Spare P. Nicole Biddy MD, MD 05/27/2017 10:59:07 AM This report has been signed electronically.

## 2017-05-27 NOTE — Progress Notes (Signed)
Called to room to assist during endoscopic procedure.  Patient ID and intended procedure confirmed with present staff. Received instructions for my participation in the procedure from the performing physician.  

## 2017-05-27 NOTE — Progress Notes (Signed)
Pt's states no medical or surgical changes since previsit or office visit. 

## 2017-05-28 ENCOUNTER — Telehealth: Payer: Self-pay

## 2017-05-28 ENCOUNTER — Telehealth: Payer: Self-pay | Admitting: Physician Assistant

## 2017-05-28 NOTE — Telephone Encounter (Signed)
I will route [facility] today [facility] the pt [facility] for further recommendations. Pa was waiting to hear from GI before making a change over to Crestor from Atorvastatin. Pt saw GI 05/26/17 and had EGD 05/27/17.

## 2017-05-28 NOTE — Telephone Encounter (Signed)
  Follow up Call-  Call back number 05/27/2017  Post procedure Call Back phone  # [phone]  Permission to leave phone message Yes  Some recent data might be hidden     Patient questions:  Do you have a fever, pain , or abdominal swelling? No. Pain Score  0 *  Have you tolerated food without any problems? Yes.    Have you been able to return to your normal activities? Yes.    Do you have any questions about your discharge instructions: Diet   No. Medications  No. Follow up visit  No.  Do you have questions or concerns about your Care? No.  Actions: * If pain score is 4 or above: No action needed, pain <4.

## 2017-05-28 NOTE — Telephone Encounter (Signed)
Patient calling, she would like to know if Tereso NewcomerScott Weaver could write a prescription for Crestor, 90 day supply. Prescription can be sent to [id number] - [place] - [address] AT [address].

## 2017-06-02 ENCOUNTER — Telehealth: Payer: Self-pay | Admitting: Nurse Practitioner

## 2017-06-02 ENCOUNTER — Other Ambulatory Visit: Payer: Medicare HMO | Admitting: *Deleted

## 2017-06-02 DIAGNOSIS — E782 Mixed hyperlipidemia: Secondary | ICD-10-CM

## 2017-06-02 DIAGNOSIS — E781 Pure hyperglyceridemia: Secondary | ICD-10-CM

## 2017-06-02 LAB — COMPREHENSIVE METABOLIC PANEL
A/G RATIO: 1.7 (ref 1.2–2.2)
ALBUMIN: 4.3 g/dL (ref 3.5–4.8)
ALT: 11 IU/L (ref 0–32)
AST: 20 IU/L (ref 0–40)
Alkaline Phosphatase: 100 IU/L (ref 39–117)
BUN / CREAT RATIO: 25 (ref 12–28)
BUN: 17 mg/dL (ref 8–27)
Bilirubin Total: 0.3 mg/dL (ref 0.0–1.2)
CALCIUM: 9.5 mg/dL (ref 8.7–10.3)
CO2: 22 mmol/L (ref 20–29)
CREATININE: 0.68 mg/dL (ref 0.57–1.00)
Chloride: 101 mmol/L (ref 96–106)
GFR calc Af Amer: 99 mL/min/{1.73_m2} (ref >59)
GFR, EST NON AFRICAN AMERICAN: 86 mL/min/{1.73_m2} (ref >59)
GLOBULIN, TOTAL: 2.5 g/dL (ref 1.5–4.5)
Glucose: 77 mg/dL (ref 65–99)
POTASSIUM: 5.2 mmol/L (ref 3.5–5.2)
SODIUM: 138 mmol/L (ref 134–144)
Total Protein: 6.8 g/dL (ref 6.0–8.5)

## 2017-06-02 LAB — LIPID PANEL
CHOLESTEROL TOTAL: 178 mg/dL (ref 100–199)
Chol/HDL Ratio: 4.8 ratio — ABNORMAL HIGH (ref 0.0–4.4)
HDL: 37 mg/dL — ABNORMAL LOW (ref >39)
LDL CALC: 86 mg/dL (ref 0–99)
TRIGLYCERIDES: 276 mg/dL — AB (ref 0–149)
VLDL Cholesterol Cal: 55 mg/dL — ABNORMAL HIGH (ref 5–40)

## 2017-06-02 MED ORDER — ROSUVASTATIN CALCIUM 20 MG PO TABS: 20.0000 mg | ORAL_TABLET | Freq: Every day | ORAL | 3 refills | Status: AC

## 2017-06-02 NOTE — Telephone Encounter (Signed)
-----   Message from Vesta MixerPhilip J Nahser, MD sent at 06/02/2017  4:39 PM EDT ----- Awilda Metrorigs are elevated.     Add fenofibrate 145 mg a day  Check labs in 3 months

## 2017-06-02 NOTE — Telephone Encounter (Signed)
Reviewed results of patient's recent fasting labs. She saw Tereso NewcomerScott Weaver, PA on 6/19. She reported that she was unable to tolerate fenofibrate. He advised her to remain off of fenofibrate and that a decision would be made after these lab results to determine therapy. She states she received the ok from her GI doctor to continue statin therapy. Per Scott's note, I advised her to stop atorvastatin 40 mg daily and start rosuvastatin 20 mg daily. I scheduled her for repeat lab work on 10/9 to evaluated effectiveness. She verbalized understanding and agreement with plan and was very grateful for the call.

## 2017-06-04 ENCOUNTER — Encounter: Payer: Self-pay | Admitting: Gastroenterology

## 2017-06-04 NOTE — Telephone Encounter (Signed)
Agree Tereso NewcomerScott Weaver, PA-C    06/04/2017 8:13 AM

## 2017-06-04 NOTE — Telephone Encounter (Signed)
Agree with changing to Rosuvastatin instead of atorvastatin  Repeat labs Oct. 9 .

## 2017-06-05 NOTE — Telephone Encounter (Signed)
This was filled on 06/02/17 to [facility](Michelle Swinyer and Dr. Elease HashimotoNahser - see chart). Tereso NewcomerScott Clotine Heiner, PA-C    06/05/2017 1:15 PM

## 2017-07-23 ENCOUNTER — Encounter: Payer: Self-pay | Admitting: Gastroenterology

## 2017-08-04 ENCOUNTER — Other Ambulatory Visit: Payer: Self-pay | Admitting: Physician Assistant

## 2017-08-06 ENCOUNTER — Encounter: Payer: Self-pay | Admitting: Gastroenterology

## 2017-09-01 ENCOUNTER — Ambulatory Visit (AMBULATORY_SURGERY_CENTER): Payer: Self-pay | Admitting: *Deleted

## 2017-09-01 VITALS — Ht 62.0 in | Wt 127.6 lb

## 2017-09-01 DIAGNOSIS — K253 Acute gastric ulcer without hemorrhage or perforation: Secondary | ICD-10-CM

## 2017-09-01 NOTE — Progress Notes (Signed)
No egg or soy allergy known to patient  No issues with past sedation with any surgeries  or procedures, no intubation problems  No diet pills per patient No home 02 use per patient  No blood thinners per patient  Pt denies issues with constipation  No A fib or A flutter  EMMI video sent to pt's e mail  - pt has no e mail   

## 2017-09-08 ENCOUNTER — Other Ambulatory Visit: Payer: Medicare HMO

## 2017-09-08 DIAGNOSIS — E781 Pure hyperglyceridemia: Secondary | ICD-10-CM

## 2017-09-08 DIAGNOSIS — E782 Mixed hyperlipidemia: Secondary | ICD-10-CM

## 2017-09-08 LAB — COMPREHENSIVE METABOLIC PANEL
A/G RATIO: 1.8 (ref 1.2–2.2)
ALBUMIN: 4.4 g/dL (ref 3.5–4.8)
ALT: 17 IU/L (ref 0–32)
AST: 28 IU/L (ref 0–40)
Alkaline Phosphatase: 108 IU/L (ref 39–117)
BUN / CREAT RATIO: 22 (ref 12–28)
BUN: 15 mg/dL (ref 8–27)
Bilirubin Total: 0.2 mg/dL (ref 0.0–1.2)
CALCIUM: 9.1 mg/dL (ref 8.7–10.3)
CO2: 24 mmol/L (ref 20–29)
Chloride: 103 mmol/L (ref 96–106)
Creatinine, Ser: 0.69 mg/dL (ref 0.57–1.00)
GFR, EST AFRICAN AMERICAN: 98 mL/min/{1.73_m2} (ref >59)
GFR, EST NON AFRICAN AMERICAN: 85 mL/min/{1.73_m2} (ref >59)
GLOBULIN, TOTAL: 2.5 g/dL (ref 1.5–4.5)
Glucose: 96 mg/dL (ref 65–99)
POTASSIUM: 4.6 mmol/L (ref 3.5–5.2)
SODIUM: 141 mmol/L (ref 134–144)
Total Protein: 6.9 g/dL (ref 6.0–8.5)

## 2017-09-08 LAB — LIPID PANEL
CHOLESTEROL TOTAL: 154 mg/dL (ref 100–199)
Chol/HDL Ratio: 3.9 ratio (ref 0.0–4.4)
HDL: 40 mg/dL (ref >39)
LDL CALC: 79 mg/dL (ref 0–99)
TRIGLYCERIDES: 175 mg/dL — AB (ref 0–149)
VLDL Cholesterol Cal: 35 mg/dL (ref 5–40)

## 2017-09-15 ENCOUNTER — Encounter: Payer: Self-pay | Admitting: Gastroenterology

## 2017-09-15 ENCOUNTER — Ambulatory Visit (AMBULATORY_SURGERY_CENTER): Payer: Medicare HMO | Admitting: Gastroenterology

## 2017-09-15 VITALS — BP 124/57 | HR 63 | Temp 96.9°F | Resp 14 | Ht 62.0 in | Wt 127.0 lb

## 2017-09-15 DIAGNOSIS — K3189 Other diseases of stomach and duodenum: Secondary | ICD-10-CM

## 2017-09-15 DIAGNOSIS — K253 Acute gastric ulcer without hemorrhage or perforation: Secondary | ICD-10-CM | POA: Diagnosis not present

## 2017-09-15 MED ORDER — SODIUM CHLORIDE 0.9 % IV SOLN: 500.0000 mL | INTRAVENOUS | Status: DC

## 2017-09-15 NOTE — Patient Instructions (Signed)
YOU HAD AN ENDOSCOPIC PROCEDURE TODAY AT THE [facility]:   Refer to the procedure report that was given to you for any specific questions about what was found during the examination.  If the procedure report does not answer your questions, please call your gastroenterologist to clarify.  If you requested that your care partner not be given the details of your procedure findings, then the procedure report has been included in a sealed envelope for you to review at your convenience later.  YOU SHOULD EXPECT: Some feelings of bloating in the abdomen. Passage of more gas than usual.  Walking can help get rid of the air that was put into your GI tract during the procedure and reduce the bloating. If you had a lower endoscopy (such as a colonoscopy or flexible sigmoidoscopy) you may notice spotting of blood in your stool or on the toilet paper. If you underwent a bowel prep for your procedure, you may not have a normal bowel movement for a few days.  Please Note:  You might notice some irritation and congestion in your nose or some drainage.  This is from the oxygen used during your procedure.  There is no need for concern and it should clear up in a day or so.  SYMPTOMS TO REPORT IMMEDIATELY:     Following upper endoscopy (EGD)  Vomiting of blood or coffee ground material  New chest pain or pain under the shoulder blades  Painful or persistently difficult swallowing  New shortness of breath  Fever of 100F or higher  Black, tarry-looking stools  For urgent or emergent issues, a gastroenterologist can be reached at any hour by calling [phone].   DIET:  We do recommend a small meal at first, but then you may proceed to your regular diet.  Drink plenty of fluids but you should avoid alcoholic beverages for 24 hours.  ACTIVITY:  You should plan to take it easy for the rest of today and you should NOT DRIVE or use heavy machinery until tomorrow (because of the sedation medicines  used during the test).    FOLLOW UP: Our staff will call the number listed on your records the next business day following your procedure to check on you and address any questions or concerns that you may have regarding the information given to you following your procedure. If we do not reach you, we will leave a message.  However, if you are feeling well and you are not experiencing any problems, there is no need to return our call.  We will assume that you have returned to your regular daily activities without incident.  If any biopsies were taken you will be contacted by phone or by letter within the next 1-3 weeks.  Please call us at [phone] if you have not heard about the biopsies in 3 weeks.    SIGNATURES/CONFIDENTIALITY: You and/or your care partner have signed paperwork which will be entered into your electronic medical record.  These signatures attest to the fact that that the information above on your After Visit Summary has been reviewed and is understood.  Full responsibility of the confidentiality of this discharge information lies with you and/or your care-partner.   Resume medications. Information given on hiatal hernia. 

## 2017-09-15 NOTE — Op Note (Signed)
[facility] Patient Name: Nicole Dean Procedure Date: 09/15/2017 9:51 AM MRN: [id number] Endoscopist: Viviann Spare P. Margarett Viti MD, MD Age: 76 Referring MD:  Date of Birth: 1941-08-30 Gender: Female Account #: [id number] Procedure:                Upper GI endoscopy Indications:              Follow-up of previously noted pyloruc ulcer,                            symptoms improved on twice daily omeprazole Medicines:                Monitored Anesthesia Care Procedure:                Pre-Anesthesia Assessment:                           - Prior to the procedure, a History and Physical                            was performed, and patient medications and                            allergies were reviewed. The patient's tolerance of                            previous anesthesia was also reviewed. The risks                            and benefits of the procedure and the sedation                            options and risks were discussed with the patient.                            All questions were answered, and informed consent                            was obtained. Prior Anticoagulants: The patient has                            taken no previous anticoagulant or antiplatelet                            agents. ASA Grade Assessment: III - A patient with                            severe systemic disease. After reviewing the risks                            and benefits, the patient was deemed in                            satisfactory condition to undergo the procedure.  After obtaining informed consent, the endoscope was                            passed under direct vision. Throughout the                            procedure, the patient's blood pressure, pulse, and                            oxygen saturations were monitored continuously. The                            Endoscope was introduced through the mouth, and   advanced to the second part of duodenum. The upper                            GI endoscopy was accomplished without difficulty.                            The patient tolerated the procedure well. Scope In: Scope Out: Findings:                 Esophagogastric landmarks were identified: the                            Z-line was found at 34 cm, the gastroesophageal                            junction was found at 34 cm and the upper extent of                            the gastric folds was found at 36 cm from the                            incisors.                           A 2 cm hiatal hernia was present.                           The exam of the esophagus was otherwise normal.                           Diffuse mildly erythematous mucosa was found in the                            entire examined stomach. Biopsies were taken with a                            cold forceps from the antrum, incisura, body for                            Helicobacter pylori testing.  The previously noted pyloric ulcer has healed,                            atlhough the area was a bit friable to contact. The                            exam of the stomach was otherwise normal.                           The duodenal bulb and second portion of the                            duodenum were normal. Complications:            No immediate complications. Estimated blood loss:                            Minimal. Estimated Blood Loss:     Estimated blood loss was minimal. Impression:               - Esophagogastric landmarks identified.                           - 2 cm hiatal hernia.                           - Normal esophagus                           - Erythematous mucosa in the stomach. Biopsied to                            ensure no evidence of H pylori.                           - Interval healing of prior ulceration                           - Normal duodenal bulb and second portion of  the                            duodenum. Recommendation:           - Patient has a contact number available for                            emergencies. The signs and symptoms of potential                            delayed complications were discussed with the                            patient. Return to normal activities tomorrow.                            Written discharge instructions were provided to the  patient.                           - Resume previous diet.                           - Continue present medications.                           - Await pathology results. Viviann Spare P. Gloris Shiroma MD, MD 09/15/2017 10:09:04 AM This report has been signed electronically.

## 2017-09-15 NOTE — Progress Notes (Signed)
Spontaneous respirations throughout. VSS. Resting comfortably. To PACU on room air. Report to  RN. 

## 2017-09-15 NOTE — Progress Notes (Signed)
Pt's states no medical or surgical changes since previsit or office visit. 

## 2017-09-15 NOTE — Progress Notes (Signed)
Called to room to assist during endoscopic procedure.  Patient ID and intended procedure confirmed with present staff. Received instructions for my participation in the procedure from the performing physician.  

## 2017-09-16 ENCOUNTER — Telehealth: Payer: Self-pay | Admitting: *Deleted

## 2017-09-16 NOTE — Telephone Encounter (Signed)
  Follow up Call-  Call back number 09/15/2017 05/27/2017  Post procedure Call Back phone  # [phone]  Permission to leave phone message Yes Yes  Some recent data might be hidden     Patient questions:  Do you have a fever, pain , or abdominal swelling? No. Pain Score  0 *  Have you tolerated food without any problems? Yes.    Have you been able to return to your normal activities? Yes.    Do you have any questions about your discharge instructions: Diet   No. Medications  No. Follow up visit  No.  Do you have questions or concerns about your Care? No.  Actions: * If pain score is 4 or above: No action needed, pain <4.

## 2017-09-17 ENCOUNTER — Encounter: Payer: Self-pay | Admitting: Gastroenterology

## 2017-10-28 ENCOUNTER — Other Ambulatory Visit: Payer: Self-pay | Admitting: Cardiovascular Disease

## 2018-03-31 ENCOUNTER — Ambulatory Visit (INDEPENDENT_AMBULATORY_CARE_PROVIDER_SITE_OTHER): Payer: Medicare HMO | Admitting: Cardiovascular Disease

## 2018-03-31 ENCOUNTER — Encounter (INDEPENDENT_AMBULATORY_CARE_PROVIDER_SITE_OTHER): Payer: Medicare HMO | Admitting: Ophthalmology

## 2018-03-31 ENCOUNTER — Encounter: Payer: Self-pay | Admitting: Cardiovascular Disease

## 2018-03-31 VITALS — BP 100/72 | HR 75 | Ht 62.0 in | Wt 139.0 lb

## 2018-03-31 DIAGNOSIS — I10 Essential (primary) hypertension: Secondary | ICD-10-CM | POA: Diagnosis not present

## 2018-03-31 DIAGNOSIS — Z9889 Other specified postprocedural states: Secondary | ICD-10-CM | POA: Diagnosis not present

## 2018-03-31 DIAGNOSIS — H43813 Vitreous degeneration, bilateral: Secondary | ICD-10-CM | POA: Diagnosis not present

## 2018-03-31 DIAGNOSIS — H353133 Nonexudative age-related macular degeneration, bilateral, advanced atrophic without subfoveal involvement: Secondary | ICD-10-CM | POA: Diagnosis not present

## 2018-03-31 DIAGNOSIS — I2510 Atherosclerotic heart disease of native coronary artery without angina pectoris: Secondary | ICD-10-CM

## 2018-03-31 DIAGNOSIS — H35033 Hypertensive retinopathy, bilateral: Secondary | ICD-10-CM

## 2018-03-31 LAB — BASIC METABOLIC PANEL
BUN / CREAT RATIO: 28 (ref 12–28)
BUN: 21 mg/dL (ref 8–27)
CO2: 23 mmol/L (ref 20–29)
CREATININE: 0.76 mg/dL (ref 0.57–1.00)
Calcium: 9.3 mg/dL (ref 8.7–10.3)
Chloride: 102 mmol/L (ref 96–106)
GFR calc Af Amer: 89 mL/min/{1.73_m2} (ref >59)
GFR, EST NON AFRICAN AMERICAN: 77 mL/min/{1.73_m2} (ref >59)
Glucose: 118 mg/dL — ABNORMAL HIGH (ref 65–99)
Potassium: 4.7 mmol/L (ref 3.5–5.2)
Sodium: 139 mmol/L (ref 134–144)

## 2018-03-31 LAB — HEPATIC FUNCTION PANEL
ALK PHOS: 109 IU/L (ref 39–117)
ALT: 24 IU/L (ref 0–32)
AST: 33 IU/L (ref 0–40)
Albumin: 4.5 g/dL (ref 3.5–4.8)
BILIRUBIN, DIRECT: 0.09 mg/dL (ref 0.00–0.40)
Bilirubin Total: <0.2 mg/dL (ref 0.0–1.2)
TOTAL PROTEIN: 6.8 g/dL (ref 6.0–8.5)

## 2018-03-31 LAB — LIPID PANEL
CHOLESTEROL TOTAL: 186 mg/dL (ref 100–199)
Chol/HDL Ratio: 4.4 ratio (ref 0.0–4.4)
HDL: 42 mg/dL (ref >39)
LDL Calculated: 95 mg/dL (ref 0–99)
TRIGLYCERIDES: 247 mg/dL — AB (ref 0–149)
VLDL Cholesterol Cal: 49 mg/dL — ABNORMAL HIGH (ref 5–40)

## 2018-03-31 NOTE — Progress Notes (Signed)
Cardiology Office Note   Date:  03/31/2018   ID:  Nicole Dean, DOB 25-Dec-1940, MRN [id number]  PCP:  Charlies Silvers, PA-C  Cardiologist:   Kristeen Miss, MD   Chief Complaint  Patient presents with  . Coronary Artery Disease    Problem List 1. CAD - s/p CABG  04/04/16   Left internal mammary graft to the LAD  Right internal mammary graft to the PDA  SVG to diagonal        SVG to OM 2.  AAA - s/p repair with Ao- bi fem graft. 07/22/16 Nicole Dean 2. Essential HTN 3. Hyperlipidemia    Previous notes:  Nicole Dean is a 77 y.o. female who presents for pre-op eval prior to AAA repair Was seen with daughter , Nicole Dean .  She has known about this AAA for a while,   Was offered repair but she declined - too much going on Recently found that the AAA had enlarged Does not have any abdominal pain  - has bilateral groin pain with walking   Has leg pain and weakness with climbing stairs.  No real CP or dyspnea   Former smoker - quit 17 year ago .   June 04, 2016: She has had CABG by Dr. Laneta Simmers on May 5. She appears to be doing quite well. No CP , breathing is good.  She saw Carlean Jews. PA  on May 30. Her HCTZ was stopped at that time but Nicole Dean developed significant leg edema. The HCTZ has been restarted and she seems to be doing quite well.  Still has not had herr AAA repaired yet  Dec. 8, 2017:  Nicole Dean is seen with son, Nicole Dean .  CABG in May Had AAA repair in Aug Gall bladder surgery Nov. 15, 2017 Derrell Lolling )  Had  a wound vac .   Still draining.     February 23, 2017:  Overall doing well .   BP is well controlled. Still healing from her GB surgery  No CP ,  Mar 31, 2018:  Nicole Dean is seen for follow-up of her coronary artery disease, abdominal aortic aneurysm repair, hyperlipidemia, hypertension. Doing well. Getting some exercise  Still has some tenderness in her gall bladder surgery scar .  Had to heal secondarily   Past Medical History:  Diagnosis Date  .  AAA (abdominal aortic aneurysm) (HCC)    VVS- following   . Allergy   . Anemia   . Anxiety   . Arthritis    hands  . Blood transfusion without reported diagnosis   . Cataract    removed bilateral   . Cholecystitis 10/2016  . Coronary artery disease   . Depression   . Gastric ulcer 05/27/2017   Dr Adela Lank  . GERD (gastroesophageal reflux disease)   . History of bronchitis   . History of pneumonia   . Hyperlipidemia   . Hypertension   . Numbness and tingling of right leg   . Shortness of breath dyspnea    sometimes even with resting; pt. denies since heart surgery  . Wears glasses     Past Surgical History:  Procedure Laterality Date  . ABDOMINAL AORTIC ANEURYSM REPAIR N/A 07/22/2016   Procedure: JUXTARENAL  ABDOMINAL AORTIC ANEURYSM REPAIR;  Surgeon: Chuck Hint, MD;  Location: [facility] OR;  Service: Vascular;  Laterality: N/A;  . AORTA - BILATERAL FEMORAL ARTERY BYPASS GRAFT N/A 07/22/2016   Procedure: AORTA BIFEMORAL BYPASS GRAFT;  Surgeon: Chuck Hint, MD;  Location: [facility] OR;  Service: Vascular;  Laterality: N/A;  . CABG x 4    . CARDIAC CATHETERIZATION N/A 03/31/2016   Procedure: Left Heart Cath and Coronary Angiography;  Surgeon: Peter M Swaziland, MD;  Location: [facility] INVASIVE CV LAB;  Service: Cardiovascular;  Laterality: N/A;  . CHOLECYSTECTOMY N/A 10/15/2016   Procedure: LAPAROSCOPIC CONVERTED TO OPEN CHOLECYSTECTOMY;  Surgeon: Claud Kelp, MD;  Location: [facility] OR;  Service: General;  Laterality: N/A;  . CHOLECYSTECTOMY, LAPAROSCOPIC  10/15/2016  . COLONOSCOPY    . CORONARY ARTERY BYPASS GRAFT N/A 04/04/2016   Procedure: CORONARY ARTERY BYPASS GRAFTING (CABG) x 4 using bilateral internal mammary arteries and right saphenous vein harvested by endovein;  Surgeon: Alleen Borne, MD;  Location: [facility] OR;  Service: Open Heart Surgery;  Laterality: N/A;  . EYE SURGERY Bilateral    cataracts removed./ IOL  . PERIPHERAL VASCULAR CATHETERIZATION N/A 03/03/2016   Procedure:  Abdominal Aortogram w/Lower Extremity;  Surgeon: Chuck Hint, MD;  Location: [facility] INVASIVE CV LAB;  Service: Cardiovascular;  Laterality: N/A;  . REPAIR ILIAC ARTERY Right 07/22/2016   Procedure: RIGHT COMMON ILIAC ANEURYSM REPAIR;  Surgeon: Chuck Hint, MD;  Location: [facility] OR;  Service: Vascular;  Laterality: Right;  . SHOULDER ARTHROSCOPY W/ ROTATOR CUFF REPAIR Right   . TEE WITHOUT CARDIOVERSION N/A 04/04/2016   Procedure: TRANSESOPHAGEAL ECHOCARDIOGRAM (TEE);  Surgeon: Alleen Borne, MD;  Location: [facility] OR;  Service: Open Heart Surgery;  Laterality: N/A;  . UPPER GASTROINTESTINAL ENDOSCOPY       Current Outpatient Medications  Medication Sig Dispense Refill  . acetaminophen (TYLENOL) 325 MG tablet Take 2 tablets (650 mg total) by mouth every 4 (four) hours as needed for mild pain, moderate pain or fever.    Marland Kitchen aspirin EC 81 MG tablet Take 1 tablet (81 mg total) by mouth daily.    . calcium carbonate (TUMS - DOSED IN MG ELEMENTAL CALCIUM) 500 MG chewable tablet Chew 5 tablets by mouth daily.    . carvedilol (COREG) 6.25 MG tablet TAKE 1 TABLET BY MOUTH TWICE A DAY 60 tablet 6  . enalapril (VASOTEC) 20 MG tablet Take 20 mg by mouth daily.    . hydrochlorothiazide (HYDRODIURIL) 25 MG tablet Take 25 mg by mouth daily.    Marland Kitchen LORazepam (ATIVAN) 0.5 MG tablet Take 1 tablet (0.5 mg total) by mouth at bedtime. 10 tablet 0  . Multiple Vitamins-Iron (MULTIVITAMIN/IRON PO) Take 1 tablet by mouth daily.    Marland Kitchen omeprazole (PRILOSEC) 40 MG capsule Take 1 capsule (40 mg total) by mouth 2 (two) times daily. 180 capsule 3  . senna (SENOKOT) 8.6 MG TABS tablet Take 1 tablet (8.6 mg total) by mouth 2 (two) times daily. 30 each 0  . sertraline (ZOLOFT) 100 MG tablet Take 100 mg by mouth daily with breakfast.     . traMADol (ULTRAM) 50 MG tablet Take 50 mg by mouth every 12 (twelve) hours as needed for pain.  5  . rosuvastatin (CRESTOR) 20 MG tablet Take 1 tablet (20 mg total) by mouth daily. 90 tablet  3   No current facility-administered medications for this visit.     Allergies:   Codeine and Penicillins    Social History:  The patient  reports that she quit smoking about 22 years ago. Her smoking use included cigarettes. She has a 52.50 pack-year smoking history. She has never used smokeless tobacco. She reports that she does not drink alcohol or use drugs.   Family History:  The patient's family history includes Cancer in her father; Colon polyps in her brother; Heart disease in her maternal grandfather and maternal uncle; Stomach cancer in her brother and father.    ROS:   Noted in current history, otherwise review of systems is negative.   Physical Exam: Blood pressure 100/72, pulse 75, height  (1.575 m), weight 139 lb (63 kg), SpO2 92 %.  GEN:  Well nourished, well developed in no acute distress HEENT: Normal NECK: No JVD; No carotid bruits LYMPHATICS: No lymphadenopathy CARDIAC: RR, no murmurs, rubs, gallops RESPIRATORY:  Clear to auscultation without rales, wheezing or rhonchi  ABDOMEN: Soft, non-tender, non-distended MUSCULOSKELETAL:  No edema; No deformity  SKIN: Warm and dry NEUROLOGIC:  Alert and oriented x 3   EKG: Mar 31, 2018: Normal sinus rhythm at a rate of 75.  Previous septal myocardial infarction.   Recent Labs: 09/08/2017: ALT 17; BUN 15; Creatinine, Ser 0.69; Potassium 4.6; Sodium 141    Lipid Panel    Component Value Date/Time   CHOL 154 09/08/2017 0844   TRIG 175 (H) 09/08/2017 0844   HDL 40 09/08/2017 0844   CHOLHDL 3.9 09/08/2017 0844   CHOLHDL 4.7 09/01/2012 1429   VLDL 60 (H) 09/01/2012 1429   LDLCALC 79 09/08/2017 0844   LDLDIRECT 82 03/23/2009 2152      Wt Readings from Last 3 Encounters:  03/31/18 139 lb (63 kg)  09/15/17 127 lb (57.6 kg)  09/01/17 127 lb 9.6 oz (57.9 kg)      Other studies Reviewed: Additional studies/ records that were reviewed today include: . Review of the above records demonstrates:     ASSESSMENT AND PLAN:  1.  AAA -   She's now status post abdominal aortic aneurysm repair.    She has seen Dr. Edilia Dean.    2. CAD :   She had an abnormal Myoview study and subsequent cardiac catheterization revealed significant coronary artery disease. She's now status post coronary artery bypass grafting  Left internal mammary graft to the LAD  Right internal mammary graft to the PDA  SVG to diagonal  SVG to OM    3. Essential hypertension:  Doing well BP is well controlled    Current medicines are reviewed at length with the patient today.  The patient does not have concerns regarding medicines.  The following changes have been made:  no change  Labs/ tests ordered today include:  No orders of the defined types were placed in this encounter.    Disposition:   FU with me in 1 year.     Kristeen Miss, MD  03/31/2018 10:15 AM    [facility] [address] [place], [place], [place]  [address] Phone: [phone]; Fax: [phone]

## 2018-03-31 NOTE — Patient Instructions (Signed)
Medication Instructions:  Your physician recommends that you continue on your current medications as directed. Please refer to the Current Medication list given to you today.   Labwork: TODAY - basic metabolic panel, liver panel, cholesterol   Testing/Procedures: None Ordered   Follow-Up: Your physician wants you to follow-up in: 1 year with Dr. Nahser. You will receive a reminder letter in the mail two months in advance. If you don't receive a letter, please call our office to schedule the follow-up appointment.   If you need a refill on your cardiac medications before your next appointment, please call your pharmacy.   Thank you for choosing [facility]! Michelle Swinyer, RN [phone]    

## 2019-04-01 ENCOUNTER — Encounter (INDEPENDENT_AMBULATORY_CARE_PROVIDER_SITE_OTHER): Payer: Medicare HMO | Admitting: Ophthalmology

## 2019-04-01 ENCOUNTER — Other Ambulatory Visit: Payer: Self-pay

## 2019-04-01 DIAGNOSIS — H353132 Nonexudative age-related macular degeneration, bilateral, intermediate dry stage: Secondary | ICD-10-CM

## 2019-04-01 DIAGNOSIS — H35033 Hypertensive retinopathy, bilateral: Secondary | ICD-10-CM | POA: Diagnosis not present

## 2019-04-01 DIAGNOSIS — H43813 Vitreous degeneration, bilateral: Secondary | ICD-10-CM | POA: Diagnosis not present

## 2019-04-01 DIAGNOSIS — I10 Essential (primary) hypertension: Secondary | ICD-10-CM

## 2019-04-19 ENCOUNTER — Telehealth: Payer: Self-pay | Admitting: Nurse Practitioner

## 2019-04-19 NOTE — Telephone Encounter (Signed)
Called patient to discuss virtual office visit for 5/26 with Dr. Elease Hashimoto. Patient denies cardiac complaints at this time and states she would like to postpone her visit to when she can be seen in person. I rescheduled patient for July 21 and advised her to call back prior to that date with questions or concerns. She thanked me for the call.

## 2019-04-26 ENCOUNTER — Ambulatory Visit: Payer: Medicare HMO | Admitting: Cardiovascular Disease

## 2019-06-20 ENCOUNTER — Telehealth: Payer: Self-pay | Admitting: Cardiovascular Disease

## 2019-06-20 NOTE — Telephone Encounter (Signed)

## 2019-06-21 ENCOUNTER — Ambulatory Visit (INDEPENDENT_AMBULATORY_CARE_PROVIDER_SITE_OTHER): Payer: Medicare HMO | Admitting: Cardiovascular Disease

## 2019-06-21 ENCOUNTER — Other Ambulatory Visit: Payer: Self-pay

## 2019-06-21 ENCOUNTER — Encounter: Payer: Self-pay | Admitting: Cardiovascular Disease

## 2019-06-21 VITALS — BP 130/52 | HR 64 | Ht 62.0 in | Wt 135.1 lb

## 2019-06-21 DIAGNOSIS — Z9889 Other specified postprocedural states: Secondary | ICD-10-CM | POA: Diagnosis not present

## 2019-06-21 DIAGNOSIS — I10 Essential (primary) hypertension: Secondary | ICD-10-CM

## 2019-06-21 DIAGNOSIS — I2510 Atherosclerotic heart disease of native coronary artery without angina pectoris: Secondary | ICD-10-CM | POA: Diagnosis not present

## 2019-06-21 DIAGNOSIS — E785 Hyperlipidemia, unspecified: Secondary | ICD-10-CM

## 2019-06-21 LAB — LIPID PANEL
Chol/HDL Ratio: 3.8 ratio (ref 0.0–4.4)
Cholesterol, Total: 155 mg/dL (ref 100–199)
HDL: 41 mg/dL (ref >39)
LDL Calculated: 69 mg/dL (ref 0–99)
Triglycerides: 227 mg/dL — ABNORMAL HIGH (ref 0–149)
VLDL Cholesterol Cal: 45 mg/dL — ABNORMAL HIGH (ref 5–40)

## 2019-06-21 LAB — HEPATIC FUNCTION PANEL
ALT: 17 IU/L (ref 0–32)
AST: 29 IU/L (ref 0–40)
Albumin: 4.5 g/dL (ref 3.7–4.7)
Alkaline Phosphatase: 98 IU/L (ref 39–117)
Bilirubin Total: 0.3 mg/dL (ref 0.0–1.2)
Bilirubin, Direct: 0.11 mg/dL (ref 0.00–0.40)
Total Protein: 6.7 g/dL (ref 6.0–8.5)

## 2019-06-21 LAB — BASIC METABOLIC PANEL
BUN/Creatinine Ratio: 27 (ref 12–28)
BUN: 28 mg/dL — ABNORMAL HIGH (ref 8–27)
CO2: 24 mmol/L (ref 20–29)
Calcium: 9.6 mg/dL (ref 8.7–10.3)
Chloride: 98 mmol/L (ref 96–106)
Creatinine, Ser: 1.04 mg/dL — ABNORMAL HIGH (ref 0.57–1.00)
GFR calc Af Amer: 60 mL/min/{1.73_m2} (ref >59)
GFR calc non Af Amer: 52 mL/min/{1.73_m2} — ABNORMAL LOW (ref >59)
Glucose: 98 mg/dL (ref 65–99)
Potassium: 5.5 mmol/L — ABNORMAL HIGH (ref 3.5–5.2)
Sodium: 137 mmol/L (ref 134–144)

## 2019-06-21 NOTE — Progress Notes (Signed)
Cardiology Office Note   Date:  06/21/2019   ID:  Nicole Dean, DOB 09/17/1941, MRN [id number]  PCP:  Charlies Silversouillard, Jennifer, PA-C  Cardiologist:   Kristeen MissPhilip Haven Pylant, MD   Chief Complaint  Patient presents with  . Coronary Artery Disease    Problem List 1. CAD - s/p CABG  04/04/16   Left internal mammary graft to the LAD  Right internal mammary graft to the PDA  SVG to diagonal        SVG to OM 2.  AAA - s/p repair with Ao- bi fem graft. 07/22/16 Nicole Dean 2. Essential HTN 3. Hyperlipidemia    Previous notes:  Nicole Dean is a 78 y.o. female who presents for pre-op eval prior to AAA repair Was seen with daughter , Nicole Dean .  She has known about this AAA for a while,   Was offered repair but she declined - too much going on Recently found that the AAA had enlarged Does not have any abdominal pain  - has bilateral groin pain with walking   Has leg pain and weakness with climbing stairs.  No real CP or dyspnea   Former smoker - quit 17 year ago .   June 04, 2016: She has had CABG by Dr. Laneta SimmersBartle on May 5. She appears to be doing quite well. No CP , breathing is good.  She saw [name]. PA  on May 30. Her HCTZ was stopped at that time but Nicole Dean developed significant leg edema. The HCTZ has been restarted and she seems to be doing quite well.  Still has not had herr AAA repaired yet  Dec. 8, 2017:  Nicole Dean is seen with son, Nicole Dean .  CABG in May Had AAA repair in Aug Gall bladder surgery Nov. 15, 2017 [name]  Had  a wound vac .   Still draining.     February 23, 2017:  Overall doing well .   BP is well controlled. Still healing from her GB surgery  No CP ,  Mar 31, 2018:  Nicole Dean is seen for follow-up of her coronary artery disease, abdominal aortic aneurysm repair, hyperlipidemia, hypertension. Doing well. Getting some exercise  Still has some tenderness in her gall bladder surgery scar .  Had to heal secondarily   June 21, 2019: Nicole Dean is seen today  follow-up for follow-up of her coronary artery disease, abdominal aortic aneurysm repair, hyperlipidemia, hypertension.  Seems to be getting along well now .  Wt today is 135 lbs.    Past Medical History:  Diagnosis Date  . AAA (abdominal aortic aneurysm) (HCC)    VVS- following   . Allergy   . Anemia   . Anxiety   . Arthritis    hands  . Blood transfusion without reported diagnosis   . Cataract    removed bilateral   . Cholecystitis 10/2016  . Coronary artery disease   . Depression   . Gastric ulcer 05/27/2017   Dr Adela LankArmbruster  . GERD (gastroesophageal reflux disease)   . History of bronchitis   . History of pneumonia   . Hyperlipidemia   . Hypertension   . Numbness and tingling of right leg   . Shortness of breath dyspnea    sometimes even with resting; pt. denies since heart surgery  . Wears glasses     Past Surgical History:  Procedure Laterality Date  . ABDOMINAL AORTIC ANEURYSM REPAIR N/A 07/22/2016   Procedure: JUXTARENAL  ABDOMINAL AORTIC ANEURYSM REPAIR;  Surgeon: Angelia Mould, MD;  Location: [facility];  Service: Vascular;  Laterality: N/A;  . AORTA - BILATERAL FEMORAL ARTERY BYPASS GRAFT N/A 07/22/2016   Procedure: AORTA BIFEMORAL BYPASS GRAFT;  Surgeon: Angelia Mould, MD;  Location: [place];  Service: Vascular;  Laterality: N/A;  . CABG x 4    . CARDIAC CATHETERIZATION N/A 03/31/2016   Procedure: Left Heart Cath and Coronary Angiography;  Surgeon: Peter M Martinique, MD;  Location: [facility] CV LAB;  Service: Cardiovascular;  Laterality: N/A;  . CHOLECYSTECTOMY N/A 10/15/2016   Procedure: LAPAROSCOPIC CONVERTED TO OPEN CHOLECYSTECTOMY;  Surgeon: Fanny Skates, MD;  Location: [place];  Service: General;  Laterality: N/A;  . CHOLECYSTECTOMY, LAPAROSCOPIC  10/15/2016  . COLONOSCOPY    . CORONARY ARTERY BYPASS GRAFT N/A 04/04/2016   Procedure: CORONARY ARTERY BYPASS GRAFTING (CABG) x 4 using bilateral internal mammary arteries and right saphenous vein harvested  by endovein;  Surgeon: Gaye Pollack, MD;  Location: [facility] OR;  Service: Open Heart Surgery;  Laterality: N/A;  . EYE SURGERY Bilateral    cataracts removed./ IOL  . PERIPHERAL VASCULAR CATHETERIZATION N/A 03/03/2016   Procedure: Abdominal Aortogram w/Lower Extremity;  Surgeon: Angelia Mould, MD;  Location: [place] CV LAB;  Service: Cardiovascular;  Laterality: N/A;  . REPAIR ILIAC ARTERY Right 07/22/2016   Procedure: RIGHT COMMON ILIAC ANEURYSM REPAIR;  Surgeon: Angelia Mould, MD;  Location: [place] [place];  Service: Vascular;  Laterality: Right;  . SHOULDER ARTHROSCOPY W/ ROTATOR CUFF REPAIR Right   . TEE WITHOUT CARDIOVERSION N/A 04/04/2016   Procedure: TRANSESOPHAGEAL ECHOCARDIOGRAM (TEE);  Surgeon: Gaye Pollack, MD;  Location: [place];  Service: Open Heart Surgery;  Laterality: N/A;  . UPPER GASTROINTESTINAL ENDOSCOPY       Current Outpatient Medications  Medication Sig Dispense Refill  . acetaminophen (TYLENOL) 325 MG tablet Take 2 tablets (650 mg total) by mouth every 4 (four) hours as needed for mild pain, moderate pain or fever.    . calcium carbonate (TUMS - DOSED IN MG ELEMENTAL CALCIUM) 500 MG chewable tablet Chew 5 tablets by mouth as needed.     . carvedilol (COREG) 6.25 MG tablet TAKE 1 TABLET BY MOUTH TWICE A DAY 60 tablet 6  . enalapril (VASOTEC) 20 MG tablet Take 20 mg by mouth daily.    . hydrochlorothiazide (HYDRODIURIL) 25 MG tablet Take 25 mg by mouth daily.    Marland Kitchen LORazepam (ATIVAN) 0.5 MG tablet Take 1 tablet (0.5 mg total) by mouth at bedtime. 10 tablet 0  . Multiple Vitamins-Iron (MULTIVITAMIN/IRON PO) Take 1 tablet by mouth daily.    Marland Kitchen omeprazole (PRILOSEC) 40 MG capsule Take 40 mg by mouth daily.    . rosuvastatin (CRESTOR) 20 MG tablet Take 1 tablet (20 mg total) by mouth daily. 90 tablet 3  . senna (SENOKOT) 8.6 MG TABS tablet Take 1 tablet (8.6 mg total) by mouth 2 (two) times daily. 30 each 0  . sertraline (ZOLOFT) 100 MG tablet Take 100 mg by mouth daily  with breakfast.     . traMADol (ULTRAM) 50 MG tablet Take 50 mg by mouth every 12 (twelve) hours as needed for pain.  5   No current facility-administered medications for this visit.     Allergies:   Codeine and Penicillins    Social History:  The patient  reports that she quit smoking about 23 years ago. Her smoking use included cigarettes. She has a 52.50 pack-year smoking history. She has never used smokeless  tobacco. She reports that she does not drink alcohol or use drugs.   Family History:  The patient's family history includes Cancer in her father; Colon polyps in her brother; Heart disease in her maternal grandfather and maternal uncle; Stomach cancer in her brother and father.    ROS:   Noted in current history, otherwise review of systems is negative.   Physical Exam: Blood pressure (!) 130/52, pulse 64, height 5\' 2"  (1.575 m), weight 135 lb 1.9 oz (61.3 kg), SpO2 96 %.  GEN:   Elderly female,  NAD  HEENT: Normal NECK: No JVD; No carotid bruits LYMPHATICS: No lymphadenopathy CARDIAC: RRR , no murmurs, rubs, gallops RESPIRATORY:  Clear to auscultation without rales, wheezing or rhonchi  ABDOMEN: Soft, non-tender, non-distended MUSCULOSKELETAL:  No edema; No deformity  SKIN: Warm and dry NEUROLOGIC:  Alert and oriented x 3    EKG:     Recent Labs: No results found for requested labs within last 8760 hours.    Lipid Panel    Component Value Date/Time   CHOL 186 03/31/2018 1030   TRIG 247 (H) 03/31/2018 1030   HDL 42 03/31/2018 1030   CHOLHDL 4.4 03/31/2018 1030   CHOLHDL 4.7 09/01/2012 1429   VLDL 60 (H) 09/01/2012 1429   LDLCALC 95 03/31/2018 1030   LDLDIRECT 82 03/23/2009 2152      Wt Readings from Last 3 Encounters:  06/21/19 135 lb 1.9 oz (61.3 kg)  03/31/18 139 lb (63 kg)  09/15/17 127 lb (57.6 kg)      Other studies Reviewed: Additional studies/ records that were reviewed today include: . Review of the above records demonstrates:     ASSESSMENT AND PLAN:  1.  AAA -   She's now status post abdominal aortic aneurysm repair.    She has seen Dr. Edilia Boickson.  BP is well controlled.  No symptoms     2. CAD :   She had an abnormal Myoview study and subsequent cardiac catheterization revealed significant coronary artery disease. She's now status post coronary artery bypass grafting  Left internal mammary graft to the LAD  Right internal mammary graft to the PDA  SVG to diagonal  SVG to OM  no angina.   Chol levels are ok.   3. Essential hypertension:   BP is well controlled.   4.  Hyperlipidemia :  Check labs today advised her to avoid carbs to hopefully reduce her trig level    Current medicines are reviewed at length with the patient today.  The patient does not have concerns regarding medicines.  The following changes have been made:  no change  Labs/ tests ordered today include:   Orders Placed This Encounter  Procedures  . Lipid Profile  . Basic Metabolic Panel (BMET)  . Hepatic function panel     Disposition:        Kristeen MissPhilip Nickey Canedo, MD  06/21/2019 9:52 AM    [facility] [address], [place], [place]  [id number] Phone: [phone]; Fax: [phone]

## 2019-06-21 NOTE — Patient Instructions (Addendum)
Medication Instructions:  Your physician recommends that you continue on your current medications as directed. Please refer to the Current Medication list given to you today.  If you need a refill on your cardiac medications before your next appointment, please call your pharmacy.   Lab work: TODAY - cholesterol, liver panel, basic metabolic panel  If you have labs (blood work) drawn today and your tests are completely normal, you will receive your results only by: . MyChart Message (if you have MyChart) OR . A paper copy in the mail If you have any lab test that is abnormal or we need to change your treatment, we will call you to review the results.  Testing/Procedures: None Ordered   Follow-Up: At [facility], you and your health needs are our priority.  As part of our continuing mission to provide you with exceptional heart care, we have created designated Provider Care Teams.  These Care Teams include your primary Cardiologist (physician) and Advanced Practice Providers (APPs -  Physician Assistants and Nurse Practitioners) who all work together to provide you with the care you need, when you need it. You will need a follow up appointment in:  1 years.  Please call our office 2 months in advance to schedule this appointment.  You may see Philip Nahser, MD or one of the following Advanced Practice Providers on your designated Care Team: Scott Weaver, PA-C Vin Bhagat, PA-C . Janine Hammond, NP     

## 2019-06-22 ENCOUNTER — Telehealth: Payer: Self-pay | Admitting: *Deleted

## 2019-06-22 DIAGNOSIS — I10 Essential (primary) hypertension: Secondary | ICD-10-CM

## 2019-06-22 NOTE — Telephone Encounter (Signed)
-----   Message from Thayer Headings, MD sent at 06/22/2019 12:19 PM EDT ----- Creatinine and potassium are higher.   Please have her increase her intake of water Recheck bmp in 1 month

## 2019-06-22 NOTE — Telephone Encounter (Signed)
Pt has been notified of lab results and recommendations. Pt agreeable to increase water intake and repeat bmp on 07/26/19. Pt thanked me for the call.  Patient notified of result.  Please refer to phone note from today for complete details.   Julaine Hua, CMA 06/22/2019 4:10 PM

## 2019-07-26 ENCOUNTER — Other Ambulatory Visit: Payer: Medicare HMO

## 2019-07-26 ENCOUNTER — Other Ambulatory Visit: Payer: Self-pay

## 2019-07-26 DIAGNOSIS — I10 Essential (primary) hypertension: Secondary | ICD-10-CM

## 2019-07-26 LAB — BASIC METABOLIC PANEL
BUN/Creatinine Ratio: 36 — ABNORMAL HIGH (ref 12–28)
BUN: 29 mg/dL — ABNORMAL HIGH (ref 8–27)
CO2: 21 mmol/L (ref 20–29)
Calcium: 9.4 mg/dL (ref 8.7–10.3)
Chloride: 101 mmol/L (ref 96–106)
Creatinine, Ser: 0.8 mg/dL (ref 0.57–1.00)
GFR calc Af Amer: 82 mL/min/{1.73_m2} (ref >59)
GFR calc non Af Amer: 71 mL/min/{1.73_m2} (ref >59)
Glucose: 100 mg/dL — ABNORMAL HIGH (ref 65–99)
Potassium: 4.8 mmol/L (ref 3.5–5.2)
Sodium: 138 mmol/L (ref 134–144)

## 2019-12-21 ENCOUNTER — Other Ambulatory Visit: Payer: Self-pay

## 2019-12-21 ENCOUNTER — Ambulatory Visit (INDEPENDENT_AMBULATORY_CARE_PROVIDER_SITE_OTHER): Payer: Medicare HMO | Admitting: Podiatry

## 2019-12-21 ENCOUNTER — Encounter: Payer: Self-pay | Admitting: Podiatry

## 2019-12-21 VITALS — BP 94/84

## 2019-12-21 DIAGNOSIS — M79674 Pain in right toe(s): Secondary | ICD-10-CM | POA: Diagnosis not present

## 2019-12-21 DIAGNOSIS — L600 Ingrowing nail: Secondary | ICD-10-CM | POA: Diagnosis not present

## 2019-12-22 ENCOUNTER — Encounter: Payer: Self-pay | Admitting: Podiatry

## 2019-12-22 NOTE — Progress Notes (Signed)
Subjective:  Patient ID: Nicole Dean, female    DOB: 27-Oct-1941,  MRN: [id number]  Chief Complaint  Patient presents with  . Nail Problem    pt is here for ingrown toenail of the right great toenail lateral side, pain has been going on for about 3 weeks, pt has also tried debridement of the toenail, but states she is still in pain    79 y.o. female presents with the above complaint.  Patient presents with the ingrown to the right toenail lateral border.  Patient states been going on for a while.  She states been causing her a lot of pain and is very painful to touch.  Patient has tried debridement but has not helped much at all.  She says there is some swelling associated with it.  She would like to know if there is any treatment options associated with it.  She denies any other acute complaints.   Review of Systems: Negative except as noted in the HPI. Denies N/V/F/Ch.  Past Medical History:  Diagnosis Date  . AAA (abdominal aortic aneurysm) (HCC)    VVS- following   . Allergy   . Anemia   . Anxiety   . Arthritis    hands  . Blood transfusion without reported diagnosis   . Cataract    removed bilateral   . Cholecystitis 10/2016  . Coronary artery disease   . Depression   . Gastric ulcer 05/27/2017   Dr Adela Lank  . GERD (gastroesophageal reflux disease)   . History of bronchitis   . History of pneumonia   . Hyperlipidemia   . Hypertension   . Numbness and tingling of right leg   . Shortness of breath dyspnea    sometimes even with resting; pt. denies since heart surgery  . Wears glasses     Current Outpatient Medications:  .  acetaminophen (TYLENOL) 325 MG tablet, Take 2 tablets (650 mg total) by mouth every 4 (four) hours as needed for mild pain, moderate pain or fever., Disp: , Rfl:  .  albuterol (VENTOLIN HFA) 108 (90 Base) MCG/ACT inhaler, INHALE 2 PUFFS INTO THE LUNGS EVERY 6 (SIX) HOURS AS NEEDED FOR SHORTNESS OF BREATH., Disp: , Rfl:  .  calcium carbonate  (TUMS - DOSED IN MG ELEMENTAL CALCIUM) 500 MG chewable tablet, Chew 5 tablets by mouth as needed. , Disp: , Rfl:  .  carvedilol (COREG) 6.25 MG tablet, TAKE 1 TABLET BY MOUTH TWICE A DAY, Disp: 60 tablet, Rfl: 6 .  enalapril (VASOTEC) 20 MG tablet, Take 20 mg by mouth daily., Disp: , Rfl:  .  hydrochlorothiazide (HYDRODIURIL) 25 MG tablet, Take 25 mg by mouth daily., Disp: , Rfl:  .  LORazepam (ATIVAN) 0.5 MG tablet, Take 1 tablet (0.5 mg total) by mouth at bedtime., Disp: 10 tablet, Rfl: 0 .  Multiple Vitamins-Iron (MULTIVITAMIN/IRON PO), Take 1 tablet by mouth daily., Disp: , Rfl:  .  omeprazole (PRILOSEC) 40 MG capsule, Take 40 mg by mouth daily., Disp: , Rfl:  .  Pediatric Multivitamins-Iron (ANIMAL SHAPES/IRON) 18 MG CHEW, Chew by mouth., Disp: , Rfl:  .  rosuvastatin (CRESTOR) 20 MG tablet, Take 1 tablet (20 mg total) by mouth daily., Disp: 90 tablet, Rfl: 3 .  senna (SENOKOT) 8.6 MG TABS tablet, Take 1 tablet (8.6 mg total) by mouth 2 (two) times daily., Disp: 30 each, Rfl: 0 .  sertraline (ZOLOFT) 100 MG tablet, Take 100 mg by mouth daily with breakfast. , Disp: , Rfl:  .  traMADol (ULTRAM) 50 MG tablet, Take 50 mg by mouth every 12 (twelve) hours as needed for pain., Disp: , Rfl: 5 .  traZODone (DESYREL) 100 MG tablet, , Disp: , Rfl:   Social History   Tobacco Use  Smoking Status Former Smoker  . Packs/day: 1.50  . Years: 35.00  . Pack years: 52.50  . Types: Cigarettes  . Quit date: 02/15/1996  . Years since quitting: 23.8  Smokeless Tobacco Never Used    Allergies  Allergen Reactions  . Codeine Nausea And Vomiting    Severe, ended up in ED  . Penicillins Nausea Only and Other (See Comments)    Has patient had a PCN reaction causing immediate rash, facial/tongue/throat swelling, SOB or lightheadedness with hypotension: Yes Has patient had a PCN reaction causing severe rash involving mucus membranes or skin necrosis: No Has patient had a PCN reaction that required  hospitalization No Has patient had a PCN reaction occurring within the last 10 years: No If all of the above answers are "NO", then may proceed with Cephalosporin use.    Objective:   Vitals:   12/21/19 0924  BP: 94/84   There is no height or weight on file to calculate BMI. Constitutional Well developed. Well nourished.  Vascular Dorsalis pedis pulses palpable bilaterally. Posterior tibial pulses palpable bilaterally. Capillary refill normal to all digits.  No cyanosis or clubbing noted. Pedal hair growth normal.  Neurologic Normal speech. Oriented to person, place, and time. Epicritic sensation to light touch grossly present bilaterally.  Dermatologic Painful ingrowing nail at lateral nail borders of the hallux nail right. No other open wounds. No skin lesions.  Orthopedic: Normal joint ROM without pain or crepitus bilaterally. No visible deformities. No bony tenderness.   Radiographs: None Assessment:   1. Ingrown toenail of right foot   2. Great toe pain, right    Plan:  Patient was evaluated and treated and all questions answered.  Ingrown Nail, right -Patient elects to proceed with minor surgery to remove ingrown toenail removal today. Consent reviewed and signed by patient. -Ingrown nail excised. See procedure note. -Educated on post-procedure care including soaking. Written instructions provided and reviewed. -Patient to follow up in 2 weeks for nail check.  Procedure: Excision of Ingrown Toenail Location: Right 1st toe lateral nail borders. Anesthesia: Lidocaine 1% plain; 1.5 mL and Marcaine 0.5% plain; 1.5 mL, digital block. Skin Prep: Betadine. Dressing: Silvadene; telfa; dry, sterile, compression dressing. Technique: Following skin prep, the toe was exsanguinated and a tourniquet was secured at the base of the toe. The affected nail border was freed, split with a nail splitter, and excised. Chemical matrixectomy was then performed with phenol and irrigated  out with alcohol. The tourniquet was then removed and sterile dressing applied. Disposition: Patient tolerated procedure well. Patient to return in 2 weeks for follow-up.   Return in about 2 weeks (around 01/04/2020) for Nail Check.

## 2020-01-26 ENCOUNTER — Ambulatory Visit: Payer: Medicare HMO | Attending: Internal Medicine

## 2020-01-26 DIAGNOSIS — Z23 Encounter for immunization: Secondary | ICD-10-CM

## 2020-01-26 NOTE — Progress Notes (Signed)
   Covid-19 Vaccination Clinic  Name:  Nicole Dean    MRN: [id number] DOB: 09/25/1941  01/26/2020  [name] was observed post Covid-19 immunization for 15 minutes without incidence. She was provided with Vaccine Information Sheet and instruction to access the V-Safe system.   [name] was instructed to call 911 with any severe reactions post vaccine: Marland Kitchen Difficulty breathing  . Swelling of your face and throat  . A fast heartbeat  . A bad rash all over your body  . Dizziness and weakness    Immunizations Administered    Name Date Dose VIS Date Route   Pfizer COVID-19 Vaccine 01/26/2020 12:35 PM 0.3 mL 11/11/2019 Intramuscular   Manufacturer: [employer], [employer]   Lot: [id number]   NDC: [address]-9

## 2020-02-21 ENCOUNTER — Ambulatory Visit: Payer: Medicare HMO | Attending: Internal Medicine

## 2020-02-21 DIAGNOSIS — Z23 Encounter for immunization: Secondary | ICD-10-CM

## 2020-02-21 NOTE — Progress Notes (Signed)
   Covid-19 Vaccination Clinic  Name:  Nicole Dean    MRN: [id number] DOB: [birth year]  02/21/2020  Nicole Dean was observed post Covid-19 immunization for 15 minutes without incident. She was provided with Vaccine Information Sheet and instruction to access the V-Safe system.   Nicole Dean was instructed to call 911 with any severe reactions post vaccine: Marland Kitchen Difficulty breathing  . Swelling of face and throat  . A fast heartbeat  . A bad rash all over body  . Dizziness and weakness   Immunizations Administered    Name Date Dose VIS Date Route   Pfizer COVID-19 Vaccine 02/21/2020 12:15 PM 0.3 mL 11/11/2019 Intramuscular   Manufacturer: [employer], [employer]   Lot: [id number]   NDC: [address]-6

## 2020-04-02 ENCOUNTER — Encounter (INDEPENDENT_AMBULATORY_CARE_PROVIDER_SITE_OTHER): Payer: Medicare HMO | Admitting: Ophthalmology

## 2020-04-02 DIAGNOSIS — I10 Essential (primary) hypertension: Secondary | ICD-10-CM | POA: Diagnosis not present

## 2020-04-02 DIAGNOSIS — H35033 Hypertensive retinopathy, bilateral: Secondary | ICD-10-CM | POA: Diagnosis not present

## 2020-04-02 DIAGNOSIS — H43813 Vitreous degeneration, bilateral: Secondary | ICD-10-CM | POA: Diagnosis not present

## 2020-04-02 DIAGNOSIS — H353133 Nonexudative age-related macular degeneration, bilateral, advanced atrophic without subfoveal involvement: Secondary | ICD-10-CM

## 2020-07-04 ENCOUNTER — Encounter: Payer: Self-pay | Admitting: Cardiovascular Disease

## 2020-07-04 ENCOUNTER — Ambulatory Visit (INDEPENDENT_AMBULATORY_CARE_PROVIDER_SITE_OTHER): Payer: Medicare HMO | Admitting: Cardiovascular Disease

## 2020-07-04 ENCOUNTER — Other Ambulatory Visit: Payer: Self-pay

## 2020-07-04 VITALS — BP 110/54 | HR 63 | Ht 62.0 in | Wt 126.8 lb

## 2020-07-04 DIAGNOSIS — I10 Essential (primary) hypertension: Secondary | ICD-10-CM | POA: Diagnosis not present

## 2020-07-04 DIAGNOSIS — I714 Abdominal aortic aneurysm, without rupture, unspecified: Secondary | ICD-10-CM

## 2020-07-04 DIAGNOSIS — I2510 Atherosclerotic heart disease of native coronary artery without angina pectoris: Secondary | ICD-10-CM | POA: Diagnosis not present

## 2020-07-04 DIAGNOSIS — E785 Hyperlipidemia, unspecified: Secondary | ICD-10-CM | POA: Diagnosis not present

## 2020-07-04 LAB — LIPID PANEL
Chol/HDL Ratio: 3.9 ratio (ref 0.0–4.4)
Cholesterol, Total: 160 mg/dL (ref 100–199)
HDL: 41 mg/dL (ref >39)
LDL Chol Calc (NIH): 86 mg/dL (ref 0–99)
Triglycerides: 193 mg/dL — ABNORMAL HIGH (ref 0–149)
VLDL Cholesterol Cal: 33 mg/dL (ref 5–40)

## 2020-07-04 LAB — HEPATIC FUNCTION PANEL
ALT: 23 IU/L (ref 0–32)
AST: 45 IU/L — ABNORMAL HIGH (ref 0–40)
Albumin: 4.4 g/dL (ref 3.7–4.7)
Alkaline Phosphatase: 96 IU/L (ref 48–121)
Bilirubin Total: 0.3 mg/dL (ref 0.0–1.2)
Bilirubin, Direct: 0.11 mg/dL (ref 0.00–0.40)
Total Protein: 6.7 g/dL (ref 6.0–8.5)

## 2020-07-04 LAB — BASIC METABOLIC PANEL
BUN/Creatinine Ratio: 29 — ABNORMAL HIGH (ref 12–28)
BUN: 25 mg/dL (ref 8–27)
CO2: 23 mmol/L (ref 20–29)
Calcium: 9.3 mg/dL (ref 8.7–10.3)
Chloride: 103 mmol/L (ref 96–106)
Creatinine, Ser: 0.86 mg/dL (ref 0.57–1.00)
GFR calc Af Amer: 75 mL/min/{1.73_m2} (ref >59)
GFR calc non Af Amer: 65 mL/min/{1.73_m2} (ref >59)
Glucose: 98 mg/dL (ref 65–99)
Potassium: 4.8 mmol/L (ref 3.5–5.2)
Sodium: 138 mmol/L (ref 134–144)

## 2020-07-04 NOTE — Patient Instructions (Signed)
Medication Instructions:  Your physician recommends that you continue on your current medications as directed. Please refer to the Current Medication list given to you today.  *If you need a refill on your cardiac medications before your next appointment, please call your pharmacy*   Lab Work: TODAY: Liver, lipids, BMET If you have labs (blood work) drawn today and your tests are completely normal, you will receive your results only by:  MyChart Message (if you have MyChart) OR  A paper copy in the mail If you have any lab test that is abnormal or we need to change your treatment, we will call you to review the results.   Follow-Up: At [facility], you and your health needs are our priority.  As part of our continuing mission to provide you with exceptional heart care, we have created designated Provider Care Teams.  These Care Teams include your primary Cardiologist (physician) and Advanced Practice Providers (APPs -  Physician Assistants and Nurse Practitioners) who all work together to provide you with the care you need, when you need it.  We recommend signing up for the patient portal called "MyChart".  Sign up information is provided on this After Visit Summary.  MyChart is used to connect with patients for Virtual Visits (Telemedicine).  Patients are able to view lab/test results, encounter notes, upcoming appointments, etc.  Non-urgent messages can be sent to your provider as well.   To learn more about what you can do with MyChart, go to [email or web address].    Your next appointment:   1 year(s)  The format for your next appointment:   In Person  Provider:   Tereso Newcomer, PA-C or Chelsea Aus, PA-C

## 2020-07-04 NOTE — Progress Notes (Signed)
Cardiology Office Note   Date:  07/04/2020   ID:  Nicole Dean, DOB 15-Jan-1941, MRN [id number]  PCP:  Nicole Silvers, PA-C  Cardiologist:   Nicole Miss, MD   Chief Complaint  Patient presents with  . Hypertension  . AAA    Problem List 1. CAD - s/p CABG  04/04/16   Left internal mammary graft to the LAD  Right internal mammary graft to the PDA  SVG to diagonal        SVG to OM 2.  AAA - s/p repair with Ao- bi fem graft. 07/22/16 Nicole Dean 2. Essential HTN 3. Hyperlipidemia    Previous notes:  Nicole Dean is a 79 y.o. female who presents for pre-op eval prior to AAA repair Was seen with daughter , Nicole Dean .  She has known about this AAA for a while,   Was offered repair but she declined - too much going on Recently found that the AAA had enlarged Does not have any abdominal pain  - has bilateral groin pain with walking   Has leg pain and weakness with climbing stairs.  No real CP or dyspnea   Former smoker - quit 17 year ago .   June 04, 2016: She has had CABG by Dr. Laneta Dean on May 5. She appears to be doing quite well. No CP , breathing is good.  She saw Nicole Dean. PA  on May 30. Her HCTZ was stopped at that time but Nicole Dean developed significant leg edema. The HCTZ has been restarted and she seems to be doing quite well.  Still has not had herr AAA repaired yet  Dec. 8, 2017:  Nicole Dean is seen with son, Nicole Dean .  CABG in May Had AAA repair in Aug Gall bladder surgery Nov. 15, 2017 Nicole Dean )  Had  a wound vac .   Still draining.     February 23, 2017:  Overall doing well .   BP is well controlled. Still healing from her GB surgery  No CP ,  Mar 31, 2018:  Nicole Dean is seen for follow-up of her coronary artery disease, abdominal aortic aneurysm repair, hyperlipidemia, hypertension. Doing well. Getting some exercise  Still has some tenderness in her gall bladder surgery scar .  Had to heal secondarily   June 21, 2019: Nicole Dean is seen today follow-up  for follow-up of her coronary artery disease, abdominal aortic aneurysm repair, hyperlipidemia, hypertension.  Seems to be getting along well now .  Wt today is 135 lbs.    July 04, 2020: Nicole Dean is seen today for follow-up of her coronary artery bypass grafting (May, 2017), abdominal aortic aneurysm repair (August, 2017) ypertension, hyperlipidemia.  No cp,   Some dyspnea,  Usus albuterol on occasion .  Walks regularly ,    Past Medical History:  Diagnosis Date  . AAA (abdominal aortic aneurysm) (HCC)    VVS- following   . Allergy   . Anemia   . Anxiety   . Arthritis    hands  . Blood transfusion without reported diagnosis   . Cataract    removed bilateral   . Cholecystitis 10/2016  . Coronary artery disease   . Depression   . Gastric ulcer 05/27/2017   Dr Nicole Dean  . GERD (gastroesophageal reflux disease)   . History of bronchitis   . History of pneumonia   . Hyperlipidemia   . Hypertension   . Numbness and tingling of right leg   . Shortness of breath  dyspnea    sometimes even with resting; pt. denies since heart surgery  . Wears glasses     Past Surgical History:  Procedure Laterality Date  . ABDOMINAL AORTIC ANEURYSM REPAIR N/A 07/22/2016   Procedure: JUXTARENAL  ABDOMINAL AORTIC ANEURYSM REPAIR;  Surgeon: Chuck Hint, MD;  Location: [facility] OR;  Service: Vascular;  Laterality: N/A;  . AORTA - BILATERAL FEMORAL ARTERY BYPASS GRAFT N/A 07/22/2016   Procedure: AORTA BIFEMORAL BYPASS GRAFT;  Surgeon: Chuck Hint, MD;  Location: [facility] OR;  Service: Vascular;  Laterality: N/A;  . CABG x 4    . CARDIAC CATHETERIZATION N/A 03/31/2016   Procedure: Left Heart Cath and Coronary Angiography;  Surgeon: Peter M Swaziland, MD;  Location: [facility] INVASIVE CV LAB;  Service: Cardiovascular;  Laterality: N/A;  . CHOLECYSTECTOMY N/A 10/15/2016   Procedure: LAPAROSCOPIC CONVERTED TO OPEN CHOLECYSTECTOMY;  Surgeon: Claud Kelp, MD;  Location: [facility] OR;  Service: General;   Laterality: N/A;  . CHOLECYSTECTOMY, LAPAROSCOPIC  10/15/2016  . COLONOSCOPY    . CORONARY ARTERY BYPASS GRAFT N/A 04/04/2016   Procedure: CORONARY ARTERY BYPASS GRAFTING (CABG) x 4 using bilateral internal mammary arteries and right saphenous vein harvested by endovein;  Surgeon: Alleen Borne, MD;  Location: [facility] OR;  Service: Open Heart Surgery;  Laterality: N/A;  . EYE SURGERY Bilateral    cataracts removed./ IOL  . PERIPHERAL VASCULAR CATHETERIZATION N/A 03/03/2016   Procedure: Abdominal Aortogram w/Lower Extremity;  Surgeon: Chuck Hint, MD;  Location: [facility] INVASIVE CV LAB;  Service: Cardiovascular;  Laterality: N/A;  . REPAIR ILIAC ARTERY Right 07/22/2016   Procedure: RIGHT COMMON ILIAC ANEURYSM REPAIR;  Surgeon: Chuck Hint, MD;  Location: [facility] OR;  Service: Vascular;  Laterality: Right;  . SHOULDER ARTHROSCOPY W/ ROTATOR CUFF REPAIR Right   . TEE WITHOUT CARDIOVERSION N/A 04/04/2016   Procedure: TRANSESOPHAGEAL ECHOCARDIOGRAM (TEE);  Surgeon: Alleen Borne, MD;  Location: [facility] OR;  Service: Open Heart Surgery;  Laterality: N/A;  . UPPER GASTROINTESTINAL ENDOSCOPY       Current Outpatient Medications  Medication Sig Dispense Refill  . acetaminophen (TYLENOL) 325 MG tablet Take 2 tablets (650 mg total) by mouth every 4 (four) hours as needed for mild pain, moderate pain or fever.    Marland Kitchen albuterol (VENTOLIN HFA) 108 (90 Base) MCG/ACT inhaler INHALE 2 PUFFS INTO THE LUNGS EVERY 6 (SIX) HOURS AS NEEDED FOR SHORTNESS OF BREATH.    . calcium carbonate (TUMS - DOSED IN MG ELEMENTAL CALCIUM) 500 MG chewable tablet Chew 5 tablets by mouth as needed.     . carvedilol (COREG) 6.25 MG tablet TAKE 1 TABLET BY MOUTH TWICE A DAY 60 tablet 6  . enalapril (VASOTEC) 20 MG tablet Take 20 mg by mouth daily.    . hydrochlorothiazide (HYDRODIURIL) 25 MG tablet Take 25 mg by mouth daily.    Marland Kitchen LORazepam (ATIVAN) 1 MG tablet Take 1 mg by mouth 2 (two) times daily as needed.    . Multiple Vitamins-Iron  (MULTIVITAMIN/IRON PO) Take 1 tablet by mouth daily.    Marland Kitchen omeprazole (PRILOSEC) 40 MG capsule Take 40 mg by mouth daily.    . rosuvastatin (CRESTOR) 20 MG tablet Take 1 tablet (20 mg total) by mouth daily. 90 tablet 3  . senna (SENOKOT) 8.6 MG TABS tablet Take 1 tablet (8.6 mg total) by mouth 2 (two) times daily. 30 each 0  . sertraline (ZOLOFT) 100 MG tablet Take 2 tablets by mouth daily.    . traMADol (ULTRAM) 50  MG tablet Take 50 mg by mouth every 12 (twelve) hours as needed for pain.  5  . traZODone (DESYREL) 100 MG tablet Take 1 tablet by mouth at bedtime. For sleep    . triamcinolone ointment (KENALOG) 0.1 % Apply 1 application topically 3 (three) times daily as needed. As needed for allergic reaction     No current facility-administered medications for this visit.    Allergies:   Codeine and Penicillins    Social History:  The patient  reports that she quit smoking about 24 years ago. Her smoking use included cigarettes. She has a 52.50 pack-year smoking history. She has never used smokeless tobacco. She reports that she does not drink alcohol and does not use drugs.   Family History:  The patient's family history includes Cancer in her father; Colon polyps in her brother; Heart disease in her maternal grandfather and maternal uncle; Stomach cancer in her brother and father.    ROS:   Noted in current history, otherwise review of systems is negative.   Physical Exam: Blood pressure (!) 110/54, pulse 63, height 5\' 2"  (1.575 m), weight 126 lb 12.8 oz (57.5 kg), SpO2 94 %.  GEN:  Well nourished, well developed in no acute distress HEENT: Normal NECK: No JVD; No carotid bruits LYMPHATICS: No lymphadenopathy CARDIAC: RRR , no murmurs, rubs, gallops RESPIRATORY:  Clear to auscultation without rales, wheezing or rhonchi  ABDOMEN: Soft, non-tender, non-distended MUSCULOSKELETAL:  No edema; No deformity  SKIN: Warm and dry NEUROLOGIC:  Alert and oriented x 3    EKG: July 04, 2020: Normal sinus rhythm at 63.  No ST or T wave changes.   Recent Labs: 07/26/2019: BUN 29; Creatinine, Ser 0.80; Potassium 4.8; Sodium 138    Lipid Panel    Component Value Date/Time   CHOL 155 06/21/2019 0955   TRIG 227 (H) 06/21/2019 0955   HDL 41 06/21/2019 0955   CHOLHDL 3.8 06/21/2019 0955   CHOLHDL 4.7 09/01/2012 1429   VLDL 60 (H) 09/01/2012 1429   LDLCALC 69 06/21/2019 0955   LDLDIRECT 82 03/23/2009 2152      Wt Readings from Last 3 Encounters:  07/04/20 126 lb 12.8 oz (57.5 kg)  06/21/19 135 lb 1.9 oz (61.3 kg)  03/31/18 139 lb (63 kg)      Other studies Reviewed: Additional studies/ records that were reviewed today include: . Review of the above records demonstrates:    ASSESSMENT AND PLAN:  1.  AAA -     S/p repair.  Doing well     2. CAD :   She had an abnormal Myoview study and subsequent cardiac catheterization revealed significant coronary artery disease. She's now status post coronary artery bypass grafting in 2017  Left internal mammary graft to the LAD  Right internal mammary graft to the PDA  SVG to diagonal  SVG to OM   She is doing well.  She is not had any episodes of angina.  3. Essential hypertension:     Her blood pressure is very well controlled.  Continue current medications.  4.  Hyperlipidemia :   We will check lipids, liver enzymes, basic metabolic profile today.  Continue current medications.   Current medicines are reviewed at length with the patient today.  The patient does not have concerns regarding medicines.  The following changes have been made:  no change  Labs/ tests ordered today include:   No orders of the defined types were placed in this encounter.  Disposition:        Nicole MissPhilip Anthonette Lesage, MD  07/04/2020 9:26 AM    [facility] [address], [place], [place]  [address] Phone: [phone]) [phone]; Fax: [phone]

## 2020-07-06 ENCOUNTER — Telehealth: Payer: Self-pay

## 2020-07-06 NOTE — Telephone Encounter (Signed)
-----   Message from Vesta Mixer, MD sent at 07/04/2020  4:41 PM EDT ----- Labs are stable  AST is mildly elevated but only barely so  Trigs are mildly elevated.   She should continue to work on improving her diet, exercise, weight loss

## 2020-07-06 NOTE — Telephone Encounter (Signed)
The patient has been notified of the result and verbalized understanding.  All questions (if any) were answered. Leanord Hawking, RN 07/06/2020 8:27 AM

## 2020-09-04 ENCOUNTER — Other Ambulatory Visit: Payer: Self-pay

## 2020-09-04 ENCOUNTER — Encounter: Payer: Self-pay | Admitting: Physician Assistant

## 2020-09-04 ENCOUNTER — Other Ambulatory Visit: Payer: Self-pay | Admitting: *Deleted

## 2020-09-04 ENCOUNTER — Ambulatory Visit (INDEPENDENT_AMBULATORY_CARE_PROVIDER_SITE_OTHER): Payer: Medicare HMO | Admitting: Physician Assistant

## 2020-09-04 VITALS — BP 113/64 | HR 61 | Temp 98.5°F | Resp 20 | Ht 62.0 in | Wt 130.2 lb

## 2020-09-04 DIAGNOSIS — I714 Abdominal aortic aneurysm, without rupture, unspecified: Secondary | ICD-10-CM

## 2020-09-04 DIAGNOSIS — I70209 Unspecified atherosclerosis of native arteries of extremities, unspecified extremity: Secondary | ICD-10-CM

## 2020-09-04 DIAGNOSIS — Z95828 Presence of other vascular implants and grafts: Secondary | ICD-10-CM

## 2020-09-04 NOTE — Progress Notes (Signed)
Office Note     CC:  follow up Requesting Provider:  Charlies Silvers, PA*  HPI: Nicole Dean is a 79 y.o. ([birth year]) female who has history of an aortobifemoral bypass by Dr. 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 She was last seen in October of 2017. She was doing well at that time with some right groin pain that was expected to resolve on its own. She had a duplex that showed no pseudoaneurysm. She otherwise was not having any lower extremity symptoms. She was advised to follow up in several weeks but presents today for evaluation of a "swollen knot in her right leg".  The swollen area in her right leg appeared 1 month ago. She does not report any injury/ trauma to the area. She says that the pain is a throbbing pain that is worsened on prolonged standing. At times the area seems more swollen then others. The "knot" has not seemed to move or increase in size. She does have some radiation of the pain to behind her right knee. She otherwise does not have any claudication symptoms, rest pain, or non healing wounds. She says prior to this appearing she has been doing great. She does not have any swelling in her legs. She has not had any issues with her varicose veins. She does have history of vein harvesting from RLE for CABG. She denies any abdominal or back pain.  The pt is on a statin for cholesterol management.  The pt is not on a daily aspirin.   Other AC: none The pt is on ACE, BB, HCTZ for hypertension.   The pt is not diabetic.   Tobacco hx: former smoker, quit 1997  Past Medical History:  Diagnosis Date  . AAA (abdominal aortic aneurysm) (HCC)    VVS- following   . Allergy   . Anemia   . Anxiety   . Arthritis    hands  . Blood transfusion without reported diagnosis   . Cataract    removed bilateral   . Cholecystitis 10/2016  . Coronary artery disease   . Depression   . Gastric ulcer 05/27/2017   Dr Adela Lank  . GERD (gastroesophageal reflux disease)   .  History of bronchitis   . History of pneumonia   . Hyperlipidemia   . Hypertension   . Numbness and tingling of right leg   . Shortness of breath dyspnea    sometimes even with resting; pt. denies since heart surgery  . Wears glasses     Past Surgical History:  Procedure Laterality Date  . ABDOMINAL AORTIC ANEURYSM REPAIR N/A 07/22/2016   Procedure: JUXTARENAL  ABDOMINAL AORTIC ANEURYSM REPAIR;  Surgeon: Chuck Hint, MD;  Location: [facility] OR;  Service: Vascular;  Laterality: N/A;  . AORTA - BILATERAL FEMORAL ARTERY BYPASS GRAFT N/A 07/22/2016   Procedure: AORTA BIFEMORAL BYPASS GRAFT;  Surgeon: Chuck Hint, MD;  Location: [facility] OR;  Service: Vascular;  Laterality: N/A;  . CABG x 4    . CARDIAC CATHETERIZATION N/A 03/31/2016   Procedure: Left Heart Cath and Coronary Angiography;  Surgeon: Peter M Swaziland, MD;  Location: [facility] INVASIVE CV LAB;  Service: Cardiovascular;  Laterality: N/A;  . CHOLECYSTECTOMY N/A 10/15/2016   Procedure: LAPAROSCOPIC CONVERTED TO OPEN CHOLECYSTECTOMY;  Surgeon: Claud Kelp, MD;  Location: [facility] OR;  Service: General;  Laterality: N/A;  . CHOLECYSTECTOMY, LAPAROSCOPIC  10/15/2016  . COLONOSCOPY    . CORONARY ARTERY BYPASS GRAFT N/A 04/04/2016   Procedure: CORONARY ARTERY  BYPASS GRAFTING (CABG) x 4 using bilateral internal mammary arteries and right saphenous vein harvested by endovein;  Surgeon: Alleen Borne, MD;  Location: [facility] OR;  Service: Open Heart Surgery;  Laterality: N/A;  . EYE SURGERY Bilateral    cataracts removed./ IOL  . PERIPHERAL VASCULAR CATHETERIZATION N/A 03/03/2016   Procedure: Abdominal Aortogram w/Lower Extremity;  Surgeon: Chuck Hint, MD;  Location: [facility] INVASIVE CV LAB;  Service: Cardiovascular;  Laterality: N/A;  . REPAIR ILIAC ARTERY Right 07/22/2016   Procedure: RIGHT COMMON ILIAC ANEURYSM REPAIR;  Surgeon: Chuck Hint, MD;  Location: [facility] OR;  Service: Vascular;  Laterality: Right;  . SHOULDER ARTHROSCOPY W/ ROTATOR  CUFF REPAIR Right   . TEE WITHOUT CARDIOVERSION N/A 04/04/2016   Procedure: TRANSESOPHAGEAL ECHOCARDIOGRAM (TEE);  Surgeon: Alleen Borne, MD;  Location: [facility] OR;  Service: Open Heart Surgery;  Laterality: N/A;  . UPPER GASTROINTESTINAL ENDOSCOPY      Social History   Socioeconomic History  . Marital status: Divorced    Spouse name: Not on file  . Number of children: Not on file  . Years of education: Not on file  . Highest education level: Not on file  Occupational History  . Not on file  Tobacco Use  . Smoking status: Former Smoker    Packs/day: 1.50    Years: 35.00    Pack years: 52.50    Types: Cigarettes    Quit date: 02/15/1996    Years since quitting: 24.5  . Smokeless tobacco: Never Used  Vaping Use  . Vaping Use: Never used  Substance and Sexual Activity  . Alcohol use: No    Alcohol/week: 0.0 standard drinks  . Drug use: No  . Sexual activity: Not on file  Other Topics Concern  . Not on file  Social History Narrative  . Not on file   Social Determinants of Health   Financial Resource Strain:   . Difficulty of Paying Living Expenses: Not on file  Food Insecurity:   . Worried About Programme researcher, broadcasting/film/video in the Last Year: Not on file  . Ran Out of Food in the Last Year: Not on file  Transportation Needs:   . Lack of Transportation (Medical): Not on file  . Lack of Transportation (Non-Medical): Not on file  Physical Activity:   . Days of Exercise per Week: Not on file  . Minutes of Exercise per Session: Not on file  Stress:   . Feeling of Stress : Not on file  Social Connections:   . Frequency of Communication with Friends and Family: Not on file  . Frequency of Social Gatherings with Friends and Family: Not on file  . Attends Religious Services: Not on file  . Active Member of Clubs or Organizations: Not on file  . Attends Banker Meetings: Not on file  . Marital Status: Not on file  Intimate Partner Violence:   . Fear of Current or  Ex-Partner: Not on file  . Emotionally Abused: Not on file  . Physically Abused: Not on file  . Sexually Abused: Not on file    Family History  Problem Relation Age of Onset  . Cancer Father        stomach  . Stomach cancer Father   . Heart disease Maternal Grandfather   . Stomach cancer Brother   . Heart disease Maternal Uncle   . Colon polyps Brother   . Colon cancer Neg Hx   . Esophageal cancer Neg Hx   .  Rectal cancer Neg Hx     Current Outpatient Medications  Medication Sig Dispense Refill  . acetaminophen (TYLENOL) 325 MG tablet Take 2 tablets (650 mg total) by mouth every 4 (four) hours as needed for mild pain, moderate pain or fever.    Marland Kitchen albuterol (VENTOLIN HFA) 108 (90 Base) MCG/ACT inhaler INHALE 2 PUFFS INTO THE LUNGS EVERY 6 (SIX) HOURS AS NEEDED FOR SHORTNESS OF BREATH.    . calcium carbonate (TUMS - DOSED IN MG ELEMENTAL CALCIUM) 500 MG chewable tablet Chew 5 tablets by mouth as needed.     . carvedilol (COREG) 6.25 MG tablet TAKE 1 TABLET BY MOUTH TWICE A DAY 60 tablet 6  . enalapril (VASOTEC) 20 MG tablet Take 20 mg by mouth daily.    . hydrochlorothiazide (HYDRODIURIL) 25 MG tablet Take 25 mg by mouth daily.    Marland Kitchen LORazepam (ATIVAN) 1 MG tablet Take 1 mg by mouth 2 (two) times daily as needed.    . Multiple Vitamins-Iron (MULTIVITAMIN/IRON PO) Take 1 tablet by mouth daily.    Marland Kitchen omeprazole (PRILOSEC) 40 MG capsule Take 40 mg by mouth daily.    . rosuvastatin (CRESTOR) 20 MG tablet Take 1 tablet (20 mg total) by mouth daily. 90 tablet 3  . senna (SENOKOT) 8.6 MG TABS tablet Take 1 tablet (8.6 mg total) by mouth 2 (two) times daily. 30 each 0  . sertraline (ZOLOFT) 100 MG tablet Take 2 tablets by mouth daily.    . traMADol (ULTRAM) 50 MG tablet Take 50 mg by mouth every 12 (twelve) hours as needed for pain.  5  . traZODone (DESYREL) 100 MG tablet Take 1 tablet by mouth at bedtime. For sleep    . triamcinolone ointment (KENALOG) 0.1 % Apply 1 application topically 3  (three) times daily as needed. As needed for allergic reaction     No current facility-administered medications for this visit.    Allergies  Allergen Reactions  . Codeine Nausea And Vomiting    Severe, ended up in ED  . Penicillins Nausea Only and Other (See Comments)    Has patient had a PCN reaction causing immediate rash, facial/tongue/throat swelling, SOB or lightheadedness with hypotension: Yes Has patient had a PCN reaction causing severe rash involving mucus membranes or skin necrosis: No Has patient had a PCN reaction that required hospitalization No Has patient had a PCN reaction occurring within the last 10 years: No If all of the above answers are "NO", then may proceed with Cephalosporin use.      REVIEW OF SYSTEMS: Review of Systems  Constitutional: Negative for chills and fever.  HENT: Negative for congestion and sore throat.   Eyes: Negative for blurred vision.  Respiratory: Negative for cough and shortness of breath.   Cardiovascular: Negative for chest pain, palpitations, claudication and leg swelling.  Gastrointestinal: Negative for abdominal pain, blood in stool, constipation, diarrhea, heartburn, nausea and vomiting.  Genitourinary: Negative for dysuria.  Musculoskeletal: Negative for back pain.  Skin: Negative for rash.  Neurological: Negative for dizziness, weakness and headaches.  Endo/Heme/Allergies: Does not bruise/bleed easily.    PHYSICAL EXAMINATION:  Vitals:   09/04/20 0911  BP: 113/64  Pulse: 61  Resp: 20  Temp: 98.5 F (36.9 C)  TempSrc: Temporal  SpO2: 96%  Weight: 130 lb 3.2 oz (59.1 kg)  Height: 5\' 2"  (1.575 m)    General:  WDWN in NAD; vital signs documented above Gait: Normal HENT: WNL, normocephalic Pulmonary: normal non-labored breathing , without wheezing Cardiac:  regular HR, without  Murmurs without carotid bruit Abdomen: soft, NT, no masses. Laparotomy scar well healed Vascular Exam/Pulses:  Right Left  Radial 2+  (normal) 2+ (normal)  Ulnar 2+ (normal) 2+ (normal)  Femoral 2+ (normal) 2+ (normal)  Popliteal 2+ (normal) 2+ (normal)  DP 2+ (normal) 2+ (normal)  PT 2+ (normal) 2+ (normal)   Extremities: without ischemic changes, without Gangrene , without cellulitis; without open wounds; she has a pea sized tender nodule in her proximal medial right leg. There is some surrounding fullness. This is tender to palpation as well. She has no overlying skin changes. She otherwise has no swelling or edema in her legs. Bilateral feet are warm and well perfused Musculoskeletal: no muscle wasting or atrophy  Neurologic: A&O X 3;  No focal weakness or paresthesias are detected Psychiatric:  The pt has Normal affect.   ASSESSMENT/PLAN:: 79 y.o. female here for follow up for swelling and knot in right leg. Area of concern is not pulsatile. It is tender to palpation. I do not think it is anything arterial. I assume this is probably an area of superficial venous thrombosis vs just chronic venous reflux. Clinically I do not think there is any acute infection. - warm compresses PRN to right leg - Tylenol as needed for pain - She will return in next 1-2 weeks for RLE reflux duplex and office visit - She will be due for her 5 year follow up CTA of her aortobifemoral bypass in 2022   Graceann Congressorrina Barbie Croston, PA-C [facility] [phone]  Clinic MD:  Dr. Chestine Sporelark

## 2020-09-21 ENCOUNTER — Other Ambulatory Visit: Payer: Self-pay

## 2020-09-21 ENCOUNTER — Ambulatory Visit (INDEPENDENT_AMBULATORY_CARE_PROVIDER_SITE_OTHER): Payer: Medicare HMO | Admitting: Physician Assistant

## 2020-09-21 ENCOUNTER — Ambulatory Visit (HOSPITAL_COMMUNITY)
Admission: RE | Admit: 2020-09-21 | Discharge: 2020-09-21 | Disposition: A | Discharge status: Home / Self Care | Payer: Medicare HMO | Source: Ambulatory Visit | Attending: Physician Assistant | Admitting: Physician Assistant

## 2020-09-21 ENCOUNTER — Encounter: Payer: Self-pay | Admitting: Physician Assistant

## 2020-09-21 VITALS — BP 111/64 | HR 93 | Temp 98.4°F | Resp 20 | Ht 62.0 in | Wt 129.6 lb

## 2020-09-21 DIAGNOSIS — Z95828 Presence of other vascular implants and grafts: Secondary | ICD-10-CM

## 2020-09-21 DIAGNOSIS — I82811 Embolism and thrombosis of superficial veins of right lower extremities: Secondary | ICD-10-CM | POA: Diagnosis not present

## 2020-09-21 DIAGNOSIS — I872 Venous insufficiency (chronic) (peripheral): Secondary | ICD-10-CM

## 2020-09-21 DIAGNOSIS — I70209 Unspecified atherosclerosis of native arteries of extremities, unspecified extremity: Secondary | ICD-10-CM | POA: Diagnosis present

## 2020-09-21 NOTE — Progress Notes (Signed)
Office Note     CC:  follow up Requesting Provider:  Charlies Silversouillard, Jennifer, PA*  HPI: Nicole Dean is a 79 y.o. (June 08, 1941) female who presents for follow up of swollen knot in right leg. She was recently seen on 09/04/20. The swollen area in her right leg appeared >1 month ago. She says now the area is much better. It is not sore and she can barely feel a knot. She has not had any issues with her varicose veins. She does have history of vein harvesting from RLE for CABG. She has not had any issues with her legs as a result of this. She does not have any swelling in her legs.  She has history of an aortobifemoral bypass by Dr. Edilia Boickson on 07/22/16 for a 6 cm juxtarenal aneurysm. She denies any abdominal or back pain.   The pt is on a statin for cholesterol management.  The pt is not on a daily aspirin.   Other AC: none The pt is on ACE, BB, HCTZ for hypertension.   The pt is not diabetic.   Tobacco hx: former smoker, quit 1997  Past Medical History:  Diagnosis Date  . AAA (abdominal aortic aneurysm) (HCC)    VVS- following   . Allergy   . Anemia   . Anxiety   . Arthritis    hands  . Blood transfusion without reported diagnosis   . Cataract    removed bilateral   . Cholecystitis 10/2016  . Coronary artery disease   . Depression   . Gastric ulcer 05/27/2017   Dr Adela LankArmbruster  . GERD (gastroesophageal reflux disease)   . History of bronchitis   . History of pneumonia   . Hyperlipidemia   . Hypertension   . Numbness and tingling of right leg   . Shortness of breath dyspnea    sometimes even with resting; pt. denies since heart surgery  . Wears glasses     Past Surgical History:  Procedure Laterality Date  . ABDOMINAL AORTIC ANEURYSM REPAIR N/A 07/22/2016   Procedure: JUXTARENAL  ABDOMINAL AORTIC ANEURYSM REPAIR;  Surgeon: Chuck Hinthristopher S Dickson, MD;  [facility];  Service: Vascular;  Laterality: N/A;  . AORTA - BILATERAL FEMORAL ARTERY BYPASS GRAFT N/A 07/22/2016    Procedure: AORTA BIFEMORAL BYPASS GRAFT;  Surgeon: Chuck Hinthristopher S Dickson, MD;  [facility];  Service: Vascular;  Laterality: N/A;  . CABG x 4    . CARDIAC CATHETERIZATION N/A 03/31/2016   Procedure: Left Heart Cath and Coronary Angiography;  Surgeon: Peter M SwazilandJordan, MD;  [facility];  Service: Cardiovascular;  Laterality: N/A;  . CHOLECYSTECTOMY N/A 10/15/2016   Procedure: LAPAROSCOPIC CONVERTED TO OPEN CHOLECYSTECTOMY;  Surgeon: Claud KelpHaywood Ingram, MD;  Location: [facility] OR;  Service: General;  Laterality: N/A;  . CHOLECYSTECTOMY, LAPAROSCOPIC  10/15/2016  . COLONOSCOPY    . CORONARY ARTERY BYPASS GRAFT N/A 04/04/2016   Procedure: CORONARY ARTERY BYPASS GRAFTING (CABG) x 4 using bilateral internal mammary arteries and right saphenous vein harvested by endovein;  Surgeon: Alleen BorneBryan K Bartle, MD;  Location: [facility] OR;  Service: Open Heart Surgery;  Laterality: N/A;  . EYE SURGERY Bilateral    cataracts removed./ IOL  . PERIPHERAL VASCULAR CATHETERIZATION N/A 03/03/2016   Procedure: Abdominal Aortogram w/Lower Extremity;  Surgeon: Chuck Hinthristopher S Dickson, MD;  [facility];  Service: Cardiovascular;  Laterality: N/A;  . REPAIR ILIAC ARTERY Right 07/22/2016   Procedure: RIGHT COMMON ILIAC ANEURYSM REPAIR;  Surgeon: Chuck Hinthristopher S Dickson, MD;  Location: [facility] OR;  Service: Vascular;  Laterality: Right;  . SHOULDER ARTHROSCOPY W/ ROTATOR CUFF REPAIR Right   . TEE WITHOUT CARDIOVERSION N/A 04/04/2016   Procedure: TRANSESOPHAGEAL ECHOCARDIOGRAM (TEE);  Surgeon: Alleen Borne, MD;  Location: [facility] OR;  Service: Open Heart Surgery;  Laterality: N/A;  . UPPER GASTROINTESTINAL ENDOSCOPY      Social History   Socioeconomic History  . Marital status: Divorced    Spouse name: Not on file  . Number of children: Not on file  . Years of education: Not on file  . Highest education level: Not on file  Occupational History  . Not on file  Tobacco Use  . Smoking status: Former Smoker    Packs/day:  1.50    Years: 35.00    Pack years: 52.50    Types: Cigarettes    Quit date: 02/15/1996    Years since quitting: 24.6  . Smokeless tobacco: Never Used  Vaping Use  . Vaping Use: Never used  Substance and Sexual Activity  . Alcohol use: No    Alcohol/week: 0.0 standard drinks  . Drug use: No  . Sexual activity: Not on file  Other Topics Concern  . Not on file  Social History Narrative  . Not on file   Social Determinants of Health   Financial Resource Strain:   . Difficulty of Paying Living Expenses: Not on file  Food Insecurity:   . Worried About Programme researcher, broadcasting/film/video in the Last Year: Not on file  . Ran Out of Food in the Last Year: Not on file  Transportation Needs:   . Lack of Transportation (Medical): Not on file  . Lack of Transportation (Non-Medical): Not on file  Physical Activity:   . Days of Exercise per Week: Not on file  . Minutes of Exercise per Session: Not on file  Stress:   . Feeling of Stress : Not on file  Social Connections:   . Frequency of Communication with Friends and Family: Not on file  . Frequency of Social Gatherings with Friends and Family: Not on file  . Attends Religious Services: Not on file  . Active Member of Clubs or Organizations: Not on file  . Attends Banker Meetings: Not on file  . Marital Status: Not on file  Intimate Partner Violence:   . Fear of Current or Ex-Partner: Not on file  . Emotionally Abused: Not on file  . Physically Abused: Not on file  . Sexually Abused: Not on file    Family History  Problem Relation Age of Onset  . Cancer Father        stomach  . Stomach cancer Father   . Heart disease Maternal Grandfather   . Stomach cancer Brother   . Heart disease Maternal Uncle   . Colon polyps Brother   . Colon cancer Neg Hx   . Esophageal cancer Neg Hx   . Rectal cancer Neg Hx     Current Outpatient Medications  Medication Sig Dispense Refill  . acetaminophen (TYLENOL) 325 MG tablet Take 2 tablets  (650 mg total) by mouth every 4 (four) hours as needed for mild pain, moderate pain or fever. (Patient not taking: Reported on 09/21/2020)    . albuterol (VENTOLIN HFA) 108 (90 Base) MCG/ACT inhaler INHALE 2 PUFFS INTO THE LUNGS EVERY 6 (SIX) HOURS AS NEEDED FOR SHORTNESS OF BREATH. (Patient not taking: Reported on 09/21/2020)    . calcium carbonate (TUMS - DOSED IN MG ELEMENTAL CALCIUM) 500  MG chewable tablet Chew 5 tablets by mouth as needed.  (Patient not taking: Reported on 09/21/2020)    . carvedilol (COREG) 6.25 MG tablet TAKE 1 TABLET BY MOUTH TWICE A DAY (Patient not taking: Reported on 09/21/2020) 60 tablet 6  . enalapril (VASOTEC) 20 MG tablet Take 20 mg by mouth daily. (Patient not taking: Reported on 09/21/2020)    . hydrochlorothiazide (HYDRODIURIL) 25 MG tablet Take 25 mg by mouth daily. (Patient not taking: Reported on 09/21/2020)    . LORazepam (ATIVAN) 1 MG tablet Take 1 mg by mouth 2 (two) times daily as needed. (Patient not taking: Reported on 09/21/2020)    . Multiple Vitamins-Iron (MULTIVITAMIN/IRON PO) Take 1 tablet by mouth daily. (Patient not taking: Reported on 09/21/2020)    . omeprazole (PRILOSEC) 40 MG capsule Take 40 mg by mouth daily. (Patient not taking: Reported on 09/21/2020)    . rosuvastatin (CRESTOR) 20 MG tablet Take 1 tablet (20 mg total) by mouth daily. (Patient not taking: Reported on 09/21/2020) 90 tablet 3  . senna (SENOKOT) 8.6 MG TABS tablet Take 1 tablet (8.6 mg total) by mouth 2 (two) times daily. (Patient not taking: Reported on 09/21/2020) 30 each 0  . sertraline (ZOLOFT) 100 MG tablet Take 2 tablets by mouth daily. (Patient not taking: Reported on 09/21/2020)    . traMADol (ULTRAM) 50 MG tablet Take 50 mg by mouth every 12 (twelve) hours as needed for pain. (Patient not taking: Reported on 09/21/2020)  5  . traZODone (DESYREL) 100 MG tablet Take 1 tablet by mouth at bedtime. For sleep (Patient not taking: Reported on 09/21/2020)    . triamcinolone  ointment (KENALOG) 0.1 % Apply 1 application topically 3 (three) times daily as needed. As needed for allergic reaction (Patient not taking: Reported on 09/21/2020)     No current facility-administered medications for this visit.    Allergies  Allergen Reactions  . Codeine Nausea And Vomiting    Severe, ended up in ED  . Penicillins Nausea Only and Other (See Comments)    Has patient had a PCN reaction causing immediate rash, facial/tongue/throat swelling, SOB or lightheadedness with hypotension: Yes Has patient had a PCN reaction causing severe rash involving mucus membranes or skin necrosis: No Has patient had a PCN reaction that required hospitalization No Has patient had a PCN reaction occurring within the last 10 years: No If all of the above answers are "NO", then may proceed with Cephalosporin use.      REVIEW OF SYSTEMS:  [X]  denotes positive finding, [ ]  denotes negative finding Cardiac  Comments:  Chest pain or chest pressure:    Shortness of breath upon exertion:    Short of breath when lying flat:    Irregular heart rhythm:        Vascular    Pain in calf, thigh, or hip brought on by ambulation:    Pain in feet at night that wakes you up from your sleep:     Blood clot in your veins:    Leg swelling:         Pulmonary    Oxygen at home:    Productive cough:     Wheezing:         Neurologic    Sudden weakness in arms or legs:     Sudden numbness in arms or legs:     Sudden onset of difficulty speaking or slurred speech:    Temporary loss of vision in one eye:  Problems with dizziness:         Gastrointestinal    Blood in stool:     Vomited blood:         Genitourinary    Burning when urinating:     Blood in urine:        Psychiatric    Major depression:         Hematologic    Bleeding problems:    Problems with blood clotting too easily:        Skin    Rashes or ulcers:        Constitutional    Fever or chills:      PHYSICAL  EXAMINATION:  Vitals:   09/21/20 1416  BP: 111/64  Pulse: 93  Resp: 20  Temp: 98.4 F (36.9 C)  TempSrc: Temporal  SpO2: 96%  Weight: 129 lb 9.6 oz (58.8 kg)  Height: 5\' 2"  (1.575 m)    General:  WDWN in NAD; vital signs documented above Gait: Normal HENT: WNL, normocephalic Pulmonary: normal non-labored breathing , without wheezing Cardiac: regular HR, without  Murmurs without carotid bruit Abdomen: soft, NT, no masses Vascular Exam/Pulses:  Right Left  Radial 2+ (normal) 2+ (normal)  Femoral 2+ (normal) 2+ (normal)  Popliteal 2+ (normal) 2+ (normal)  DP 2+ (normal) 2+ (normal)  PT 2+ (normal) 2+ (normal)   Extremities: without ischemic changes, without Gangrene , without cellulitis; without open wounds; she has a 1 cm sized area now of fullness in her proximal medial right leg. There is no tenderness. She has no overlying skin changes. She otherwise has no swelling or edema in her legs. Bilateral feet are warm and well perfused. She does have multiple varicose veins and reticular veins which are not tender  Musculoskeletal: no muscle wasting or atrophy  Neurologic: A&O X 3;  No focal weakness or paresthesias are detected Psychiatric:  The pt has Normal affect.   Non-Invasive Vascular Imaging:   09/21/20 VAS 09/23/20 Lower Extremity Venous Reflux Right:  - No evidence of deep vein thrombosis seen in the right lower extremity, from the common femoral through the popliteal veins.  - No evidence of superficial venous reflux seen in the right short saphenous vein.  - Venous reflux is noted in the right common femoral vein.  - Greater saphenous vein harvested.  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 female here for follow up for swelling and knot in right leg. Area of concern has improved and almost completely resolved. 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 I have encouraged her to elevate  her legs. She also can continue to use warm compresses on the area in her right calf as needed. She otherwise has very well perfused lower extremities and no edema or swelling.  - Advised her return sooner should she have new or worsening symptoms - She will be due for her 5 year follow up CTA of her aortobifemoral bypass in 2022 which I will schedule for her to see Nicole Dean in follow up   [name], PA-C [facility] [phone]  Clinic MD:  Dr. [phone]

## 2021-04-03 ENCOUNTER — Encounter (INDEPENDENT_AMBULATORY_CARE_PROVIDER_SITE_OTHER): Payer: Medicare HMO | Admitting: Ophthalmology

## 2021-07-11 ENCOUNTER — Other Ambulatory Visit: Payer: Self-pay

## 2021-07-11 ENCOUNTER — Encounter: Payer: Self-pay | Admitting: Physician Assistant

## 2021-07-11 ENCOUNTER — Ambulatory Visit (INDEPENDENT_AMBULATORY_CARE_PROVIDER_SITE_OTHER): Payer: Medicare HMO | Admitting: Physician Assistant

## 2021-07-11 VITALS — BP 118/58 | HR 67 | Ht 60.0 in | Wt 130.2 lb

## 2021-07-11 DIAGNOSIS — I10 Essential (primary) hypertension: Secondary | ICD-10-CM | POA: Diagnosis not present

## 2021-07-11 DIAGNOSIS — E785 Hyperlipidemia, unspecified: Secondary | ICD-10-CM | POA: Diagnosis not present

## 2021-07-11 DIAGNOSIS — I70209 Unspecified atherosclerosis of native arteries of extremities, unspecified extremity: Secondary | ICD-10-CM

## 2021-07-11 DIAGNOSIS — I2510 Atherosclerotic heart disease of native coronary artery without angina pectoris: Secondary | ICD-10-CM | POA: Diagnosis not present

## 2021-07-11 MED ORDER — ASPIRIN EC 81 MG PO TBEC: 81.0000 mg | DELAYED_RELEASE_TABLET | Freq: Every day | ORAL | 11 refills | Status: DC

## 2021-07-11 NOTE — Progress Notes (Signed)
Cardiology Office Note:    Date:  07/11/2021   ID:  Nicole Dean, DOB 22-Feb-1941, MRN [id number]  PCP:  Charlies Silvers, PA-C  [facility] Cardiologist:  Kristeen Miss, MD  [facility] Electrophysiologist:  None   Chief Complaint: yearly follow up   History of Present Illness:    Nicole Dean is a 80 y.o. female with a hx of CAD s/p CABG 03/2016 (LIMA to LAD, RIMA to PDA, SVG to dig & SVG to OM), AAA s/p repair with Ao- bi fem graft. 07/22/16 -by Dr. [name], HTN and HLD seen for follow up.   Patient is here for yearly follow-up.  She is not taking aspirin for long time for unknown treatment.  Denies chest pain, shortness of breath, dizziness, palpitation, orthopnea, PND, syncope or melena.  She has been compliant with her medication.  Lives by herself and does household chores without any problem.  Past Medical History:  Diagnosis Date   AAA (abdominal aortic aneurysm) (HCC)    VVS- following    Allergy    Anemia    Anxiety    Arthritis    hands   Blood transfusion without reported diagnosis    Cataract    removed bilateral    Cholecystitis 10/2016   Coronary artery disease    Depression    Gastric ulcer 05/27/2017   Dr Adela Lank   GERD (gastroesophageal reflux disease)    History of bronchitis    History of pneumonia    Hyperlipidemia    Hypertension    Numbness and tingling of right leg    Shortness of breath dyspnea    sometimes even with resting; pt. denies since heart surgery   Wears glasses     Past Surgical History:  Procedure Laterality Date   ABDOMINAL AORTIC ANEURYSM REPAIR N/A 07/22/2016   Procedure: JUXTARENAL  ABDOMINAL AORTIC ANEURYSM REPAIR;  Surgeon: Chuck Hint, MD;  Location: [facility] OR;  Service: Vascular;  Laterality: N/A;   AORTA - BILATERAL FEMORAL ARTERY BYPASS GRAFT N/A 07/22/2016   Procedure: AORTA BIFEMORAL BYPASS GRAFT;  Surgeon: Chuck Hint, MD;  Location: [facility] OR;  Service: Vascular;  Laterality: N/A;   CABG x 4      CARDIAC CATHETERIZATION N/A 03/31/2016   Procedure: Left Heart Cath and Coronary Angiography;  Surgeon: Peter M Swaziland, MD;  Location: [facility] INVASIVE CV LAB;  Service: Cardiovascular;  Laterality: N/A;   CHOLECYSTECTOMY N/A 10/15/2016   Procedure: LAPAROSCOPIC CONVERTED TO OPEN CHOLECYSTECTOMY;  Surgeon: Claud Kelp, MD;  Location: [facility] OR;  Service: General;  Laterality: N/A;   CHOLECYSTECTOMY, LAPAROSCOPIC  10/15/2016   COLONOSCOPY     CORONARY ARTERY BYPASS GRAFT N/A 04/04/2016   Procedure: CORONARY ARTERY BYPASS GRAFTING (CABG) x 4 using bilateral internal mammary arteries and right saphenous vein harvested by endovein;  Surgeon: Alleen Borne, MD;  Location: [facility] OR;  Service: Open Heart Surgery;  Laterality: N/A;   EYE SURGERY Bilateral    cataracts removed./ IOL   PERIPHERAL VASCULAR CATHETERIZATION N/A 03/03/2016   Procedure: Abdominal Aortogram w/Lower Extremity;  Surgeon: Chuck Hint, MD;  Location: [facility] INVASIVE CV LAB;  Service: Cardiovascular;  Laterality: N/A;   REPAIR ILIAC ARTERY Right 07/22/2016   Procedure: RIGHT COMMON ILIAC ANEURYSM REPAIR;  Surgeon: Chuck Hint, MD;  Location: [facility] OR;  Service: Vascular;  Laterality: Right;   SHOULDER ARTHROSCOPY W/ ROTATOR CUFF REPAIR Right    TEE WITHOUT CARDIOVERSION N/A 04/04/2016   Procedure: TRANSESOPHAGEAL ECHOCARDIOGRAM (TEE);  Surgeon: Judie Grieve  Jennefer Bravo, MD;  Location: [facility] OR;  Service: Open Heart Surgery;  Laterality: N/A;   UPPER GASTROINTESTINAL ENDOSCOPY      Current Medications: Current Meds  Medication Sig   acetaminophen (TYLENOL) 325 MG tablet Take 2 tablets (650 mg total) by mouth every 4 (four) hours as needed for mild pain, moderate pain or fever.   albuterol (VENTOLIN HFA) 108 (90 Base) MCG/ACT inhaler INHALE 2 PUFFS INTO THE LUNGS EVERY 6 (SIX) HOURS AS NEEDED FOR SHORTNESS OF BREATH.   aspirin EC 81 MG tablet Take 1 tablet (81 mg total) by mouth daily. Swallow whole.   calcium carbonate (TUMS - DOSED IN MG  ELEMENTAL CALCIUM) 500 MG chewable tablet Chew 5 tablets by mouth as needed.   carvedilol (COREG) 6.25 MG tablet TAKE 1 TABLET BY MOUTH TWICE A DAY   enalapril (VASOTEC) 20 MG tablet Take 20 mg by mouth daily.   hydrochlorothiazide (HYDRODIURIL) 25 MG tablet Take 25 mg by mouth daily.   LORazepam (ATIVAN) 1 MG tablet Take 1 mg by mouth 2 (two) times daily as needed.   Multiple Vitamins-Iron (MULTIVITAMIN/IRON PO) Take 1 tablet by mouth daily.   omeprazole (PRILOSEC) 40 MG capsule Take 40 mg by mouth daily.   rosuvastatin (CRESTOR) 20 MG tablet Take 1 tablet (20 mg total) by mouth daily.   senna (SENOKOT) 8.6 MG TABS tablet Take 1 tablet (8.6 mg total) by mouth 2 (two) times daily.   sertraline (ZOLOFT) 100 MG tablet Take 2 tablets by mouth daily.   traZODone (DESYREL) 100 MG tablet Take 100 mg by mouth at bedtime.   triamcinolone ointment (KENALOG) 0.1 % Apply 1 application topically 3 (three) times daily as needed. As needed for allergic reaction     Allergies:   Codeine and Penicillins   Social History   Socioeconomic History   Marital status: Divorced    Spouse name: Not on file   Number of children: Not on file   Years of education: Not on file   Highest education level: Not on file  Occupational History   Not on file  Tobacco Use   Smoking status: Former    Packs/day: 1.50    Years: 35.00    Pack years: 52.50    Types: Cigarettes    Quit date: 02/15/1996    Years since quitting: 25.4   Smokeless tobacco: Never  Vaping Use   Vaping Use: Never used  Substance and Sexual Activity   Alcohol use: No    Alcohol/week: 0.0 standard drinks   Drug use: No   Sexual activity: Not on file  Other Topics Concern   Not on file  Social History Narrative   Not on file   Social Determinants of Health   Financial Resource Strain: Not on file  Food Insecurity: Not on file  Transportation Needs: Not on file  Physical Activity: Not on file  Stress: Not on file  Social Connections:  Not on file     Family History: The patient's family history includes Cancer in her father; Colon polyps in her brother; Heart disease in her maternal grandfather and maternal uncle; Stomach cancer in her brother and father. There is no history of Colon cancer, Esophageal cancer, or Rectal cancer.    ROS:   Please see the history of present illness.    All other systems reviewed and are negative.   EKGs/Labs/Other Studies Reviewed:    The following studies were reviewed today:  Echo 10/2016 - Left ventricle: The cavity size  was normal. Wall thickness was    increased in a pattern of mild LVH. Systolic function was normal.    The estimated ejection fraction was in the range of 60% to 65%.    There is hypokinesis of the basalinferior myocardium. Doppler    parameters are consistent with abnormal left ventricular    relaxation (grade 1 diastolic dysfunction).  - Aortic valve: There was mild regurgitation.   Impressions:   - Hypokinesis of the basal inferior wall with overall preserved LV    systolic function; grade 1 diastolic dysfunction; sclerotic    aortic valve with mild AI; trace MR and TR.   EKG:  EKG is ordered today.  The ekg ordered today demonstrates NSR  Recent Labs: No results found for requested labs within last 8760 hours.  Recent Lipid Panel    Component Value Date/Time   CHOL 160 07/04/2020 0940   TRIG 193 (H) 07/04/2020 0940   HDL 41 07/04/2020 0940   CHOLHDL 3.9 07/04/2020 0940   CHOLHDL 4.7 09/01/2012 1429   VLDL 60 (H) 09/01/2012 1429   LDLCALC 86 07/04/2020 0940   LDLDIRECT 82 03/23/2009 2152   Physical Exam:    VS:  BP (!) 118/58   Pulse 67   Ht 5' (1.524 m)   Wt 130 lb 3.2 oz (59.1 kg)   SpO2 98%   BMI 25.43 kg/m     Wt Readings from Last 3 Encounters:  07/11/21 130 lb 3.2 oz (59.1 kg)  09/21/20 129 lb 9.6 oz (58.8 kg)  09/04/20 130 lb 3.2 oz (59.1 kg)     GEN: Well nourished, well developed in no acute distress HEENT: Normal NECK: No  JVD; No carotid bruits LYMPHATICS: No lymphadenopathy CARDIAC: RRR, no murmurs, rubs, gallops RESPIRATORY:  Clear to auscultation without rales, wheezing or rhonchi  ABDOMEN: Soft, non-tender, non-distended MUSCULOSKELETAL:  No edema; No deformity  SKIN: Warm and dry NEUROLOGIC:  Alert and oriented x 3 PSYCHIATRIC:  Normal affect   ASSESSMENT AND PLAN:    CAD s/p CABG x 4 in 2017 No angina. Add ASA 81mg  qd. Continue statin and BB.   2. AAA s/p repair - Followed by vascular   3. HTN - BP stable on current medications.   4. HLD - Continue statin. Managed by PCP.   Medication Adjustments/Labs and Tests Ordered: Current medicines are reviewed at length with the patient today.  Concerns regarding medicines are outlined above.  Orders Placed This Encounter  Procedures   EKG 12-Lead    Meds ordered this encounter  Medications   aspirin EC 81 MG tablet    Sig: Take 1 tablet (81 mg total) by mouth daily. Swallow whole.    Dispense:  30 tablet    Refill:  11     Patient Instructions  Medication Instructions:  Your physician recommends that you continue on your current medications.  START Aspirin 81 mg taking 1 daily   *If you need a refill on your cardiac medications before your next appointment, please call your pharmacy*   Lab Work: None ordered  If you have labs (blood work) drawn today and your tests are completely normal, you will receive your results only by: MyChart Message (if you have MyChart) OR A paper copy in the mail If you have any lab test that is abnormal or we need to change your treatment, we will call you to review the results.   Testing/Procedures: None ordered   Follow-Up: [facility], you and your health  needs are our priority.  As part of our continuing mission to provide you with exceptional heart care, we have created designated Provider Care Teams.  These Care Teams include your primary Cardiologist (physician) and Advanced Practice  Providers (APPs -  Physician Assistants and Nurse Practitioners) who all work together to provide you with the care you need, when you need it.  We recommend signing up for the patient portal called "MyChart".  Sign up information is provided on this After Visit Summary.  MyChart is used to connect with patients for Virtual Visits (Telemedicine).  Patients are able to view lab/test results, encounter notes, upcoming appointments, etc.  Non-urgent messages can be sent to your provider as well.   To learn more about what you can do with MyChart, go to [email or web address].    Your next appointment:   12 month(s)  The format for your next appointment:   In Person  Provider:   You may see Kristeen Miss, MD or one of the following Advanced Practice Providers on your designated Care Team:   Tereso Newcomer, PA-C Chelsea Aus, PA-C   Other Instructions    Signed, Manson Passey, [place]  07/11/2021 2:44 PM    [facility]

## 2021-07-11 NOTE — Patient Instructions (Addendum)
Medication Instructions:  Your physician recommends that you continue on your current medications.  START Aspirin 81 mg taking 1 daily   *If you need a refill on your cardiac medications before your next appointment, please call your pharmacy*   Lab Work: None ordered  If you have labs (blood work) drawn today and your tests are completely normal, you will receive your results only by: MyChart Message (if you have MyChart) OR A paper copy in the mail If you have any lab test that is abnormal or we need to change your treatment, we will call you to review the results.   Testing/Procedures: None ordered   Follow-Up: At [facility], you and your health needs are our priority.  As part of our continuing mission to provide you with exceptional heart care, we have created designated Provider Care Teams.  These Care Teams include your primary Cardiologist (physician) and Advanced Practice Providers (APPs -  Physician Assistants and Nurse Practitioners) who all work together to provide you with the care you need, when you need it.  We recommend signing up for the patient portal called "MyChart".  Sign up information is provided on this After Visit Summary.  MyChart is used to connect with patients for Virtual Visits (Telemedicine).  Patients are able to view lab/test results, encounter notes, upcoming appointments, etc.  Non-urgent messages can be sent to your provider as well.   To learn more about what you can do with MyChart, go to [email or web address].    Your next appointment:   12 month(s)  The format for your next appointment:   In Person  Provider:   You may see Kristeen Miss, MD or one of the following Advanced Practice Providers on your designated Care Team:   Tereso Newcomer, PA-C Chelsea Aus, [place]   Other Instructions

## 2021-07-14 ENCOUNTER — Emergency Department (HOSPITAL_COMMUNITY): Payer: Medicare HMO

## 2021-07-14 ENCOUNTER — Inpatient Hospital Stay (HOSPITAL_COMMUNITY)
Admission: EM | Admit: 2021-07-14 | Discharge: 2021-07-16 | DRG: 853 | Disposition: A | Discharge status: Home / Self Care | Payer: Medicare HMO | Attending: Internal Medicine | Admitting: Internal Medicine

## 2021-07-14 ENCOUNTER — Encounter (HOSPITAL_COMMUNITY): Payer: Self-pay

## 2021-07-14 ENCOUNTER — Other Ambulatory Visit: Payer: Self-pay

## 2021-07-14 ENCOUNTER — Inpatient Hospital Stay (HOSPITAL_COMMUNITY): Payer: Medicare HMO

## 2021-07-14 DIAGNOSIS — Z885 Allergy status to narcotic agent status: Secondary | ICD-10-CM

## 2021-07-14 DIAGNOSIS — Z951 Presence of aortocoronary bypass graft: Secondary | ICD-10-CM

## 2021-07-14 DIAGNOSIS — G9341 Metabolic encephalopathy: Secondary | ICD-10-CM | POA: Diagnosis present

## 2021-07-14 DIAGNOSIS — W19XXXA Unspecified fall, initial encounter: Secondary | ICD-10-CM | POA: Diagnosis present

## 2021-07-14 DIAGNOSIS — Z20822 Contact with and (suspected) exposure to covid-19: Secondary | ICD-10-CM | POA: Diagnosis present

## 2021-07-14 DIAGNOSIS — E785 Hyperlipidemia, unspecified: Secondary | ICD-10-CM | POA: Diagnosis present

## 2021-07-14 DIAGNOSIS — Z87891 Personal history of nicotine dependence: Secondary | ICD-10-CM

## 2021-07-14 DIAGNOSIS — Z79899 Other long term (current) drug therapy: Secondary | ICD-10-CM

## 2021-07-14 DIAGNOSIS — M19042 Primary osteoarthritis, left hand: Secondary | ICD-10-CM | POA: Diagnosis present

## 2021-07-14 DIAGNOSIS — R1084 Generalized abdominal pain: Secondary | ICD-10-CM | POA: Diagnosis present

## 2021-07-14 DIAGNOSIS — A419 Sepsis, unspecified organism: Principal | ICD-10-CM | POA: Diagnosis present

## 2021-07-14 DIAGNOSIS — I70209 Unspecified atherosclerosis of native arteries of extremities, unspecified extremity: Secondary | ICD-10-CM | POA: Diagnosis present

## 2021-07-14 DIAGNOSIS — Z419 Encounter for procedure for purposes other than remedying health state, unspecified: Secondary | ICD-10-CM

## 2021-07-14 DIAGNOSIS — R4182 Altered mental status, unspecified: Secondary | ICD-10-CM | POA: Diagnosis present

## 2021-07-14 DIAGNOSIS — K219 Gastro-esophageal reflux disease without esophagitis: Secondary | ICD-10-CM | POA: Diagnosis present

## 2021-07-14 DIAGNOSIS — N136 Pyonephrosis: Secondary | ICD-10-CM | POA: Diagnosis present

## 2021-07-14 DIAGNOSIS — Z9049 Acquired absence of other specified parts of digestive tract: Secondary | ICD-10-CM | POA: Diagnosis not present

## 2021-07-14 DIAGNOSIS — F419 Anxiety disorder, unspecified: Secondary | ICD-10-CM | POA: Diagnosis present

## 2021-07-14 DIAGNOSIS — B964 Proteus (mirabilis) (morganii) as the cause of diseases classified elsewhere: Secondary | ICD-10-CM | POA: Diagnosis present

## 2021-07-14 DIAGNOSIS — Z880 Allergy status to penicillin: Secondary | ICD-10-CM

## 2021-07-14 DIAGNOSIS — I2510 Atherosclerotic heart disease of native coronary artery without angina pectoris: Secondary | ICD-10-CM | POA: Diagnosis present

## 2021-07-14 DIAGNOSIS — N390 Urinary tract infection, site not specified: Secondary | ICD-10-CM | POA: Diagnosis not present

## 2021-07-14 DIAGNOSIS — M19041 Primary osteoarthritis, right hand: Secondary | ICD-10-CM | POA: Diagnosis present

## 2021-07-14 DIAGNOSIS — N12 Tubulo-interstitial nephritis, not specified as acute or chronic: Secondary | ICD-10-CM

## 2021-07-14 DIAGNOSIS — I10 Essential (primary) hypertension: Secondary | ICD-10-CM | POA: Diagnosis present

## 2021-07-14 DIAGNOSIS — Z7982 Long term (current) use of aspirin: Secondary | ICD-10-CM

## 2021-07-14 LAB — URINALYSIS, ROUTINE W REFLEX MICROSCOPIC
Bilirubin Urine: NEGATIVE
Glucose, UA: NEGATIVE mg/dL
Ketones, ur: NEGATIVE mg/dL
Nitrite: NEGATIVE
Protein, ur: 100 mg/dL — AB
Specific Gravity, Urine: 1.014 (ref 1.005–1.030)
WBC, UA: >50 WBC/hpf — ABNORMAL HIGH (ref 0–5)
pH: 6 (ref 5.0–8.0)

## 2021-07-14 LAB — CBC WITH DIFFERENTIAL/PLATELET
Abs Immature Granulocytes: 0.13 10*3/uL — ABNORMAL HIGH (ref 0.00–0.07)
Basophils Absolute: 0 10*3/uL (ref 0.0–0.1)
Basophils Relative: 0 %
Eosinophils Absolute: 0 10*3/uL (ref 0.0–0.5)
Eosinophils Relative: 0 %
HCT: 37.9 % (ref 36.0–46.0)
Hemoglobin: 12.5 g/dL (ref 12.0–15.0)
Immature Granulocytes: 1 %
Lymphocytes Relative: 5 %
Lymphs Abs: 0.8 10*3/uL (ref 0.7–4.0)
MCH: 32.2 pg (ref 26.0–34.0)
MCHC: 33 g/dL (ref 30.0–36.0)
MCV: 97.7 fL (ref 80.0–100.0)
Monocytes Absolute: 0.8 10*3/uL (ref 0.1–1.0)
Monocytes Relative: 6 %
Neutro Abs: 12.6 10*3/uL — ABNORMAL HIGH (ref 1.7–7.7)
Neutrophils Relative %: 88 %
Platelets: 174 10*3/uL (ref 150–400)
RBC: 3.88 MIL/uL (ref 3.87–5.11)
RDW: 13.1 % (ref 11.5–15.5)
WBC: 14.4 10*3/uL — ABNORMAL HIGH (ref 4.0–10.5)
nRBC: 0 % (ref 0.0–0.2)

## 2021-07-14 LAB — COMPREHENSIVE METABOLIC PANEL
ALT: 15 U/L (ref 0–44)
AST: 24 U/L (ref 15–41)
Albumin: 3.6 g/dL (ref 3.5–5.0)
Alkaline Phosphatase: 56 U/L (ref 38–126)
Anion gap: 10 (ref 5–15)
BUN: 35 mg/dL — ABNORMAL HIGH (ref 8–23)
CO2: 25 mmol/L (ref 22–32)
Calcium: 9.1 mg/dL (ref 8.9–10.3)
Chloride: 98 mmol/L (ref 98–111)
Creatinine, Ser: 1.13 mg/dL — ABNORMAL HIGH (ref 0.44–1.00)
GFR, Estimated: 49 mL/min — ABNORMAL LOW (ref >60)
Glucose, Bld: 151 mg/dL — ABNORMAL HIGH (ref 70–99)
Potassium: 4.8 mmol/L (ref 3.5–5.1)
Sodium: 133 mmol/L — ABNORMAL LOW (ref 135–145)
Total Bilirubin: 1.1 mg/dL (ref 0.3–1.2)
Total Protein: 7.2 g/dL (ref 6.5–8.1)

## 2021-07-14 LAB — TSH: TSH: 1.813 u[IU]/mL (ref 0.350–4.500)

## 2021-07-14 LAB — LACTIC ACID, PLASMA: Lactic Acid, Venous: 1.8 mmol/L (ref 0.5–1.9)

## 2021-07-14 LAB — RESP PANEL BY RT-PCR (FLU A&B, COVID) ARPGX2
Influenza A by PCR: NEGATIVE
Influenza B by PCR: NEGATIVE
SARS Coronavirus 2 by RT PCR: NEGATIVE

## 2021-07-14 LAB — TROPONIN I (HIGH SENSITIVITY)
Troponin I (High Sensitivity): 33 ng/L — ABNORMAL HIGH (ref <18)
Troponin I (High Sensitivity): 31 ng/L — ABNORMAL HIGH (ref <18)

## 2021-07-14 LAB — ETHANOL: Alcohol, Ethyl (B): <10 mg/dL (ref <10)

## 2021-07-14 LAB — CK: Total CK: 308 U/L — ABNORMAL HIGH (ref 38–234)

## 2021-07-14 MED ORDER — ACETAMINOPHEN 325 MG PO TABS
650.0000 mg | ORAL_TABLET | Freq: Four times a day (QID) | ORAL | Status: DC | PRN
  Administered 2021-07-14 – 2021-07-16 (×4): 650 mg via ORAL
  Filled 2021-07-14 (×4): qty 2

## 2021-07-14 MED ORDER — SODIUM CHLORIDE 0.9 % IV SOLN: 1.0000 g | INTRAVENOUS | Status: DC

## 2021-07-14 MED ORDER — ROSUVASTATIN CALCIUM 20 MG PO TABS
20.0000 mg | ORAL_TABLET | Freq: Every day | ORAL | Status: DC
  Administered 2021-07-15 – 2021-07-16 (×2): 20 mg via ORAL
  Filled 2021-07-14 (×2): qty 1

## 2021-07-14 MED ORDER — SODIUM CHLORIDE 0.9 % IV SOLN: 1.0000 g | Freq: Once | INTRAVENOUS | Status: DC

## 2021-07-14 MED ORDER — PANTOPRAZOLE SODIUM 40 MG PO TBEC
40.0000 mg | DELAYED_RELEASE_TABLET | Freq: Every day | ORAL | Status: DC
  Administered 2021-07-15 – 2021-07-16 (×2): 40 mg via ORAL
  Filled 2021-07-14 (×2): qty 1

## 2021-07-14 MED ORDER — SENNOSIDES-DOCUSATE SODIUM 8.6-50 MG PO TABS: 1.0000 | ORAL_TABLET | Freq: Every evening | ORAL | Status: DC | PRN

## 2021-07-14 MED ORDER — LACTATED RINGERS IV BOLUS
1000.0000 mL | Freq: Once | INTRAVENOUS | Status: AC
  Administered 2021-07-14: 1000 mL via INTRAVENOUS

## 2021-07-14 MED ORDER — SODIUM CHLORIDE 0.9 % IV BOLUS
500.0000 mL | Freq: Once | INTRAVENOUS | Status: AC
  Administered 2021-07-14: 500 mL via INTRAVENOUS

## 2021-07-14 MED ORDER — ACETAMINOPHEN 325 MG PO TABS
650.0000 mg | ORAL_TABLET | Freq: Four times a day (QID) | ORAL | Status: DC | PRN
Start: 2021-07-14 — End: 2021-07-14
  Administered 2021-07-14: 650 mg via ORAL
  Filled 2021-07-14: qty 2

## 2021-07-14 MED ORDER — ASPIRIN EC 81 MG PO TBEC
81.0000 mg | DELAYED_RELEASE_TABLET | Freq: Every day | ORAL | Status: DC
  Administered 2021-07-15 – 2021-07-16 (×2): 81 mg via ORAL
  Filled 2021-07-14 (×2): qty 1

## 2021-07-14 MED ORDER — SODIUM CHLORIDE 0.9% FLUSH
3.0000 mL | Freq: Two times a day (BID) | INTRAVENOUS | Status: DC
  Administered 2021-07-14 – 2021-07-16 (×2): 3 mL via INTRAVENOUS

## 2021-07-14 MED ORDER — SODIUM CHLORIDE 0.9 % IV SOLN
1.0000 g | Freq: Once | INTRAVENOUS | Status: AC
  Administered 2021-07-14: 1 g via INTRAVENOUS
  Filled 2021-07-14: qty 10

## 2021-07-14 MED ORDER — ENOXAPARIN SODIUM 40 MG/0.4ML IJ SOSY
40.0000 mg | PREFILLED_SYRINGE | INTRAMUSCULAR | Status: DC
  Administered 2021-07-14 – 2021-07-15 (×2): 40 mg via SUBCUTANEOUS
  Filled 2021-07-14 (×2): qty 0.4

## 2021-07-14 MED ORDER — ACETAMINOPHEN 650 MG RE SUPP: 650.0000 mg | Freq: Four times a day (QID) | RECTAL | Status: DC | PRN

## 2021-07-14 MED ORDER — CARVEDILOL 6.25 MG PO TABS
6.2500 mg | ORAL_TABLET | Freq: Two times a day (BID) | ORAL | Status: DC
  Administered 2021-07-14 – 2021-07-16 (×4): 6.25 mg via ORAL
  Filled 2021-07-14: qty 2
  Filled 2021-07-14 (×2): qty 1
  Filled 2021-07-14: qty 2

## 2021-07-14 NOTE — ED Notes (Signed)
Pt became noticeably more confused, attempting to get out of bed to use the restroom. Patient reoriented and reminded she has a purwick. Temperature recheck found that she is febrile again. Tylenol administered.

## 2021-07-14 NOTE — ED Provider Notes (Addendum)
Patient was initially seen by Dr. Jacqulyn Bath.  Please see his note.  Patient presented with confusion.  Laboratory tests were still pending at the time of shift change.  Patient CT scan and chest x-ray were without acute findings.  Patient has subsequently developed a fever.  Her urinalysis also shows large leukocyte esterase, greater than 50 white blood cells and few bacteria suggestive of urinary tract infection as a source of her fever and altered mental status.  IV fluids and antibiotics have been ordered.  We will add on lactic acid level and blood culture.  Remains hemodynamically stable at this time.  We will consult the medical service for admission   Clinical Course as of 07/14/21 2217  Wynelle Link Jul 14, 2021  1555 Notified that pt did spike a fever just now.  Will add on cultures.  Administer fluids [JK]    Clinical Course User Index [JK] Linwood Dibbles, MD    Linwood Dibbles, MD 07/14/21 1609    Linwood Dibbles, MD 07/14/21 2217

## 2021-07-14 NOTE — ED Triage Notes (Signed)
Pt from home with ems. Family last talked to her at 7pm last night, called her around noon today and she would not answer, PD sent for wellness check, pt found on floor in her hallway, lives alone at home. Pt confused, does not remember what happened or how she fell. Pt denies any pain or complaints. Skin tear to left forearm, no other obvious injuries. Pt alert and oriented to self and place. VSS

## 2021-07-14 NOTE — ED Provider Notes (Signed)
Emergency Department Provider Note   I have reviewed the triage vital signs and the nursing notes.   HISTORY  Chief Complaint Fall and Altered Mental Status   HPI Nicole GivensJanice D Dean is a 80 y.o. female with past medical history reviewed below including CAD with prior CABG (2017) and AAA presents to the emergency department after being found on the ground.  Patient lives at home alone.  Family last spoke to her yesterday at 7 PM and cannot get in touch with her later this morning.  They ultimately had to call the police for a wellness check who got into the house and found her lying on the floor.  According to EMS she seemed confused which is not normal for her.  She cannot remember how she got onto the floor and thought it was Saturday.  Patient denies any specific pain to me.  She cannot recall any of the events of the last 12 to 24 hours.   Level 5 caveat: AMS   In speaking with the patient's daughter by phone she tells me that she is typically alert with only mild forgetfulness.  She spoke with her yesterday and states that she sounded congested with some coughing and had been complaining of some vomiting.  She states symptoms were fairly mild at scene but she did not think much of it at the time.   Past Medical History:  Diagnosis Date   AAA (abdominal aortic aneurysm) (HCC)    VVS- following    Allergy    Anemia    Anxiety    Arthritis    hands   Blood transfusion without reported diagnosis    Cataract    removed bilateral    Cholecystitis 10/2016   Coronary artery disease    Depression    Gastric ulcer 05/27/2017   Dr Adela LankArmbruster   GERD (gastroesophageal reflux disease)    History of bronchitis    History of pneumonia    Hyperlipidemia    Hypertension    Numbness and tingling of right leg    Shortness of breath dyspnea    sometimes even with resting; pt. denies since heart surgery   Wears glasses     Patient Active Problem List   Diagnosis Date Noted   Acute  metabolic encephalopathy 07/15/2021   Sepsis secondary to UTI (HCC) 07/14/2021   Acute gangrenous cholecystitis 10/15/2016   Protein-calorie malnutrition, severe 10/15/2016   Acute cholecystitis 10/14/2016   Generalized abdominal pain    Status post AAA (abdominal aortic aneurysm) repair    Depression    Anxiety state    SOB (shortness of breath)    Atherosclerosis of native artery of both lower extremities with intermittent claudication (HCC)    Acute blood loss anemia    Hypotension    Tachypnea    Leukocytosis    Hyponatremia    Abdominal aortic aneurysm without rupture (HCC) 07/22/2016   CAD (coronary artery disease) 06/04/2016   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   Abnormal nuclear stress test 03/31/2016   AAA (abdominal aortic aneurysm) (HCC) 03/25/2016   Atherosclerotic peripheral vascular disease (HCC) 03/03/2016   Depression with anxiety 08/09/2012    Past Surgical History:  Procedure Laterality Date   ABDOMINAL AORTIC ANEURYSM REPAIR N/A 07/22/2016   Procedure: JUXTARENAL  ABDOMINAL AORTIC ANEURYSM REPAIR;  Surgeon: Chuck Hinthristopher S Dickson, MD;  [facility];  Service: Vascular;  Laterality: N/A;   AORTA - BILATERAL FEMORAL ARTERY BYPASS GRAFT N/A 07/22/2016   Procedure: AORTA BIFEMORAL  BYPASS GRAFT;  Surgeon: Chuck Hint, MD;  Location[facility];  Service: Vascular;  Laterality: N/A;   CABG x 4     CARDIAC CATHETERIZATION N/A 03/31/2016   Procedure: Left Heart Cath and Coronary Angiography;  Surgeon: Peter M Swaziland, MD;  Location: [facility] INVASIVE CV LAB;  Service: Cardiovascular;  Laterality: N/A;   CHOLECYSTECTOMY N/A 10/15/2016   Procedure: LAPAROSCOPIC CONVERTED TO OPEN CHOLECYSTECTOMY;  Surgeon: Claud Kelp, MD;  Location: [facility] OR;  Service: General;  Laterality: N/A;   CHOLECYSTECTOMY, LAPAROSCOPIC  10/15/2016   COLONOSCOPY     CORONARY ARTERY BYPASS GRAFT N/A 04/04/2016   Procedure: CORONARY ARTERY BYPASS GRAFTING (CABG) x 4 using bilateral internal mammary arteries and  right saphenous vein harvested by endovein;  Surgeon: Alleen Borne, MD;  Location: [facility] OR;  Service: Open Heart Surgery;  Laterality: N/A;   EYE SURGERY Bilateral    cataracts removed./ IOL   PERIPHERAL VASCULAR CATHETERIZATION N/A 03/03/2016   Procedure: Abdominal Aortogram w/Lower Extremity;  Surgeon: Chuck Hint, MD;  Location: [facility] INVASIVE CV LAB;  Service: Cardiovascular;  Laterality: N/A;   REPAIR ILIAC ARTERY Right 07/22/2016   Procedure: RIGHT COMMON ILIAC ANEURYSM REPAIR;  Surgeon: Chuck Hint, MD;  Location: [facility] OR;  Service: Vascular;  Laterality: Right;   SHOULDER ARTHROSCOPY W/ ROTATOR CUFF REPAIR Right    TEE WITHOUT CARDIOVERSION N/A 04/04/2016   Procedure: TRANSESOPHAGEAL ECHOCARDIOGRAM (TEE);  Surgeon: Alleen Borne, MD;  Location: [facility] OR;  Service: Open Heart Surgery;  Laterality: N/A;   UPPER GASTROINTESTINAL ENDOSCOPY      Allergies Codeine and Penicillins  Family History  Problem Relation Age of Onset   Cancer Father        stomach   Stomach cancer Father    Heart disease Maternal Grandfather    Stomach cancer Brother    Heart disease Maternal Uncle    Colon polyps Brother    Colon cancer Neg Hx    Esophageal cancer Neg Hx    Rectal cancer Neg Hx     Social History Social History   Tobacco Use   Smoking status: Former    Packs/day: 1.50    Years: 35.00    Pack years: 52.50    Types: Cigarettes    Quit date: 02/15/1996    Years since quitting: 25.4   Smokeless tobacco: Never  Vaping Use   Vaping Use: Never used  Substance Use Topics   Alcohol use: No    Alcohol/week: 0.0 standard drinks   Drug use: No    Review of Systems  Constitutional: No fever/chills Eyes: No visual changes. ENT: No sore throat. Cardiovascular: Denies chest pain. Respiratory: Denies shortness of breath. Gastrointestinal: No abdominal pain.  No nausea, no vomiting.  No diarrhea.  No constipation. Genitourinary: Negative for dysuria. Musculoskeletal:  Negative for back pain. Skin: Negative for rash. Neurological: Negative for headaches, focal weakness or numbness.  10-point ROS otherwise negative.  ____________________________________________   PHYSICAL EXAM:  VITAL SIGNS: Vitals:   07/15/21 1401 07/15/21 1421  BP:  (!) 151/67  Pulse: 78 80  Resp: (!) 24 (!) 27  Temp:    SpO2: 100% 99%    Constitutional: Alert but oriented to person and place. Confused regarding time and recent events. No acute distress.  Eyes: Conjunctivae are normal. PERRL.  Head: Atraumatic. Nose: No congestion/rhinnorhea. Mouth/Throat: Mucous membranes are slightly dry.  Neck: No stridor. No cervical spine tenderness to palpation. Cardiovascular: Normal rate, regular rhythm. Good peripheral circulation. Grossly normal  heart sounds.   Respiratory: Normal respiratory effort.  No retractions. Lungs CTAB. Gastrointestinal: Soft and nontender. No distention.  Musculoskeletal: No lower extremity tenderness nor edema. No gross deformities of extremities. Normal passive ROM to the bilateral upper and lower extremities.  Neurologic:  Normal speech and language. No gross focal neurologic deficits are appreciated.  Skin:  Skin is warm, dry and intact. Slight erythema to the right lateral hip and right lateral knee. No blistering or bruising.    ____________________________________________   LABS (all labs ordered are listed, but only abnormal results are displayed)  Labs Reviewed  URINE CULTURE - Abnormal; Notable for the following components:      Result Value   Culture   (*)    Value: >=100,000 COLONIES/mL PROTEUS MIRABILIS SUSCEPTIBILITIES TO FOLLOW Performed at [facility], [address] [place], [place] [address]    All other components within normal limits  COMPREHENSIVE METABOLIC PANEL - Abnormal; Notable for the following components:   Sodium 133 (*)    Glucose, Bld 151 (*)    BUN 35 (*)    Creatinine, Ser 1.13 (*)    GFR, Estimated  49 (*)    All other components within normal limits  CBC WITH DIFFERENTIAL/PLATELET - Abnormal; Notable for the following components:   WBC 14.4 (*)    Neutro Abs 12.6 (*)    Abs Immature Granulocytes 0.13 (*)    All other components within normal limits  CK - Abnormal; Notable for the following components:   Total CK 308 (*)    All other components within normal limits  URINALYSIS, ROUTINE W REFLEX MICROSCOPIC - Abnormal; Notable for the following components:   APPearance CLOUDY (*)    Hgb urine dipstick MODERATE (*)    Protein, ur 100 (*)    Leukocytes,Ua LARGE (*)    WBC, UA >50 (*)    Bacteria, UA FEW (*)    All other components within normal limits  BASIC METABOLIC PANEL - Abnormal; Notable for the following components:   Sodium 134 (*)    Glucose, Bld 129 (*)    BUN 30 (*)    Calcium 8.6 (*)    All other components within normal limits  CBC - Abnormal; Notable for the following components:   RBC 3.32 (*)    Hemoglobin 10.9 (*)    HCT 32.3 (*)    Platelets 149 (*)    All other components within normal limits  PROTIME-INR - Abnormal; Notable for the following components:   Prothrombin Time 15.6 (*)    All other components within normal limits  APTT - Abnormal; Notable for the following components:   aPTT 38 (*)    All other components within normal limits  TROPONIN I (HIGH SENSITIVITY) - Abnormal; Notable for the following components:   Troponin I (High Sensitivity) 33 (*)    All other components within normal limits  TROPONIN I (HIGH SENSITIVITY) - Abnormal; Notable for the following components:   Troponin I (High Sensitivity) 31 (*)    All other components within normal limits  RESP PANEL BY RT-PCR (FLU A&B, COVID) ARPGX2  CULTURE, BLOOD (ROUTINE X 2)  CULTURE, BLOOD (ROUTINE X 2)  ETHANOL  TSH  LACTIC ACID, PLASMA   ____________________________________________  EKG   EKG Interpretation  Date/Time:  Sunday July 14 2021 13:52:43 EDT Ventricular Rate:   92 PR Interval:  159 QRS Duration: 103 QT Interval:  325 QTC Calculation: 402 R Axis:   76 Text Interpretation: Sinus  rhythm Borderline T abnormalities, anterior leads Confirmed by Alona Bene [phone]) on 07/14/2021 1:56:46 PM Also confirmed by Alona Bene [phone]), editor Jillene Bucks [phone])  on 07/15/2021 9:10:49 AM        ____________________________________________  RADIOLOGY  MR BRAIN WO CONTRAST  Result Date: 07/14/2021 CLINICAL DATA:  Neuro deficit, acute, stroke suspected. Confusion. Found on the ground. EXAM: MRI HEAD WITHOUT CONTRAST TECHNIQUE: Multiplanar, multiecho pulse sequences of the brain and surrounding structures were obtained without intravenous contrast. COMPARISON:  Head CT earlier same day.  MRI 10/16/2016. FINDINGS: Brain: Diffusion imaging does not show any acute or subacute infarction. No abnormality affects the brainstem. 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 Minimal small vessel change of the cerebral hemispheric white matter. No cortical or large vessel territory infarction. No mass lesion, hemorrhage, hydrocephalus or extra-axial collection. Vascular: Major vessels at the base of the brain show flow. Skull and upper cervical spine: Negative Sinuses/Orbits: Clear/normal Other: None IMPRESSION: No acute finding. 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 Minimal small vessel ischemic change of the cerebral hemispheric white matter. Electronically Signed   By: Paulina Fusi M.D.   On: 07/14/2021 18:36   CT ABDOMEN PELVIS W CONTRAST  Result Date: 07/15/2021 CLINICAL DATA:  Acute right lower quadrant abdominal pain. EXAM: CT ABDOMEN AND PELVIS WITH CONTRAST TECHNIQUE: Multidetector CT imaging of the abdomen and pelvis was performed using the standard protocol following bolus administration of intravenous contrast. CONTRAST:  10mL OMNIPAQUE IOHEXOL 350 MG/ML SOLN COMPARISON:  October 13, 2016. FINDINGS: Lower chest:  Minimal right pleural effusion is noted with minimal adjacent subsegmental atelectasis. Hepatobiliary: No focal liver abnormality is seen. Status post cholecystectomy. No biliary dilatation. Pancreas: Unremarkable. No pancreatic ductal dilatation or surrounding inflammatory changes. Spleen: Normal in size without focal abnormality. Adrenals/Urinary Tract: Adrenal glands appear normal. Nonobstructive right renal calculus is noted. Bilateral renal cysts are noted. Mild right hydronephrosis is noted secondary to 6 mm calculus in proximal right ureter. Urinary bladder is unremarkable. Stomach/Bowel: Stomach is within normal limits. Appendix appears normal. No evidence of bowel wall thickening, distention, or inflammatory changes. Vascular/Lymphatic: Status post aortobifemoral bypass graft placement. No significant adenopathy is noted. Reproductive: Uterus and bilateral adnexa are unremarkable. Other: Small fat containing left inguinal hernia is noted. No ascites is noted. Musculoskeletal: No acute or significant osseous findings. IMPRESSION: Mild right hydronephrosis is noted secondary to 6 mm calculus in proximal right ureter. Nonobstructive right renal calculus is noted. Minimal right pleural effusion is noted with adjacent subsegmental atelectasis. Status post aortofemoral bypass graft placement. Aortic Atherosclerosis (ICD10-I70.0). Electronically Signed   By: Lupita Raider M.D.   On: 07/15/2021 13:19    ____________________________________________   PROCEDURES  Procedure(s) performed:   Procedures  None  ____________________________________________   INITIAL IMPRESSION / ASSESSMENT AND PLAN / ED COURSE  Pertinent labs & imaging results that were available during my care of the patient were reviewed by me and considered in my medical decision making (see chart for details).   Patient presents to the emergency department with her altered mental status.  She was found on the ground this morning at  home but unsure exactly how [place] she had been there or what events transpired to lead her to fall.  She has some pressure appearing redness to the right lateral hip and right knee but no focal tenderness here.  Normal range of motion of these joints.  Minor skin tear to the left forearm without laceration.  No midline spine tenderness.  In discussion with family patient seems confused compared to her baseline mental status.  Plan for broad work-up including CT imaging of the head along with chest x-ray with report from family of congestion and cough.  Will need to rule out infectious etiology such as pneumonia or urinary tract infection, metabolic encephalopathy, head injury/bleeding.   Care transferred to Dr. Lynelle Doctor pending remaining workup.  ____________________________________________  FINAL CLINICAL IMPRESSION(S) / ED DIAGNOSES  Final diagnoses:  Pyelonephritis     MEDICATIONS GIVEN DURING THIS VISIT:  Medications  enoxaparin (LOVENOX) injection 40 mg ( Subcutaneous Automatically Held 07/30/21 2100)  sodium chloride flush (NS) 0.9 % injection 3 mL ( Intravenous Automatically Held 07/30/21 2200)  acetaminophen (TYLENOL) tablet 650 mg ( Oral MAR Hold 07/15/21 1542)    Or  acetaminophen (TYLENOL) suppository 650 mg ( Rectal MAR Hold 07/15/21 1542)  senna-docusate (Senokot-S) tablet 1 tablet ( Oral MAR Hold 07/15/21 1542)  aspirin EC tablet 81 mg ( Oral Automatically Held 07/23/21 1000)  carvedilol (COREG) tablet 6.25 mg ( Oral Automatically Held 07/30/21 2200)  rosuvastatin (CRESTOR) tablet 20 mg ( Oral Automatically Held 07/23/21 1000)  pantoprazole (PROTONIX) EC tablet 40 mg ( Oral Automatically Held 07/23/21 1000)  0.9 %  sodium chloride infusion ( Intravenous MAR Hold 07/15/21 1542)  lactated ringers infusion ( Intravenous New Bag/Given 07/15/21 1026)  cefTRIAXone (ROCEPHIN) 1 g in sodium chloride 0.9 % 100 mL IVPB ( Intravenous Automatically Held 07/23/21 2100)  chlorhexidine (PERIDEX) 0.12 %  solution (has no administration in time range)  sodium chloride 0.9 % bolus 500 mL (0 mLs Intravenous Stopped 07/14/21 1531)  cefTRIAXone (ROCEPHIN) 1 g in sodium chloride 0.9 % 100 mL IVPB (0 g Intravenous Stopped 07/14/21 1717)  lactated ringers bolus 1,000 mL (0 mLs Intravenous Stopped 07/14/21 1932)  iohexol (OMNIPAQUE) 350 MG/ML injection 50 mL (50 mLs Intravenous Contrast Given 07/15/21 1259)      Note:  This document was prepared using Dragon voice recognition software and may include unintentional dictation errors.  Alona Bene, MD, [facility] Emergency Medicine    Oliviya Gilkison, Arlyss Repress, MD 07/15/21 1550

## 2021-07-14 NOTE — H&P (Addendum)
Date: 07/14/2021               Patient Name:  Nicole Dean MRN: [id number]  DOB: 01/03/1941 Age / Sex: 80 y.o., female   PCP: Omer Jack         Medical Service: Internal Medicine Teaching Service         Attending Physician: Dr. Erlinda Hong    First Contact: Dr. Ned Card Pager: [phone]  Second Contact: Dr. Ephriam Knuckles Pager: [phone]       After Hours (After 5p/  First Contact Pager: [phone]  weekends / holidays): Second Contact Pager: [phone]   Chief Complaint: altered mental status  History of Present Illness: Nicole Dean is a 80 yo female with HTN, HLD, CAD s/p CABG in 2017, and AAA s/p repair in 2017, who presented with confusion after being found on the ground in her home. History provided by patient's son, Casimiro Needle, who is at bedside.  He reports that around 7:30-8pm Saturday night, he spoke with the patient who was endorsing some nausea and vomiting. He advised her to rest. On Sunday morning, he received a call from a friend who told him they tried to bring her food, but no one answered the door after repeated attempts. He was able to get to her by 12:00pm this afternoon and had to break down the door, to which he found the patient down on the ground in her bedroom. At her baseline, she is alert and independent of her ADLs. It remains unclear if she had any head trauma, as she was just found lying between the side of the bed and the chest of drawers. She was confused when EMS arrived and was not aware that she was on the floor. Casimiro Needle notes that there was small amount of blood on the corner of the bed sheets, unsure if this came from some trauma. She was also found to be covered in her urine. Currently, on exam, she continues to be significantly confused. She is unable to complete her sentences and has had word finding difficulty. She is able to speak clearly when she does get her words out, but her words seem to taper off. The patient is currently aware of her  name and birth date, however, she does not know where she is or what year we are in. Casimiro Needle also notes that she has been seeing/hearing rats in the room and she has never had hallucinations before. She has no prior history of falls and no history of seizures on syncope.    In the ED: Fever of 101.2, WBC 14.4, trop 33, ck 308. UA showing large leukocyte esterase and bacteria. CXR: negative for any acute process. CT head negative for any acute intracranial abnormalities. Received a dose of rocephin for UTI. Overall, the patient remained hemodynamically stable in the ED  Meds:  Trazodone 100mg  and ativan 1mg  nightly per son Zoloft 100mg   Carvedilol 6.25mg  Enalapril 20mg  Aspirin 81mg   Rosuvastatin 20mg  HCTZ 25mg  No outpatient medications have been marked as taking for the 07/14/21 encounter [facility] Encounter).   Allergies: Allergies as of 07/14/2021 - Review Complete 07/14/2021  Allergen Reaction Noted   Codeine Nausea And Vomiting 01/17/2014   Penicillins Nausea Only and Other (See Comments) 03/18/2011   Past Medical History:  Diagnosis Date   AAA (abdominal aortic aneurysm) (HCC)    VVS- following    Allergy    Anemia    Anxiety    Arthritis    hands  Blood transfusion without reported diagnosis    Cataract    removed bilateral    Cholecystitis 10/2016   Coronary artery disease    Depression    Gastric ulcer 05/27/2017   Dr Adela Lank   GERD (gastroesophageal reflux disease)    History of bronchitis    History of pneumonia    Hyperlipidemia    Hypertension    Numbness and tingling of right leg    Shortness of breath dyspnea    sometimes even with resting; pt. denies since heart surgery   Wears glasses     Family History:  No history of heart disease, diabetes, cancer  Social History:  Patient lives alone in [place] and is independent in her ADLs. She is able to manage her medications. 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 Denies any alcohol use.  Feeds the ducks every day.   Review of Systems: A complete ROS was negative except as per HPI.   Physical Exam: Blood pressure (!) 158/62, pulse 97, temperature (!) 101.2 F (38.4 C), temperature source Oral, resp. rate (!) 26, SpO2 94 %.  General: Pleasant, elderly appearing female laying in bed. No acute distress. Head: Normocephalic. Atraumatic. CV: RRR. No murmurs, rubs, or gallops. No LE edema Pulmonary: Lungs CTAB. Normal effort. No wheezing or rales. Abdominal: Soft, nontender, nondistended. Normal bowel sounds. Extremities: Palpable radial and DP pulses. Normal ROM. Normal passive ROM Skin: Warm and dry. No obvious rash or lesions. Neuro: A&O to self. Word finding difficulty. Normal sensation.  Psych: Normal mood and affect   EKG: personally reviewed my interpretation is sinus rhythm 92bpm  CXR: personally reviewed my interpretation is No focal airspace consolidation, pleural effusion, or pneumothorax.  CT HEAD WO CONTRAST:  IMPRESSION: 1. No acute intracranial abnormalities. No cause for the patient's symptoms identified.  Assessment & Plan by Problem: Active Problems:   * No active hospital problems. *  #Altered mental status #Urinary tract infection Patient found down on the ground for an unknown period of time early this afternoon. Was found to be confused and unaware she was on the ground. No signs of trauma were noted, but the patient was found covered in her urine. No history of seizures, syncope, or strokes ever. Patient is currently alert and oriented to self only. In the ED, UA was positive for large leukocyte esterase and bacteria and she received one dose of Rocephin. CT head negative for intracranial hemorrhage. - MRI brain pending to r/o cerebral vascular accident - PT-INR, PTT, and lactic acid pending - Blood and urine cultures pending - Continue Rocephin 1g daily - PT/OT/SLP eval and  treat - Delirium precautions   #Coronary artery disease s/p CABG x4 Patient underwent CABG in 2017, no complications noted. Patient is not having any chest pain, palpitations, or shortness of breath. She takes aspirin, rosuvastatin, and carvedilol at home.  - Continue home meds - Cardiac monitoring   Best practice: Diet: NPO, except sips with meds pending SLP eval IVF: None, receievd 1.5 L bolus in Ed VTE prophylaxis: Lovenox 40mg  Therapy: PT/OT eval pending Code: Full   Dispo: Admit patient to Inpatient with expected length of stay greater than 2 midnights.  Signed , DO 07/14/2021, 4:48 PM  Pager: @MYPAGER @ After 5pm on weekdays and 1pm on weekends: On Call pager: [phone]

## 2021-07-14 NOTE — ED Notes (Signed)
Patient transported to MRI 

## 2021-07-15 ENCOUNTER — Inpatient Hospital Stay (HOSPITAL_COMMUNITY): Payer: Medicare HMO | Admitting: Anesthesiology

## 2021-07-15 ENCOUNTER — Inpatient Hospital Stay (HOSPITAL_COMMUNITY): Payer: Medicare HMO

## 2021-07-15 ENCOUNTER — Encounter (HOSPITAL_COMMUNITY): Payer: Self-pay | Admitting: Student in an Organized Health Care Education/Training Program

## 2021-07-15 ENCOUNTER — Encounter (HOSPITAL_COMMUNITY)
Admission: EM | Disposition: A | Discharge status: Home / Self Care | Payer: Self-pay | Source: Home / Self Care | Attending: Student in an Organized Health Care Education/Training Program

## 2021-07-15 DIAGNOSIS — G9341 Metabolic encephalopathy: Secondary | ICD-10-CM | POA: Diagnosis present

## 2021-07-15 HISTORY — PX: CYSTOSCOPY W/ URETERAL STENT PLACEMENT: SHX1429

## 2021-07-15 LAB — CBC
HCT: 32.3 % — ABNORMAL LOW (ref 36.0–46.0)
Hemoglobin: 10.9 g/dL — ABNORMAL LOW (ref 12.0–15.0)
MCH: 32.8 pg (ref 26.0–34.0)
MCHC: 33.7 g/dL (ref 30.0–36.0)
MCV: 97.3 fL (ref 80.0–100.0)
Platelets: 149 10*3/uL — ABNORMAL LOW (ref 150–400)
RBC: 3.32 MIL/uL — ABNORMAL LOW (ref 3.87–5.11)
RDW: 13.2 % (ref 11.5–15.5)
WBC: 9.4 10*3/uL (ref 4.0–10.5)
nRBC: 0 % (ref 0.0–0.2)

## 2021-07-15 LAB — BASIC METABOLIC PANEL
Anion gap: 8 (ref 5–15)
BUN: 30 mg/dL — ABNORMAL HIGH (ref 8–23)
CO2: 25 mmol/L (ref 22–32)
Calcium: 8.6 mg/dL — ABNORMAL LOW (ref 8.9–10.3)
Chloride: 101 mmol/L (ref 98–111)
Creatinine, Ser: 0.94 mg/dL (ref 0.44–1.00)
GFR, Estimated: >60 mL/min (ref >60)
Glucose, Bld: 129 mg/dL — ABNORMAL HIGH (ref 70–99)
Potassium: 4.2 mmol/L (ref 3.5–5.1)
Sodium: 134 mmol/L — ABNORMAL LOW (ref 135–145)

## 2021-07-15 LAB — PROTIME-INR
INR: 1.2 (ref 0.8–1.2)
Prothrombin Time: 15.6 seconds — ABNORMAL HIGH (ref 11.4–15.2)

## 2021-07-15 LAB — APTT: aPTT: 38 seconds — ABNORMAL HIGH (ref 24–36)

## 2021-07-15 SURGERY — CYSTOSCOPY, WITH RETROGRADE PYELOGRAM AND URETERAL STENT INSERTION
Anesthesia: General | Laterality: Right

## 2021-07-15 MED ORDER — FENTANYL CITRATE (PF) 100 MCG/2ML IJ SOLN: 25.0000 ug | INTRAMUSCULAR | Status: DC | PRN

## 2021-07-15 MED ORDER — LACTATED RINGERS IV SOLN: INTRAVENOUS | Status: AC

## 2021-07-15 MED ORDER — SODIUM CHLORIDE 0.9 % IV SOLN
1.0000 g | INTRAVENOUS | Status: DC
  Administered 2021-07-15: 1 g via INTRAVENOUS
  Filled 2021-07-15 (×2): qty 10

## 2021-07-15 MED ORDER — ONDANSETRON HCL 4 MG/2ML IJ SOLN
INTRAMUSCULAR | Status: DC | PRN
  Administered 2021-07-15: 4 mg via INTRAVENOUS

## 2021-07-15 MED ORDER — METRONIDAZOLE 500 MG/100ML IV SOLN: 500.0000 mg | Freq: Three times a day (TID) | INTRAVENOUS | Status: DC

## 2021-07-15 MED ORDER — WATER FOR IRRIGATION, STERILE IR SOLN
Status: DC | PRN
  Administered 2021-07-15: 3000 mL

## 2021-07-15 MED ORDER — EPHEDRINE SULFATE 50 MG/ML IJ SOLN
INTRAMUSCULAR | Status: DC | PRN
  Administered 2021-07-15: 10 mg via INTRAVENOUS

## 2021-07-15 MED ORDER — FENTANYL CITRATE (PF) 250 MCG/5ML IJ SOLN
INTRAMUSCULAR | Status: DC | PRN
  Administered 2021-07-15: 150 ug via INTRAVENOUS

## 2021-07-15 MED ORDER — PROPOFOL 10 MG/ML IV BOLUS
INTRAVENOUS | Status: AC
  Filled 2021-07-15: qty 20

## 2021-07-15 MED ORDER — DEXAMETHASONE SODIUM PHOSPHATE 10 MG/ML IJ SOLN
INTRAMUSCULAR | Status: DC | PRN
  Administered 2021-07-15: 5 mg via INTRAVENOUS

## 2021-07-15 MED ORDER — PHENYLEPHRINE HCL (PRESSORS) 10 MG/ML IV SOLN
INTRAVENOUS | Status: DC | PRN
  Administered 2021-07-15 (×2): 120 ug via INTRAVENOUS

## 2021-07-15 MED ORDER — CHLORHEXIDINE GLUCONATE CLOTH 2 % EX PADS
6.0000 | MEDICATED_PAD | Freq: Every day | CUTANEOUS | Status: DC
  Administered 2021-07-15 – 2021-07-16 (×2): 6 via TOPICAL

## 2021-07-15 MED ORDER — AMISULPRIDE (ANTIEMETIC) 5 MG/2ML IV SOLN: 10.0000 mg | Freq: Once | INTRAVENOUS | Status: DC | PRN

## 2021-07-15 MED ORDER — ONDANSETRON HCL 4 MG/2ML IJ SOLN: 4.0000 mg | Freq: Once | INTRAMUSCULAR | Status: DC | PRN

## 2021-07-15 MED ORDER — FENTANYL CITRATE (PF) 250 MCG/5ML IJ SOLN
INTRAMUSCULAR | Status: AC
  Filled 2021-07-15: qty 5

## 2021-07-15 MED ORDER — DEXAMETHASONE SODIUM PHOSPHATE 10 MG/ML IJ SOLN
INTRAMUSCULAR | Status: AC
  Filled 2021-07-15: qty 1

## 2021-07-15 MED ORDER — FENTANYL CITRATE (PF) 100 MCG/2ML IJ SOLN
INTRAMUSCULAR | Status: DC | PRN
  Administered 2021-07-15: 125 ug via INTRAVENOUS
  Administered 2021-07-15: 25 ug via INTRAVENOUS

## 2021-07-15 MED ORDER — IOHEXOL 350 MG/ML SOLN
50.0000 mL | Freq: Once | INTRAVENOUS | Status: AC | PRN
  Administered 2021-07-15: 50 mL via INTRAVENOUS

## 2021-07-15 MED ORDER — LIDOCAINE HCL (CARDIAC) PF 100 MG/5ML IV SOSY
PREFILLED_SYRINGE | INTRAVENOUS | Status: DC | PRN
  Administered 2021-07-15: 60 mg via INTRAVENOUS

## 2021-07-15 MED ORDER — SODIUM CHLORIDE 0.9 % IV SOLN: INTRAVENOUS | Status: DC | PRN

## 2021-07-15 MED ORDER — LIDOCAINE 2% (20 MG/ML) 5 ML SYRINGE
INTRAMUSCULAR | Status: AC
  Filled 2021-07-15: qty 5

## 2021-07-15 MED ORDER — VANCOMYCIN HCL 500 MG/100ML IV SOLN
500.0000 mg | INTRAVENOUS | Status: DC
  Administered 2021-07-15: 500 mg via INTRAVENOUS
  Filled 2021-07-15: qty 100

## 2021-07-15 MED ORDER — LIDOCAINE HCL URETHRAL/MUCOSAL 2 % EX GEL
CUTANEOUS | Status: AC
  Filled 2021-07-15: qty 11

## 2021-07-15 MED ORDER — PHENYLEPHRINE 40 MCG/ML (10ML) SYRINGE FOR IV PUSH (FOR BLOOD PRESSURE SUPPORT)
PREFILLED_SYRINGE | INTRAVENOUS | Status: AC
  Filled 2021-07-15: qty 30

## 2021-07-15 MED ORDER — TRAZODONE HCL 100 MG PO TABS
100.0000 mg | ORAL_TABLET | Freq: Every day | ORAL | Status: DC
  Administered 2021-07-15: 100 mg via ORAL
  Filled 2021-07-15: qty 1

## 2021-07-15 MED ORDER — IOHEXOL 300 MG/ML  SOLN
INTRAMUSCULAR | Status: DC | PRN
  Administered 2021-07-15: 6 mL

## 2021-07-15 MED ORDER — PROPOFOL 10 MG/ML IV BOLUS
INTRAVENOUS | Status: DC | PRN
  Administered 2021-07-15: 200 mg via INTRAVENOUS

## 2021-07-15 MED ORDER — SUCCINYLCHOLINE CHLORIDE 200 MG/10ML IV SOSY
PREFILLED_SYRINGE | INTRAVENOUS | Status: DC | PRN
Start: 2021-07-15 — End: 2021-07-15
  Administered 2021-07-15: 60 mg via INTRAVENOUS

## 2021-07-15 MED ORDER — CHLORHEXIDINE GLUCONATE 0.12 % MT SOLN
OROMUCOSAL | Status: AC
  Filled 2021-07-15: qty 15

## 2021-07-15 MED ORDER — SODIUM CHLORIDE 0.9 % IV SOLN
2.0000 g | Freq: Two times a day (BID) | INTRAVENOUS | Status: DC
  Administered 2021-07-15: 2 g via INTRAVENOUS
  Filled 2021-07-15: qty 2

## 2021-07-15 MED ORDER — PHENYLEPHRINE HCL-NACL 20-0.9 MG/250ML-% IV SOLN
INTRAVENOUS | Status: DC | PRN
  Administered 2021-07-15: 50 ug/min via INTRAVENOUS

## 2021-07-15 MED ORDER — SUCCINYLCHOLINE CHLORIDE 200 MG/10ML IV SOSY
PREFILLED_SYRINGE | INTRAVENOUS | Status: AC
  Filled 2021-07-15: qty 10

## 2021-07-15 MED ORDER — ONDANSETRON HCL 4 MG/2ML IJ SOLN
INTRAMUSCULAR | Status: AC
  Filled 2021-07-15: qty 2

## 2021-07-15 SURGICAL SUPPLY — 22 items
BAG URINE DRAIN 2000ML AR STRL (UROLOGICAL SUPPLIES) ×2 IMPLANT
BAG URO CATCHER STRL LF (MISCELLANEOUS) ×2 IMPLANT
CATH FOLEY 2WAY SLVR  5CC 16FR (CATHETERS) ×2
CATH FOLEY 2WAY SLVR 5CC 16FR (CATHETERS) ×1 IMPLANT
CATH URET 5FR 28IN OPEN ENDED (CATHETERS) ×2 IMPLANT
GLOVE SURG ENC MOIS LTX SZ7.5 (GLOVE) ×2 IMPLANT
GOWN STRL REUS W/ TWL LRG LVL3 (GOWN DISPOSABLE) ×1 IMPLANT
GOWN STRL REUS W/ TWL XL LVL3 (GOWN DISPOSABLE) ×1 IMPLANT
GOWN STRL REUS W/TWL LRG LVL3 (GOWN DISPOSABLE) ×2
GOWN STRL REUS W/TWL XL LVL3 (GOWN DISPOSABLE) ×2
GUIDEWIRE ANG ZIPWIRE 038X150 (WIRE) ×2 IMPLANT
GUIDEWIRE STR DUAL SENSOR (WIRE) IMPLANT
KIT TURNOVER KIT B (KITS) ×2 IMPLANT
MANIFOLD NEPTUNE II (INSTRUMENTS) IMPLANT
NS IRRIG 1000ML POUR BTL (IV SOLUTION) IMPLANT
PACK CYSTO (CUSTOM PROCEDURE TRAY) ×2 IMPLANT
STENT URET 6FRX24 CONTOUR (STENTS) ×2 IMPLANT
STENT URET 6FRX26 CONTOUR (STENTS) IMPLANT
SYPHON OMNI JUG (MISCELLANEOUS) ×2 IMPLANT
TOWEL GREEN STERILE FF (TOWEL DISPOSABLE) ×2 IMPLANT
TUBE CONNECTING 12X1/4 (SUCTIONS) IMPLANT
WATER STERILE IRR 3000ML UROMA (IV SOLUTION) ×2 IMPLANT

## 2021-07-15 NOTE — Anesthesia Preprocedure Evaluation (Deleted)
Anesthesia Evaluation  Patient identified by MRN, date of birth, ID band Patient awake  General Assessment Comment:Recent CABG  Reviewed: Allergy & Precautions, Patient's Chart, lab work & pertinent test results  Airway Mallampati: II  TM Distance: >3 FB Neck ROM: Full    Dental   Pulmonary former smoker,    breath sounds clear to auscultation       Cardiovascular hypertension, + CAD, + CABG (2017) and + Peripheral Vascular Disease (s/p aorta-bifem)   Rhythm:Regular Rate:Normal  EKG reviewed: NSR   Neuro/Psych PSYCHIATRIC DISORDERS Anxiety Depression    GI/Hepatic PUD, GERD  ,  Endo/Other  negative endocrine ROS  Renal/GU negative Renal ROS     Musculoskeletal  (+) Arthritis ,   Abdominal   Peds  Hematology  (+) anemia ,   Anesthesia Other Findings   Reproductive/Obstetrics                                                              Anesthesia Evaluation  Patient identified by MRN, date of birth, ID band Patient awake    Reviewed: Allergy & Precautions, H&P , NPO status , Patient's Chart, lab work & pertinent test results  Airway Mallampati: II   Neck ROM: full    Dental   Pulmonary shortness of breath, former smoker,    breath sounds clear to auscultation       Cardiovascular hypertension, + CAD, + CABG and + Peripheral Vascular Disease   Rhythm:regular Rate:Normal  CABG 03/2016.  Normal LV fxn.   Neuro/Psych PSYCHIATRIC DISORDERS Anxiety Depression    GI/Hepatic GERD  ,  Endo/Other    Renal/GU      Musculoskeletal  (+) Arthritis ,   Abdominal   Peds  Hematology   Anesthesia Other Findings   Reproductive/Obstetrics                             Anesthesia Physical Anesthesia Plan  ASA: III  Anesthesia Plan: General   Post-op Pain Management:    Induction: Intravenous  Airway Management Planned: Oral  ETT  Additional Equipment: Arterial line, CVP and Ultrasound Guidance Line Placement  Intra-op Plan:   Post-operative Plan: Possible Post-op intubation/ventilation and Extubation in OR  Informed Consent: I have reviewed the patients History and Physical, chart, labs and discussed the procedure including the risks, benefits and alternatives for the proposed anesthesia with the patient or authorized representative who has indicated his/her understanding and acceptance.     Plan Discussed with: CRNA, Anesthesiologist and Surgeon  Anesthesia Plan Comments:         Anesthesia Quick Evaluation  Anesthesia Physical  Anesthesia Plan  ASA: 3 and emergent  Anesthesia Plan: General   Post-op Pain Management:    Induction: Intravenous  PONV Risk Score and Plan: 2  Airway Management Planned: Oral ETT  Additional Equipment: None  Intra-op Plan:   Post-operative Plan: Extubation in OR  Informed Consent: I have reviewed the patients History and Physical, chart, labs and discussed the procedure including the risks, benefits and alternatives for the proposed anesthesia with the patient or authorized representative who has indicated his/her understanding and acceptance.     Dental advisory given  Plan Discussed with: CRNA and Anesthesiologist  Anesthesia Plan Comments: (GETA.  Patient ate broth vs. Sandwich at 1pm ( will confirm in preop). )        Anesthesia Quick Evaluation

## 2021-07-15 NOTE — Anesthesia Preprocedure Evaluation (Addendum)
Anesthesia Evaluation  Patient identified by MRN, date of birth, ID band Patient awake    Reviewed: Allergy & Precautions, NPO status , Patient's Chart, lab work & pertinent test results  Airway Mallampati: II  TM Distance: >3 FB Neck ROM: Full    Dental  (+) Edentulous Upper, Edentulous Lower   Pulmonary neg pulmonary ROS, former smoker,    Pulmonary exam normal breath sounds clear to auscultation       Cardiovascular Exercise Tolerance: Good hypertension, Pt. on medications + CAD, + CABG (2017) and + Peripheral Vascular Disease (aorta-bifem)  Normal cardiovascular exam Rhythm:Regular Rate:Normal  EKG reviewed: NSR rate 90s   Neuro/Psych PSYCHIATRIC DISORDERS Anxiety Depression negative neurological ROS     GI/Hepatic Neg liver ROS, PUD, GERD  ,  Endo/Other  negative endocrine ROS  Renal/GU negative Renal ROS  negative genitourinary   Musculoskeletal  (+) Arthritis , Osteoarthritis,    Abdominal   Peds negative pediatric ROS (+)  Hematology  (+) anemia ,   Anesthesia Other Findings   Reproductive/Obstetrics negative OB ROS                            Anesthesia Physical Anesthesia Plan  ASA: 3 and emergent  Anesthesia Plan: General   Post-op Pain Management:    Induction:   PONV Risk Score and Plan: Treatment may vary due to age or medical condition and Ondansetron  Airway Management Planned: Oral ETT  Additional Equipment: None  Intra-op Plan:   Post-operative Plan: Extubation in OR  Informed Consent: I have reviewed the patients History and Physical, chart, labs and discussed the procedure including the risks, benefits and alternatives for the proposed anesthesia with the patient or authorized representative who has indicated his/her understanding and acceptance.       Plan Discussed with: Anesthesiologist and CRNA  Anesthesia Plan Comments:        Anesthesia  Quick Evaluation

## 2021-07-15 NOTE — Evaluation (Signed)
Clinical/Bedside Swallow Evaluation Patient Details  Name: Nicole Dean MRN: [id number] Date of Birth: 02-08-1941  Today's Date: 07/15/2021 Time: SLP Start Time (ACUTE ONLY): 1213 SLP Stop Time (ACUTE ONLY): 1225 SLP Time Calculation (min) (ACUTE ONLY): 12 min  Past Medical History:  Past Medical History:  Diagnosis Date   AAA (abdominal aortic aneurysm) (HCC)    VVS- following    Allergy    Anemia    Anxiety    Arthritis    hands   Blood transfusion without reported diagnosis    Cataract    removed bilateral    Cholecystitis 10/2016   Coronary artery disease    Depression    Gastric ulcer 05/27/2017   Dr Adela Lank   GERD (gastroesophageal reflux disease)    History of bronchitis    History of pneumonia    Hyperlipidemia    Hypertension    Numbness and tingling of right leg    Shortness of breath dyspnea    sometimes even with resting; pt. denies since heart surgery   Wears glasses    Past Surgical History:  Past Surgical History:  Procedure Laterality Date   ABDOMINAL AORTIC ANEURYSM REPAIR N/A 07/22/2016   Procedure: JUXTARENAL  ABDOMINAL AORTIC ANEURYSM REPAIR;  Surgeon: Chuck Hint, MD;  Location: [facility] OR;  Service: Vascular;  Laterality: N/A;   AORTA - BILATERAL FEMORAL ARTERY BYPASS GRAFT N/A 07/22/2016   Procedure: AORTA BIFEMORAL BYPASS GRAFT;  Surgeon: Chuck Hint, MD;  Location: [facility] OR;  Service: Vascular;  Laterality: N/A;   CABG x 4     CARDIAC CATHETERIZATION N/A 03/31/2016   Procedure: Left Heart Cath and Coronary Angiography;  Surgeon: Peter M Swaziland, MD;  Location: [facility] INVASIVE CV LAB;  Service: Cardiovascular;  Laterality: N/A;   CHOLECYSTECTOMY N/A 10/15/2016   Procedure: LAPAROSCOPIC CONVERTED TO OPEN CHOLECYSTECTOMY;  Surgeon: Claud Kelp, MD;  Location: [facility] OR;  Service: General;  Laterality: N/A;   CHOLECYSTECTOMY, LAPAROSCOPIC  10/15/2016   COLONOSCOPY     CORONARY ARTERY BYPASS GRAFT N/A 04/04/2016   Procedure: CORONARY ARTERY  BYPASS GRAFTING (CABG) x 4 using bilateral internal mammary arteries and right saphenous vein harvested by endovein;  Surgeon: Alleen Borne, MD;  Location: [facility] OR;  Service: Open Heart Surgery;  Laterality: N/A;   EYE SURGERY Bilateral    cataracts removed./ IOL   PERIPHERAL VASCULAR CATHETERIZATION N/A 03/03/2016   Procedure: Abdominal Aortogram w/Lower Extremity;  Surgeon: Chuck Hint, MD;  Location[facility] INVASIVE CV LAB;  Service: Cardiovascular;  Laterality: N/A;   REPAIR ILIAC ARTERY Right 07/22/2016   Procedure: RIGHT COMMON ILIAC ANEURYSM REPAIR;  Surgeon: Chuck Hint, MD;  Location: [facility] OR;  Service: Vascular;  Laterality: Right;   SHOULDER ARTHROSCOPY W/ ROTATOR CUFF REPAIR Right    TEE WITHOUT CARDIOVERSION N/A 04/04/2016   Procedure: TRANSESOPHAGEAL ECHOCARDIOGRAM (TEE);  Surgeon: Alleen Borne, MD;  Location: [facility] OR;  Service: Open Heart Surgery;  Laterality: N/A;   UPPER GASTROINTESTINAL ENDOSCOPY     HPI:  80 yo female with HTN, GERD, HLD, CAD s/p CABG in 2017, and AAA s/p repair in 2017, admitted for AMS in the setting of an UTI. No hx of dysphagia per pt report.  Assessment / Plan / Recommendation Clinical Impression  Pt presents with normal swallowing function.  Oral mechanism exam is normal with presence of dentures.  Pt with adequate mastication, the appearance of a brisk swallow response, no s/s of aspiration. Recommend a regular diet, thin liquids; meds with  water. No dysphagia.  SLP will sign off. SLP Visit Diagnosis: Dysphagia, unspecified (R13.10)    Aspiration Risk  No limitations    Diet Recommendation    Regular solids, thin liquids  Medication Administration: Whole meds with liquid    Other  Recommendations Oral Care Recommendations: Oral care BID   Follow up Recommendations None        Swallow Study   General Date of Onset: 07/14/21 HPI: 80 yo female with HTN, HLD, CAD s/p CABG in 2017, and AAA s/p repair in 2017, admitted for AMS in the  setting of an UTI. Type of Study: Bedside Swallow Evaluation Previous Swallow Assessment: no Diet Prior to this Study: Dysphagia 3 (soft);Thin liquids Temperature Spikes Noted: No Respiratory Status: Nasal cannula History of Recent Intubation: No Behavior/Cognition: Alert;Cooperative;Pleasant mood Oral Cavity Assessment: Within Functional Limits Oral Care Completed by SLP: Recent completion by staff Oral Cavity - Dentition: Dentures, top;Dentures, bottom Vision: Functional for self-feeding Self-Feeding Abilities: Able to feed self Patient Positioning: Upright in bed Baseline Vocal Quality: Normal Volitional Cough: Strong Volitional Swallow: Able to elicit    Oral/Motor/Sensory Function Overall Oral Motor/Sensory Function: Within functional limits   Ice Chips Ice chips: Within functional limits   Thin Liquid Thin Liquid: Within functional limits    Nectar Thick Nectar Thick Liquid: Not tested   Honey Thick Honey Thick Liquid: Not tested   Puree Puree: Within functional limits   Solid     Solid: Within functional limits     Nicole Dean L. Samson Frederic, MA CCC/SLP Acute Rehabilitation Services Office number [phone] Pager [phone]  Blenda Mounts Nicole Dean 07/15/2021,12:26 PM

## 2021-07-15 NOTE — Progress Notes (Signed)
Pharmacy Antibiotic Note  Nicole Dean is a 80 y.o. female admitted on 07/14/2021 with  altered mental status .  Pharmacy has been consulted for Vancomycin/Cefepime dosing for r/o sepsis, ?UTI. Was on Ceftriaxone. WBC 14.4. Renal function ok. Continues to be febrile.   Plan: Vancomycin 500 mg IV q24h >>Estimated AUC: 414 Cefepime 2g IV q12h Trend WBC, temp, renal function  F/U infectious work-up Drug levels as indicated      Temp (24hrs), Avg:100.1 F (37.8 C), Min:98.6 F (37 C), Max:103.1 F (39.5 C)  Recent Labs  Lab 07/14/21 1453 07/14/21 1608  WBC 14.4*  --   CREATININE 1.13*  --   LATICACIDVEN  --  1.8    Estimated Creatinine Clearance: 32.4 mL/min (A) (by C-G formula based on SCr of 1.13 mg/dL (H)).    Allergies  Allergen Reactions   Codeine Nausea And Vomiting    Severe, ended up in ED   Penicillins Nausea Only and Other (See Comments)    Has patient had a PCN reaction causing immediate rash, facial/tongue/throat swelling, SOB or lightheadedness with hypotension: Yes Has patient had a PCN reaction causing severe rash involving mucus membranes or skin necrosis: No Has patient had a PCN reaction that required hospitalization No Has patient had a PCN reaction occurring within the last 10 years: No If all of the above answers are "NO", then may proceed with Cephalosporin use.     Abran Duke, PharmD, BCPS Clinical Pharmacist Phone: [phone]

## 2021-07-15 NOTE — Consult Note (Signed)
Urology Consult   Physician requesting consult: Carmel SacramentoMonik Patel, MD  Reason for consult: Obstructing right ureteral stone with UTI  History of Present Illness: Nicole Dean is a 80 y.o. female who was found to have an obstructing 6 mm right UPJ calculus with mild right-sided hydronephrosis on CT from 07/15/2021.  She was initially admitted to the hospital on 07/14/21 due to AMS after being found down at home with subjective fevers of 103.1 F, generalized abdominal pain and general malaise.  She states that her right side pain has been present for the past 3-4 weeks.  CT head from 07/14/21 was WNL.   The patient denies a history of voiding or storage urinary symptoms, hematuria, UTIs, STDs, urolithiasis, GU malignancy/trauma/surgery.  Past Medical History:  Diagnosis Date   AAA (abdominal aortic aneurysm) (HCC)    VVS- following    Allergy    Anemia    Anxiety    Arthritis    hands   Blood transfusion without reported diagnosis    Cataract    removed bilateral    Cholecystitis 10/2016   Coronary artery disease    Depression    Gastric ulcer 05/27/2017   Dr Adela LankArmbruster   GERD (gastroesophageal reflux disease)    History of bronchitis    History of pneumonia    Hyperlipidemia    Hypertension    Numbness and tingling of right leg    Shortness of breath dyspnea    sometimes even with resting; pt. denies since heart surgery   Wears glasses     Past Surgical History:  Procedure Laterality Date   ABDOMINAL AORTIC ANEURYSM REPAIR N/A 07/22/2016   Procedure: JUXTARENAL  ABDOMINAL AORTIC ANEURYSM REPAIR;  Surgeon: Chuck Hinthristopher S Dickson, MD;  [facility];  Service: Vascular;  Laterality: N/A;   AORTA - BILATERAL FEMORAL ARTERY BYPASS GRAFT N/A 07/22/2016   Procedure: AORTA BIFEMORAL BYPASS GRAFT;  Surgeon: Chuck Hinthristopher S Dickson, MD;  [facility];  Service: Vascular;  Laterality: N/A;   CABG x 4     CARDIAC CATHETERIZATION N/A 03/31/2016   Procedure: Left Heart Cath and Coronary  Angiography;  Surgeon: Peter M SwazilandJordan, MD;  [facility]: [facility] INVASIVE CV LAB;  Service: Cardiovascular;  Laterality: N/A;   CHOLECYSTECTOMY N/A 10/15/2016   Procedure: LAPAROSCOPIC CONVERTED TO OPEN CHOLECYSTECTOMY;  Surgeon: Claud KelpHaywood Ingram, MD;  Location: [facility] OR;  Service: General;  Laterality: N/A;   CHOLECYSTECTOMY, LAPAROSCOPIC  10/15/2016   COLONOSCOPY     CORONARY ARTERY BYPASS GRAFT N/A 04/04/2016   Procedure: CORONARY ARTERY BYPASS GRAFTING (CABG) x 4 using bilateral internal mammary arteries and right saphenous vein harvested by endovein;  Surgeon: Alleen BorneBryan K Bartle, MD;  Location: [facility] OR;  Service: Open Heart Surgery;  Laterality: N/A;   EYE SURGERY Bilateral    cataracts removed./ IOL   PERIPHERAL VASCULAR CATHETERIZATION N/A 03/03/2016   Procedure: Abdominal Aortogram w/Lower Extremity;  Surgeon: Chuck Hinthristopher S Dickson, MD;  [facility];  Service: Cardiovascular;  Laterality: N/A;   REPAIR ILIAC ARTERY Right 07/22/2016   Procedure: RIGHT COMMON ILIAC ANEURYSM REPAIR;  Surgeon: Chuck Hinthristopher S Dickson, MD;  [facility];  Service: Vascular;  Laterality: Right;   SHOULDER ARTHROSCOPY W/ ROTATOR CUFF REPAIR Right    TEE WITHOUT CARDIOVERSION N/A 04/04/2016   Procedure: TRANSESOPHAGEAL ECHOCARDIOGRAM (TEE);  Surgeon: Alleen BorneBryan K Bartle, MD;  [facility];  Service: Open Heart Surgery;  Laterality: N/A;   UPPER GASTROINTESTINAL ENDOSCOPY      Current Hospital Medications:  Home Meds:  Current Meds  Medication Sig   acetaminophen (TYLENOL) 325 MG tablet Take 2 tablets (650 mg total) by mouth every 4 (four) hours as needed for mild pain, moderate pain or fever.   albuterol (VENTOLIN HFA) 108 (90 Base) MCG/ACT inhaler Inhale 1 puff into the lungs every 6 (six) hours as needed for shortness of breath.   carvedilol (COREG) 6.25 MG tablet TAKE 1 TABLET BY MOUTH TWICE A DAY   enalapril (VASOTEC) 20 MG tablet Take 20 mg by mouth daily.   hydrochlorothiazide (HYDRODIURIL) 25 MG tablet Take  25 mg by mouth daily.   Multiple Vitamins-Iron (MULTIVITAMIN/IRON PO) Take 1 tablet by mouth daily.   omeprazole (PRILOSEC) 40 MG capsule Take 40 mg by mouth daily.   rosuvastatin (CRESTOR) 20 MG tablet Take 1 tablet (20 mg total) by mouth daily.   senna (SENOKOT) 8.6 MG TABS tablet Take 1 tablet (8.6 mg total) by mouth 2 (two) times daily.   sertraline (ZOLOFT) 100 MG tablet Take 2 tablets by mouth daily.   traZODone (DESYREL) 100 MG tablet Take 100 mg by mouth at bedtime.    Scheduled Meds:  aspirin EC  81 mg Oral Daily   carvedilol  6.25 mg Oral BID   enoxaparin (LOVENOX) injection  40 mg Subcutaneous Q24H   pantoprazole  40 mg Oral Daily   rosuvastatin  20 mg Oral Daily   sodium chloride flush  3 mL Intravenous Q12H   Continuous Infusions:  sodium chloride Stopped (07/15/21 1025)   cefTRIAXone (ROCEPHIN)  IV     lactated ringers 100 mL/hr at 07/15/21 1026   PRN Meds:.sodium chloride, acetaminophen **OR** acetaminophen, senna-docusate  Allergies:  Allergies  Allergen Reactions   Codeine Nausea And Vomiting    Severe, ended up in ED   Penicillins Nausea Only and Other (See Comments)    Has patient had a PCN reaction causing immediate rash, facial/tongue/throat swelling, SOB or lightheadedness with hypotension: Yes Has patient had a PCN reaction causing severe rash involving mucus membranes or skin necrosis: No Has patient had a PCN reaction that required hospitalization No Has patient had a PCN reaction occurring within the last 10 years: No If all of the above answers are "NO", then may proceed with Cephalosporin use.     Family History  Problem Relation Age of Onset   Cancer Father        stomach   Stomach cancer Father    Heart disease Maternal Grandfather    Stomach cancer Brother    Heart disease Maternal Uncle    Colon polyps Brother    Colon cancer Neg Hx    Esophageal cancer Neg Hx    Rectal cancer Neg Hx     Social History:  reports that she quit smoking  about 25 years ago. Her smoking use included cigarettes. She has a 52.50 pack-year smoking history. She has never used smokeless tobacco. She reports that she does not drink alcohol and does not use drugs.  ROS: A complete review of systems was performed.  All systems are negative except for pertinent findings as noted.  Physical Exam:  Vital signs in last 24 hours: Temp:  [98 F (36.7 C)-103.1 F (39.5 C)] 98 F (36.7 C) (08/15 1211) Pulse Rate:  [73-101] 80 (08/15 1421) Resp:  [16-32] 27 (08/15 1421) BP: (99-169)/(41-130) 151/67 (08/15 1421) SpO2:  [89 %-100 %] 99 % (08/15 1421) Weight:  [59 kg] 59 kg (08/15 4481) Constitutional:  Alert and oriented, No acute distress Cardiovascular: Regular rate  and rhythm, No JVD Respiratory: Normal respiratory effort, Lungs clear bilaterally GI: Abdomen is soft, nontender, nondistended, no abdominal masses GU: No CVA tenderness Lymphatic: No lymphadenopathy Neurologic: Grossly intact, no focal deficits Psychiatric: Normal mood and affect  Laboratory Data:  Recent Labs    07/14/21 1453 07/15/21 0627  WBC 14.4* 9.4  HGB 12.5 10.9*  HCT 37.9 32.3*  PLT 174 149*    Recent Labs    07/14/21 1453 07/15/21 0627  NA 133* 134*  K 4.8 4.2  CL 98 101  GLUCOSE 151* 129*  BUN 35* 30*  CALCIUM 9.1 8.6*  CREATININE 1.13* 0.94     Results for orders placed or performed during the hospital encounter of 07/14/21 (from the past 24 hour(s))  Troponin I (High Sensitivity)     Status: Abnormal   Collection Time: 07/14/21  3:51 PM  Result Value Ref Range   Troponin I (High Sensitivity) 31 (H) <18 ng/L  Lactic acid, plasma     Status: None   Collection Time: 07/14/21  4:08 PM  Result Value Ref Range   Lactic Acid, Venous 1.8 0.5 - 1.9 mmol/L  Blood culture (routine x 2)     Status: None (Preliminary result)   Collection Time: 07/14/21  4:38 PM   Specimen: BLOOD  Result Value Ref Range   Specimen Description BLOOD LEFT ANTECUBITAL     Special Requests      BOTTLES DRAWN AEROBIC AND ANAEROBIC Blood Culture results may not be optimal due to an inadequate volume of blood received in culture bottles   Culture      NO GROWTH < 24 HOURS Performed at [facility], [address] [place], [place] [address]    Report Status PENDING   Basic metabolic panel     Status: Abnormal   Collection Time: 07/15/21  6:27 AM  Result Value Ref Range   Sodium 134 (L) 135 - 145 mmol/L   Potassium 4.2 3.5 - 5.1 mmol/L   Chloride 101 98 - 111 mmol/L   CO2 25 22 - 32 mmol/L   Glucose, Bld 129 (H) 70 - 99 mg/dL   BUN 30 (H) 8 - 23 mg/dL   Creatinine, Ser 1.91 0.44 - 1.00 mg/dL   Calcium 8.6 (L) 8.9 - 10.3 mg/dL   GFR, Estimated >47 >82 mL/min   Anion gap 8 5 - 15  CBC     Status: Abnormal   Collection Time: 07/15/21  6:27 AM  Result Value Ref Range   WBC 9.4 4.0 - 10.5 K/uL   RBC 3.32 (L) 3.87 - 5.11 MIL/uL   Hemoglobin 10.9 (L) 12.0 - 15.0 g/dL   HCT 95.6 (L) 21.3 - 08.6 %   MCV 97.3 80.0 - 100.0 fL   MCH 32.8 26.0 - 34.0 pg   MCHC 33.7 30.0 - 36.0 g/dL   RDW 57.8 46.9 - 62.9 %   Platelets 149 (L) 150 - 400 K/uL   nRBC 0.0 0.0 - 0.2 %  Protime-INR     Status: Abnormal   Collection Time: 07/15/21  6:27 AM  Result Value Ref Range   Prothrombin Time 15.6 (H) 11.4 - 15.2 seconds   INR 1.2 0.8 - 1.2  APTT     Status: Abnormal   Collection Time: 07/15/21  6:27 AM  Result Value Ref Range   aPTT 38 (H) 24 - 36 seconds   Recent Results (from the past 240 hour(s))  Resp Panel by RT-PCR (Flu A&B, Covid) Nasopharyngeal Swab  Status: None   Collection Time: 07/14/21  1:58 PM   Specimen: Nasopharyngeal Swab; Nasopharyngeal(NP) swabs in vial transport medium  Result Value Ref Range Status   SARS Coronavirus 2 by RT PCR NEGATIVE NEGATIVE Final    Comment: (NOTE) SARS-CoV-2 target nucleic acids are NOT DETECTED.  The SARS-CoV-2 RNA is generally detectable in upper respiratory specimens during the acute phase of infection. The  lowest concentration of SARS-CoV-2 viral copies this assay can detect is 138 copies/mL. A negative result does not preclude SARS-Cov-2 infection and should not be used as the sole basis for treatment or other patient management decisions. A negative result may occur with  improper specimen collection/handling, submission of specimen other than nasopharyngeal swab, presence of viral mutation(s) within the areas targeted by this assay, and inadequate number of viral copies(<138 copies/mL). A negative result must be combined with clinical observations, patient history, and epidemiological information. The expected result is Negative.  Fact Sheet for Patients:  BloggerCourse.com  Fact Sheet for Healthcare Providers:  SeriousBroker.it  This test is no t yet approved or cleared by the [place] FDA and  has been authorized for detection and/or diagnosis of SARS-CoV-2 by FDA under an Emergency Use Authorization (EUA). This EUA will remain  in effect (meaning this test can be used) for the duration of the COVID-19 declaration under Section 564(b)(1) of the Act, 21 U.S.C.section 360bbb-3(b)(1), unless the authorization is terminated  or revoked sooner.       Influenza A by PCR NEGATIVE NEGATIVE Final   Influenza B by PCR NEGATIVE NEGATIVE Final    Comment: (NOTE) The Xpert Xpress SARS-CoV-2/FLU/RSV plus assay is intended as an aid in the diagnosis of influenza from Nasopharyngeal swab specimens and should not be used as a sole basis for treatment. Nasal washings and aspirates are unacceptable for Xpert Xpress SARS-CoV-2/FLU/RSV testing.  Fact Sheet for Patients: BloggerCourse.com  Fact Sheet for Healthcare Providers: SeriousBroker.it  This test is not yet approved or cleared by the [place] FDA and has been authorized for detection and/or diagnosis of SARS-CoV-2 by FDA under  an Emergency Use Authorization (EUA). This EUA will remain in effect (meaning this test can be used) for the duration of the COVID-19 declaration under Section 564(b)(1) of the Act, 21 U.S.C. section 360bbb-3(b)(1), unless the authorization is terminated or revoked.  Performed at [facility], [address], [place], [place] [address]   Blood culture (routine x 2)     Status: None (Preliminary result)   Collection Time: 07/14/21  2:33 PM   Specimen: BLOOD  Result Value Ref Range Status   Specimen Description BLOOD RIGHT ANTECUBITAL  Final   Special Requests   Final    BOTTLES DRAWN AEROBIC AND ANAEROBIC Blood Culture results may not be optimal due to an inadequate volume of blood received in culture bottles   Culture   Final    NO GROWTH < 24 HOURS Performed at [facility], [address], [place], [place] [address]    Report Status PENDING  Incomplete  Urine Culture     Status: Abnormal (Preliminary result)   Collection Time: 07/14/21  3:00 PM   Specimen: Urine, Clean Catch  Result Value Ref Range Status   Specimen Description URINE, CLEAN CATCH  Final   Special Requests NONE  Final   Culture (A)  Final    >=100,000 COLONIES/mL PROTEUS MIRABILIS SUSCEPTIBILITIES TO FOLLOW Performed at [facility], [address], [place], [place] [address]  Report Status PENDING  Incomplete  Blood culture (routine x 2)     Status: None (Preliminary result)   Collection Time: 07/14/21  4:38 PM   Specimen: BLOOD  Result Value Ref Range Status   Specimen Description BLOOD LEFT ANTECUBITAL  Final   Special Requests   Final    BOTTLES DRAWN AEROBIC AND ANAEROBIC Blood Culture results may not be optimal due to an inadequate volume of blood received in culture bottles   Culture   Final    NO GROWTH < 24 HOURS Performed at [facility], [address] [place], [place] [address]    Report Status PENDING  Incomplete    Renal Function: Recent Labs    07/14/21 1453  07/15/21 0627  CREATININE 1.13* 0.94   Estimated Creatinine Clearance: 38.4 mL/min (by C-G formula based on SCr of 0.94 mg/dL).  Radiologic Imaging: CT HEAD WO CONTRAST ( )  Result Date: 07/14/2021 CLINICAL DATA:  Mental status change.  Patient found on floor. EXAM: CT HEAD WITHOUT CONTRAST TECHNIQUE: Contiguous axial images were obtained from the base of the skull through the vertex without intravenous contrast. COMPARISON:  January 11, 2017 FINDINGS: Brain: No subdural, epidural, or subarachnoid hemorrhage. 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 Cerebellum, brainstem, and basal cisterns are normal. No mass effect or midline shift. Ventricles and sulci are unremarkable. No acute cortical ischemia or infarct. Vascular: Calcified atherosclerosis in the intracranial carotids. Skull: Normal. Negative for fracture or focal lesion. Sinuses/Orbits: No acute finding. Other: None. IMPRESSION: 1. No acute intracranial abnormalities. No cause for the patient's symptoms identified. Electronically Signed   By: Gerome Sam III M.D.   On: 07/14/2021 14:45   MR BRAIN WO CONTRAST  Result Date: 07/14/2021 CLINICAL DATA:  Neuro deficit, acute, stroke suspected. Confusion. Found on the ground. EXAM: MRI HEAD WITHOUT CONTRAST TECHNIQUE: Multiplanar, multiecho pulse sequences of the brain and surrounding structures were obtained without intravenous contrast. COMPARISON:  Head CT earlier same day.  MRI 10/16/2016. FINDINGS: Brain: Diffusion imaging does not show any acute or subacute infarction. No abnormality affects the brainstem. 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 Minimal small vessel change of the cerebral hemispheric white matter. No cortical or large vessel territory infarction. No mass lesion, hemorrhage, hydrocephalus or extra-axial collection. Vascular: Major vessels at the base of the brain show flow. Skull and upper cervical spine:  Negative Sinuses/Orbits: Clear/normal Other: None IMPRESSION: No acute finding. 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 Minimal small vessel ischemic change of the cerebral hemispheric white matter. Electronically Signed   By: Paulina Fusi M.D.   On: 07/14/2021 18:36   CT ABDOMEN PELVIS W CONTRAST  Result Date: 07/15/2021 CLINICAL DATA:  Acute right lower quadrant abdominal pain. EXAM: CT ABDOMEN AND PELVIS WITH CONTRAST TECHNIQUE: Multidetector CT imaging of the abdomen and pelvis was performed using the standard protocol following bolus administration of intravenous contrast. CONTRAST:  50mL OMNIPAQUE IOHEXOL 350 MG/ML SOLN COMPARISON:  October 13, 2016. FINDINGS: Lower chest: Minimal right pleural effusion is noted with minimal adjacent subsegmental atelectasis. Hepatobiliary: No focal liver abnormality is seen. Status post cholecystectomy. No biliary dilatation. Pancreas: Unremarkable. No pancreatic ductal dilatation or surrounding inflammatory changes. Spleen: Normal in size without focal abnormality. Adrenals/Urinary Tract: Adrenal glands appear normal. Nonobstructive right renal calculus is noted. Bilateral renal cysts are noted. Mild right hydronephrosis is noted secondary to 6 mm calculus in proximal right ureter. Urinary bladder is  unremarkable. Stomach/Bowel: Stomach is within normal limits. Appendix appears normal. No evidence of bowel wall thickening, distention, or inflammatory changes. Vascular/Lymphatic: Status post aortobifemoral bypass graft placement. No significant adenopathy is noted. Reproductive: Uterus and bilateral adnexa are unremarkable. Other: Small fat containing left inguinal hernia is noted. No ascites is noted. Musculoskeletal: No acute or significant osseous findings. IMPRESSION: Mild right hydronephrosis is noted secondary to 6 mm calculus in proximal right ureter. Nonobstructive right renal calculus is noted. Minimal right pleural effusion is noted  with adjacent subsegmental atelectasis. Status post aortofemoral bypass graft placement. Aortic Atherosclerosis (ICD10-I70.0). Electronically Signed   By: Lupita Raider M.D.   On: 07/15/2021 13:19   DG Chest Portable 1 View  Result Date: 07/14/2021 CLINICAL DATA:  Altered mental status EXAM: PORTABLE CHEST 1 VIEW COMPARISON:  07/28/2016 FINDINGS: Patient is slightly rotated. Post CABG changes. Stable heart size. Atherosclerotic calcification of the aortic knob. No focal airspace consolidation, pleural effusion, or pneumothorax. IMPRESSION: No active disease. Electronically Signed   By: Duanne Guess D.O.   On: 07/14/2021 14:12    I independently reviewed the above imaging studies.  Impression/Recommendation 80 year old female with urosepsis secondary to an obstructing 6 mm right UPJ calculus  -The risks, benefits and alternatives of cystoscopy with RIGHT JJ stent placement was discussed with the patient.  Risks include, but are not limited to: bleeding, urinary tract infection, ureteral injury, ureteral stricture disease, chronic pain, urinary symptoms, bladder injury, stent migration, the need for nephrostomy tube placement, MI, CVA, DVT, PE and the inherent risks with general anesthesia.  The patient voices understanding and wishes to proceed.    Rhoderick Moody, MD [facility] 07/15/2021, 3:31 PM

## 2021-07-15 NOTE — Anesthesia Procedure Notes (Signed)
Procedure Name: Intubation Date/Time: 07/15/2021 4:20 PM Performed by: Jonna Munro, CRNA Pre-anesthesia Checklist: Patient identified, Patient being monitored, Timeout performed, Emergency Drugs available and Suction available Patient Re-evaluated:Patient Re-evaluated prior to induction Oxygen Delivery Method: Circle System Utilized Preoxygenation: Pre-oxygenation with 100% oxygen Induction Type: IV induction, Rapid sequence and Cricoid Pressure applied Ventilation: Mask ventilation without difficulty Laryngoscope Size: Mac and 3 Grade View: Grade I Tube type: Oral Tube size: 7.0 mm Number of attempts: 1 Airway Equipment and Method: stylet Placement Confirmation: ETT inserted through vocal cords under direct vision, positive ETCO2 and breath sounds checked- equal and bilateral Secured at: 22 cm Tube secured with: Tape Dental Injury: Teeth and Oropharynx as per pre-operative assessment  Comments: Placed by Orland Mustard SRNA

## 2021-07-15 NOTE — Plan of Care (Signed)
  Problem: Education: Goal: Knowledge of General Education information will improve Description Including pain rating scale, medication(s)/side effects and non-pharmacologic comfort measures Outcome: Progressing   

## 2021-07-15 NOTE — Anesthesia Postprocedure Evaluation (Signed)
Anesthesia Post Note  Patient: Nicole Dean  Procedure(s) Performed: CYSTOSCOPY WITH RETROGRADE PYELOGRAM/URETERAL STENT PLACEMENT (Right)     Patient location during evaluation: PACU Anesthesia Type: General Level of consciousness: awake Pain management: pain level controlled Vital Signs Assessment: post-procedure vital signs reviewed and stable Respiratory status: spontaneous breathing, nonlabored ventilation, respiratory function stable and patient connected to nasal cannula oxygen Cardiovascular status: blood pressure returned to baseline and stable Postop Assessment: no apparent nausea or vomiting Anesthetic complications: no   No notable events documented.  Last Vitals:  Vitals:   07/15/21 1740 07/15/21 1756  BP: (!) 129/58 125/60  Pulse: 76 76  Resp: 20   Temp: 36.8 C 37.5 C  SpO2: 99% 92%    Last Pain:  Vitals:   07/15/21 1756  TempSrc: Oral  PainSc:                  Catheryn Bacon Jacynda Brunke

## 2021-07-15 NOTE — Transfer of Care (Signed)
Immediate Anesthesia Transfer of Care Note  Patient: Nicole Dean  Procedure(s) Performed: CYSTOSCOPY WITH RETROGRADE PYELOGRAM/URETERAL STENT PLACEMENT (Right)  Patient Location: PACU  Anesthesia Type:General  Level of Consciousness: awake and confused  Airway & Oxygen Therapy: Patient Spontanous Breathing and Patient connected to face mask oxygen  Post-op Assessment: Report given to RN and Post -op Vital signs reviewed and stable  Post vital signs: Reviewed and stable  Last Vitals:  Vitals Value Taken Time  BP 130/59 07/15/21 1655  Temp    Pulse 87 07/15/21 1658  Resp 20 07/15/21 1658  SpO2 93 % 07/15/21 1658  Vitals shown include unvalidated device data.  Last Pain:  Vitals:   07/15/21 1609  TempSrc: Oral  PainSc: 0-No pain         Complications: No notable events documented.

## 2021-07-15 NOTE — Op Note (Signed)
Operative Note  Preoperative diagnosis:  1.  6 mm right ureteral calculus 2.  Urosepsis  Postoperative diagnosis: 1.  6 mm right ureteral calculus 2.  Urosepsis  Procedure(s): 1.  Cystoscopy with right ureteral stent placement 2.  Right retrograde pyelogram with intraoperative interpretation of fluoroscopic imaging  Surgeon: Rhoderick Moody, MD  Assistants:  None  Anesthesia:  General  Complications:  None  EBL: Less than 5 mL  Specimens: 1.  None  Drains/Catheters: 1.  Right 6 French, 24 cm JJ stent without tether 2.  16 French Foley catheter with 10 mL of sterile water in the balloon  Intraoperative findings:   Right retrograde pyelogram revealed dilation of the right renal pelvis and proximal aspects of the right ureter, likely secondary to obstructing calculus.  There were no distal filling defects seen within the right ureter.  Indication:  Nicole Dean is a 80 y.o. female with urosepsis secondary to an obstructing 6 mm right ureteral stone.  She has been consented for the above procedures, voices understanding and wishes to proceed.  Description of procedure:  After informed consent was obtained, the patient was brought to the operating room and general anesthesia was administered. The patient was then placed in the dorsolithotomy position and prepped and draped in the usual sterile fashion. A timeout was performed. A 23 French rigid cystoscope was then inserted into the urethral meatus and advanced into the bladder under direct vision. A complete bladder survey revealed no intravesical pathology.  A 5 French ureteral catheter was then inserted into the right ureteral orifice and a retrograde pyelogram was obtained, with the findings listed above.  A Glidewire was then used to intubate the lumen of the ureteral catheter and was advanced up to the right renal pelvis, under fluoroscopic guidance.  The catheter was then removed, leaving the wire in place.  A 6  French, 24 cm JJ stent was advanced over the wire and into good position within the right collecting system, confirming placement via fluoroscopy.  A 16 French Foley catheter was then inserted into the bladder.  The balloon of the Foley catheter was then inflated with 10 mL of sterile water and placed to gravity drainage.  The patient tolerated the procedure well and was transferred to the postanesthesia in stable condition.  Plan: Remove Foley catheter in 24 hours.  Plan for definitive stone treatment in 2 to 3 weeks as an outpatient.

## 2021-07-15 NOTE — Progress Notes (Addendum)
   Subjective: No acute overnight events. Patient was seen at bedside during rounds this morning. Pt reports feeling better this AM. Pt complains of headache, diaphoresis, abdominal pain, occasional dysuria, and feels "a little feverish". Pt denies slurred speech, lightheadedness, or back pain, N/V . No other complains or concerns at this time.   Objective:  Vital signs in last 24 hours: Vitals:   07/15/21 0545 07/15/21 0600 07/15/21 0620 07/15/21 0638  BP: (!) 153/55 134/63 99/87   Pulse: 97 99 97   Resp: (!) 32 (!) 22 (!) 22   Temp:   (!) 102.6 F (39.2 C)   TempSrc:   Oral   SpO2: 98% 94% 92%   Weight:    59 kg  Height:    5\' 2"  (1.575 m)   Constitutional: alert, well-appearing, in no acute distress HENT: normocephalic, atraumatic, mucous membranes moist Neck: supple, non-enlarged thyroid  Cardiovascular: regular rate and rhythm, no m/r/g Pulmonary/Chest: normal work of breathing on room air, lungs clear to auscultation bilaterally Abdominal: soft, mild tenderness to palpation in right lower abdomen, non-distended Neurological: alert & oriented x 3  Skin: warm and dry Psych: normal behavior, normal affect   Assessment/Plan:  Principal Problem:   Sepsis secondary to UTI [facility]) Active Problems:   Atherosclerotic peripheral vascular disease (HCC)   CAD (coronary artery disease)   Generalized abdominal pain   Acute metabolic encephalopathy  Nicole Dean is a 80 yo female with HTN, HLD, CAD s/p CABG in 2017, and AAA s/p repair in 2017, admitted for AMS in the setting of an UTI.  Sepsis likely 2/2 UTI Acute metabolic encephalology  Elderly pt found on floor after unknown period of time, covered in urine. Confused and unaware of situation on presentation, and temp 101.2. CK 308, trops 33 -> 31. No signs of trauma. No hx of seizures, syncope, or strokes. U/A show large leukocyte esterase and bacteria, started rocephin. Source of confusion likely UTI vs hx of ativan use. CXR,  CT head, MRI head negative for acute changes. Ca 9.1 -> 8.6. Lactic acid 1.8, blood cultures show no growth at 1 day, urine culture pending. Overnight, febrile to 103 and BP 99/87 (SBP 130-150s previously) with increased confusion, abx broadened to vanc and cefepime. This AM, temp 98.5, pt is oriented x 3, WBC improving 14.4 -> 9.4, BP 110/46, pulse 80-90s. Hemoglobin 12.5 yesterday -> 10.9 today, likely from hemodilution. Suspect source likely UTI vs intra-abdominal. On exam, pt complains of RLQ abdominal pain. Ddx includes abscess, diverticulitis, pyelonephritis, and nephrolithiasis.   -continue ceftriaxone  -CT abdomen given RLQ pain and recent vomiting -Delirium precautions  -PT/OT eval and treat   Coronary artery disease s/p CABG x4 HTN HLD Patient underwent CABG in 2017, no complications noted. Patient is not having any chest pain, palpitations, or shortness of breath. She takes aspirin, rosuvastatin, and carvedilol at home.  - Continue home meds - Cardiac monitoring   Best Practice: Diet: soft diet  IVF: None,None VTE: Enoxaparin 40 Code: Full  Signature: 2018, MD  Internal Medicine Resident, PGY-1 [facility]  Pager: [phone] 8:44 AM, 07/15/2021  After 5pm on weekdays and 1pm on weekends: On Call pager [phone]

## 2021-07-16 ENCOUNTER — Encounter (HOSPITAL_COMMUNITY): Payer: Self-pay | Admitting: Urology

## 2021-07-16 DIAGNOSIS — A419 Sepsis, unspecified organism: Principal | ICD-10-CM

## 2021-07-16 DIAGNOSIS — N390 Urinary tract infection, site not specified: Secondary | ICD-10-CM

## 2021-07-16 LAB — BASIC METABOLIC PANEL
Anion gap: 10 (ref 5–15)
BUN: 27 mg/dL — ABNORMAL HIGH (ref 8–23)
CO2: 26 mmol/L (ref 22–32)
Calcium: 8.8 mg/dL — ABNORMAL LOW (ref 8.9–10.3)
Chloride: 98 mmol/L (ref 98–111)
Creatinine, Ser: 0.9 mg/dL (ref 0.44–1.00)
GFR, Estimated: >60 mL/min (ref >60)
Glucose, Bld: 183 mg/dL — ABNORMAL HIGH (ref 70–99)
Potassium: 3.8 mmol/L (ref 3.5–5.1)
Sodium: 134 mmol/L — ABNORMAL LOW (ref 135–145)

## 2021-07-16 LAB — CBC
HCT: 34.7 % — ABNORMAL LOW (ref 36.0–46.0)
Hemoglobin: 11.6 g/dL — ABNORMAL LOW (ref 12.0–15.0)
MCH: 32.3 pg (ref 26.0–34.0)
MCHC: 33.4 g/dL (ref 30.0–36.0)
MCV: 96.7 fL (ref 80.0–100.0)
Platelets: 166 10*3/uL (ref 150–400)
RBC: 3.59 MIL/uL — ABNORMAL LOW (ref 3.87–5.11)
RDW: 13 % (ref 11.5–15.5)
WBC: 6.4 10*3/uL (ref 4.0–10.5)
nRBC: 0 % (ref 0.0–0.2)

## 2021-07-16 LAB — URINE CULTURE: Culture: >=100000 — AB

## 2021-07-16 MED ORDER — CEPHALEXIN 500 MG PO CAPS: 500.0000 mg | ORAL_CAPSULE | Freq: Four times a day (QID) | ORAL | 0 refills | Status: AC

## 2021-07-16 NOTE — Discharge Instructions (Addendum)
Ms. Territo you were admitted to [facility] because of UTI and kidney stone which caused the kidney to swell up, leading to infection. You were treated with IV antibiotics, and are being discharged with 4 days of antibiotics that you should fully complete even if you feel completely fine. It is important to finish all of them. Please follow up with the urology and your general doctor, with the numbers included in your discharge packet. Please come back to the ED if you have worsening fevers, lightheadedness, or low BP.

## 2021-07-16 NOTE — Discharge Summary (Signed)
Name: Nicole Dean MRN: [id number] DOB: 1941/10/18 80 y.o. PCP: Charlies Silvers, PA-C  Date of Admission: 07/14/2021  1:35 PM Date of Discharge:  07/16/21 Attending Physician: Dr. Criselda Peaches  DISCHARGE DIAGNOSIS:  Primary Problem: Sepsis secondary to UTI [facility])   Hospital Problems: Principal Problem:   Sepsis secondary to UTI [facility]) Active Problems:   Atherosclerotic peripheral vascular disease (HCC)   CAD (coronary artery disease)   Generalized abdominal pain   Acute metabolic encephalopathy    DISCHARGE MEDICATIONS:   Allergies as of 07/16/2021       Reactions   Codeine Nausea And Vomiting   Severe, ended up in ED   Penicillins Nausea Only, Other (See Comments)   Has patient had a PCN reaction causing immediate rash, facial/tongue/throat swelling, SOB or lightheadedness with hypotension: Yes Has patient had a PCN reaction causing severe rash involving mucus membranes or skin necrosis: No Has patient had a PCN reaction that required hospitalization No Has patient had a PCN reaction occurring within the last 10 years: No If all of the above answers are "NO", then may proceed with Cephalosporin use.        Medication List     STOP taking these medications    traMADol 50 MG tablet Commonly known as: ULTRAM       TAKE these medications    acetaminophen 325 MG tablet Commonly known as: TYLENOL Take 2 tablets (650 mg total) by mouth every 4 (four) hours as needed for mild pain, moderate pain or fever.   albuterol 108 (90 Base) MCG/ACT inhaler Commonly known as: VENTOLIN HFA Inhale 1 puff into the lungs every 6 (six) hours as needed for shortness of breath.   carvedilol 6.25 MG tablet Commonly known as: COREG TAKE 1 TABLET BY MOUTH TWICE A DAY   cephALEXin 500 MG capsule Commonly known as: KEFLEX Take 1 capsule (500 mg total) by mouth every 6 (six) hours for 4 days.   enalapril 20 MG tablet Commonly known as: VASOTEC Take 20 mg by mouth daily.    hydrochlorothiazide 25 MG tablet Commonly known as: HYDRODIURIL Take 25 mg by mouth daily.   MULTIVITAMIN/IRON PO Take 1 tablet by mouth daily.   omeprazole 40 MG capsule Commonly known as: PRILOSEC Take 40 mg by mouth daily.   rosuvastatin 20 MG tablet Commonly known as: CRESTOR Take 1 tablet (20 mg total) by mouth daily.   senna 8.6 MG Tabs tablet Commonly known as: SENOKOT Take 1 tablet (8.6 mg total) by mouth 2 (two) times daily.   sertraline 100 MG tablet Commonly known as: ZOLOFT Take 2 tablets by mouth daily.   traZODone 100 MG tablet Commonly known as: DESYREL Take 100 mg by mouth at bedtime.       ASK your doctor about these medications    aspirin EC 81 MG tablet Take 1 tablet (81 mg total) by mouth daily. Swallow whole.   calcium carbonate 500 MG chewable tablet Commonly known as: TUMS - dosed in mg elemental calcium Chew 5 tablets by mouth as needed.   LORazepam 1 MG tablet Commonly known as: ATIVAN Take 1 mg by mouth 2 (two) times daily as needed.   triamcinolone ointment 0.1 % Commonly known as: KENALOG Apply 1 application topically 3 (three) times daily as needed. As needed for allergic reaction        DISPOSITION AND FOLLOW-UP:  Nicole Dean was discharged from [facility] in Stable condition. At the hospital follow up visit please  address:  Follow-up Recommendations: Consults: urology  Labs: none  Studies: none  Medications: keflex 500 mg q6h for 4 days   Follow-up Appointments:  Follow-up Information     [facility] Follow up in 1 week(s).   Why: My office will call to schedule your next pre-op appointment. Contact information: [address] [place] [id number] [phone]        Charlies Silversouillard, Jennifer, [place]. Schedule an appointment as soon as possible for a visit in 1 week(s).   Specialty: Physician Assistant Contact information: [address] [place]  [id number] [phone]                 HOSPITAL COURSE:  Patient Summary: Sepsis 2/2 urinary source  Acute metabolic encephalology  Resolved encephalopathy after starting ABX, in setting of CXR, CT head, MRI head negative for acute changes. Pt A&O x 3. CT abdomen show mild right hydronephrosis secondary to 6 mm calculus in proximal right ureter. Urology consulted, performed cytoscopy with right ureteral stent placement. Pt remains afebrile. Blood cultures show no growth at day 2, and urine culture show proteus. Completed 3 days of rocephin. WBC improving 14.4 admission ->  6.4 on discharge. Discharged on Keflex 500 q6h for 4 days. Plan for definitive stone treatment in 2 to 3 weeks as an outpatient, per urology.   Coronary artery disease s/p CABG x4 HTN HLD Patient underwent CABG in 2017, no complications noted. Patient is not having any chest pain, palpitations, or shortness of breath. She takes aspirin, rosuvastatin, and carvedilol at home.  - Continue home meds - Cardiac monitoring   DISCHARGE INSTRUCTIONS:   Discharge Instructions     Call MD for:  difficulty breathing, headache or visual disturbances   Complete by: As directed    Call MD for:  extreme fatigue   Complete by: As directed    Call MD for:  persistant dizziness or light-headedness   Complete by: As directed    Call MD for:  persistant nausea and vomiting   Complete by: As directed    Call MD for:  redness, tenderness, or signs of infection (pain, swelling, redness, odor or green/yellow discharge around incision site)   Complete by: As directed    Call MD for:  severe uncontrolled pain   Complete by: As directed    Call MD for:  temperature >100.4   Complete by: As directed    Diet - low sodium heart healthy   Complete by: As directed    Increase activity slowly   Complete by: As directed        SUBJECTIVE:   No acute overnight events. Patient was seen at bedside during rounds this morning. Pt reports  feeling well this morning, and asking when she can go back home. Pt complains of mild abdominal pain on the right side. She states that her dysuria has not improved. Pt denies confusion, active hallucinations, lightheadedness, fever, SOB, or chest pain . No other complains or concerns at this time.   Discharge Vitals:   BP (!) 151/62 (BP Location: Left Arm)   Pulse 67   Temp 98 F (36.7 C) (Oral)   Resp 19   Ht 5\' 2"  (1.575 m)   Wt 59 kg   SpO2 96%   BMI 23.78 kg/m   OBJECTIVE:  Constitutional: alert, well-appearing, in no acute distress HENT: normocephalic, atraumatic, mucous membranes moist Eyes: conjunctiva non-erythematous, extraocular movements intact Cardiovascular: regular rate and rhythm, no m/r/g Pulmonary/Chest: normal  work of breathing on room air, lungs clear to auscultation bilaterally Abdominal: soft, mildly tender to palpation, non-distended MSK: normal bulk and tone Neurological: alert & oriented x 3 Skin: warm and dry Psych: normal behavior, normal affect  Pertinent Labs, Studies, and Procedures:  CBC Latest Ref Rng & Units 07/16/2021 07/15/2021 07/14/2021  WBC 4.0 - 10.5 K/uL 6.4 9.4 14.4(H)  Hemoglobin 12.0 - 15.0 g/dL 11.6(L) 10.9(L) 12.5  Hematocrit 36.0 - 46.0 % 34.7(L) 32.3(L) 37.9  Platelets 150 - 400 K/uL 166 149(L) 174    CMP Latest Ref Rng & Units 07/16/2021 07/15/2021 07/14/2021  Glucose 70 - 99 mg/dL 542(H) 062(B) 762(G)  BUN 8 - 23 mg/dL 31(D) 17(O) 16(W)  Creatinine 0.44 - 1.00 mg/dL 7.37 1.06 2.69(S)  Sodium 135 - 145 mmol/L 134(L) 134(L) 133(L)  Potassium 3.5 - 5.1 mmol/L 3.8 4.2 4.8  Chloride 98 - 111 mmol/L 98 101 98  CO2 22 - 32 mmol/L 26 25 25   Calcium 8.9 - 10.3 mg/dL ) 8.5(I) 9.1  Total Protein 6.5 - 8.1 g/dL - - 7.2  Total Bilirubin 0.3 - 1.2 mg/dL - - 1.1  Alkaline Phos 38 - 126 U/L - - 56  AST 15 - 41 U/L - - 24  ALT 0 - 44 U/L - - 15    CT HEAD WO CONTRAST (6.2(V)  Result Date: 07/14/2021 CLINICAL DATA:  Mental status change.   Patient found on floor. EXAM: CT HEAD WITHOUT CONTRAST TECHNIQUE: Contiguous axial images were obtained from the base of the skull through the vertex without intravenous contrast. COMPARISON:  January 11, 2017 FINDINGS: Brain: No subdural, epidural, or subarachnoid hemorrhage. 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 Cerebellum, brainstem, and basal cisterns are normal. No mass effect or midline shift. Ventricles and sulci are unremarkable. No acute cortical ischemia or infarct. Vascular: Calcified atherosclerosis in the intracranial carotids. Skull: Normal. Negative for fracture or focal lesion. Sinuses/Orbits: No acute finding. Other: None. IMPRESSION: 1. No acute intracranial abnormalities. No cause for the patient's symptoms identified. Electronically Signed   By: January 13, 2017 III M.D.   On: 07/14/2021 14:45   MR BRAIN WO CONTRAST  Result Date: 07/14/2021 CLINICAL DATA:  Neuro deficit, acute, stroke suspected. Confusion. Found on the ground. EXAM: MRI HEAD WITHOUT CONTRAST TECHNIQUE: Multiplanar, multiecho pulse sequences of the brain and surrounding structures were obtained without intravenous contrast. COMPARISON:  Head CT earlier same day.  MRI 10/16/2016. FINDINGS: Brain: Diffusion imaging does not show any acute or subacute infarction. No abnormality affects the brainstem. 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 Minimal small vessel change of the cerebral hemispheric white matter. No cortical or large vessel territory infarction. No mass lesion, hemorrhage, hydrocephalus or extra-axial collection. Vascular: Major vessels at the base of the brain show flow. Skull and upper cervical spine: Negative Sinuses/Orbits: Clear/normal Other: None IMPRESSION: No acute finding. 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 Minimal small vessel ischemic change of the cerebral hemispheric white matter. Electronically Signed    By: 10/18/2016 M.D.   On: 07/14/2021 18:36   CT ABDOMEN PELVIS W CONTRAST  Result Date: 07/15/2021 CLINICAL DATA:  Acute right lower quadrant abdominal pain. EXAM: CT ABDOMEN AND PELVIS WITH CONTRAST TECHNIQUE: Multidetector CT imaging of the abdomen and pelvis was performed using the standard protocol following bolus administration of intravenous contrast. CONTRAST:  4mL OMNIPAQUE IOHEXOL 350 MG/ML SOLN COMPARISON:  October 13, 2016. FINDINGS: Lower chest: Minimal  right pleural effusion is noted with minimal adjacent subsegmental atelectasis. Hepatobiliary: No focal liver abnormality is seen. Status post cholecystectomy. No biliary dilatation. Pancreas: Unremarkable. No pancreatic ductal dilatation or surrounding inflammatory changes. Spleen: Normal in size without focal abnormality. Adrenals/Urinary Tract: Adrenal glands appear normal. Nonobstructive right renal calculus is noted. Bilateral renal cysts are noted. Mild right hydronephrosis is noted secondary to 6 mm calculus in proximal right ureter. Urinary bladder is unremarkable. Stomach/Bowel: Stomach is within normal limits. Appendix appears normal. No evidence of bowel wall thickening, distention, or inflammatory changes. Vascular/Lymphatic: Status post aortobifemoral bypass graft placement. No significant adenopathy is noted. Reproductive: Uterus and bilateral adnexa are unremarkable. Other: Small fat containing left inguinal hernia is noted. No ascites is noted. Musculoskeletal: No acute or significant osseous findings. IMPRESSION: Mild right hydronephrosis is noted secondary to 6 mm calculus in proximal right ureter. Nonobstructive right renal calculus is noted. Minimal right pleural effusion is noted with adjacent subsegmental atelectasis. Status post aortofemoral bypass graft placement. Aortic Atherosclerosis (ICD10-I70.0). Electronically Signed   By: Lupita Raider M.D.   On: 07/15/2021 13:19   DG Retrograde Pyelogram  Result Date:  07/16/2021 CLINICAL DATA:  Obstructing right ureteral calculus EXAM: RETROGRADE PYELOGRAM COMPARISON:  CT from earlier the same day FINDINGS: A series of fluoroscopic spot images document retrograde opacification of the right renal collecting system and ureter. Possible filling defect in the proximal ureter just beyond the UPJ. There is mild irregularity possibly spasm in the proximal ureter. There is mild hydronephrosis. Final images document placement of double-J ureteral stent. IMPRESSION: Retrograde right ureteral stent placement Electronically Signed   By: Corlis Leak M.D.   On: 07/16/2021 07:37   DG Chest Portable 1 View  Result Date: 07/14/2021 CLINICAL DATA:  Altered mental status EXAM: PORTABLE CHEST 1 VIEW COMPARISON:  07/28/2016 FINDINGS: Patient is slightly rotated. Post CABG changes. Stable heart size. Atherosclerotic calcification of the aortic knob. No focal airspace consolidation, pleural effusion, or pneumothorax. IMPRESSION: No active disease. Electronically Signed   By: Duanne Guess D.O.   On: 07/14/2021 14:12     Signed: [facility], MD Internal Medicine Resident, PGY-1 [facility]  Pager: [phone] 11:31 AM, 07/16/2021

## 2021-07-16 NOTE — Hospital Course (Addendum)
Sepsis 2/2 urinary source  Acute metabolic encephalology  Resolved encephalopathy after starting ABX, in setting of CXR, CT head, MRI head negative for acute changes. Pt A&O x 3. CT abdomen show mild right hydronephrosis secondary to 6 mm calculus in proximal right ureter. Urology consulted, performed cytoscopy with right ureteral stent placement. Pt remains afebrile. Blood cultures show no growth at day 2, and urine culture show proteus. Completed 3 days of rocephin. WBC improving 14.4 admission ->  6.4 on discharge. Discharged on Keflex 500 q6h for 4 days. Plan for definitive stone treatment in 2 to 3 weeks as an outpatient, per urology.   Coronary artery disease s/p CABG x4 HTN HLD Patient underwent CABG in 2017, no complications noted. Patient is not having any chest pain, palpitations, or shortness of breath. She takes aspirin, rosuvastatin, and carvedilol at home.  - Continue home meds - Cardiac monitoring

## 2021-07-16 NOTE — Progress Notes (Signed)
Folly catheter removed, patient voided. IV removed. Discharge instructions given to patient. Patient discharged

## 2021-07-16 NOTE — Progress Notes (Signed)
Occupational Therapy Evaluation Patient Details Name: Nicole Dean MRN: [id number] DOB: [birth year] Today's Date: 07/16/2021    History of Present Illness Nicole Dean is a 80 y.o. female to the emergency department after a fall at home, found down, admitted for management of acute metabolic encephalopathy with sepsis secondary to acute UTI. Pt is s/p Cystoscopy with right ureteral stent placement 8/15. PMH: HTN, HLD, CAD s/p CABG in 2017, and AAA s/p repair in 2017   Clinical Impression   Nicole Dean was evaluated s/p the above admission list. PTA pt was indep in all ADLs, does not drive due to macular degeneration. She lives alone in a 1 level home with 5 STE, she has 2 supportive sons and neighbors who can provide 24/7 assist if needed. Upon evaluation, pt was supervision A-min guard for all mobility without AD. She is currently supervision-set up A for ADLs. Home safety and fall prevention education provided and pt verbalized understanding. No further OT required. Recommend pt d/c home with 24/7 supervision initially.    Follow Up Recommendations  No OT follow up;Supervision/Assistance - 24 hour    Equipment Recommendations  None recommended by OT       Precautions / Restrictions Precautions Precautions: Fall      Mobility Bed Mobility Overal bed mobility: Modified Independent                  Transfers Overall transfer level: Needs assistance   Transfers: Sit to/from Stand Sit to Stand: Supervision         General transfer comment: supervision for safety only this session, once standing, pt was slow to move due to soreness of bottom and being stiff from the bed.    Balance Overall balance assessment: Needs assistance Sitting-balance support: Feet supported Sitting balance-Leahy Scale: Good     Standing balance support: No upper extremity supported Standing balance-Leahy Scale: Fair                             ADL either performed or assessed with  clinical judgement   ADL Overall ADL's : Needs assistance/impaired Eating/Feeding: Independent;Sitting   Grooming: Set up;Standing   Upper Body Bathing: Set up;Sitting   Lower Body Bathing: Set up;Sit to/from stand   Upper Body Dressing : Set up;Sitting   Lower Body Dressing: Supervision/safety;Sit to/from stand   Toilet Transfer: Supervision/safety;Ambulation;Regular Toilet;Grab bars   Toileting- Clothing Manipulation and Hygiene: Supervision/safety;Sit to/from stand       Functional mobility during ADLs: Supervision/safety General ADL Comments: supervision this session for safety. set up due to macular degeneration     Vision Baseline Vision/History: Wears glasses;Macular Degeneration;Legally blind Wears Glasses: At all times Patient Visual Report: No change from baseline (Pt does not have glasses with her, they were left at home. 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)              Pertinent Vitals/Pain Pain Assessment: Faces Pain Score: 0-No pain Faces Pain Scale: No hurt Pain Intervention(s): Monitored during session     Hand Dominance Right   Extremity/Trunk Assessment Upper Extremity Assessment Upper Extremity Assessment: Generalized weakness;Overall [facility] for tasks assessed   Lower Extremity Assessment Lower Extremity Assessment: Defer to PT evaluation   Cervical / Trunk Assessment Cervical / Trunk Assessment: Normal   Communication Communication Communication: No difficulties   Cognition Arousal/Alertness: Awake/alert Behavior During Therapy: WFL for tasks assessed/performed Overall Cognitive Status: Within Functional Limits for tasks assessed  General Comments  VSS on RA, cathetar intact            Home Living Family/patient expects to be discharged to:: Private residence Living Arrangements: Alone Available Help at Discharge: Family;Friend(s);Available 24 hours/day (Son plans to stay with her, he works during the day but her neighbor will  stay with her if needed while he is out.) Type of Home: House Home Access: Stairs to enter [employer] of Steps: 5 Entrance Stairs-Rails: Can reach both Home Layout: One level     Bathroom Shower/Tub: Chief Strategy Officer: Standard     Home Equipment: Environmental consultant - 2 wheels;Shower seat;Bedside commode;Wheelchair - manual;Grab bars - tub/shower          Prior Functioning/Environment Level of Independence: Independent                 OT Problem List: Decreased activity tolerance;Decreased safety awareness;Decreased knowledge of precautions;Pain         OT Goals(Current goals can be found in the care plan section) Acute Rehab OT Goals Patient Stated Goal: home today OT Goal Formulation: All assessment and education complete, DC therapy   AM-PAC OT "6 Clicks" Daily Activity     Outcome Measure Help from another person eating meals?: None Help from another person taking care of personal grooming?: A Little Help from another person toileting, which includes using toliet, bedpan, or urinal?: A Little Help from another person bathing (including washing, rinsing, drying)?: A Little Help from another person to put on and taking off regular upper body clothing?: None Help from another person to put on and taking off regular lower body clothing?: A Little 6 Click Score: 20   End of Session Nurse Communication: Mobility status;Weight bearing status;Precautions  Activity Tolerance: No increased pain;Patient tolerated treatment well Patient left: in chair;with call bell/phone within reach;with chair alarm set  OT Visit Diagnosis: History of falling (Z91.81);Muscle weakness (generalized) (M62.81);Pain                Time: 7782-4235 OT Time Calculation (min): 22 min Charges:  OT General Charges $OT Visit: 1 Visit OT Evaluation $OT Eval Moderate Complexity: 1 Mod  Waldo Damian A Emelee Rodocker 07/16/2021, 10:21 AM

## 2021-07-16 NOTE — Evaluation (Signed)
Physical Therapy Evaluation Patient Details Name: Nicole Dean MRN: [id number] DOB: September 16, 1941 Today's Date: 07/16/2021   History of Present Illness  Nicole Dean is a 80 y.o. female to the emergency department after a fall at home, found down, admitted for management of acute metabolic encephalopathy with sepsis secondary to acute UTI. Pt is s/p Cystoscopy with right ureteral stent placement 8/15. PMH: HTN, HLD, CAD s/p CABG in 2017, and AAA s/p repair in 2017   Clinical Impression  Pt admitted with above. Pt mildly unsteady requiring minA for safe ambulation prompting use of RW. Pt reports she feels more secure and is notably more steady. Pt reports she has a RW at home and that her son will stay with her. Recommend HHPT however pt deferred. Acute PT to cont to follow.    Follow Up Recommendations No PT follow up;Supervision/Assistance - 24 hour (son to spend the night a few days, pt declined HHPT, recommend pt to use her RW - pt agreed)    Equipment Recommendations  None recommended by PT (pt has RW)    Recommendations for Other Services       Precautions / Restrictions Precautions Precautions: Fall Precaution Comments: pt with macular degeneration, can not see close Restrictions Weight Bearing Restrictions: No      Mobility  Bed Mobility               General bed mobility comments: pt received sitting EOB upon PT arrival    Transfers Overall transfer level: Needs assistance Equipment used: None Transfers: Sit to/from Stand Sit to Stand: Min guard         General transfer comment: min guard for safety, pt with wide base of support reaching for object to hold onto in front of her  Ambulation/Gait Ambulation/Gait assistance: Min assist;Min guard Gait Distance (Feet): 120 Feet (x1, 60x1) Assistive device: Rolling walker (2 wheeled);1 person hand held assist Gait Pattern/deviations: Step-through pattern;Decreased stride length;Wide base of support Gait  velocity: dec Gait velocity interpretation: <1.31 ft/sec, indicative of household ambulator General Gait Details: pt initially deferred use of RW, pt reaching for objects/wall to hold onto with wide base of support, pt given PTs hand for HHA on the L. Pt cluching PT's hand, PT providing minA as pt unsteady. Pt then given RW and was min guard/supervision, pt reports "I feel more secure". Encouraged pt to use RW when she gets home until she is stronger  Information systems manager [place] (Stroke Patients Only)       Balance Overall balance assessment: Needs assistance Sitting-balance support: Feet supported Sitting balance-Leahy Scale: Good     Standing balance support: No upper extremity supported Standing balance-Leahy Scale: Poor Standing balance comment: required physical assist for safe amb                             Pertinent Vitals/Pain Pain Assessment: No/denies pain Faces Pain Scale: No hurt    Home Living Family/patient expects to be discharged to:: Private residence Living Arrangements: Alone Available Help at Discharge: Family;Friend(s);Available 24 hours/day (Son plans to stay with her, he works during the day but her neighbor will stay with her if needed while he is out.) Type of Home: House Home Access: Stairs to enter Entrance Stairs-Rails: Can reach both Entrance Stairs-Number of Steps: 5 Home Layout: One level Home Equipment: Walker - 2 wheels;Shower seat;Bedside  commode;Wheelchair - manual;Grab bars - tub/shower      Prior Function Level of Independence: Independent         Comments: doesn't drive, family does grocery shopping     Hand Dominance   Dominant Hand: Right    Extremity/Trunk Assessment   Upper Extremity Assessment Upper Extremity Assessment: Generalized weakness    Lower Extremity Assessment Lower Extremity Assessment: Generalized weakness    Cervical / Trunk Assessment Cervical /  Trunk Assessment: Normal  Communication   Communication: No difficulties  Cognition Arousal/Alertness: Awake/alert Behavior During Therapy: WFL for tasks assessed/performed Overall Cognitive Status: Within Functional Limits for tasks assessed                                        General Comments General comments (skin integrity, edema, etc.): pt with noted SOB, 2/4 DOE, pt reports this to be normal    Exercises     Assessment/Plan    PT Assessment Patient needs continued PT services  PT Problem List Decreased strength;Decreased activity tolerance;Decreased balance;Decreased mobility;Decreased coordination;Decreased knowledge of use of DME       PT Treatment Interventions DME instruction;Gait training;Stair training;Functional mobility training;Therapeutic activities;Therapeutic exercise;Balance training    PT Goals (Current goals can be found in the Care Plan section)  Acute Rehab PT Goals Patient Stated Goal: home today PT Goal Formulation: With patient Time For Goal Achievement: 07/30/21 Potential to Achieve Goals: Good    Frequency Min 3X/week   Barriers to discharge        Co-evaluation               AM-PAC PT "6 Clicks" Mobility  Outcome Measure Help needed turning from your back to your side while in a flat bed without using bedrails?: None Help needed moving from lying on your back to sitting on the side of a flat bed without using bedrails?: None Help needed moving to and from a bed to a chair (including a wheelchair)?: None Help needed standing up from a chair using your arms (e.g., wheelchair or bedside chair)?: None Help needed to walk in hospital room?: A Little Help needed climbing 3-5 steps with a railing? : A Little 6 Click Score: 22    End of Session Equipment Utilized During Treatment: Gait belt Activity Tolerance: Patient tolerated treatment well Patient left: in chair;with call bell/phone within reach;with chair alarm  set Nurse Communication: Mobility status PT Visit Diagnosis: Difficulty in walking, not elsewhere classified (R26.2)    Time: 1111-1130 PT Time Calculation (min) (ACUTE ONLY): 19 min   Charges:   PT Evaluation $PT Eval Moderate Complexity: 1 Mod          Lewis Shock, PT, DPT Acute Rehabilitation Services Pager #: [phone] Office #: [phone]   Iona Hansen 07/16/2021, 1:55 PM

## 2021-07-17 ENCOUNTER — Telehealth (HOSPITAL_BASED_OUTPATIENT_CLINIC_OR_DEPARTMENT_OTHER): Payer: Self-pay

## 2021-07-17 LAB — BLOOD CULTURE ID PANEL (REFLEXED) - BCID2

## 2021-07-17 NOTE — Telephone Encounter (Signed)
Nightshift RN received call for + BC 1 of 4 bottles gram - rods with enterobacterales proteus detected with no resistance. This RN advised to call patient during daytime hours to follow up with patient to see how she was doing. Spoke with patient and patient daughter who reports that patient is leaking urine and has appointment with urology for follow up today at 1:45 pm. Advised patient to keep appointment and follow up at [facility] ED if still not feeling well. Pt and family agree.

## 2021-07-18 ENCOUNTER — Other Ambulatory Visit: Payer: Self-pay | Admitting: Urology

## 2021-07-19 LAB — CULTURE, BLOOD (ROUTINE X 2): Culture: NO GROWTH

## 2021-07-23 NOTE — Patient Instructions (Addendum)
DUE TO COVID-19 ONLY ONE VISITOR IS ALLOWED TO COME WITH YOU AND STAY IN THE WAITING ROOM ONLY DURING PRE OP AND PROCEDURE.   **NO VISITORS ARE ALLOWED IN THE SHORT STAY AREA OR RECOVERY ROOM!!**   Your procedure is scheduled on:    Report to [facility] Main  Entrance    Report to admitting at 9:00 AM   Call this number if you have problems the morning of surgery [phone]   Do not eat food :After Midnight.   May have liquids until  8:15 AM  day of surgery  CLEAR LIQUID DIET  Foods Allowed                                                                     Foods Excluded  Water, Black Coffee and tea (NO milk or creamer)           liquids that you cannot  Plain Jell-O in any flavor  (No red)                                    see through such as: Fruit ices (not with fruit pulp)                                            milk, soups, orange juice              Iced Popsicles (No red)                                               All solid food                                   Apple juices Sports drinks like Gatorade (No red) Lightly seasoned clear broth or consume(fat free) Sugar, honey syrup    Oral Hygiene is also important to reduce your risk of infection.                                    Remember - BRUSH YOUR TEETH THE MORNING OF SURGERY WITH YOUR REGULAR TOOTHPASTE  Take these medicines the morning of surgery with A SIP OF WATER: Tylenol, Inhalers, Carvedilol, Ciprofloxacin, Lorazepam, Prilosec, Oxybutynin, Crestor                              You may not have any metal on your body including hair pins, jewelry, and body piercing             Do not wear make-up, lotions, powders, perfumes, or deodorant  Do not wear nail polish including gel and S&S, artificial/acrylic nails, or any other type of covering on natural nails including finger and toenails. If you have artificial nails, gel  coating, etc. that needs to be removed by a nail salon please have this  removed prior to surgery or surgery may need to be canceled/ delayed if the surgeon/ anesthesia feels like they are unable to be safely monitored.   Do not shave  48 hours prior to surgery.    Do not bring valuables to the hospital. [facility] IS NOT             RESPONSIBLE   FOR VALUABLES.   Contacts, dentures or bridgework may not be worn into surgery.    Patients discharged the day of surgery will not be allowed to drive home.   Please read over the following fact sheets you were given: IF YOU HAVE QUESTIONS ABOUT YOUR PRE OP INSTRUCTIONS PLEASE CALL [phone]-Nicole Dean   [facility] - Preparing for Surgery Before surgery, you can play an important role.  Because skin is not sterile, your skin needs to be as free of germs as possible.  You can reduce the number of germs on your skin by washing with CHG (chlorahexidine gluconate) soap before surgery.  CHG is an antiseptic cleaner which kills germs and bonds with the skin to continue killing germs even after washing. Please DO NOT use if you have an allergy to CHG or antibacterial soaps.  If your skin becomes reddened/irritated stop using the CHG and inform your nurse when you arrive at Short Stay. Do not shave (including legs and underarms) for at least 48 hours prior to the first CHG shower.  You may shave your face/neck.  Please follow these instructions carefully:  1.  Shower with CHG Soap the night before surgery and the  morning of surgery.  2.  If you choose to wash your hair, wash your hair first as usual with your normal  shampoo.  3.  After you shampoo, rinse your hair and body thoroughly to remove the shampoo.                             4.  Use CHG as you would any other liquid soap.  You can apply chg directly to the skin and wash.  Gently with a scrungie or clean washcloth.  5.  Apply the CHG Soap to your body ONLY FROM THE NECK DOWN.   Do   not use on face/ open                           Wound or open sores. Avoid contact  with eyes, ears mouth and   genitals (private parts).                       Wash face,  Genitals (private parts) with your normal soap.             6.  Wash thoroughly, paying special attention to the area where your    surgery  will be performed.  7.  Thoroughly rinse your body with warm water from the neck down.  8.  DO NOT shower/wash with your normal soap after using and rinsing off the CHG Soap.                9.  Pat yourself dry with a clean towel.            10.  Wear clean pajamas.            11.  Place clean sheets on your bed the night of your first shower and do not  sleep with pets. Day of Surgery : Do not apply any lotions/deodorants the morning of surgery.  Please wear clean clothes to the hospital/surgery center.  FAILURE TO FOLLOW THESE INSTRUCTIONS MAY RESULT IN THE CANCELLATION OF YOUR SURGERY  PATIENT SIGNATURE_________________________________  NURSE SIGNATURE__________________________________  ________________________________________________________________________

## 2021-07-23 NOTE — Progress Notes (Addendum)
COVID swab appointment: N/a  COVID Vaccine Completed:yes x2 Date COVID Vaccine completed: 01/26/20 02/21/20 Has received booster: COVID vaccine manufacturer: Pfizer       Date of COVID positive in last 90 days: No  PCP - Charlies Silvers, PA-C Cardiologist - Laqueta Carina, MD  Clearance?? Priamry knows, cards does not know  Chest x-ray - 07/14/21 Epic EKG - 07/15/21 Epic Stress Test - 03/26/16 Epic ECHO - 10/14/16 Epic Cardiac Cath - 03/31/16 Epic Pacemaker/ICD device last checked: N/a Spinal Cord Stimulator: N/a  Sleep Study - N/a CPAP -   Fasting Blood Sugar - N/a Checks Blood Sugar _____ times a day  Blood Thinner Instructions: Aspirin Instructions: ASA 81 Last Dose: patient seeing PCP Friday and will ask about instructions  Activity level:  Can go up a flight of stairs and perform activities of daily living without stopping and without symptoms of chest pain or shortness of breath. Difficulty with vision      Anesthesia review: CAD, AAA, HTN, SOB (prior to cardiac cath), CABG, sepsis  due to UTI 07/14/21  Patient denies shortness of breath, fever, cough and chest pain at PAT appointment   Patient verbalized understanding of instructions that were given to them at the PAT appointment. Patient was also instructed that they will need to review over the PAT instructions again at home before surgery.

## 2021-07-24 ENCOUNTER — Encounter (HOSPITAL_COMMUNITY)
Admission: RE | Admit: 2021-07-24 | Discharge: 2021-07-24 | Disposition: A | Discharge status: Home / Self Care | Payer: Medicare HMO | Source: Ambulatory Visit | Attending: Urology | Admitting: Urology

## 2021-07-24 ENCOUNTER — Encounter (HOSPITAL_COMMUNITY): Payer: Self-pay

## 2021-07-24 ENCOUNTER — Other Ambulatory Visit: Payer: Self-pay

## 2021-07-24 DIAGNOSIS — Z01818 Encounter for other preprocedural examination: Secondary | ICD-10-CM | POA: Insufficient documentation

## 2021-07-24 HISTORY — DX: Personal history of urinary calculi: Z87.442

## 2021-07-25 NOTE — Progress Notes (Signed)
Anesthesia Chart Review   Case: [id number] Date/Time: 07/31/21 1100   Procedure: CYSTOSCOPY/URETEROSCOPY/HOLMIUM LASER/STENT PLACEMENT (Right) - ONLY NEEDS 45 MIN   Anesthesia type: General   Pre-op diagnosis: RIGHT URETERAL STONE   Location: [facility] PROCEDURE [facility] / [facility]   Surgeons: Rene Paci, MD       DISCUSSION:80 y.o. former smoker with h/o HTN, GERD, CAD (CABG 2017), AAA s/p repair with Ao- bi fem graft. 07/22/16, right ureteral stone scheduled for above procedure 07/31/2021 with Dr. Rhoderick Moody.   Pt last seen 07/11/21, stable at this visit.    S/p cystoscopy 07/15/2021  during recent admission for sepsis secondary to UTI, no anesthesia complications noted.  VS: BP (!) 147/74   Pulse 62   Temp 36.4 C (Oral)   Resp 12   Ht 5' (1.524 m)   Wt 56.2 kg   SpO2 94%   BMI 24.22 kg/m   PROVIDERS: Couillard, Victorino Dike, PA-C is PCP   Kristeen Miss, MD is Cardiologist  LABS: Labs reviewed: Acceptable for surgery. (all labs ordered are listed, but only abnormal results are displayed)  Labs Reviewed - No data to display   IMAGES:   EKG: 07/15/2021 Rate 92 bpm  Sinus rhythm  Borderline T abnormalities, anterior leads  CV: Echo 10/14/2016 Study Conclusions   - Left ventricle: The cavity size was normal. Wall thickness was    increased in a pattern of mild LVH. Systolic function was normal.    The estimated ejection fraction was in the range of 60% to 65%.    There is hypokinesis of the basalinferior myocardium. Doppler    parameters are consistent with abnormal left ventricular    relaxation (grade 1 diastolic dysfunction).  - Aortic valve: There was mild regurgitation.   Impressions:   - Hypokinesis of the basal inferior wall with overall preserved LV    systolic function; grade 1 diastolic dysfunction; sclerotic    aortic valve with mild AI; trace MR and TR.  Past Medical History:  Diagnosis Date   AAA (abdominal aortic aneurysm) (HCC)    VVS-  following    Allergy    Anemia    Anxiety    Arthritis    hands   Blood transfusion without reported diagnosis    Cataract    removed bilateral    Cholecystitis 10/2016   Coronary artery disease    Depression    Gastric ulcer 05/27/2017   Dr Adela Lank   GERD (gastroesophageal reflux disease)    History of bronchitis    History of kidney stones    History of pneumonia    Hyperlipidemia    Hypertension    Numbness and tingling of right leg    Shortness of breath dyspnea    sometimes even with resting; pt. denies since heart surgery   Wears glasses     Past Surgical History:  Procedure Laterality Date   ABDOMINAL AORTIC ANEURYSM REPAIR N/A 07/22/2016   Procedure: JUXTARENAL  ABDOMINAL AORTIC ANEURYSM REPAIR;  Surgeon: Chuck Hint, MD;  Location: [facility] OR;  Service: Vascular;  Laterality: N/A;   AORTA - BILATERAL FEMORAL ARTERY BYPASS GRAFT N/A 07/22/2016   Procedure: AORTA BIFEMORAL BYPASS GRAFT;  Surgeon: Chuck Hint, MD;  [facility];  Service: Vascular;  Laterality: N/A;   CABG x 4     CARDIAC CATHETERIZATION N/A 03/31/2016   Procedure: Left Heart Cath and Coronary Angiography;  Surgeon: Peter M Swaziland, MD;  Location: [facility] INVASIVE CV LAB;  Service: Cardiovascular;  Laterality: N/A;   CHOLECYSTECTOMY N/A 10/15/2016   Procedure: LAPAROSCOPIC CONVERTED TO OPEN CHOLECYSTECTOMY;  Surgeon: Claud Kelp, MD;  Location: [facility] OR;  Service: General;  Laterality: N/A;   CHOLECYSTECTOMY, LAPAROSCOPIC  10/15/2016   COLONOSCOPY     CORONARY ARTERY BYPASS GRAFT N/A 04/04/2016   Procedure: CORONARY ARTERY BYPASS GRAFTING (CABG) x 4 using bilateral internal mammary arteries and right saphenous vein harvested by endovein;  Surgeon: Alleen Borne, MD;  Location: [facility] OR;  Service: Open Heart Surgery;  Laterality: N/A;   CYSTOSCOPY W/ URETERAL STENT PLACEMENT Right 07/15/2021   Procedure: CYSTOSCOPY WITH RETROGRADE PYELOGRAM/URETERAL STENT PLACEMENT;  Surgeon: Rene Paci, MD;  Location: [facility] OR;  Service: Urology;  Laterality: Right;   EYE SURGERY Bilateral    cataracts removed./ IOL   PERIPHERAL VASCULAR CATHETERIZATION N/A 03/03/2016   Procedure: Abdominal Aortogram w/Lower Extremity;  Surgeon: Chuck Hint, MD;  Location: [facility] INVASIVE CV LAB;  Service: Cardiovascular;  Laterality: N/A;   REPAIR ILIAC ARTERY Right 07/22/2016   Procedure: RIGHT COMMON ILIAC ANEURYSM REPAIR;  Surgeon: Chuck Hint, MD;  Location: [facility] OR;  Service: Vascular;  Laterality: Right;   SHOULDER ARTHROSCOPY W/ ROTATOR CUFF REPAIR Right    TEE WITHOUT CARDIOVERSION N/A 04/04/2016   Procedure: TRANSESOPHAGEAL ECHOCARDIOGRAM (TEE);  Surgeon: Alleen Borne, MD;  Location: [facility] OR;  Service: Open Heart Surgery;  Laterality: N/A;   UPPER GASTROINTESTINAL ENDOSCOPY      MEDICATIONS:  acetaminophen (TYLENOL) 325 MG tablet   albuterol (VENTOLIN HFA) 108 (90 Base) MCG/ACT inhaler   aspirin EC 81 MG tablet   carvedilol (COREG) 6.25 MG tablet   ciprofloxacin (CIPRO) 250 MG tablet   enalapril (VASOTEC) 20 MG tablet   hydrochlorothiazide (HYDRODIURIL) 25 MG tablet   LORazepam (ATIVAN) 1 MG tablet   Multiple Vitamins-Iron (MULTIVITAMIN/IRON PO)   Multiple Vitamins-Minerals (PRESERVISION AREDS 2 PO)   omeprazole (PRILOSEC) 40 MG capsule   oxybutynin (DITROPAN) 5 MG tablet   oxyCODONE-acetaminophen (PERCOCET/ROXICET) 5-325 MG tablet   rosuvastatin (CRESTOR) 20 MG tablet   senna (SENOKOT) 8.6 MG TABS tablet   sertraline (ZOLOFT) 100 MG tablet   traZODone (DESYREL) 100 MG tablet   triamcinolone ointment (KENALOG) 0.1 %   No current facility-administered medications for this encounter.    Jodell Cipro Ward, PA-C [facility] [phone]

## 2021-07-30 NOTE — Anesthesia Preprocedure Evaluation (Addendum)
Anesthesia Evaluation  Patient identified by MRN, date of birth, ID band Patient awake    Reviewed: Allergy & Precautions, NPO status , Patient's Chart, lab work & pertinent test results  Airway Mallampati: I  TM Distance: >3 FB Neck ROM: Full    Dental no notable dental hx. (+) Edentulous Upper, Edentulous Lower   Pulmonary former smoker,    Pulmonary exam normal breath sounds clear to auscultation       Cardiovascular hypertension, Pt. on medications + CAD, + CABG and + Peripheral Vascular Disease  Normal cardiovascular exam Rhythm:Regular Rate:Normal  10/2016 Echo Left ventricle: The cavity size was normal. Wall thickness was  increased in a pattern of mild LVH. Systolic function was normal.  The estimated ejection fraction was in the range of 60% to 65%.  There is hypokinesis of the basalinferior myocardium. Doppler  parameters are consistent with abnormal left ventricular  relaxation (grade 1 diastolic dysfunction).  - Aortic valve: There was mild regurgitation.   Impressions:   - Hypokinesis of the basal inferior wall with overall preserved LV  systolic function; grade 1 diastolic dysfunction; sclerotic  aortic valve with mild AI; trace MR and TR.    Neuro/Psych Anxiety    GI/Hepatic PUD, GERD  ,  Endo/Other    Renal/GU Renal disease     Musculoskeletal  (+) Arthritis ,   Abdominal   Peds  Hematology Lab Results      Component                Value               Date                      WBC                      6.4                 07/16/2021                HGB                      11.6 (L)            07/16/2021                HCT                      34.7 (L)            07/16/2021                MCV                      96.7                07/16/2021                PLT                      166                 07/16/2021              Anesthesia Other Findings All: Codeine PCN   Reproductive/Obstetrics  Anesthesia Evaluation  Patient identified by MRN, date of birth, ID band Patient awake    Reviewed: Allergy & Precautions, NPO status , Patient's Chart, lab work & pertinent test results  Airway Mallampati: II  TM Distance: >3 FB Neck ROM: Full    Dental  (+) Edentulous Upper, Edentulous Lower   Pulmonary neg pulmonary ROS, former smoker,    Pulmonary exam normal breath sounds clear to auscultation       Cardiovascular Exercise Tolerance: Good hypertension, Pt. on medications + CAD, + CABG (2017) and + Peripheral Vascular Disease (aorta-bifem)  Normal cardiovascular exam Rhythm:Regular Rate:Normal  EKG reviewed: NSR rate 90s   Neuro/Psych PSYCHIATRIC DISORDERS Anxiety Depression negative neurological ROS     GI/Hepatic Neg liver ROS, PUD, GERD  ,  Endo/Other  negative endocrine ROS  Renal/GU negative Renal ROS  negative genitourinary   Musculoskeletal  (+) Arthritis , Osteoarthritis,    Abdominal   Peds negative pediatric ROS (+)  Hematology  (+) anemia ,   Anesthesia Other Findings   Reproductive/Obstetrics negative OB ROS                            Anesthesia Physical Anesthesia Plan  ASA: 3 and emergent  Anesthesia Plan: General   Post-op Pain Management:    Induction:   PONV Risk Score and Plan: Treatment may vary due to age or medical condition and Ondansetron  Airway Management Planned: Oral ETT  Additional Equipment: None  Intra-op Plan:   Post-operative Plan: Extubation in OR  Informed Consent: I have reviewed the patients History and Physical, chart, labs and discussed the procedure including the risks, benefits and alternatives for the proposed anesthesia with the patient or authorized representative who has indicated his/her understanding and acceptance.       Plan Discussed with:  Anesthesiologist and CRNA  Anesthesia Plan Comments:        Anesthesia Quick Evaluation  Anesthesia Physical Anesthesia Plan  ASA: 3  Anesthesia Plan: General   Post-op Pain Management:    Induction: Intravenous  PONV Risk Score and Plan: 3 and Treatment may vary due to age or medical condition and Ondansetron  Airway Management Planned: LMA  Additional Equipment: None  Intra-op Plan:   Post-operative Plan:   Informed Consent: I have reviewed the patients History and Physical, chart, labs and discussed the procedure including the risks, benefits and alternatives for the proposed anesthesia with the patient or authorized representative who has indicated his/her understanding and acceptance.     Dental advisory given  Plan Discussed with: CRNA and Anesthesiologist  Anesthesia Plan Comments:         Anesthesia Quick Evaluation

## 2021-07-31 ENCOUNTER — Ambulatory Visit (HOSPITAL_COMMUNITY): Payer: Medicare HMO | Admitting: Physician Assistant

## 2021-07-31 ENCOUNTER — Encounter (HOSPITAL_COMMUNITY): Payer: Self-pay | Admitting: Urology

## 2021-07-31 ENCOUNTER — Ambulatory Visit (HOSPITAL_COMMUNITY): Payer: Medicare HMO | Admitting: Anesthesiology

## 2021-07-31 ENCOUNTER — Ambulatory Visit (HOSPITAL_COMMUNITY): Payer: Medicare HMO

## 2021-07-31 ENCOUNTER — Encounter (HOSPITAL_COMMUNITY)
Admission: RE | Disposition: A | Discharge status: Home / Self Care | Payer: Self-pay | Source: Ambulatory Visit | Attending: Urology

## 2021-07-31 ENCOUNTER — Ambulatory Visit (HOSPITAL_COMMUNITY)
Admission: RE | Admit: 2021-07-31 | Discharge: 2021-07-31 | Disposition: A | Discharge status: Home / Self Care | Payer: Medicare HMO | Source: Ambulatory Visit | Attending: Urology | Admitting: Urology

## 2021-07-31 DIAGNOSIS — Z880 Allergy status to penicillin: Secondary | ICD-10-CM | POA: Insufficient documentation

## 2021-07-31 DIAGNOSIS — N136 Pyonephrosis: Secondary | ICD-10-CM | POA: Insufficient documentation

## 2021-07-31 DIAGNOSIS — Z9049 Acquired absence of other specified parts of digestive tract: Secondary | ICD-10-CM | POA: Diagnosis not present

## 2021-07-31 DIAGNOSIS — I2510 Atherosclerotic heart disease of native coronary artery without angina pectoris: Secondary | ICD-10-CM | POA: Diagnosis not present

## 2021-07-31 DIAGNOSIS — I10 Essential (primary) hypertension: Secondary | ICD-10-CM | POA: Insufficient documentation

## 2021-07-31 DIAGNOSIS — E785 Hyperlipidemia, unspecified: Secondary | ICD-10-CM | POA: Insufficient documentation

## 2021-07-31 DIAGNOSIS — Z87891 Personal history of nicotine dependence: Secondary | ICD-10-CM | POA: Diagnosis not present

## 2021-07-31 DIAGNOSIS — I714 Abdominal aortic aneurysm, without rupture: Secondary | ICD-10-CM | POA: Insufficient documentation

## 2021-07-31 DIAGNOSIS — Z800 Family history of malignant neoplasm of digestive organs: Secondary | ICD-10-CM | POA: Diagnosis not present

## 2021-07-31 DIAGNOSIS — Z8249 Family history of ischemic heart disease and other diseases of the circulatory system: Secondary | ICD-10-CM | POA: Insufficient documentation

## 2021-07-31 DIAGNOSIS — J90 Pleural effusion, not elsewhere classified: Secondary | ICD-10-CM | POA: Insufficient documentation

## 2021-07-31 DIAGNOSIS — I700 Atherosclerosis of aorta: Secondary | ICD-10-CM | POA: Diagnosis not present

## 2021-07-31 DIAGNOSIS — Z8711 Personal history of peptic ulcer disease: Secondary | ICD-10-CM | POA: Diagnosis not present

## 2021-07-31 DIAGNOSIS — Z885 Allergy status to narcotic agent status: Secondary | ICD-10-CM | POA: Diagnosis not present

## 2021-07-31 DIAGNOSIS — Z951 Presence of aortocoronary bypass graft: Secondary | ICD-10-CM | POA: Insufficient documentation

## 2021-07-31 HISTORY — PX: CYSTOSCOPY/URETEROSCOPY/HOLMIUM LASER/STENT PLACEMENT: SHX6546

## 2021-07-31 SURGERY — CYSTOSCOPY/URETEROSCOPY/HOLMIUM LASER/STENT PLACEMENT
Anesthesia: General | Laterality: Right

## 2021-07-31 MED ORDER — CIPROFLOXACIN IN D5W 400 MG/200ML IV SOLN
400.0000 mg | INTRAVENOUS | Status: AC
  Administered 2021-07-31: 400 mg via INTRAVENOUS
  Filled 2021-07-31: qty 200

## 2021-07-31 MED ORDER — CIPROFLOXACIN HCL 250 MG PO TABS: 250.0000 mg | ORAL_TABLET | Freq: Two times a day (BID) | ORAL | 0 refills | Status: AC

## 2021-07-31 MED ORDER — EPHEDRINE 5 MG/ML INJ
INTRAVENOUS | Status: AC
  Filled 2021-07-31: qty 5

## 2021-07-31 MED ORDER — SODIUM CHLORIDE 0.9 % IR SOLN
Status: DC | PRN
  Administered 2021-07-31: 3000 mL

## 2021-07-31 MED ORDER — ACETAMINOPHEN 10 MG/ML IV SOLN: 1000.0000 mg | Freq: Once | INTRAVENOUS | Status: DC | PRN

## 2021-07-31 MED ORDER — KETOROLAC TROMETHAMINE 10 MG PO TABS: 10.0000 mg | ORAL_TABLET | Freq: Four times a day (QID) | ORAL | 0 refills | Status: AC | PRN

## 2021-07-31 MED ORDER — FENTANYL CITRATE PF 50 MCG/ML IJ SOSY: 25.0000 ug | PREFILLED_SYRINGE | INTRAMUSCULAR | Status: DC | PRN

## 2021-07-31 MED ORDER — CHLORHEXIDINE GLUCONATE 0.12 % MT SOLN
15.0000 mL | Freq: Once | OROMUCOSAL | Status: AC
  Administered 2021-07-31: 15 mL via OROMUCOSAL

## 2021-07-31 MED ORDER — LIDOCAINE 2% (20 MG/ML) 5 ML SYRINGE
INTRAMUSCULAR | Status: AC
  Filled 2021-07-31: qty 5

## 2021-07-31 MED ORDER — FENTANYL CITRATE (PF) 100 MCG/2ML IJ SOLN
INTRAMUSCULAR | Status: DC | PRN
  Administered 2021-07-31 (×4): 25 ug via INTRAVENOUS

## 2021-07-31 MED ORDER — BELLADONNA ALKALOIDS-OPIUM 16.2-60 MG RE SUPP
RECTAL | Status: DC | PRN
  Administered 2021-07-31: 1 via RECTAL

## 2021-07-31 MED ORDER — KETOROLAC TROMETHAMINE 15 MG/ML IJ SOLN
INTRAMUSCULAR | Status: DC | PRN
  Administered 2021-07-31: 15 mg via INTRAVENOUS

## 2021-07-31 MED ORDER — LACTATED RINGERS IV SOLN: INTRAVENOUS | Status: DC

## 2021-07-31 MED ORDER — ORAL CARE MOUTH RINSE: 15.0000 mL | Freq: Once | OROMUCOSAL | Status: AC

## 2021-07-31 MED ORDER — OXYCODONE-ACETAMINOPHEN 5-325 MG PO TABS: 1.0000 | ORAL_TABLET | ORAL | 0 refills | Status: AC | PRN

## 2021-07-31 MED ORDER — DEXAMETHASONE SODIUM PHOSPHATE 10 MG/ML IJ SOLN
INTRAMUSCULAR | Status: DC | PRN
  Administered 2021-07-31: 5 mg via INTRAVENOUS

## 2021-07-31 MED ORDER — KETOROLAC TROMETHAMINE 30 MG/ML IJ SOLN
INTRAMUSCULAR | Status: AC
  Filled 2021-07-31: qty 1

## 2021-07-31 MED ORDER — PROPOFOL 10 MG/ML IV BOLUS
INTRAVENOUS | Status: DC | PRN
  Administered 2021-07-31: 130 mg via INTRAVENOUS

## 2021-07-31 MED ORDER — LIDOCAINE 2% (20 MG/ML) 5 ML SYRINGE
INTRAMUSCULAR | Status: DC | PRN
  Administered 2021-07-31: 100 mg via INTRAVENOUS

## 2021-07-31 MED ORDER — BELLADONNA ALKALOIDS-OPIUM 16.2-30 MG RE SUPP
RECTAL | Status: AC
  Filled 2021-07-31: qty 1

## 2021-07-31 MED ORDER — FENTANYL CITRATE (PF) 100 MCG/2ML IJ SOLN
INTRAMUSCULAR | Status: AC
  Filled 2021-07-31: qty 2

## 2021-07-31 MED ORDER — ONDANSETRON HCL 4 MG/2ML IJ SOLN
INTRAMUSCULAR | Status: DC | PRN
  Administered 2021-07-31: 4 mg via INTRAVENOUS

## 2021-07-31 MED ORDER — PROPOFOL 10 MG/ML IV BOLUS
INTRAVENOUS | Status: AC
  Filled 2021-07-31: qty 20

## 2021-07-31 MED ORDER — EPHEDRINE SULFATE-NACL 50-0.9 MG/10ML-% IV SOSY
PREFILLED_SYRINGE | INTRAVENOUS | Status: DC | PRN
  Administered 2021-07-31 (×2): 5 mg via INTRAVENOUS

## 2021-07-31 MED ORDER — ONDANSETRON HCL 4 MG/2ML IJ SOLN: 4.0000 mg | Freq: Once | INTRAMUSCULAR | Status: DC | PRN

## 2021-07-31 MED ORDER — ONDANSETRON HCL 4 MG/2ML IJ SOLN
INTRAMUSCULAR | Status: AC
  Filled 2021-07-31: qty 2

## 2021-07-31 SURGICAL SUPPLY — 21 items
BAG URO CATCHER STRL LF (MISCELLANEOUS) ×2 IMPLANT
BASKET ZERO TIP NITINOL 2.4FR (BASKET) IMPLANT
CATH URET 5FR 28IN OPEN ENDED (CATHETERS) ×2 IMPLANT
CLOTH BEACON ORANGE TIMEOUT ST (SAFETY) ×2 IMPLANT
COVER DOME SNAP 22 D (MISCELLANEOUS) ×2 IMPLANT
EXTRACTOR STONE NITINOL NGAGE (UROLOGICAL SUPPLIES) IMPLANT
GLOVE SURG ENC TEXT LTX SZ7.5 (GLOVE) ×2 IMPLANT
GOWN STRL REUS W/TWL XL LVL3 (GOWN DISPOSABLE) ×2 IMPLANT
GUIDEWIRE STR DUAL SENSOR (WIRE) IMPLANT
GUIDEWIRE ZIPWRE .038 STRAIGHT (WIRE) ×4 IMPLANT
KIT TURNOVER KIT A (KITS) ×2 IMPLANT
LASER FIB FLEXIVA PULSE ID 365 (Laser) IMPLANT
MANIFOLD NEPTUNE II (INSTRUMENTS) ×2 IMPLANT
PACK CYSTO (CUSTOM PROCEDURE TRAY) ×2 IMPLANT
SHEATH URETERAL 12FRX35CM (MISCELLANEOUS) IMPLANT
STENT URET 6FRX24 CONTOUR (STENTS) ×2 IMPLANT
STENT URET 6FRX26 CONTOUR (STENTS) IMPLANT
TRACTIP FLEXIVA PULS ID 200XHI (Laser) ×1 IMPLANT
TRACTIP FLEXIVA PULSE ID 200 (Laser) ×2
TUBING CONNECTING 10 (TUBING) ×2 IMPLANT
TUBING UROLOGY SET (TUBING) ×2 IMPLANT

## 2021-07-31 NOTE — Transfer of Care (Signed)
Immediate Anesthesia Transfer of Care Note  Patient: Nicole Dean  Procedure(s) Performed: CYSTOSCOPY/URETEROSCOPY/HOLMIUM LASER/STENT PLACEMENT (Right)  Patient Location: PACU  Anesthesia Type:General  Level of Consciousness: awake, alert  and oriented  Airway & Oxygen Therapy: Patient Spontanous Breathing and Patient connected to face mask oxygen  Post-op Assessment: Report given to RN and Post -op Vital signs reviewed and stable  Post vital signs: Reviewed and stable  Last Vitals:  Vitals Value Taken Time  BP 154/65 07/31/21 1256  Temp    Pulse 68 07/31/21 1257  Resp 14 07/31/21 1257  SpO2 100 % 07/31/21 1257  Vitals shown include unvalidated device data.  Last Pain:  Vitals:   07/31/21 0953  TempSrc: Oral  PainSc: 8       Patients Stated Pain Goal: 4 (07/31/21 0953)  Complications: No notable events documented.

## 2021-07-31 NOTE — Anesthesia Procedure Notes (Signed)
Procedure Name: LMA Insertion Date/Time: 07/31/2021 12:14 PM Performed by: Florene Route, CRNA Patient Re-evaluated:Patient Re-evaluated prior to induction Oxygen Delivery Method: Circle system utilized Preoxygenation: Pre-oxygenation with 100% oxygen Induction Type: IV induction LMA: LMA inserted LMA Size: 3.0 Number of attempts: 1 Placement Confirmation: positive ETCO2 and breath sounds checked- equal and bilateral Tube secured with: Tape Dental Injury: Teeth and Oropharynx as per pre-operative assessment

## 2021-07-31 NOTE — Anesthesia Postprocedure Evaluation (Signed)
Anesthesia Post Note  Patient: Nicole Dean  Procedure(s) Performed: CYSTOSCOPY/URETEROSCOPY/HOLMIUM LASER/STENT PLACEMENT (Right)     Patient location during evaluation: PACU Anesthesia Type: General Level of consciousness: sedated and patient cooperative Pain management: pain level controlled Vital Signs Assessment: post-procedure vital signs reviewed and stable Respiratory status: spontaneous breathing Cardiovascular status: stable Anesthetic complications: no   No notable events documented.  Last Vitals:  Vitals:   07/31/21 1330 07/31/21 1400  BP: (!) 158/67 (!) 160/68  Pulse: 66 65  Resp: 15   Temp: 36.6 C   SpO2: 93% 95%    Last Pain:  Vitals:   07/31/21 1400  TempSrc:   PainSc: 0-No pain                 Lewie Loron

## 2021-07-31 NOTE — H&P (Signed)
Urology Preoperative H&P   Chief Complaint: Right ureteral stone  History of Present Illness: Nicole Dean is a 80 y.o. female with a history of urosepsis secondary to an obstructing right proximal ureteral stone and Proteus UTI.  She is status post right ureteral stent placement on 07/15/2021.  Postoperatively, the patient is struggled with stent discomfort when voiding and has required a prolonged course of antibiotic coverage via Cipro.  She is here today for definitive stone treatment.  She denies interval fevers/chills or gross hematuria.   Past Medical History:  Diagnosis Date   AAA (abdominal aortic aneurysm) (HCC)    VVS- following    Allergy    Anemia    Anxiety    Arthritis    hands   Blood transfusion without reported diagnosis    Cataract    removed bilateral    Cholecystitis 10/2016   Coronary artery disease    Depression    Gastric ulcer 05/27/2017   Dr Adela Lank   GERD (gastroesophageal reflux disease)    History of bronchitis    History of kidney stones    History of pneumonia    Hyperlipidemia    Hypertension    Numbness and tingling of right leg    Shortness of breath dyspnea    sometimes even with resting; pt. denies since heart surgery   Wears glasses     Past Surgical History:  Procedure Laterality Date   ABDOMINAL AORTIC ANEURYSM REPAIR N/A 07/22/2016   Procedure: JUXTARENAL  ABDOMINAL AORTIC ANEURYSM REPAIR;  Surgeon: Chuck Hint, MD;  Location: [facility] OR;  Service: Vascular;  Laterality: N/A;   AORTA - BILATERAL FEMORAL ARTERY BYPASS GRAFT N/A 07/22/2016   Procedure: AORTA BIFEMORAL BYPASS GRAFT;  Surgeon: Chuck Hint, MD;  Location: [facility] OR;  Service: Vascular;  Laterality: N/A;   CABG x 4     CARDIAC CATHETERIZATION N/A 03/31/2016   Procedure: Left Heart Cath and Coronary Angiography;  Surgeon: Peter M Swaziland, MD;  Location: [facility] INVASIVE CV LAB;  Service: Cardiovascular;  Laterality: N/A;   CHOLECYSTECTOMY N/A 10/15/2016   Procedure:  LAPAROSCOPIC CONVERTED TO OPEN CHOLECYSTECTOMY;  Surgeon: Claud Kelp, MD;  Location: [facility] OR;  Service: General;  Laterality: N/A;   CHOLECYSTECTOMY, LAPAROSCOPIC  10/15/2016   COLONOSCOPY     CORONARY ARTERY BYPASS GRAFT N/A 04/04/2016   Procedure: CORONARY ARTERY BYPASS GRAFTING (CABG) x 4 using bilateral internal mammary arteries and right saphenous vein harvested by endovein;  Surgeon: Alleen Borne, MD;  Location: [facility] OR;  Service: Open Heart Surgery;  Laterality: N/A;   CYSTOSCOPY W/ URETERAL STENT PLACEMENT Right 07/15/2021   Procedure: CYSTOSCOPY WITH RETROGRADE PYELOGRAM/URETERAL STENT PLACEMENT;  Surgeon: Rene Paci, MD;  Location: [facility] OR;  Service: Urology;  Laterality: Right;   EYE SURGERY Bilateral    cataracts removed./ IOL   PERIPHERAL VASCULAR CATHETERIZATION N/A 03/03/2016   Procedure: Abdominal Aortogram w/Lower Extremity;  Surgeon: Chuck Hint, MD;  Location: [facility] INVASIVE CV LAB;  Service: Cardiovascular;  Laterality: N/A;   REPAIR ILIAC ARTERY Right 07/22/2016   Procedure: RIGHT COMMON ILIAC ANEURYSM REPAIR;  Surgeon: Chuck Hint, MD;  Location: [facility] OR;  Service: Vascular;  Laterality: Right;   SHOULDER ARTHROSCOPY W/ ROTATOR CUFF REPAIR Right    TEE WITHOUT CARDIOVERSION N/A 04/04/2016   Procedure: TRANSESOPHAGEAL ECHOCARDIOGRAM (TEE);  Surgeon: Alleen Borne, MD;  Location: [facility] OR;  Service: Open Heart Surgery;  Laterality: N/A;   UPPER GASTROINTESTINAL ENDOSCOPY      Allergies:  Allergies  Allergen Reactions   Codeine Nausea And Vomiting    Severe, ended up in ED   Penicillins Nausea Only and Other (See Comments)    Has patient had a PCN reaction causing immediate rash, facial/tongue/throat swelling, SOB or lightheadedness with hypotension: Yes Has patient had a PCN reaction causing severe rash involving mucus membranes or skin necrosis: No Has patient had a PCN reaction that required hospitalization No Has patient had a PCN reaction  occurring within the last 10 years: No If all of the above answers are "NO", then may proceed with Cephalosporin use.     Family History  Problem Relation Age of Onset   Cancer Father        stomach   Stomach cancer Father    Heart disease Maternal Grandfather    Stomach cancer Brother    Heart disease Maternal Uncle    Colon polyps Brother    Colon cancer Neg Hx    Esophageal cancer Neg Hx    Rectal cancer Neg Hx     Social History:  reports that she quit smoking about 25 years ago. Her smoking use included cigarettes. She has a 52.50 pack-year smoking history. She has never used smokeless tobacco. She reports that she does not drink alcohol and does not use drugs.  ROS: A complete review of systems was performed.  All systems are negative except for pertinent findings as noted.  Physical Exam:  Vital signs in last 24 hours: Temp:  [98.3 F (36.8 C)] 98.3 F (36.8 C) (08/31 0953) Pulse Rate:  [64] 64 (08/31 0953) Resp:  [15] 15 (08/31 0953) BP: (133)/(63) 133/63 (08/31 0953) SpO2:  [95 %] 95 % (08/31 0953) Constitutional:  Alert and oriented, No acute distress Cardiovascular: Regular rate and rhythm, No JVD Respiratory: Normal respiratory effort, Lungs clear bilaterally GI: Abdomen is soft, nontender, nondistended, no abdominal masses GU: No CVA tenderness Lymphatic: No lymphadenopathy Neurologic: Grossly intact, no focal deficits Psychiatric: Normal mood and affect  Laboratory Data:  No results for input(s): WBC, HGB, HCT, PLT in the last 72 hours.  No results for input(s): NA, K, CL, GLUCOSE, BUN, CALCIUM, CREATININE in the last 72 hours.  Invalid input(s): CO3   No results found for this or any previous visit (from the past 24 hour(s)). No results found for this or any previous visit (from the past 240 hour(s)).  Renal Function: No results for input(s): CREATININE in the last 168 hours. Estimated Creatinine Clearance: 39.8 mL/min (by C-G formula based on SCr  of 0.9 mg/dL).  Radiologic Imaging: CLINICAL DATA:  Acute right lower quadrant abdominal pain.   EXAM: CT ABDOMEN AND PELVIS WITH CONTRAST   TECHNIQUE: Multidetector CT imaging of the abdomen and pelvis was performed using the standard protocol following bolus administration of intravenous contrast.   CONTRAST:  84mL OMNIPAQUE IOHEXOL 350 MG/ML SOLN   COMPARISON:  October 13, 2016.   FINDINGS: Lower chest: Minimal right pleural effusion is noted with minimal adjacent subsegmental atelectasis.   Hepatobiliary: No focal liver abnormality is seen. Status post cholecystectomy. No biliary dilatation.   Pancreas: Unremarkable. No pancreatic ductal dilatation or surrounding inflammatory changes.   Spleen: Normal in size without focal abnormality.   Adrenals/Urinary Tract: Adrenal glands appear normal. Nonobstructive right renal calculus is noted. Bilateral renal cysts are noted. Mild right hydronephrosis is noted secondary to 6 mm calculus in proximal right ureter. Urinary bladder is unremarkable.   Stomach/Bowel: Stomach is within normal limits. Appendix appears normal. No evidence  of bowel wall thickening, distention, or inflammatory changes.   Vascular/Lymphatic: Status post aortobifemoral bypass graft placement. No significant adenopathy is noted.   Reproductive: Uterus and bilateral adnexa are unremarkable.   Other: Small fat containing left inguinal hernia is noted. No ascites is noted.   Musculoskeletal: No acute or significant osseous findings.   IMPRESSION: Mild right hydronephrosis is noted secondary to 6 mm calculus in proximal right ureter. Nonobstructive right renal calculus is noted.   Minimal right pleural effusion is noted with adjacent subsegmental atelectasis.   Status post aortofemoral bypass graft placement.   Aortic Atherosclerosis (ICD10-I70.0).     Electronically Signed   By: Lupita Raider M.D.   On: 07/15/2021 13:19  I independently  reviewed the above imaging studies.  Assessment and Plan Nicole Dean is a 80 y.o. female with a 6 mm right ureteral calculus and recent proteus UTI  The risks, benefits and alternatives of cystoscopy with RIGHT ureteroscopy, laser lithotripsy and ureteral stent placement was discussed the patient.  Risks included, but are not limited to: bleeding, urinary tract infection, ureteral injury/avulsion, ureteral stricture formation, retained stone fragments, the possibility that multiple surgeries may be required to treat the stone(s), MI, stroke, PE and the inherent risks of general anesthesia.  The patient voices understanding and wishes to proceed.     Rhoderick Moody, MD 07/31/2021, 11:05 AM  [facility] Pager: [phone]

## 2021-07-31 NOTE — Op Note (Signed)
Operative Note  Preoperative diagnosis:  1.  6 mm right ureteral stone  Postoperative diagnosis: 1.  6 mm right renal stone  Procedure(s): 1.  Cystoscopy with right ureteroscopy, holmium laser lithotripsy and right ureteral stent exchange  Surgeon: Rhoderick Moody, MD  Assistants:  None  Anesthesia:  General  Complications:  None  EBL: Less than 5 mL  Specimens: 1.  Previously placed right JJ stent was removed intact, inspected and discarded  Drains/Catheters: 1.  6 French, 24 cm JJ stent with tether  Intraoperative findings:   Her proximal right ureteral stone had migrated into the renal pelvis following stent placement  Indication:  Nicole Dean is a 80 y.o. female with a history of urosepsis secondary to an obstructing right ureteral stone and Proteus UTI.  She underwent urgent stent placement on 07/10/2021 and is here today for definitive stone treatment.  She has been consented for the above procedures, voices understanding and wishes to proceed.  Description of procedure:  After informed consent was obtained, the patient was brought to the operating room and general LMA anesthesia was administered. The patient was then placed in the dorsolithotomy position and prepped and draped in the usual sterile fashion. A timeout was performed. A 23 French rigid cystoscope was then inserted into the urethral meatus and advanced into the bladder under direct vision. A complete bladder survey revealed no intravesical pathology.  Her previously placed right JJ stent was then grasped at its distal curl and retracted to the urethral meatus.  A Glidewire was then used to intubate the lumen of the stent and was advanced up to the right renal pelvis, or fluoroscopic guidance.  Her previously placed stent was then removed intact, inspected and discarded.  A flexible ureteroscope was then advanced alongside the wire and up to the right renal pelvis where her 6 mm stone was identified.  A  200 m holmium laser was then used to dust the stone into 2 mm or less fragments.  The flexible ureteroscope was then removed under direct vision, revealing no evidence of ureteral trauma or significant ureteral stone debris.  A 6 French, 24 cm JJ stent was then advanced over the wire and into good position within the right collecting system, confirming placement via fluoroscopy.  The tether the stent was left intact.  The patient's bladder was drained.  She tolerated the procedure well and was transferred to the postanesthesia in stable condition.  Plan: The patient has been instructed to remove her stent at 7 AM on 08/06/2021.

## 2021-08-01 ENCOUNTER — Encounter (HOSPITAL_COMMUNITY): Payer: Self-pay | Admitting: Urology

## 2022-06-23 ENCOUNTER — Encounter (INDEPENDENT_AMBULATORY_CARE_PROVIDER_SITE_OTHER): Payer: Medicare HMO | Admitting: Ophthalmology

## 2022-06-23 DIAGNOSIS — H43813 Vitreous degeneration, bilateral: Secondary | ICD-10-CM

## 2022-06-23 DIAGNOSIS — H353114 Nonexudative age-related macular degeneration, right eye, advanced atrophic with subfoveal involvement: Secondary | ICD-10-CM

## 2022-06-23 DIAGNOSIS — I10 Essential (primary) hypertension: Secondary | ICD-10-CM

## 2022-06-23 DIAGNOSIS — H35033 Hypertensive retinopathy, bilateral: Secondary | ICD-10-CM | POA: Diagnosis not present

## 2022-06-23 DIAGNOSIS — H353122 Nonexudative age-related macular degeneration, left eye, intermediate dry stage: Secondary | ICD-10-CM | POA: Diagnosis not present

## 2022-07-13 ENCOUNTER — Encounter: Payer: Self-pay | Admitting: Cardiovascular Disease

## 2022-07-13 NOTE — Progress Notes (Signed)
Cardiology Office Note   Date:  07/14/2022   ID:  Nicole Dean, DOB 09-23-1941, MRN [id number]  PCP:  Nicole Silvers, PA-C  Cardiologist:   Nicole Miss, MD   Chief Complaint  Patient presents with   Hypertension        Coronary Artery Disease         Problem List 1. CAD - s/p CABG  04/04/16  Left internal mammary graft to the LAD Right internal mammary graft to the PDA SVG to diagonal        SVG to OM 2.  AAA - s/p repair with Ao- bi fem graft. 07/22/16 Nicole Dean 2. Essential HTN 3. Hyperlipidemia    Previous notes:  Nicole Dean is a 81 y.o. female who presents for pre-op eval prior to AAA repair Was seen with daughter , Nicole Dean .  She has known about this AAA for a while,   Was offered repair but she declined - too much going on Recently found that the AAA had enlarged Does not have any abdominal pain  - has bilateral groin pain with walking   Has leg pain and weakness with climbing stairs.  No real CP or dyspnea   Former smoker - quit 17 year ago .   June 04, 2016: She has had CABG by Dr. Laneta Dean on May 5. She appears to be doing quite well. No CP , breathing is good.  She saw Nicole Dean. PA  on May 30. Her HCTZ was stopped at that time but Nicole Dean developed significant leg edema. The HCTZ has been restarted and she seems to be doing quite well.  Still has not had herr AAA repaired yet  Dec. 8, 2017:  Nicole Dean is seen with son, Nicole Dean .  CABG in May Had AAA repair in Aug Nicole Dean surgery Nov. 15, 2017 Nicole Dean )  Had  a wound vac .   Still draining.     February 23, 2017:  Overall doing well .   BP is well controlled. Still healing from her GB surgery  No CP ,  Mar 31, 2018:  Nicole Dean is seen for follow-up of her coronary artery disease, abdominal aortic aneurysm repair, hyperlipidemia, hypertension. Doing well. Getting some exercise  Still has some tenderness in her Nicole Dean surgery scar .  Had to heal secondarily   June 21, 2019:  Nicole Dean is seen today follow-up for follow-up of her coronary artery disease, abdominal aortic aneurysm repair, hyperlipidemia, hypertension.  Seems to be getting along well now .  Wt today is 135 lbs.    July 04, 2020: Nicole Dean is seen today for follow-up of her coronary artery bypass grafting (May, 2017), abdominal aortic aneurysm repair (August, 2017) ypertension, hyperlipidemia.  No cp,   Some dyspnea,  Usus albuterol on occasion .  Walks regularly ,    Aug. 14, 2023 Nicole Dean is seen for follow up of her CAD, CABG, AAA repiar, HTN, HLD  Doing well  No cp, no dysnea   Llipid panel from April 2022 - [facility] system LDL is 91 Total cholesterol is 157 Triglyceride level is 222 HDL is 45.  Past Medical History:  Diagnosis Date   AAA (abdominal aortic aneurysm) (HCC)    VVS- following    Allergy    Anemia    Anxiety    Arthritis    hands   Blood transfusion without reported diagnosis    Cataract    removed bilateral    Cholecystitis 10/2016  Coronary artery disease    Depression    Gastric ulcer 05/27/2017   Dr Nicole Dean   GERD (gastroesophageal reflux disease)    History of bronchitis    History of kidney stones    History of pneumonia    Hyperlipidemia    Hypertension    Numbness and tingling of right leg    Shortness of breath dyspnea    sometimes even with resting; pt. denies since heart surgery   Wears glasses     Past Surgical History:  Procedure Laterality Date   ABDOMINAL AORTIC ANEURYSM REPAIR N/A 07/22/2016   Procedure: JUXTARENAL  ABDOMINAL AORTIC ANEURYSM REPAIR;  Surgeon: Nicole Hint, MD;  Location: [facility] OR;  Service: Vascular;  Laterality: N/A;   AORTA - BILATERAL FEMORAL ARTERY BYPASS GRAFT N/A 07/22/2016   Procedure: AORTA BIFEMORAL BYPASS GRAFT;  Surgeon: Nicole Hint, MD;  Location: [facility] OR;  Service: Vascular;  Laterality: N/A;   CABG x 4     CARDIAC CATHETERIZATION N/A 03/31/2016   Procedure: Left Heart Cath and Coronary Angiography;   Surgeon: Nicole M Swazil[place], MD;  Location: [facility] INVASIVE CV LAB;  Service: Cardiovascular;  Laterality: N/A;   CHOLECYSTECTOMY N/A 10/15/2016   Procedure: LAPAROSCOPIC CONVERTED TO OPEN CHOLECYSTECTOMY;  Surgeon: Nicole Kelp, MD;  Location: [facility] OR;  Service: General;  Laterality: N/A;   CHOLECYSTECTOMY, LAPAROSCOPIC  10/15/2016   COLONOSCOPY     CORONARY ARTERY BYPASS GRAFT N/A 04/04/2016   Procedure: CORONARY ARTERY BYPASS GRAFTING (CABG) x 4 using bilateral internal mammary arteries and right saphenous vein harvested by endovein;  Surgeon: Nicole Borne, MD;  Location: [facility] OR;  Service: Open Heart Surgery;  Laterality: N/A;   CYSTOSCOPY W/ URETERAL STENT PLACEMENT Right 07/15/2021   Procedure: CYSTOSCOPY WITH RETROGRADE PYELOGRAM/URETERAL STENT PLACEMENT;  Surgeon: Nicole Paci, MD;  Location: [facility] OR;  Service: Urology;  Laterality: Right;   CYSTOSCOPY/URETEROSCOPY/HOLMIUM LASER/STENT PLACEMENT Right 07/31/2021   Procedure: CYSTOSCOPY/URETEROSCOPY/HOLMIUM LASER/STENT PLACEMENT;  Surgeon: Nicole Paci, MD;  Location: [facility] ORS;  Service: Urology;  Laterality: Right;  ONLY NEEDS 45 MIN   EYE SURGERY Bilateral    cataracts removed./ IOL   PERIPHERAL VASCULAR CATHETERIZATION N/A 03/03/2016   Procedure: Abdominal Aortogram w/Lower Extremity;  Surgeon: Nicole Hint, MD;  Location: [facility] INVASIVE CV LAB;  Service: Cardiovascular;  Laterality: N/A;   REPAIR ILIAC ARTERY Right 07/22/2016   Procedure: RIGHT COMMON ILIAC ANEURYSM REPAIR;  Surgeon: Nicole Hint, MD;  Location: [facility] OR;  Service: Vascular;  Laterality: Right;   SHOULDER ARTHROSCOPY W/ ROTATOR CUFF REPAIR Right    TEE WITHOUT CARDIOVERSION N/A 04/04/2016   Procedure: TRANSESOPHAGEAL ECHOCARDIOGRAM (TEE);  Surgeon: Nicole Borne, MD;  Location: [facility] OR;  Service: Open Heart Surgery;  Laterality: N/A;   UPPER GASTROINTESTINAL ENDOSCOPY       Current Outpatient Medications  Medication Sig Dispense Refill    acetaminophen (TYLENOL) 325 MG tablet Take 2 tablets (650 mg total) by mouth every 4 (four) hours as needed for mild pain, moderate pain or fever.     aspirin EC 81 MG tablet Take 1 tablet (81 mg total) by mouth daily. Swallow whole. 30 tablet 11   carvedilol (COREG) 6.25 MG tablet TAKE 1 TABLET BY MOUTH TWICE A DAY 60 tablet 6   enalapril (VASOTEC) 20 MG tablet Take 20 mg by mouth daily.     hydrochlorothiazide (HYDRODIURIL) 25 MG tablet Take 25 mg by mouth daily.     LORazepam (ATIVAN) 1 MG tablet Take 1 mg  by mouth 2 (two) times daily.     Multiple Vitamins-Iron (MULTIVITAMIN/IRON PO) Take 1 tablet by mouth daily.     omeprazole (PRILOSEC) 40 MG capsule Take 40 mg by mouth daily.     oxybutynin (DITROPAN) 5 MG tablet Take 5 mg by mouth 3 (three) times daily.     rosuvastatin (CRESTOR) 20 MG tablet Take 1 tablet (20 mg total) by mouth daily. 90 tablet 3   senna (SENOKOT) 8.6 MG TABS tablet Take 1 tablet (8.6 mg total) by mouth 2 (two) times daily. 30 each 0   sertraline (ZOLOFT) 100 MG tablet Take 200 mg by mouth daily.     traZODone (DESYREL) 100 MG tablet Take 100 mg by mouth at bedtime.     albuterol (VENTOLIN HFA) 108 (90 Base) MCG/ACT inhaler Inhale 1 puff into the lungs every 6 (six) hours as needed for shortness of breath. (Patient not taking: Reported on 07/14/2022)     ketorolac (TORADOL) 10 MG tablet Take 1 tablet (10 mg total) by mouth every 6 (six) hours as needed. (Patient not taking: Reported on 07/14/2022) 20 tablet 0   Multiple Vitamins-Minerals (PRESERVISION AREDS 2 PO) Take 1 capsule by mouth in the morning and at bedtime. (Patient not taking: Reported on 07/14/2022)     oxyCODONE-acetaminophen (PERCOCET/ROXICET) 5-325 MG tablet Take 1 tablet by mouth every 4 (four) hours as needed. (Patient not taking: Reported on 07/14/2022) 12 tablet 0   triamcinolone ointment (KENALOG) 0.1 % Apply 1 application topically 3 (three) times daily as needed (poison oak). (Patient not taking: Reported  on 07/14/2022)     No current facility-administered medications for this visit.    Allergies:   Codeine and Penicillins    Social History:  The patient  reports that she quit smoking about 26 years ago. Her smoking use included cigarettes. She has a 52.50 pack-year smoking history. She has never used smokeless tobacco. She reports that she does not drink alcohol and does not use drugs.   Family History:  The patient's family history includes Cancer in her father; Colon polyps in her brother; Heart disease in her maternal grandfather and maternal uncle; Stomach cancer in her brother and father.    ROS:   Noted in current history, otherwise review of systems is negative.      EKG:    Recent Labs: 07/14/2021: ALT 15; TSH 1.813 07/16/2021: BUN 27; Creatinine, Ser 0.90; Hemoglobin 11.6; Platelets 166; Potassium 3.8; Sodium 134    Lipid Panel    Component Value Date/Time   CHOL 160 07/04/2020 0940   TRIG 193 (H) 07/04/2020 0940   HDL 41 07/04/2020 0940   CHOLHDL 3.9 07/04/2020 0940   CHOLHDL 4.7 09/01/2012 1429   VLDL 60 (H) 09/01/2012 1429   LDLCALC 86 07/04/2020 0940   LDLDIRECT 82 03/23/2009 2152      Wt Readings from Last 3 Encounters:  07/14/22 130 lb 6.4 oz (59.1 kg)  07/24/21 124 lb (56.2 kg)  07/15/21 130 lb (59 kg)      Other studies Reviewed: Additional studies/ records that were reviewed today include: . Review of the above records demonstrates:    ASSESSMENT AND PLAN:  1.  AAA -     S/p repair.      2. CAD :   She had an abnormal Myoview study and subsequent cardiac catheterization revealed significant coronary artery disease. She's now status post coronary artery bypass grafting in 2017 Left internal mammary graft to the LAD Right internal mammary graft  to the PDA SVG to diagonal SVG to OM   She denies having any angina.  Continue current medications.   3. Essential hypertension:    Blood pressure is well controlled today.   4.   Hyperlipidemia :   .  Her last LDL was from April, 2022 in the [facility] system.  LDL still 91.  Her LDL goal is now 55.  We will recheck labs today.  We will consider adding Zetia or perhaps referring her to the lipid clinic for consideration of PCSK9 inhibitor if her LDL remains elevated.   Current medicines are reviewed at length with the patient today.  The patient does not have concerns regarding medicines.  The following changes have been made:  no change  Labs/ tests ordered today include:   Orders Placed This Encounter  Procedures   ALT   Basic metabolic panel   Lipid panel   EKG 12-Lead     Disposition:        Nicole Miss, MD  07/14/2022 9:58 AM    [facility] [address] [place], [place], [place]  [address] Phone: [phone]; Fax: [phone]

## 2022-07-14 ENCOUNTER — Encounter: Payer: Self-pay | Admitting: Cardiovascular Disease

## 2022-07-14 ENCOUNTER — Ambulatory Visit (INDEPENDENT_AMBULATORY_CARE_PROVIDER_SITE_OTHER): Payer: Medicare HMO | Admitting: Cardiovascular Disease

## 2022-07-14 VITALS — BP 118/58 | HR 66 | Ht 62.0 in | Wt 130.4 lb

## 2022-07-14 DIAGNOSIS — E782 Mixed hyperlipidemia: Secondary | ICD-10-CM | POA: Diagnosis not present

## 2022-07-14 DIAGNOSIS — I2510 Atherosclerotic heart disease of native coronary artery without angina pectoris: Secondary | ICD-10-CM | POA: Diagnosis not present

## 2022-07-14 LAB — LIPID PANEL
Chol/HDL Ratio: 3.6 ratio (ref 0.0–4.4)
Cholesterol, Total: 160 mg/dL (ref 100–199)
HDL: 45 mg/dL (ref >39)
LDL Chol Calc (NIH): 86 mg/dL (ref 0–99)
Triglycerides: 171 mg/dL — ABNORMAL HIGH (ref 0–149)
VLDL Cholesterol Cal: 29 mg/dL (ref 5–40)

## 2022-07-14 LAB — BASIC METABOLIC PANEL
BUN/Creatinine Ratio: 25 (ref 12–28)
BUN: 19 mg/dL (ref 8–27)
CO2: 22 mmol/L (ref 20–29)
Calcium: 9.2 mg/dL (ref 8.7–10.3)
Chloride: 106 mmol/L (ref 96–106)
Creatinine, Ser: 0.76 mg/dL (ref 0.57–1.00)
Glucose: 112 mg/dL — ABNORMAL HIGH (ref 70–99)
Potassium: 4.6 mmol/L (ref 3.5–5.2)
Sodium: 139 mmol/L (ref 134–144)
eGFR: 79 mL/min/{1.73_m2} (ref >59)

## 2022-07-14 LAB — ALT: ALT: 24 IU/L (ref 0–32)

## 2022-07-14 NOTE — Patient Instructions (Signed)
Medication Instructions:  Your physician recommends that you continue on your current medications as directed. Please refer to the Current Medication list given to you today.  *If you need a refill on your cardiac medications before your next appointment, please call your pharmacy*   Lab Work: LIPIDS, ALT, BMET today If you have labs (blood work) drawn today and your tests are completely normal, you will receive your results only by: MyChart Message (if you have MyChart) OR A paper copy in the mail If you have any lab test that is abnormal or we need to change your treatment, we will call you to review the results.   Testing/Procedures: NONE   Follow-Up: At [facility], you and your health needs are our priority.  As part of our continuing mission to provide you with exceptional heart care, we have created designated Provider Care Teams.  These Care Teams include your primary Cardiologist (physician) and Advanced Practice Providers (APPs -  Physician Assistants and Nurse Practitioners) who all work together to provide you with the care you need, when you need it.  We recommend signing up for the patient portal called "MyChart".  Sign up information is provided on this After Visit Summary.  MyChart is used to connect with patients for Virtual Visits (Telemedicine).  Patients are able to view lab/test results, encounter notes, upcoming appointments, etc.  Non-urgent messages can be sent to your provider as well.   To learn more about what you can do with MyChart, go to [email or web address].    Your next appointment:   1 year(s)  The format for your next appointment:   In Person  Provider:   Kristeen Miss, MD {     Important Information About Sugar

## 2022-07-27 ENCOUNTER — Other Ambulatory Visit: Payer: Self-pay | Admitting: Physician Assistant

## 2022-10-02 ENCOUNTER — Ambulatory Visit (INDEPENDENT_AMBULATORY_CARE_PROVIDER_SITE_OTHER): Payer: Medicare HMO | Admitting: Podiatry

## 2022-10-02 DIAGNOSIS — L600 Ingrowing nail: Secondary | ICD-10-CM

## 2022-10-02 NOTE — Progress Notes (Signed)
Subjective:  Patient ID: Nicole Dean, female    DOB: 05-Sep-1941,  MRN: [id number]  Chief Complaint  Patient presents with   Ingrown Toenail    right great toenail ingrown    81 y.o. female presents with concern for pain at the lateral border of the right great toenail.  She has pain with pressure on the area.  She is previously tried to cut out the ingrown portion but she has not been successful at getting the area fully resolved.  Denies any drainage from the area.  Past Medical History:  Diagnosis Date   AAA (abdominal aortic aneurysm) (HCC)    VVS- following    Allergy    Anemia    Anxiety    Arthritis    hands   Blood transfusion without reported diagnosis    Cataract    removed bilateral    Cholecystitis 10/2016   Coronary artery disease    Depression    Gastric ulcer 05/27/2017   Dr Adela Lank   GERD (gastroesophageal reflux disease)    History of bronchitis    History of kidney stones    History of pneumonia    Hyperlipidemia    Hypertension    Numbness and tingling of right leg    Shortness of breath dyspnea    sometimes even with resting; pt. denies since heart surgery   Wears glasses     Allergies  Allergen Reactions   Codeine Nausea And Vomiting    Severe, ended up in ED   Penicillins Nausea Only and Other (See Comments)    Has patient had a PCN reaction causing immediate rash, facial/tongue/throat swelling, SOB or lightheadedness with hypotension: Yes Has patient had a PCN reaction causing severe rash involving mucus membranes or skin necrosis: No Has patient had a PCN reaction that required hospitalization No Has patient had a PCN reaction occurring within the last 10 years: No If all of the above answers are "NO", then may proceed with Cephalosporin use.     ROS: Negative except as per HPI above  Objective:  General: AAO x3, NAD  Dermatological: Incurvation is present along the lateral nail border of the right great toe. There is localized  edema without any erythema or increase in warmth around the nail border. There is no drainage or pus. There is no ascending cellulitis. No malodor. No open lesions or pre-ulcerative lesions.    Vascular:  Dorsalis Pedis artery and Posterior Tibial artery pedal pulses are 2/4 bilateral.  Capillary fill time < 3 sec to all digits.   Neruologic: Grossly intact via light touch bilateral. Protective threshold intact to all sites bilateral.   Musculoskeletal: No gross boney pedal deformities bilateral. No pain, crepitus, or limitation noted with foot and ankle range of motion bilateral. Muscular strength 5/5 in all groups tested bilateral.  Gait: Unassisted, Nonantalgic.   Assessment:   1. Ingrown nail of great toe of right foot      Plan:  Patient was evaluated and treated and all questions answered.  Ingrown Nail, right hallux lateral border -Patient elects to proceed with minor surgery to remove ingrown toenail today. Consent reviewed and signed by patient. -Ingrown nail excised. See procedure note. -Educated on post-procedure care including soaking. Written instructions provided and reviewed. -Patient to follow up in 2 weeks for nail check.  Procedure: Excision of Ingrown Toenail Location: Right 1st toe lateral nail borders. Anesthesia: Lidocaine 1% plain; 1.5 mL and Marcaine 0.5% plain; 1.5 mL, digital block. Skin Prep: Betadine. Dressing:  Silvadene; telfa; dry, sterile, compression dressing. Technique: Following skin prep, the toe was exsanguinated and a tourniquet was secured at the base of the toe. The affected nail border was freed, split with a nail splitter, and excised. Chemical matrixectomy was then performed with phenol and irrigated out with alcohol. The tourniquet was then removed and sterile dressing applied. Disposition: Patient tolerated procedure well. Patient to return in 2 weeks for follow-up.    Return in about 2 weeks (around 10/16/2022) for Follow up R hallux  nail.          Everitt Amber, DPM [facility]

## 2022-10-02 NOTE — Patient Instructions (Signed)

## 2022-10-06 ENCOUNTER — Telehealth: Payer: Self-pay | Admitting: *Deleted

## 2022-10-06 NOTE — Telephone Encounter (Signed)
Called patient giving instructions /recommendations per physician, verbalized understanding .

## 2022-10-06 NOTE — Telephone Encounter (Signed)
Patient is calling because her procedural toe is sore to touch, red, swollen, is following instructions for after care, please advise.

## 2022-10-09 ENCOUNTER — Ambulatory Visit: Admitting: Podiatry

## 2022-10-16 ENCOUNTER — Ambulatory Visit (INDEPENDENT_AMBULATORY_CARE_PROVIDER_SITE_OTHER): Payer: Medicare HMO | Admitting: Podiatry

## 2022-10-16 DIAGNOSIS — L600 Ingrowing nail: Secondary | ICD-10-CM

## 2022-10-16 NOTE — Progress Notes (Signed)
Subjective: Nicole Dean is a 81 y.o. female returns to office today for follow up evaluation after having right hallux lateral border nail ingrown removal with phenol and alcohol matrixectomy approximately 2 weeks ago. Patient has been soaking using Epsom salt and applying topical antibiotic covered with bandaid daily. Patient denies fevers, chills, nausea, vomiting. Denies any calf pain, chest pain, SOB.  She reports she is having continued pain in the area and worried that it is not healing appropriately.  Objective:  Vitals: Reviewed  General: Well developed, nourished, in no acute distress, alert and oriented x3   Dermatology: Skin is warm, dry and supple bilateral.  Right hallux nail border appears to be clean, dry, with mild granular tissue and surrounding scab. There is no surrounding erythema, edema, drainage/purulence. The remaining nails appear unremarkable at this time. There are no other lesions or other signs of infection present.  Neurovascular status: Intact. No lower extremity swelling; No pain with calf compression bilateral.  Musculoskeletal: Mild tenderness palpation to the right hallux nail fold(s) though improved from prior. Muscular strength within normal limits bilateral.   Assesement and Plan: S/p phenol and alcohol matrixectomy to the right hallux nail lateral border, still continue to heal not fully healed at this point time.  Residual irritation related to unhealed ingrown removal site versus residual ingrownor infection.  -Continue soaking in epsom salts twice a day followed by antibiotic ointment and a band-aid. Can leave uncovered at night. Continue this until completely healed.  -If the area has not healed in 2 weeks, call the office for follow-up appointment, or sooner if any problems arise.  -Monitor for any signs/symptoms of infection. Call the office immediately if any occur or go directly to the emergency room. Call with any questions/concerns.        Corinna Gab, DPM [facility]                   10/16/2022

## 2022-12-12 DIAGNOSIS — M8589 Other specified disorders of bone density and structure, multiple sites: Secondary | ICD-10-CM | POA: Insufficient documentation

## 2022-12-12 DIAGNOSIS — Z8673 Personal history of transient ischemic attack (TIA), and cerebral infarction without residual deficits: Secondary | ICD-10-CM | POA: Insufficient documentation

## 2023-06-22 ENCOUNTER — Encounter (INDEPENDENT_AMBULATORY_CARE_PROVIDER_SITE_OTHER): Admitting: Ophthalmology

## 2023-07-22 ENCOUNTER — Encounter (INDEPENDENT_AMBULATORY_CARE_PROVIDER_SITE_OTHER): Payer: Medicare HMO | Admitting: Ophthalmology

## 2023-07-22 DIAGNOSIS — H353122 Nonexudative age-related macular degeneration, left eye, intermediate dry stage: Secondary | ICD-10-CM

## 2023-07-22 DIAGNOSIS — I10 Essential (primary) hypertension: Secondary | ICD-10-CM

## 2023-07-22 DIAGNOSIS — H35033 Hypertensive retinopathy, bilateral: Secondary | ICD-10-CM | POA: Diagnosis not present

## 2023-07-22 DIAGNOSIS — H43813 Vitreous degeneration, bilateral: Secondary | ICD-10-CM

## 2023-07-22 DIAGNOSIS — H353114 Nonexudative age-related macular degeneration, right eye, advanced atrophic with subfoveal involvement: Secondary | ICD-10-CM | POA: Diagnosis not present

## 2023-07-29 ENCOUNTER — Other Ambulatory Visit: Payer: Self-pay | Admitting: Cardiovascular Disease

## 2023-09-07 ENCOUNTER — Encounter: Payer: Self-pay | Admitting: Cardiovascular Disease

## 2023-09-07 NOTE — Progress Notes (Unsigned)
Cardiology Office Note   Date:  09/08/2023   ID:  Nicole Dean, Nicole Dean 08-23-1941, MRN [id number]  PCP:  Gwenlyn Found, MD  Cardiologist:   Kristeen Miss, MD   Chief Complaint  Patient presents with   Coronary Artery Disease         Hypertension          Problem List 1. CAD - s/p CABG  04/04/16  Left internal mammary graft to the LAD Right internal mammary graft to the PDA SVG to diagonal        SVG to OM 2.  AAA - s/p repair with Ao- bi fem graft. 07/22/16 [name] 2. Essential HTN 3. Hyperlipidemia    Previous notes:  Nicole Dean is a 82 y.o. female who presents for pre-op eval prior to AAA repair Was seen with daughter , [name] .  She has known about this AAA for a while,   Was offered repair but she declined - too much going on Recently found that the AAA had enlarged Does not have any abdominal pain  - has bilateral groin pain with walking   Has leg pain and weakness with climbing stairs.  No real CP or dyspnea   Former smoker - quit 17 year ago .   June 04, 2016: She has had CABG by Dr. Laneta Simmers on May 5. She appears to be doing quite well. No CP , breathing is good.  She saw Carlean Jews. PA  on May 30. Her HCTZ was stopped at that time but Desarae developed significant leg edema. The HCTZ has been restarted and she seems to be doing quite well.  Still has not had herr AAA repaired yet  Dec. 8, 2017:  Nicole Dean is seen with son, [name] .  CABG in May Had AAA repair in Aug Gall bladder surgery Nov. 15, 2017 Nicole Dean )  Had  a wound vac .   Still draining.     February 23, 2017:  Overall doing well .   BP is well controlled. Still healing from her GB surgery  No CP ,  Mar 31, 2018:  Nicole Dean is seen for follow-up of her coronary artery disease, abdominal aortic aneurysm repair, hyperlipidemia, hypertension. Doing well. Getting some exercise  Still has some tenderness in her gall bladder surgery scar .  Had to heal secondarily   June 21, 2019:  Nicole Dean is seen today follow-up for follow-up of her coronary artery disease, abdominal aortic aneurysm repair, hyperlipidemia, hypertension.  Seems to be getting along well now .  Wt today is 135 lbs.    July 04, 2020: Nicole Dean is seen today for follow-up of her coronary artery bypass grafting (May, 2017), abdominal aortic aneurysm repair (August, 2017) hypertension, hyperlipidemia.  No cp,   Some dyspnea,  Usus albuterol on occasion .  Walks regularly ,    Aug. 14, 2023 Nicole Dean is seen for follow up of her CAD, CABG, AAA repiar, HTN, HLD  Doing well  No cp, no dysnea   Llipid panel from April 2022 - [facility] system LDL is 91 Total cholesterol is 157 Triglyceride level is 222 HDL is 45.   Oct. 8, 2024 Nicole Dean is seen for follow up of her CAD, CABG, AAA, HTN, HLD  See with daughter , [name]  Getting some exercise No cp, no dyspnea  No syncope, or near syncope  No weakness.   HR is slow, Will DC coreg    Past Medical History:  Diagnosis Date  AAA (abdominal aortic aneurysm) (HCC)    VVS- following    Allergy    Anemia    Anxiety    Arthritis    hands   Blood transfusion without reported diagnosis    Cataract    removed bilateral    Cholecystitis 10/2016   Coronary artery disease    Depression    Gastric ulcer 05/27/2017   Dr Adela Lank   GERD (gastroesophageal reflux disease)    History of bronchitis    History of kidney stones    History of pneumonia    Hyperlipidemia    Hypertension    Numbness and tingling of right leg    Shortness of breath dyspnea    sometimes even with resting; pt. denies since heart surgery   Wears glasses     Past Surgical History:  Procedure Laterality Date   ABDOMINAL AORTIC ANEURYSM REPAIR N/A 07/22/2016   Procedure: JUXTARENAL  ABDOMINAL AORTIC ANEURYSM REPAIR;  Surgeon: Chuck Hint, MD;  Location: [facility] OR;  Service: Vascular;  Laterality: N/A;   AORTA - BILATERAL FEMORAL ARTERY BYPASS GRAFT N/A 07/22/2016   Procedure:  AORTA BIFEMORAL BYPASS GRAFT;  Surgeon: Chuck Hint, MD;  Location: [facility] OR;  Service: Vascular;  Laterality: N/A;   CABG x 4     CARDIAC CATHETERIZATION N/A 03/31/2016   Procedure: Left Heart Cath and Coronary Angiography;  Surgeon: Peter M Swaziland, MD;  Location: [facility] INVASIVE CV LAB;  Service: Cardiovascular;  Laterality: N/A;   CHOLECYSTECTOMY N/A 10/15/2016   Procedure: LAPAROSCOPIC CONVERTED TO OPEN CHOLECYSTECTOMY;  Surgeon: Claud Kelp, MD;  Location: [facility] OR;  Service: General;  Laterality: N/A;   CHOLECYSTECTOMY, LAPAROSCOPIC  10/15/2016   COLONOSCOPY     CORONARY ARTERY BYPASS GRAFT N/A 04/04/2016   Procedure: CORONARY ARTERY BYPASS GRAFTING (CABG) x 4 using bilateral internal mammary arteries and right saphenous vein harvested by endovein;  Surgeon: Alleen Borne, MD;  Location: [facility] OR;  Service: Open Heart Surgery;  Laterality: N/A;   CYSTOSCOPY W/ URETERAL STENT PLACEMENT Right 07/15/2021   Procedure: CYSTOSCOPY WITH RETROGRADE PYELOGRAM/URETERAL STENT PLACEMENT;  Surgeon: Rene Paci, MD;  Location: [facility] OR;  Service: Urology;  Laterality: Right;   CYSTOSCOPY/URETEROSCOPY/HOLMIUM LASER/STENT PLACEMENT Right 07/31/2021   Procedure: CYSTOSCOPY/URETEROSCOPY/HOLMIUM LASER/STENT PLACEMENT;  Surgeon: Rene Paci, MD;  Location: [facility] ORS;  Service: Urology;  Laterality: Right;  ONLY NEEDS 45 MIN   EYE SURGERY Bilateral    cataracts removed./ IOL   PERIPHERAL VASCULAR CATHETERIZATION N/A 03/03/2016   Procedure: Abdominal Aortogram w/Lower Extremity;  Surgeon: Chuck Hint, MD;  Location: [facility] INVASIVE CV LAB;  Service: Cardiovascular;  Laterality: N/A;   REPAIR ILIAC ARTERY Right 07/22/2016   Procedure: RIGHT COMMON ILIAC ANEURYSM REPAIR;  Surgeon: Chuck Hint, MD;  Location: [facility] OR;  Service: Vascular;  Laterality: Right;   SHOULDER ARTHROSCOPY W/ ROTATOR CUFF REPAIR Right    TEE WITHOUT CARDIOVERSION N/A 04/04/2016   Procedure: TRANSESOPHAGEAL  ECHOCARDIOGRAM (TEE);  Surgeon: Alleen Borne, MD;  Location: [facility] OR;  Service: Open Heart Surgery;  Laterality: N/A;   UPPER GASTROINTESTINAL ENDOSCOPY       Current Outpatient Medications  Medication Sig Dispense Refill   acetaminophen (TYLENOL) 325 MG tablet Take 2 tablets (650 mg total) by mouth every 4 (four) hours as needed for mild pain, moderate pain or fever.     aspirin EC (ASPIRIN LOW DOSE) 81 MG tablet TAKE 1 TABLET (81 MG TOTAL) BY MOUTH DAILY. SWALLOW WHOLE. 90 tablet 0  carisoprodol (SOMA) 350 MG tablet Take by mouth.     enalapril (VASOTEC) 20 MG tablet Take 20 mg by mouth daily.     hydrochlorothiazide (HYDRODIURIL) 25 MG tablet Take 25 mg by mouth daily.     LORazepam (ATIVAN) 1 MG tablet Take 1 mg by mouth 2 (two) times daily.     Multiple Vitamins-Iron (MULTIVITAMIN/IRON PO) Take 1 tablet by mouth daily.     Multiple Vitamins-Minerals (PRESERVISION AREDS 2 PO) Take 1 capsule by mouth in the morning and at bedtime.     omeprazole (PRILOSEC) 40 MG capsule Take 40 mg by mouth daily.     rosuvastatin (CRESTOR) 20 MG tablet Take 1 tablet (20 mg total) by mouth daily. 90 tablet 3   senna (SENOKOT) 8.6 MG TABS tablet Take 1 tablet (8.6 mg total) by mouth 2 (two) times daily. 30 each 0   sertraline (ZOLOFT) 100 MG tablet Take 200 mg by mouth daily.     traZODone (DESYREL) 100 MG tablet Take 100 mg by mouth at bedtime.     triamcinolone ointment (KENALOG) 0.1 % Apply 1 application  topically 3 (three) times daily as needed (poison oak).     albuterol (VENTOLIN HFA) 108 (90 Base) MCG/ACT inhaler Inhale 1 puff into the lungs every 6 (six) hours as needed for shortness of breath. (Patient not taking: Reported on 07/14/2022)     ketorolac (TORADOL) 10 MG tablet Take 1 tablet (10 mg total) by mouth every 6 (six) hours as needed. (Patient not taking: Reported on 07/14/2022) 20 tablet 0   oxybutynin (DITROPAN) 5 MG tablet Take 5 mg by mouth 3 (three) times daily. (Patient not taking:  Reported on 09/08/2023)     oxyCODONE-acetaminophen (PERCOCET/ROXICET) 5-325 MG tablet Take 1 tablet by mouth every 4 (four) hours as needed. (Patient not taking: Reported on 07/14/2022) 12 tablet 0   No current facility-administered medications for this visit.    Allergies:   Codeine and Penicillins    Social History:  The patient  reports that she quit smoking about 27 years ago. Her smoking use included cigarettes. She started smoking about 62 years ago. She has a 52.5 pack-year smoking history. She has never used smokeless tobacco. She reports that she does not drink alcohol and does not use drugs.   Family History:  The patient's family history includes Cancer in her father; Colon polyps in her brother; Heart disease in her maternal grandfather and maternal uncle; Stomach cancer in her brother and father.    ROS:   Noted in current history, otherwise review of systems is negative.      EKG:    EKG Interpretation Date/Time:  Tuesday September 08 2023 13:30:40 EDT Ventricular Rate:  55 PR Interval:  166 QRS Duration:  100 QT Interval:  452 QTC Calculation: 432 R Axis:   76  Text Interpretation: Sinus bradycardia Incomplete right bundle branch block When compared with ECG of 14-Jul-2021 13:52, HR is slower since previous ECG Confirmed by Kristeen Miss ([id number]) on 09/08/2023 1:37:19 PM      Recent Labs: No results found for requested labs within last 365 days.    Lipid Panel    Component Value Date/Time   CHOL 160 07/14/2022 1006   TRIG 171 (H) 07/14/2022 1006   HDL 45 07/14/2022 1006   CHOLHDL 3.6 07/14/2022 1006   CHOLHDL 4.7 09/01/2012 1429   VLDL 60 (H) 09/01/2012 1429   LDLCALC 86 07/14/2022 1006   LDLDIRECT 82 03/23/2009 2152  Wt Readings from Last 3 Encounters:  09/08/23 124 lb 8 oz (56.5 kg)  07/14/22 130 lb 6.4 oz (59.1 kg)  07/24/21 124 lb (56.2 kg)      Other studies Reviewed: Additional studies/ records that were reviewed today include:  . Review of the above records demonstrates:    ASSESSMENT AND PLAN:  1.  AAA -         2. CAD :   She had an abnormal Myoview study and subsequent cardiac catheterization revealed significant coronary artery disease. She's now status post coronary artery bypass grafting in 2017 Left internal mammary graft to the LAD Right internal mammary graft to the PDA SVG to diagonal SVG to OM   3. Essential hypertension:   BP is on the low side.  HR is slow.  Will DC coreg.   4.  Hyperlipidemia :    Check lipids today , ALT, BMP     Current medicines are reviewed at length with the patient today.  The patient does not have concerns regarding medicines.  The following changes have been made:  no change  Labs/ tests ordered today include:   Orders Placed This Encounter  Procedures   ALT   Basic metabolic panel   Lipid panel   EKG 12-Lead     Disposition:        Kristeen Miss, MD  09/08/2023 3:26 PM    [facility] [address] [place], [address] [place] [address], [place]  [address] Phone: [phone]; Fax: [phone]

## 2023-09-08 ENCOUNTER — Encounter: Payer: Self-pay | Admitting: Cardiovascular Disease

## 2023-09-08 ENCOUNTER — Ambulatory Visit: Payer: Medicare HMO | Attending: Cardiovascular Disease | Admitting: Cardiovascular Disease

## 2023-09-08 VITALS — BP 102/58 | HR 55 | Ht 62.0 in | Wt 124.5 lb

## 2023-09-08 DIAGNOSIS — Z951 Presence of aortocoronary bypass graft: Secondary | ICD-10-CM | POA: Diagnosis not present

## 2023-09-08 DIAGNOSIS — E782 Mixed hyperlipidemia: Secondary | ICD-10-CM | POA: Diagnosis not present

## 2023-09-08 DIAGNOSIS — Z09 Encounter for follow-up examination after completed treatment for conditions other than malignant neoplasm: Secondary | ICD-10-CM

## 2023-09-08 NOTE — Patient Instructions (Signed)
Medication Instructions:  STOP Carvedilol/Coreg  *If you need a refill on your cardiac medications before your next appointment, please call your pharmacy*  Lab Work: Lipids,ALT, BMET today If you have labs (blood work) drawn today and your tests are completely normal, you will receive your results only by: MyChart Message (if you have MyChart) OR A paper copy in the mail If you have any lab test that is abnormal or we need to change your treatment, we will call you to review the results.  Follow-Up: At [facility], you and your health needs are our priority.  As part of our continuing mission to provide you with exceptional heart care, we have created designated Provider Care Teams.  These Care Teams include your primary Cardiologist (physician) and Advanced Practice Providers (APPs -  Physician Assistants and Nurse Practitioners) who all work together to provide you with the care you need, when you need it.  We recommend signing up for the patient portal called "MyChart".  Sign up information is provided on this After Visit Summary.  MyChart is used to connect with patients for Virtual Visits (Telemedicine).  Patients are able to view lab/test results, encounter notes, upcoming appointments, etc.  Non-urgent messages can be sent to your provider as well.   To learn more about what you can do with MyChart, go to [email or web address].    Your next appointment:   1 year(s)  Provider:   Kristeen Miss, MD

## 2023-09-09 LAB — BASIC METABOLIC PANEL
BUN/Creatinine Ratio: 25 (ref 12–28)
BUN: 23 mg/dL (ref 8–27)
CO2: 20 mmol/L (ref 20–29)
Calcium: 9.3 mg/dL (ref 8.7–10.3)
Chloride: 106 mmol/L (ref 96–106)
Creatinine, Ser: 0.93 mg/dL (ref 0.57–1.00)
Glucose: 126 mg/dL — ABNORMAL HIGH (ref 70–99)
Potassium: 4.4 mmol/L (ref 3.5–5.2)
Sodium: 141 mmol/L (ref 134–144)
eGFR: 62 mL/min/{1.73_m2} (ref >59)

## 2023-09-09 LAB — LIPID PANEL
Chol/HDL Ratio: 3 {ratio} (ref 0.0–4.4)
Cholesterol, Total: 139 mg/dL (ref 100–199)
HDL: 47 mg/dL (ref >39)
LDL Chol Calc (NIH): 66 mg/dL (ref 0–99)
Triglycerides: 153 mg/dL — ABNORMAL HIGH (ref 0–149)
VLDL Cholesterol Cal: 26 mg/dL (ref 5–40)

## 2023-09-09 LAB — ALT: ALT: 32 [IU]/L (ref 0–32)

## 2023-09-21 ENCOUNTER — Telehealth: Payer: Self-pay | Admitting: Cardiovascular Disease

## 2023-09-21 NOTE — Telephone Encounter (Signed)
Patient is returning RN's call for 10/08 lab results.  Please advise.

## 2023-09-21 NOTE — Telephone Encounter (Signed)
Returned call to patient and went over lab results again. She states she had forgotten, but didn't have any other questions.

## 2024-07-12 ENCOUNTER — Encounter: Payer: Self-pay | Admitting: Podiatry

## 2024-07-12 ENCOUNTER — Ambulatory Visit (INDEPENDENT_AMBULATORY_CARE_PROVIDER_SITE_OTHER): Admitting: Podiatry

## 2024-07-12 DIAGNOSIS — L600 Ingrowing nail: Secondary | ICD-10-CM | POA: Diagnosis not present

## 2024-07-12 MED ORDER — NEOMYCIN-POLYMYXIN-HC 3.5-10000-1 OT SOLN: OTIC | 0 refills | Status: AC

## 2024-07-12 NOTE — Patient Instructions (Signed)
 Betadine Soak Instructions  Purchase an 8 oz. bottle of BETADINE solution (Povidone)  THE DAY AFTER THE PROCEDURE  Place 1 tablespoon of betadine solution in a quart of warm tap water.  Submerge your foot or feet with outer bandage intact for the initial soak; this will allow the bandage to become moist and wet for easy lift off.  Once you remove your bandage, continue to soak in the solution for 20 minutes.  This soak should be done twice a day.  Next, remove your foot or feet from solution, blot dry the affected area and cover.  You may use a band aid large enough to cover the area or use gauze and tape.  Apply other medications to the area as directed by the doctor such as cortisporin otic solution (ear drops) or neosporin.  IF YOUR SKIN BECOMES IRRITATED WHILE USING THESE INSTRUCTIONS, IT IS OKAY TO SWITCH TO EPSOM SALTS AND WATER OR WHITE VINEGAR AND WATER.   Long Term Care Instructions-Post Nail Surgery  You have had your ingrown toenail and root treated with a chemical.  This chemical causes a burn that will drain and ooze like a blister.  This can drain for 6-8 weeks or longer.  It is important to keep this area clean, covered, and follow the soaking instructions dispensed at the time of your surgery.  This area will eventually dry and form a scab.  Once the scab forms you no longer need to soak or apply a dressing.  If at any time you experience an increase in pain, redness, swelling, or drainage, you should contact the office as soon as possible.   Epsom Salt Soak Instructions    IF SOAKING IS STILL NECESSARY, START THIS 2 WEEKS AFTER INITIAL PROCEDURE   Place 1/4 cup of epsom salts in a quart of warm tap water.  Soak your foot or feet in the solution for 20 minutes twice a day until you notice the area has dried and a scab has formed. Continue to apply other medications to the area as directed by the doctor such as polysporin, neosporin or cortisporin drops.  IF YOUR SKIN BECOMES  IRRITATED WHILE USING THESE INSTRUCTIONS, IT IS OKAY TO SWITCH TO  WHITE VINEGAR AND WATER. Or you may use antibacterial soap and water to keep the toe clean  Monitor for any signs/symptoms of infection. Call the office immediately if any occur or go directly to the emergency room. Call with any questions/concerns.

## 2024-07-13 NOTE — Progress Notes (Signed)
 She presents today states that she saw Dr. Malvin quite sometime ago he worked on this but is still tender and here she points to the fibular border of the hallux right.  Objective: Vital signs are stable alert oriented x 3.  Pulses are palpable.  Sharpey rated nail margin along the fibular border of the hallux right with tenderness.  Mild erythema no cellulitis drainage or odor.  No purulence no necrotic tissue.  Assessment: Ingrown nail fibular border hallux right.  Plan: Discussed etiology pathology and surgical therapies at this point after local anesthetic was administered the toe was prepped.  The nail was split from distal to proximal exposing the matrix by removing the nail margin.  Once the matrix was exposed 3 applications of phenol were applied 30 seconds each neutralized with isopropyl alcohol Silvadene cream Telfa pad and dressed a compressive dressing.  She was given both oral and home-going instruction for the care and soaking of the toe as well as a prescription of Cortisporin otic to be applied twice daily after soaking.  I will follow-up with her in 2 to 3 weeks.

## 2024-07-20 ENCOUNTER — Encounter (INDEPENDENT_AMBULATORY_CARE_PROVIDER_SITE_OTHER): Admitting: Ophthalmology
